# Patient Record
Sex: Female | Born: 1955
Health system: Southern US, Community
[De-identification: ages and names within clinical notes are randomized; demographics above are authoritative.]

## PROBLEM LIST (undated history)

## (undated) DIAGNOSIS — I10 Essential (primary) hypertension: Secondary | ICD-10-CM

## (undated) DIAGNOSIS — I639 Cerebral infarction, unspecified: Secondary | ICD-10-CM

## (undated) DIAGNOSIS — I5032 Chronic diastolic (congestive) heart failure: Secondary | ICD-10-CM

## (undated) DIAGNOSIS — C801 Malignant (primary) neoplasm, unspecified: Secondary | ICD-10-CM

## (undated) DIAGNOSIS — E785 Hyperlipidemia, unspecified: Secondary | ICD-10-CM

## (undated) HISTORY — PX: BREAST LUMPECTOMY: SHX2

## (undated) HISTORY — PX: TONSILLECTOMY: SUR1361

## (undated) HISTORY — DX: Chronic diastolic (congestive) heart failure: I50.32

---

## 2008-01-29 ENCOUNTER — Emergency Department (HOSPITAL_COMMUNITY): Admission: EM | Admit: 2008-01-29 | Discharge: 2008-01-29 | Payer: Self-pay | Admitting: Emergency Medicine

## 2008-11-01 ENCOUNTER — Emergency Department (HOSPITAL_BASED_OUTPATIENT_CLINIC_OR_DEPARTMENT_OTHER): Admission: EM | Admit: 2008-11-01 | Discharge: 2008-11-01 | Payer: Self-pay | Admitting: Emergency Medicine

## 2008-11-11 ENCOUNTER — Emergency Department (HOSPITAL_COMMUNITY): Admission: EM | Admit: 2008-11-11 | Discharge: 2008-11-11 | Payer: Self-pay | Admitting: Emergency Medicine

## 2011-01-11 LAB — URINALYSIS, ROUTINE W REFLEX MICROSCOPIC
Hgb urine dipstick: NEGATIVE
Ketones, ur: NEGATIVE mg/dL
Nitrite: NEGATIVE
Protein, ur: NEGATIVE mg/dL
Protein, ur: NEGATIVE mg/dL
Specific Gravity, Urine: 1.013 (ref 1.005–1.030)
Urobilinogen, UA: 0.2 mg/dL (ref 0.0–1.0)
Urobilinogen, UA: 0.2 mg/dL (ref 0.0–1.0)
pH: 5.5 (ref 5.0–8.0)

## 2011-01-11 LAB — RAPID URINE DRUG SCREEN, HOSP PERFORMED
Amphetamines: NOT DETECTED
Opiates: NOT DETECTED
Tetrahydrocannabinol: NOT DETECTED

## 2011-01-11 LAB — CBC
Hemoglobin: 12.8 g/dL (ref 12.0–15.0)
MCV: 86.3 fL (ref 78.0–100.0)
Platelets: 269 10*3/uL (ref 150–400)
RBC: 4.44 MIL/uL (ref 3.87–5.11)
RDW: 13 % (ref 11.5–15.5)
WBC: 7 10*3/uL (ref 4.0–10.5)
WBC: 7.8 10*3/uL (ref 4.0–10.5)

## 2011-01-11 LAB — BASIC METABOLIC PANEL
BUN: 10 mg/dL (ref 6–23)
Chloride: 99 mEq/L (ref 96–112)
Creatinine, Ser: 0.9 mg/dL (ref 0.4–1.2)
GFR calc non Af Amer: >60 mL/min (ref >60)

## 2011-01-11 LAB — COMPREHENSIVE METABOLIC PANEL
ALT: 18 U/L (ref 0–35)
AST: 21 U/L (ref 0–37)
Alkaline Phosphatase: 83 U/L (ref 39–117)
CO2: 32 mEq/L (ref 19–32)
GFR calc Af Amer: >60 mL/min (ref >60)
GFR calc non Af Amer: >60 mL/min (ref >60)
Glucose, Bld: 214 mg/dL — ABNORMAL HIGH (ref 70–99)
Potassium: 3.7 mEq/L (ref 3.5–5.1)
Sodium: 140 mEq/L (ref 135–145)

## 2011-01-11 LAB — LIPASE, BLOOD: Lipase: 43 U/L (ref 11–59)

## 2011-01-11 LAB — POCT TOXICOLOGY PANEL: Cocaine: POSITIVE

## 2011-01-11 LAB — DIFFERENTIAL
Basophils Absolute: 0 10*3/uL (ref 0.0–0.1)
Basophils Relative: 1 % (ref 0–1)
Eosinophils Absolute: 0.3 10*3/uL (ref 0.0–0.7)
Eosinophils Absolute: 0.5 10*3/uL (ref 0.0–0.7)
Eosinophils Relative: 6 % — ABNORMAL HIGH (ref 0–5)
Lymphs Abs: 2 10*3/uL (ref 0.7–4.0)
Monocytes Relative: 7 % (ref 3–12)
Neutrophils Relative %: 61 % (ref 43–77)
Neutrophils Relative %: 59 % (ref 43–77)

## 2011-01-11 LAB — PREGNANCY, URINE: Preg Test, Ur: NEGATIVE

## 2011-01-11 LAB — ETHANOL: Alcohol, Ethyl (B): <5 mg/dL (ref 0–10)

## 2011-07-06 ENCOUNTER — Emergency Department (HOSPITAL_COMMUNITY)
Admission: EM | Admit: 2011-07-06 | Discharge: 2011-07-06 | Disposition: A | Discharge status: Home / Self Care | Payer: Self-pay | Attending: Emergency Medicine | Admitting: Emergency Medicine

## 2011-07-06 DIAGNOSIS — R5381 Other malaise: Secondary | ICD-10-CM | POA: Insufficient documentation

## 2011-07-06 DIAGNOSIS — E7800 Pure hypercholesterolemia, unspecified: Secondary | ICD-10-CM | POA: Insufficient documentation

## 2011-07-06 DIAGNOSIS — I10 Essential (primary) hypertension: Secondary | ICD-10-CM | POA: Insufficient documentation

## 2011-07-06 DIAGNOSIS — Z794 Long term (current) use of insulin: Secondary | ICD-10-CM | POA: Insufficient documentation

## 2011-07-06 DIAGNOSIS — R5383 Other fatigue: Secondary | ICD-10-CM | POA: Insufficient documentation

## 2011-07-06 DIAGNOSIS — E119 Type 2 diabetes mellitus without complications: Secondary | ICD-10-CM | POA: Insufficient documentation

## 2011-08-21 ENCOUNTER — Encounter: Payer: Self-pay | Admitting: *Deleted

## 2011-08-21 ENCOUNTER — Emergency Department (HOSPITAL_COMMUNITY)
Admission: EM | Admit: 2011-08-21 | Discharge: 2011-08-22 | Disposition: A | Discharge status: Home / Self Care | Payer: BC Managed Care – PPO | Attending: Emergency Medicine | Admitting: Emergency Medicine

## 2011-08-21 ENCOUNTER — Other Ambulatory Visit: Payer: Self-pay

## 2011-08-21 DIAGNOSIS — E785 Hyperlipidemia, unspecified: Secondary | ICD-10-CM | POA: Insufficient documentation

## 2011-08-21 DIAGNOSIS — I10 Essential (primary) hypertension: Secondary | ICD-10-CM | POA: Insufficient documentation

## 2011-08-21 DIAGNOSIS — J45909 Unspecified asthma, uncomplicated: Secondary | ICD-10-CM | POA: Insufficient documentation

## 2011-08-21 DIAGNOSIS — E669 Obesity, unspecified: Secondary | ICD-10-CM | POA: Insufficient documentation

## 2011-08-21 DIAGNOSIS — E119 Type 2 diabetes mellitus without complications: Secondary | ICD-10-CM | POA: Insufficient documentation

## 2011-08-21 DIAGNOSIS — R079 Chest pain, unspecified: Secondary | ICD-10-CM | POA: Insufficient documentation

## 2011-08-21 HISTORY — DX: Hyperlipidemia, unspecified: E78.5

## 2011-08-21 HISTORY — DX: Essential (primary) hypertension: I10

## 2011-08-21 LAB — GLUCOSE, CAPILLARY: Glucose-Capillary: 233 mg/dL — ABNORMAL HIGH (ref 70–99)

## 2011-08-21 MED ORDER — SODIUM CHLORIDE 0.9 % IV SOLN
INTRAVENOUS | Status: DC
  Administered 2011-08-22: via INTRAVENOUS

## 2011-08-21 MED ORDER — LABETALOL HCL 5 MG/ML IV SOLN
20.0000 mg | Freq: Once | INTRAVENOUS | Status: AC
  Administered 2011-08-22: 20 mg via INTRAVENOUS
  Filled 2011-08-21: qty 4

## 2011-08-21 MED ORDER — ONDANSETRON HCL 4 MG/2ML IJ SOLN: 4.0000 mg | Freq: Once | INTRAMUSCULAR | Status: DC

## 2011-08-21 MED ORDER — MORPHINE SULFATE 4 MG/ML IJ SOLN: 4.0000 mg | Freq: Once | INTRAMUSCULAR | Status: DC

## 2011-08-21 NOTE — ED Notes (Signed)
Pt states she was at work today when she began having chest pressure and pricking pain.  Pt states the pain is over her left side.  Pt states her BP at work was 240/115.  Manual BP is 220/76.

## 2011-08-21 NOTE — ED Provider Notes (Signed)
History     CSN: [id number] Arrival date & time: 08/21/2011  9:55 PM   First MD Initiated Contact with Patient 08/21/11 2311      Chief Complaint  Patient presents with  . Chest Pain    (Consider location/radiation/quality/duration/timing/severity/associated sxs/prior treatment) Patient is a 55 y.o. female presenting with chest pain. The history is provided by the patient.  Chest Pain Primary symptoms include dizziness. Pertinent negatives for primary symptoms include no fever, no shortness of breath, no cough and no abdominal pain.  Dizziness does not occur with diaphoresis.  Pertinent negatives for associated symptoms include no diaphoresis and no numbness.    the patient is a 55 year old, female, with a history of hypertension, diabetes, and hypercholesterolemia, who presents to the emergency department stating she felt dizzy and nauseated.  She also has had back pain, which p.m. on the right side and extended bilaterally for the past several days.  She denies trauma.  She denies vomiting, fevers, chills, or urinary tract symptoms.  She has had polyuria.  She denies chest pain, shortness of breath, cough, or difficulty breathing.  She denies smoking.  She had a nurse check her blood pressure, and she noted that it was severely elevated, so she came to the emergency department for further evaluation.  Past Medical History  Diagnosis Date  . Hypertension   . Diabetes mellitus   . Hyperlipidemia   . Asthma     Past Surgical History  Procedure Date  . Tonsillectomy   . Breast lumpectomy     Family History  Problem Relation Age of Onset  . Cancer Mother   . Diabetes Mother   . Hyperlipidemia Mother   . Hypertension Mother   . Rheum arthritis Father   . Asthma Father   . Stroke Father   . Thyroid disease Father   . Thyroid disease Brother     History  Substance Use Topics  . Smoking status: Not on file  . Smokeless tobacco: Not on file  . Alcohol Use: No    OB  History    Grav Para Term Preterm Abortions TAB SAB Ect Mult Living                  Review of Systems  Constitutional: Negative for fever, chills and diaphoresis.  HENT: Negative for congestion and neck pain.   Eyes: Negative for redness and visual disturbance.  Respiratory: Negative for cough, chest tightness and shortness of breath.   Cardiovascular: Negative for chest pain.  Gastrointestinal: Negative for abdominal pain.  Genitourinary: Positive for frequency and flank pain. Negative for dysuria and decreased urine volume.  Musculoskeletal: Positive for back pain.  Skin: Negative for rash.  Neurological: Positive for dizziness. Negative for light-headedness, numbness and headaches.  Psychiatric/Behavioral: Negative for confusion.    Allergies  Review of patient's allergies indicates no known allergies.  Home Medications   Current Outpatient Rx  Name Route Sig Dispense Refill  . ATORVASTATIN CALCIUM 10 MG PO TABS Oral Take 10 mg by mouth daily.      Marland Kitchen DILTIAZEM HCL 100 MG IV SOLR Oral Take 120 mg by mouth daily.      Marland Kitchen ESCITALOPRAM OXALATE 10 MG PO TABS Oral Take 10 mg by mouth daily.      Marland Kitchen METOPROLOL TARTRATE 100 MG PO TABS Oral Take 100 mg by mouth 2 (two) times daily.        BP 215/83  Pulse 92  Temp(Src) 98 F (36.7 C) (Oral)  Resp 22  SpO2 100%  Physical Exam  Constitutional: She is oriented to person, place, and time. She appears well-developed and well-nourished.       Obese in a moderate amount of discomfort  HENT:  Head: Normocephalic and atraumatic.  Eyes: Conjunctivae are normal. Pupils are equal, round, and reactive to light.  Neck: Normal range of motion.  Cardiovascular: Normal rate, regular rhythm and normal heart sounds.   No murmur heard. Pulmonary/Chest: Effort normal and breath sounds normal. No respiratory distress. She has no wheezes. She has no rales.  Abdominal: Soft. She exhibits no distension and no mass. There is no tenderness. There is  no rebound and no guarding.       Right flank tenderness  Musculoskeletal: Normal range of motion. She exhibits no edema and no tenderness.  Neurological: She is alert and oriented to person, place, and time. No cranial nerve deficit.  Skin: Skin is warm and dry. No rash noted. No erythema.  Psychiatric: She has a normal mood and affect. Her behavior is normal.    ED Course  Procedures (including critical care time)  55 year old, female, with hypertension, diabetes, and hypercholesterolemia.  Complains of right flank pain, extending across her back for several days.  She is severely elevated blood pressure.  We will check a urinalysis, and blood chemistry, and give her IV medication to reduce her blood pressure since her systolic pressure is greater than 200.  Labs Reviewed  GLUCOSE, CAPILLARY - Abnormal; Notable for the following:    Glucose-Capillary 233 (*)    All other components within normal limits  POCT CBG MONITORING  BASIC METABOLIC PANEL  URINALYSIS, ROUTINE W REFLEX MICROSCOPIC     ED ECG REPORT   Date: 08/21/11 EKG Time: 22:19 Rate: 86  Rhythm: normal sinus rhythm,  Axis: nl  Intervals: prolonged QT  ST&T Change: none  Narrative Interpretation: nsr with prolonged qt             MDM  Hypertension        Nicholes Stairs, MD 08/22/11 0121

## 2011-08-22 LAB — URINALYSIS, ROUTINE W REFLEX MICROSCOPIC
Ketones, ur: NEGATIVE mg/dL
Nitrite: NEGATIVE
Protein, ur: NEGATIVE mg/dL
Urobilinogen, UA: 0.2 mg/dL (ref 0.0–1.0)

## 2011-08-22 LAB — BASIC METABOLIC PANEL
CO2: 31 mEq/L (ref 19–32)
Calcium: 9.9 mg/dL (ref 8.4–10.5)
Creatinine, Ser: 0.65 mg/dL (ref 0.50–1.10)
GFR calc Af Amer: >90 mL/min (ref >90)
GFR calc non Af Amer: >90 mL/min (ref >90)
Sodium: 141 mEq/L (ref 135–145)

## 2011-08-22 LAB — URINE MICROSCOPIC-ADD ON

## 2011-08-22 MED ORDER — ACETAMINOPHEN 325 MG PO TABS
650.0000 mg | ORAL_TABLET | Freq: Once | ORAL | Status: AC
  Administered 2011-08-22: 650 mg via ORAL
  Filled 2011-08-22: qty 2

## 2011-08-22 MED ORDER — LISINOPRIL 10 MG PO TABS
10.0000 mg | ORAL_TABLET | Freq: Once | ORAL | Status: AC
  Administered 2011-08-22: 10 mg via ORAL
  Filled 2011-08-22: qty 1

## 2011-08-22 MED ORDER — LISINOPRIL 20 MG PO TABS: 10.0000 mg | ORAL_TABLET | Freq: Every day | ORAL | Status: DC

## 2011-08-22 NOTE — ED Notes (Signed)
Patient denies pain and is resting comfortably.  

## 2011-08-22 NOTE — ED Notes (Signed)
-   refused MORPHINE & ZOFRAN @ present.     MD aware.

## 2011-08-22 NOTE — ED Notes (Signed)
MD at bedside. 

## 2011-08-22 NOTE — ED Notes (Signed)
Patient is resting comfortably. 

## 2012-07-29 ENCOUNTER — Emergency Department (HOSPITAL_COMMUNITY)
Admission: EM | Admit: 2012-07-29 | Discharge: 2012-07-29 | Disposition: A | Discharge status: Home / Self Care | Payer: BC Managed Care – PPO | Attending: Emergency Medicine | Admitting: Emergency Medicine

## 2012-07-29 ENCOUNTER — Encounter (HOSPITAL_COMMUNITY): Payer: Self-pay | Admitting: Emergency Medicine

## 2012-07-29 ENCOUNTER — Emergency Department (HOSPITAL_COMMUNITY): Payer: BC Managed Care – PPO

## 2012-07-29 DIAGNOSIS — S0003XA Contusion of scalp, initial encounter: Secondary | ICD-10-CM | POA: Insufficient documentation

## 2012-07-29 DIAGNOSIS — E785 Hyperlipidemia, unspecified: Secondary | ICD-10-CM | POA: Insufficient documentation

## 2012-07-29 DIAGNOSIS — Z79899 Other long term (current) drug therapy: Secondary | ICD-10-CM | POA: Insufficient documentation

## 2012-07-29 DIAGNOSIS — S0510XA Contusion of eyeball and orbital tissues, unspecified eye, initial encounter: Secondary | ICD-10-CM | POA: Insufficient documentation

## 2012-07-29 DIAGNOSIS — S0083XA Contusion of other part of head, initial encounter: Secondary | ICD-10-CM

## 2012-07-29 DIAGNOSIS — I10 Essential (primary) hypertension: Secondary | ICD-10-CM | POA: Insufficient documentation

## 2012-07-29 DIAGNOSIS — E119 Type 2 diabetes mellitus without complications: Secondary | ICD-10-CM | POA: Insufficient documentation

## 2012-07-29 DIAGNOSIS — J45909 Unspecified asthma, uncomplicated: Secondary | ICD-10-CM | POA: Insufficient documentation

## 2012-07-29 DIAGNOSIS — S0590XA Unspecified injury of unspecified eye and orbit, initial encounter: Secondary | ICD-10-CM

## 2012-07-29 MED ORDER — PROPARACAINE HCL 0.5 % OP SOLN
OPHTHALMIC | Status: AC
  Administered 2012-07-29: 12:00:00
  Filled 2012-07-29: qty 15

## 2012-07-29 MED ORDER — TETRACAINE HCL 0.5 % OP SOLN: 1.0000 [drp] | Freq: Once | OPHTHALMIC | Status: DC

## 2012-07-29 MED ORDER — HYDROCODONE-ACETAMINOPHEN 5-325 MG PO TABS: 2.0000 | ORAL_TABLET | ORAL | Status: DC | PRN

## 2012-07-29 MED ORDER — FLUORESCEIN SODIUM 1 MG OP STRP
ORAL_STRIP | OPHTHALMIC | Status: AC
  Administered 2012-07-29: 12:00:00
  Filled 2012-07-29: qty 1

## 2012-07-29 NOTE — ED Notes (Signed)
Returned from CT.
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
## 2012-07-29 NOTE — ED Provider Notes (Signed)
Medical screening examination/treatment/procedure(s) were performed by non-physician practitioner and as supervising physician I was immediately available for consultation/collaboration.   Shelda Jakes, MD 07/29/12 [phone]

## 2012-08-15 ENCOUNTER — Encounter (INDEPENDENT_AMBULATORY_CARE_PROVIDER_SITE_OTHER): Payer: BC Managed Care – PPO | Admitting: Ophthalmology

## 2012-10-08 ENCOUNTER — Emergency Department (HOSPITAL_COMMUNITY): Payer: BC Managed Care – PPO

## 2012-10-08 ENCOUNTER — Emergency Department (HOSPITAL_COMMUNITY)
Admission: EM | Admit: 2012-10-08 | Discharge: 2012-10-08 | Disposition: A | Discharge status: Home / Self Care | Payer: BC Managed Care – PPO | Attending: Emergency Medicine | Admitting: Emergency Medicine

## 2012-10-08 ENCOUNTER — Encounter (HOSPITAL_COMMUNITY): Payer: Self-pay | Admitting: Emergency Medicine

## 2012-10-08 DIAGNOSIS — R079 Chest pain, unspecified: Secondary | ICD-10-CM

## 2012-10-08 DIAGNOSIS — J45909 Unspecified asthma, uncomplicated: Secondary | ICD-10-CM | POA: Insufficient documentation

## 2012-10-08 DIAGNOSIS — I10 Essential (primary) hypertension: Secondary | ICD-10-CM | POA: Insufficient documentation

## 2012-10-08 DIAGNOSIS — R0789 Other chest pain: Secondary | ICD-10-CM | POA: Insufficient documentation

## 2012-10-08 DIAGNOSIS — E785 Hyperlipidemia, unspecified: Secondary | ICD-10-CM | POA: Insufficient documentation

## 2012-10-08 DIAGNOSIS — E119 Type 2 diabetes mellitus without complications: Secondary | ICD-10-CM | POA: Insufficient documentation

## 2012-10-08 DIAGNOSIS — Z794 Long term (current) use of insulin: Secondary | ICD-10-CM | POA: Insufficient documentation

## 2012-10-08 DIAGNOSIS — Z79899 Other long term (current) drug therapy: Secondary | ICD-10-CM | POA: Insufficient documentation

## 2012-10-08 LAB — CBC WITH DIFFERENTIAL/PLATELET
Basophils Absolute: 0.1 10*3/uL (ref 0.0–0.1)
Basophils Relative: 1 % (ref 0–1)
Eosinophils Absolute: 0.7 10*3/uL (ref 0.0–0.7)
Hemoglobin: 13.9 g/dL (ref 12.0–15.0)
MCH: 28.5 pg (ref 26.0–34.0)
MCHC: 32.7 g/dL (ref 30.0–36.0)
Neutro Abs: 6.7 10*3/uL (ref 1.7–7.7)
Neutrophils Relative %: 65 % (ref 43–77)
Platelets: 322 10*3/uL (ref 150–400)

## 2012-10-08 LAB — COMPREHENSIVE METABOLIC PANEL
ALT: 9 U/L (ref 0–35)
AST: 13 U/L (ref 0–37)
Albumin: 3.8 g/dL (ref 3.5–5.2)
Alkaline Phosphatase: 102 U/L (ref 39–117)
Chloride: 96 mEq/L (ref 96–112)
Potassium: 3.5 mEq/L (ref 3.5–5.1)
Sodium: 138 mEq/L (ref 135–145)
Total Bilirubin: 0.3 mg/dL (ref 0.3–1.2)
Total Protein: 7.8 g/dL (ref 6.0–8.3)

## 2012-10-08 LAB — RAPID URINE DRUG SCREEN, HOSP PERFORMED
Amphetamines: NOT DETECTED
Barbiturates: NOT DETECTED
Benzodiazepines: NOT DETECTED
Cocaine: NOT DETECTED
Tetrahydrocannabinol: NOT DETECTED

## 2012-10-08 LAB — TROPONIN I
Troponin I: <0.3 ng/mL (ref <0.30)
Troponin I: <0.3 ng/mL (ref <0.30)

## 2012-10-08 MED ORDER — IBUPROFEN 800 MG PO TABS: 800.0000 mg | ORAL_TABLET | Freq: Three times a day (TID) | ORAL | Status: DC

## 2012-10-08 MED ORDER — KETOROLAC TROMETHAMINE 30 MG/ML IJ SOLN
30.0000 mg | Freq: Once | INTRAMUSCULAR | Status: AC
  Administered 2012-10-08: 30 mg via INTRAVENOUS
  Filled 2012-10-08: qty 1

## 2012-10-08 NOTE — ED Notes (Signed)
Pt states started having R sided chest pain radiating to back, has worsened over the past couple days, states hurts to move or breath deep now, denies injury or muscle strain, states went to urgent care and was sent here to r/o PE. Denies shortness of breath.

## 2012-10-08 NOTE — ED Notes (Signed)
Pt reports pain on right side and mid back that is 10/10 at times. Pt reports that it hurts worse when she takes a deep breath and "when I move certain ways". Pt denies fall or recent injury.

## 2012-10-08 NOTE — ED Provider Notes (Signed)
History     CSN: [id number]  Arrival date & time 10/08/12  1408   First MD Initiated Contact with Patient 10/08/12 1614      No chief complaint on file.   (Consider location/radiation/quality/duration/timing/severity/associated sxs/prior treatment) The history is provided by the patient.  Carrina Schoenberger is a 57 y.o. female history of hypertension, diabetes here presenting with right-sided chest pain. Sharp intermittent chest pain for the last 3 days. It is worse with movement and worse when she takes a deep breath but not exertional. 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 No recent travel history of DVT and she is not on oral contraceptives. She went to urgent care was sent for rule out PE. She does use cocaine occasionally and last use was a week ago but she denies any smoking.    Past Medical History  Diagnosis Date  . Hypertension   . Diabetes mellitus   . Hyperlipidemia   . Asthma     Past Surgical History  Procedure Date  . Tonsillectomy   . Breast lumpectomy     Family History  Problem Relation Age of Onset  . Cancer Mother   . Diabetes Mother   . Hyperlipidemia Mother   . Hypertension Mother   . Rheum arthritis Father   . Asthma Father   . Stroke Father   . Thyroid disease Father   . Thyroid disease Brother     History  Substance Use Topics  . Smoking status: Not on file  . Smokeless tobacco: Not on file  . Alcohol Use: No    OB History    Grav Para Term Preterm Abortions TAB SAB Ect Mult Living                  Review of Systems  Cardiovascular: Positive for chest pain.  All other systems reviewed and are negative.    Allergies  Shrimp  Home Medications   Current Outpatient Rx  Name  Route  Sig  Dispense  Refill  . ALBUTEROL SULFATE HFA 108 (90 BASE) MCG/ACT IN AERS   Inhalation   Inhale 2 puffs into the lungs every 6 (six) hours as needed. Shortness of breath         . CITALOPRAM HYDROBROMIDE 40 MG PO TABS   Oral   Take 40 mg by mouth daily.         Marland Kitchen DILTIAZEM HCL ER 240 MG PO CP24   Oral   Take 240 mg by mouth daily.         . INSULIN ASPART 100 UNIT/ML [place] SOLN   Subcutaneous   Inject 15 Units into the skin 2 (two) times daily.         Marland Kitchen LOSARTAN POTASSIUM-HCTZ 100-25 MG PO TABS   Oral   Take 1 tablet by mouth daily.         Marland Kitchen METFORMIN HCL 500 MG PO TABS   Oral   Take 500 mg by mouth 2 (two) times daily with a meal. Patient is currently  Out of this medication            BP 169/77  Pulse 69  Temp 98.8 F (37.1 C) (Oral)  Resp 16  SpO2 98%  Physical Exam  Nursing note and vitals reviewed. Constitutional: She is oriented to person, place, and time. She appears well-developed and well-nourished.       Slightly uncomfortable   HENT:  Head: Normocephalic.  Mouth/Throat: Oropharynx is clear  and moist.  Eyes: Conjunctivae normal are normal. Pupils are equal, round, and reactive to light.  Neck: Normal range of motion. Neck supple.  Cardiovascular: Normal rate, regular rhythm and normal heart sounds.   Pulmonary/Chest: Effort normal and breath sounds normal. No respiratory distress. She has no wheezes. She has no rales.  Abdominal: Soft. Bowel sounds are normal. She exhibits no distension. There is no tenderness. There is no rebound.  Musculoskeletal: Normal range of motion. She exhibits no edema and no tenderness.  Neurological: She is alert and oriented to person, place, and time.  Skin: Skin is warm and dry.  Psychiatric: She has a normal mood and affect. Her behavior is normal. Judgment and thought content normal.    ED Course  Procedures (including critical care time)  Labs Reviewed  CBC WITH DIFFERENTIAL - Abnormal; Notable for the following:    Eosinophils Relative 7 (*)     All other components within normal limits  COMPREHENSIVE METABOLIC PANEL - Abnormal; Notable for the following:    Glucose, Bld 125 (*)     GFR calc non Af Amer 76 (*)     GFR calc  Af Amer 88 (*)     All other components within normal limits  TROPONIN I  D-DIMER, QUANTITATIVE  URINE RAPID DRUG SCREEN (HOSP PERFORMED)  TROPONIN I   Dg Chest 2 View  10/08/2012  *RADIOLOGY REPORT*  Clinical Data: Chest pain, shortness of breath  CHEST - 2 VIEW  Comparison: 01/29/2008  Findings: Apparent minimal increase in size of cardiac silhouette and mediastinal contours is favored to be secondary to slightly decreased lung volumes.  No focal parenchymal opacity.  No pleural effusion or pneumothorax.  Unchanged bones.  IMPRESSION: No acute cardiopulmonary disease.   Original Report Authenticated By: Tacey Ruiz, MD      No diagnosis found.   Date: 10/08/2012  Rate:77  Rhythm: normal sinus rhythm  QRS Axis: normal  Intervals: normal  ST/T Wave abnormalities: nonspecific ST changes  Conduction Disutrbances:none  Narrative Interpretation:   Old EKG Reviewed: unchanged    MDM  Elle Vezina is a 57 y.o. female here with R sided chest pain. Low risk for PE but it is pleuritic so will get d-dimer. Pain unlikely ACS but given hx of cocaine will get trop x 2. I think most likely her pain is MSK in origin.   8:43 PM Pain improved with toradol. D-dimer neg. Trop neg x 2. CXR unremarkable. Will d/c home on motrin 800mg  every 6 hrs prn. Will recommend outpatient f/u.         Richardean Canal, MD 10/08/12 2044

## 2012-12-14 ENCOUNTER — Emergency Department (HOSPITAL_COMMUNITY)
Admission: EM | Admit: 2012-12-14 | Discharge: 2012-12-14 | Disposition: A | Discharge status: Home / Self Care | Payer: BC Managed Care – PPO | Attending: Emergency Medicine | Admitting: Emergency Medicine

## 2012-12-14 ENCOUNTER — Emergency Department (HOSPITAL_COMMUNITY): Payer: BC Managed Care – PPO

## 2012-12-14 ENCOUNTER — Encounter (HOSPITAL_COMMUNITY): Payer: Self-pay | Admitting: *Deleted

## 2012-12-14 DIAGNOSIS — Z8639 Personal history of other endocrine, nutritional and metabolic disease: Secondary | ICD-10-CM | POA: Insufficient documentation

## 2012-12-14 DIAGNOSIS — L02619 Cutaneous abscess of unspecified foot: Secondary | ICD-10-CM | POA: Insufficient documentation

## 2012-12-14 DIAGNOSIS — J45909 Unspecified asthma, uncomplicated: Secondary | ICD-10-CM | POA: Insufficient documentation

## 2012-12-14 DIAGNOSIS — Z862 Personal history of diseases of the blood and blood-forming organs and certain disorders involving the immune mechanism: Secondary | ICD-10-CM | POA: Insufficient documentation

## 2012-12-14 DIAGNOSIS — L0390 Cellulitis, unspecified: Secondary | ICD-10-CM

## 2012-12-14 DIAGNOSIS — Z79899 Other long term (current) drug therapy: Secondary | ICD-10-CM | POA: Insufficient documentation

## 2012-12-14 DIAGNOSIS — I10 Essential (primary) hypertension: Secondary | ICD-10-CM | POA: Insufficient documentation

## 2012-12-14 DIAGNOSIS — Z794 Long term (current) use of insulin: Secondary | ICD-10-CM | POA: Insufficient documentation

## 2012-12-14 DIAGNOSIS — L299 Pruritus, unspecified: Secondary | ICD-10-CM | POA: Insufficient documentation

## 2012-12-14 DIAGNOSIS — E119 Type 2 diabetes mellitus without complications: Secondary | ICD-10-CM | POA: Insufficient documentation

## 2012-12-14 LAB — URINALYSIS, ROUTINE W REFLEX MICROSCOPIC
Ketones, ur: NEGATIVE mg/dL
Leukocytes, UA: NEGATIVE
Nitrite: NEGATIVE
Protein, ur: NEGATIVE mg/dL
Urobilinogen, UA: 0.2 mg/dL (ref 0.0–1.0)

## 2012-12-14 LAB — CBC
MCH: 28.6 pg (ref 26.0–34.0)
MCHC: 32.4 g/dL (ref 30.0–36.0)
Platelets: 279 10*3/uL (ref 150–400)
RBC: 4.51 MIL/uL (ref 3.87–5.11)
RDW: 12.9 % (ref 11.5–15.5)

## 2012-12-14 LAB — BASIC METABOLIC PANEL
CO2: 30 mEq/L (ref 19–32)
Calcium: 9.4 mg/dL (ref 8.4–10.5)
GFR calc Af Amer: >90 mL/min (ref >90)
GFR calc non Af Amer: >90 mL/min (ref >90)
Sodium: 140 mEq/L (ref 135–145)

## 2012-12-14 LAB — GLUCOSE, CAPILLARY: Glucose-Capillary: 93 mg/dL (ref 70–99)

## 2012-12-14 MED ORDER — CLINDAMYCIN HCL 150 MG PO CAPS: 150.0000 mg | ORAL_CAPSULE | Freq: Four times a day (QID) | ORAL | Status: DC

## 2012-12-14 MED ORDER — VANCOMYCIN HCL IN DEXTROSE 1-5 GM/200ML-% IV SOLN
1000.0000 mg | Freq: Once | INTRAVENOUS | Status: AC
  Administered 2012-12-14: 1000 mg via INTRAVENOUS
  Filled 2012-12-14: qty 200

## 2012-12-14 NOTE — ED Notes (Signed)
CBG 93, pt given sandwich and orange juice.

## 2012-12-14 NOTE — ED Provider Notes (Signed)
History     CSN: [id number]  Arrival date & time 12/14/12  0149   First MD Initiated Contact with Patient 12/14/12 [phone]      Chief Complaint  Patient presents with  . Numbness    (Consider location/radiation/quality/duration/timing/severity/associated sxs/prior treatment) HPI Hx per PT< at work tonight, both feet have been itchy and her R foot with itching developed swelling and redness and warmth and some numbness.  She has DM but has never been told she has neuropathy. No foot wounds or ulcers, her blood sugars typically run in the 300s, tonight in the 60-80s which is unusual for her, no change in diet, no fevers, takes BP medications as prescribed. No HA, no weakness. No h/o same. No trouble ambulating.  Past Medical History  Diagnosis Date  . Hypertension   . Diabetes mellitus   . Hyperlipidemia   . Asthma     Past Surgical History  Procedure Laterality Date  . Tonsillectomy    . Breast lumpectomy      Family History  Problem Relation Age of Onset  . Cancer Mother   . Diabetes Mother   . Hyperlipidemia Mother   . Hypertension Mother   . Rheum arthritis Father   . Asthma Father   . Stroke Father   . Thyroid disease Father   . Thyroid disease Brother     History  Substance Use Topics  . Smoking status: Not on file  . Smokeless tobacco: Not on file  . Alcohol Use: No    OB History   Grav Para Term Preterm Abortions TAB SAB Ect Mult Living                  Review of Systems  Constitutional: Negative for fever and chills.  HENT: Negative for neck pain and neck stiffness.   Eyes: Negative for photophobia, pain and visual disturbance.  Respiratory: Negative for shortness of breath.   Cardiovascular: Negative for chest pain.  Gastrointestinal: Negative for nausea, vomiting and abdominal pain.  Genitourinary: Negative for dysuria.  Musculoskeletal: Negative for back pain.  Skin: Negative for rash.  Neurological: Negative for headaches.  All other systems  reviewed and are negative.    Allergies  Shrimp  Home Medications   Current Outpatient Rx  Name  Route  Sig  Dispense  Refill  . albuterol (PROVENTIL HFA;VENTOLIN HFA) 108 (90 BASE) MCG/ACT inhaler   Inhalation   Inhale 2 puffs into the lungs every 6 (six) hours as needed. Shortness of breath         . citalopram (CELEXA) 40 MG tablet   Oral   Take 40 mg by mouth daily.         Marland Kitchen diltiazem (DILACOR XR) 240 MG 24 hr capsule   Oral   Take 240 mg by mouth daily.         Marland Kitchen ibuprofen (ADVIL,MOTRIN) 800 MG tablet   Oral   Take 1 tablet (800 mg total) by mouth 3 (three) times daily.   30 tablet   0   . insulin aspart (NOVOLOG) 100 UNIT/ML injection   Subcutaneous   Inject 15 Units into the skin 2 (two) times daily.         Marland Kitchen losartan-hydrochlorothiazide (HYZAAR) 100-25 MG per tablet   Oral   Take 1 tablet by mouth daily.         . metFORMIN (GLUCOPHAGE) 500 MG tablet   Oral   Take 500 mg by mouth 2 (two) times daily with  a meal. Patient is currently  Out of this medication            BP 166/75  Pulse 78  Temp(Src) 97.8 F (36.6 C) (Oral)  Resp 24  SpO2 95%  Physical Exam  Constitutional: She is oriented to person, place, and time. She appears well-developed and well-nourished.  HENT:  Head: Normocephalic and atraumatic.  Mouth/Throat: Oropharynx is clear and moist.  Eyes: EOM are normal. Pupils are equal, round, and reactive to light.  Neck: Neck supple.  Cardiovascular: Normal rate, regular rhythm and intact distal pulses.   Pulmonary/Chest: Effort normal and breath sounds normal. No respiratory distress.  Abdominal: Soft. Bowel sounds are normal. There is no tenderness.  Musculoskeletal: Normal range of motion.  L foot dorsal aspect with swelling, erythema and inc warmth to touch. No weakness, equal dpp, sensorium to light touch intact and equal to LEs.   Neurological: She is alert and oriented to person, place, and time. She displays normal  reflexes. No cranial nerve deficit. She exhibits normal muscle tone. Coordination normal.  Skin: Skin is warm and dry.    ED Course  Procedures (including critical care time)  Results for orders placed during the hospital encounter of 12/14/12  CBC      Result Value Range   WBC 10.0  4.0 - 10.5 K/uL   RBC 4.51  3.87 - 5.11 MIL/uL   Hemoglobin 12.9  12.0 - 15.0 g/dL   HCT 16.1  09.6 - 04.5 %   MCV 88.2  78.0 - 100.0 fL   MCH 28.6  26.0 - 34.0 pg   MCHC 32.4  30.0 - 36.0 g/dL   RDW 40.9  81.1 - 91.4 %   Platelets 279  150 - 400 K/uL  BASIC METABOLIC PANEL      Result Value Range   Sodium 140  135 - 145 mEq/L   Potassium 3.7  3.5 - 5.1 mEq/L   Chloride 101  96 - 112 mEq/L   CO2 30  19 - 32 mEq/L   Glucose, Bld 87  70 - 99 mg/dL   BUN 8  6 - 23 mg/dL   Creatinine, Ser 7.82  0.50 - 1.10 mg/dL   Calcium 9.4  8.4 - 95.6 mg/dL   GFR calc non Af Amer >90  >90 mL/min   GFR calc Af Amer >90  >90 mL/min  URINALYSIS, ROUTINE W REFLEX MICROSCOPIC      Result Value Range   Color, Urine YELLOW  YELLOW   APPearance CLEAR  CLEAR   Specific Gravity, Urine 1.003 (*) 1.005 - 1.030   pH 7.5  5.0 - 8.0   Glucose, UA NEGATIVE  NEGATIVE mg/dL   Hgb urine dipstick NEGATIVE  NEGATIVE   Bilirubin Urine NEGATIVE  NEGATIVE   Ketones, ur NEGATIVE  NEGATIVE mg/dL   Protein, ur NEGATIVE  NEGATIVE mg/dL   Urobilinogen, UA 0.2  0.0 - 1.0 mg/dL   Nitrite NEGATIVE  NEGATIVE   Leukocytes, UA NEGATIVE  NEGATIVE  GLUCOSE, CAPILLARY      Result Value Range   Glucose-Capillary 93  70 - 99 mg/dL  POCT I-STAT TROPONIN I      Result Value Range   Troponin i, poc 0.01  0.00 - 0.08 ng/mL   Comment 3            Ct Head Wo Contrast  12/14/2012  *RADIOLOGY REPORT*  Clinical Data: Left leg swelling.  Left leg feels sore and numb.  CT HEAD  WITHOUT CONTRAST  Technique:  Contiguous axial images were obtained from the base of the skull through the vertex without contrast.  Comparison: 07/29/2012  Findings: The  ventricles and sulci are symmetrical without significant effacement, displacement, or dilatation. No mass effect or midline shift. No abnormal extra-axial fluid collections. The grey-white matter junction is distinct. Basal cisterns are not effaced. No acute intracranial hemorrhage. No depressed skull fractures.  No significant change in the appearance intracranial contents since previous study.  There is increasing opacification and membrane thickening in the maxillary antra, ethmoid air cells, and sphenoid sinuses since previous study.  Mastoid air cells are not opacified.  IMPRESSION: No acute intracranial abnormalities.  Increasing inflammatory changes in the paranasal sinuses.   Original Report Authenticated By: Burman Nieves, M.D.    IV ABx  7:16 AM PT feeling well and feels comfortable for d/c home plan f/u 48 hours recheck cellulitis L foot. Normal neuro exam.   MDM  L foot redness, pain and swelling  Treated for cellulitis  - no leukocytosis or fevers  VS and nursing notes reviewed      Sunnie Nielsen, MD 12/14/12 [phone]

## 2012-12-14 NOTE — ED Notes (Signed)
Pt states was bending down at work and when she stood up felt like bp elevated; developed left leg swelling; states lower left leg feels sore and numb--"feels like leg going to sleep"; strong left pedal pulse noted; states last wk both hands numb; states hands still feel tingling;

## 2012-12-15 ENCOUNTER — Emergency Department (HOSPITAL_BASED_OUTPATIENT_CLINIC_OR_DEPARTMENT_OTHER)
Admission: EM | Admit: 2012-12-15 | Discharge: 2012-12-15 | Disposition: A | Discharge status: Other Acute Inpatient Hospital | Payer: BC Managed Care – PPO | Attending: Emergency Medicine | Admitting: Emergency Medicine

## 2012-12-15 ENCOUNTER — Encounter (HOSPITAL_BASED_OUTPATIENT_CLINIC_OR_DEPARTMENT_OTHER): Payer: Self-pay | Admitting: *Deleted

## 2012-12-15 DIAGNOSIS — L02619 Cutaneous abscess of unspecified foot: Secondary | ICD-10-CM | POA: Insufficient documentation

## 2012-12-15 DIAGNOSIS — L0390 Cellulitis, unspecified: Secondary | ICD-10-CM

## 2012-12-15 DIAGNOSIS — E119 Type 2 diabetes mellitus without complications: Secondary | ICD-10-CM | POA: Insufficient documentation

## 2012-12-15 DIAGNOSIS — Z8639 Personal history of other endocrine, nutritional and metabolic disease: Secondary | ICD-10-CM | POA: Insufficient documentation

## 2012-12-15 DIAGNOSIS — Z794 Long term (current) use of insulin: Secondary | ICD-10-CM | POA: Insufficient documentation

## 2012-12-15 DIAGNOSIS — Z862 Personal history of diseases of the blood and blood-forming organs and certain disorders involving the immune mechanism: Secondary | ICD-10-CM | POA: Insufficient documentation

## 2012-12-15 DIAGNOSIS — J45909 Unspecified asthma, uncomplicated: Secondary | ICD-10-CM | POA: Insufficient documentation

## 2012-12-15 DIAGNOSIS — Z23 Encounter for immunization: Secondary | ICD-10-CM | POA: Insufficient documentation

## 2012-12-15 DIAGNOSIS — Z79899 Other long term (current) drug therapy: Secondary | ICD-10-CM | POA: Insufficient documentation

## 2012-12-15 DIAGNOSIS — I10 Essential (primary) hypertension: Secondary | ICD-10-CM | POA: Insufficient documentation

## 2012-12-15 LAB — CBC WITH DIFFERENTIAL/PLATELET
Basophils Absolute: 0 10*3/uL (ref 0.0–0.1)
Eosinophils Absolute: 0.5 10*3/uL (ref 0.0–0.7)
Eosinophils Relative: 6 % — ABNORMAL HIGH (ref 0–5)
HCT: 40.4 % (ref 36.0–46.0)
MCH: 28.8 pg (ref 26.0–34.0)
MCV: 89.4 fL (ref 78.0–100.0)
Monocytes Absolute: 0.7 10*3/uL (ref 0.1–1.0)
Platelets: 290 10*3/uL (ref 150–400)
RDW: 13 % (ref 11.5–15.5)

## 2012-12-15 LAB — BASIC METABOLIC PANEL
CO2: 30 mEq/L (ref 19–32)
Calcium: 9.2 mg/dL (ref 8.4–10.5)
Creatinine, Ser: 0.7 mg/dL (ref 0.50–1.10)
GFR calc non Af Amer: >90 mL/min (ref >90)
Glucose, Bld: 191 mg/dL — ABNORMAL HIGH (ref 70–99)
Sodium: 141 mEq/L (ref 135–145)

## 2012-12-15 MED ORDER — VANCOMYCIN HCL IN DEXTROSE 1-5 GM/200ML-% IV SOLN
1000.0000 mg | Freq: Once | INTRAVENOUS | Status: AC
  Administered 2012-12-15: 1000 mg via INTRAVENOUS
  Filled 2012-12-15: qty 200

## 2012-12-15 MED ORDER — TETANUS-DIPHTH-ACELL PERTUSSIS 5-2.5-18.5 LF-MCG/0.5 IM SUSP
0.5000 mL | Freq: Once | INTRAMUSCULAR | Status: AC
  Administered 2012-12-15: 0.5 mL via INTRAMUSCULAR
  Filled 2012-12-15: qty 0.5

## 2012-12-15 NOTE — ED Notes (Signed)
Pt seen at [facility] and dx'd with cellulitis. Foot was marked and pt told to return to ED if redness goes above line.

## 2012-12-15 NOTE — ED Notes (Signed)
All LDA's (lines, drains, airways, and wounds) were removed from documentation for discharge purposes.  At time of discharge all LDA's remained intact as were previously documented.    

## 2012-12-15 NOTE — ED Provider Notes (Signed)
History     CSN: [id number]  Arrival date & time 12/15/12  1328   First MD Initiated Contact with Patient 12/15/12 1353      Chief Complaint  Patient presents with  . Foot Pain    (Consider location/radiation/quality/duration/timing/severity/associated sxs/prior treatment) HPI Comments: Patient presents with worsening redness of her left foot. She was seen at [facility] long ED yesterday and was diagnosed with a foot cellulitis. She states it started the day before her with some itching of her foot and then it progressively got more swollen and red. She was given a dose of IV vancomycin in the emergency department yesterday and was started on clindamycin. She states that since then the redness has spread past the demarcated lines. She denies any fevers or chills. She does have a history of diabetes. She denies any past episodes of cellulitis. She does say that the foot is still itchy but is very sensitive to the touch as well.  Patient is a 57 y.o. female presenting with lower extremity pain.  Foot Pain Pertinent negatives include no chest pain, no abdominal pain, no headaches and no shortness of breath.    Past Medical History  Diagnosis Date  . Hypertension   . Diabetes mellitus   . Hyperlipidemia   . Asthma     Past Surgical History  Procedure Laterality Date  . Tonsillectomy    . Breast lumpectomy      Family History  Problem Relation Age of Onset  . Cancer Mother   . Diabetes Mother   . Hyperlipidemia Mother   . Hypertension Mother   . Rheum arthritis Father   . Asthma Father   . Stroke Father   . Thyroid disease Father   . Thyroid disease Brother     History  Substance Use Topics  . Smoking status: Not on file  . Smokeless tobacco: Not on file  . Alcohol Use: No    OB History   Grav Para Term Preterm Abortions TAB SAB Ect Mult Living                  Review of Systems  Constitutional: Negative for fever, chills, diaphoresis and fatigue.  HENT:  Negative for congestion, rhinorrhea, sneezing and neck pain.   Eyes: Negative.   Respiratory: Negative for cough, chest tightness and shortness of breath.   Cardiovascular: Negative for chest pain and leg swelling.  Gastrointestinal: Negative for nausea, vomiting, abdominal pain, diarrhea and blood in stool.  Genitourinary: Negative for frequency, hematuria, flank pain and difficulty urinating.  Musculoskeletal: Positive for joint swelling. Negative for back pain.  Skin: Positive for rash. Negative for wound.  Neurological: Negative for dizziness, speech difficulty, weakness, numbness and headaches.    Allergies  Shrimp  Home Medications   Current Outpatient Rx  Name  Route  Sig  Dispense  Refill  . albuterol (PROVENTIL HFA;VENTOLIN HFA) 108 (90 BASE) MCG/ACT inhaler   Inhalation   Inhale 2 puffs into the lungs every 6 (six) hours as needed for wheezing or shortness of breath. Shortness of breath         . amoxicillin (AMOXIL) 500 MG capsule   Oral   Take 500 mg by mouth 2 (two) times daily.         . clindamycin (CLEOCIN) 150 MG capsule   Oral   Take 1 capsule (150 mg total) by mouth every 6 (six) hours.   28 capsule   0   . diltiazem (DILACOR XR) 240 MG  24 hr capsule   Oral   Take 240 mg by mouth every morning.          . escitalopram (LEXAPRO) 20 MG tablet   Oral   Take 20 mg by mouth every morning.         Marland Kitchen HYDROcodone-acetaminophen (NORCO) 7.5-325 MG per tablet   Oral   Take 1 tablet by mouth every 6 (six) hours as needed for pain.         Marland Kitchen insulin aspart (NOVOLOG) 100 UNIT/ML injection   Subcutaneous   Inject 15 Units into the skin 2 (two) times daily.         Marland Kitchen losartan-hydrochlorothiazide (HYZAAR) 100-25 MG per tablet   Oral   Take 1 tablet by mouth every morning.          . metFORMIN (GLUCOPHAGE) 500 MG tablet   Oral   Take 500 mg by mouth 2 (two) times daily with a meal.            BP 179/77  Pulse 88  Temp(Src) 98.1 F (36.7 C)  (Oral)  Resp 20  Ht 5\' 5"  (1.651 m)  Wt 165 lb (74.844 kg)  BMI 27.46 kg/m2  SpO2 97%  Physical Exam  Constitutional: She is oriented to person, place, and time. She appears well-developed and well-nourished.  HENT:  Head: Normocephalic and atraumatic.  Eyes: Pupils are equal, round, and reactive to light.  Neck: Normal range of motion. Neck supple.  Cardiovascular: Normal rate, regular rhythm and normal heart sounds.   Pulmonary/Chest: Effort normal and breath sounds normal. No respiratory distress. She has no wheezes. She has no rales. She exhibits no tenderness.  Abdominal: Soft. Bowel sounds are normal. There is no tenderness. There is no rebound and no guarding.  Musculoskeletal: Normal range of motion. She exhibits no edema.  Patient has swelling to the left foot over the dorsum and around the ankle. It appears to be in no tissues and not the joint. There is no induration or fluctuance. It is warm to the touch and erythematous. The erythema is spreading to the lower leg past the previous marked lines. She has normal sensation in the foot normal motor function in the foot pulses are intact.  Lymphadenopathy:    She has no cervical adenopathy.  Neurological: She is alert and oriented to person, place, and time.  Skin: Skin is warm and dry. No rash noted.  Psychiatric: She has a normal mood and affect.    ED Course  Procedures (including critical care time)     1. Cellulitis       MDM  Patient with a history of diabetes presents with worsening cellulitis of the foot even while on antibiotics. I feel this point she needs to be admitted for IV antibiotics she has chosen to [facility] and I will contact the hospitalist for transfer.        Rolan Bucco, MD 12/15/12 1446

## 2012-12-21 LAB — CULTURE, BLOOD (ROUTINE X 2): Culture: NO GROWTH

## 2013-02-09 ENCOUNTER — Emergency Department (HOSPITAL_BASED_OUTPATIENT_CLINIC_OR_DEPARTMENT_OTHER): Payer: BC Managed Care – PPO

## 2013-02-09 ENCOUNTER — Emergency Department (HOSPITAL_BASED_OUTPATIENT_CLINIC_OR_DEPARTMENT_OTHER)
Admission: EM | Admit: 2013-02-09 | Discharge: 2013-02-09 | Disposition: A | Discharge status: Home / Self Care | Payer: BC Managed Care – PPO | Attending: Emergency Medicine | Admitting: Emergency Medicine

## 2013-02-09 ENCOUNTER — Encounter (HOSPITAL_BASED_OUTPATIENT_CLINIC_OR_DEPARTMENT_OTHER): Payer: Self-pay | Admitting: *Deleted

## 2013-02-09 DIAGNOSIS — K3189 Other diseases of stomach and duodenum: Secondary | ICD-10-CM | POA: Insufficient documentation

## 2013-02-09 DIAGNOSIS — R1013 Epigastric pain: Secondary | ICD-10-CM | POA: Insufficient documentation

## 2013-02-09 DIAGNOSIS — I10 Essential (primary) hypertension: Secondary | ICD-10-CM | POA: Insufficient documentation

## 2013-02-09 DIAGNOSIS — R112 Nausea with vomiting, unspecified: Secondary | ICD-10-CM | POA: Insufficient documentation

## 2013-02-09 DIAGNOSIS — Z794 Long term (current) use of insulin: Secondary | ICD-10-CM | POA: Insufficient documentation

## 2013-02-09 DIAGNOSIS — Z862 Personal history of diseases of the blood and blood-forming organs and certain disorders involving the immune mechanism: Secondary | ICD-10-CM | POA: Insufficient documentation

## 2013-02-09 DIAGNOSIS — Z79899 Other long term (current) drug therapy: Secondary | ICD-10-CM | POA: Insufficient documentation

## 2013-02-09 DIAGNOSIS — Z8639 Personal history of other endocrine, nutritional and metabolic disease: Secondary | ICD-10-CM | POA: Insufficient documentation

## 2013-02-09 DIAGNOSIS — R109 Unspecified abdominal pain: Secondary | ICD-10-CM | POA: Insufficient documentation

## 2013-02-09 DIAGNOSIS — R062 Wheezing: Secondary | ICD-10-CM | POA: Insufficient documentation

## 2013-02-09 DIAGNOSIS — E119 Type 2 diabetes mellitus without complications: Secondary | ICD-10-CM | POA: Insufficient documentation

## 2013-02-09 DIAGNOSIS — J45909 Unspecified asthma, uncomplicated: Secondary | ICD-10-CM | POA: Insufficient documentation

## 2013-02-09 LAB — URINALYSIS, ROUTINE W REFLEX MICROSCOPIC
Bilirubin Urine: NEGATIVE
Glucose, UA: >1000 mg/dL — AB
Hgb urine dipstick: NEGATIVE
Ketones, ur: NEGATIVE mg/dL
Leukocytes, UA: NEGATIVE
Nitrite: NEGATIVE
Protein, ur: NEGATIVE mg/dL
Specific Gravity, Urine: 1.013 (ref 1.005–1.030)
Urobilinogen, UA: 0.2 mg/dL (ref 0.0–1.0)
pH: 6.5 (ref 5.0–8.0)

## 2013-02-09 LAB — URINE MICROSCOPIC-ADD ON

## 2013-02-09 MED ORDER — ALBUTEROL SULFATE (5 MG/ML) 0.5% IN NEBU
INHALATION_SOLUTION | RESPIRATORY_TRACT | Status: AC
  Administered 2013-02-09: 5 mg via RESPIRATORY_TRACT
  Filled 2013-02-09: qty 1

## 2013-02-09 MED ORDER — IPRATROPIUM BROMIDE 0.02 % IN SOLN
RESPIRATORY_TRACT | Status: AC
  Administered 2013-02-09: 0.5 mg via RESPIRATORY_TRACT
  Filled 2013-02-09: qty 2.5

## 2013-02-09 MED ORDER — IPRATROPIUM BROMIDE 0.02 % IN SOLN: 0.5000 mg | Freq: Once | RESPIRATORY_TRACT | Status: AC

## 2013-02-09 MED ORDER — SUCRALFATE 1 G PO TABS: 1.0000 g | ORAL_TABLET | Freq: Four times a day (QID) | ORAL | Status: DC

## 2013-02-09 MED ORDER — FLUTICASONE-SALMETEROL 100-50 MCG/DOSE IN AEPB: 1.0000 | INHALATION_SPRAY | Freq: Every morning | RESPIRATORY_TRACT | Status: DC

## 2013-02-09 MED ORDER — ALBUTEROL SULFATE (5 MG/ML) 0.5% IN NEBU: 5.0000 mg | INHALATION_SOLUTION | Freq: Once | RESPIRATORY_TRACT | Status: AC

## 2013-02-09 MED ORDER — LANSOPRAZOLE 30 MG PO CPDR: 30.0000 mg | DELAYED_RELEASE_CAPSULE | Freq: Every day | ORAL | Status: DC

## 2013-02-09 NOTE — ED Notes (Signed)
Pt states she has been waking up with a bitter taste in her mouth for a few weeks. +nausea. Vomits ?bile sometimes. Decreased appetite. Also states she saw her Dr. And was given a sample of Advair for wheezing. "It helped, but I ran out x 1 week." No distress. Exp wheezing noted. Annabelle Harman, RRT to triage to assess.

## 2013-02-09 NOTE — ED Provider Notes (Signed)
History     CSN: [id number]  Arrival date & time 02/09/13  1310   First MD Initiated Contact with Patient 02/09/13 1411      Chief Complaint  Patient presents with  . Abdominal Pain    (Consider location/radiation/quality/duration/timing/severity/associated sxs/prior treatment) HPI Patient presents with dyspepsia. She notes that for the past few weeks she is awakened in the middle of the foul metallic taste. There is occasionally bilious vomiting as well. There are no diurnal symptoms. There is no other chest pain, no other dyspnea. No other abdominal pain, no back pain, no fevers, no chills. She denies a history of GERD or gastritis. She has been taking intermittent TUMS, other OTC medication with no relief.  Past Medical History  Diagnosis Date  . Hypertension   . Diabetes mellitus   . Hyperlipidemia   . Asthma     Past Surgical History  Procedure Laterality Date  . Tonsillectomy    . Breast lumpectomy      Family History  Problem Relation Age of Onset  . Cancer Mother   . Diabetes Mother   . Hyperlipidemia Mother   . Hypertension Mother   . Rheum arthritis Father   . Asthma Father   . Stroke Father   . Thyroid disease Father   . Thyroid disease Brother     History  Substance Use Topics  . Smoking status: Not on file  . Smokeless tobacco: Not on file  . Alcohol Use: No    OB History   Grav Para Term Preterm Abortions TAB SAB Ect Mult Living                  Review of Systems  Constitutional:       Per HPI, otherwise negative  HENT:       Per HPI, otherwise negative  Respiratory: Positive for wheezing.   Cardiovascular:       Per HPI, otherwise negative  Gastrointestinal: Positive for nausea and vomiting.  Endocrine:       Negative aside from HPI  Genitourinary:       Neg aside from HPI   Musculoskeletal:       Per HPI, otherwise negative  Skin: Negative.   Neurological: Negative for syncope.    Allergies  Shrimp  Home  Medications   Current Outpatient Rx  Name  Route  Sig  Dispense  Refill  . albuterol (PROVENTIL HFA;VENTOLIN HFA) 108 (90 BASE) MCG/ACT inhaler   Inhalation   Inhale 2 puffs into the lungs every 6 (six) hours as needed for wheezing or shortness of breath. Shortness of breath         . clindamycin (CLEOCIN) 150 MG capsule   Oral   Take 1 capsule (150 mg total) by mouth every 6 (six) hours.   28 capsule   0   . diltiazem (DILACOR XR) 240 MG 24 hr capsule   Oral   Take 240 mg by mouth every morning.          . escitalopram (LEXAPRO) 20 MG tablet   Oral   Take 20 mg by mouth every morning.         Marland Kitchen HYDROcodone-acetaminophen (NORCO) 7.5-325 MG per tablet   Oral   Take 1 tablet by mouth every 6 (six) hours as needed for pain.         Marland Kitchen insulin aspart (NOVOLOG) 100 UNIT/ML injection   Subcutaneous   Inject 15 Units into the skin 2 (two) times daily.         Marland Kitchen  losartan-hydrochlorothiazide (HYZAAR) 100-25 MG per tablet   Oral   Take 1 tablet by mouth every morning.          . metFORMIN (GLUCOPHAGE) 500 MG tablet   Oral   Take 500 mg by mouth 2 (two) times daily with a meal.            BP 171/71  Pulse 89  Temp(Src) 98.3 F (36.8 C) (Oral)  Resp 16  Ht 5\' 5"  (1.651 m)  Wt 165 lb (74.844 kg)  BMI 27.46 kg/m2  SpO2 100%  Physical Exam  Nursing note and vitals reviewed. Constitutional: She is oriented to person, place, and time. She appears well-developed and well-nourished. No distress.  HENT:  Head: Normocephalic and atraumatic.  Eyes: Conjunctivae and EOM are normal.  Cardiovascular: Normal rate and regular rhythm.   Pulmonary/Chest: Effort normal and breath sounds normal. No stridor. No respiratory distress.  Abdominal: Soft. Bowel sounds are normal. She exhibits no distension. There is no tenderness. There is no rebound and no guarding.  Musculoskeletal: She exhibits no edema.  Neurological: She is alert and oriented to person, place, and time. No  cranial nerve deficit.  Skin: Skin is warm and dry.  Psychiatric: She has a normal mood and affect.    ED Course  Procedures (including critical care time)  Labs Reviewed  GLUCOSE, CAPILLARY - Abnormal; Notable for the following:    Glucose-Capillary 228 (*)    All other components within normal limits  URINALYSIS, ROUTINE W REFLEX MICROSCOPIC - Abnormal; Notable for the following:    Glucose, UA >1000 (*)    All other components within normal limits  URINE MICROSCOPIC-ADD ON   Dg Chest 2 View  02/09/2013   *RADIOLOGY REPORT*  Clinical Data: Asthma, nausea  CHEST - 2 VIEW  Comparison: 10/08/2012  Findings: The heart and pulmonary vascularity are within normal limits.  The lungs are clear bilaterally.  No acute bony abnormality is seen.  IMPRESSION: No acute abnormality noted.   Original Report Authenticated By: Alcide Clever, M.D.     No diagnosis found.  Prior to my exam the patient had requested a breathing treatment for wheezing.  On my exam there was no wheezing present.    MDM  Patient presents with weeks of dyspepsia, nocturnal nausea, vomiting. Symptoms are suggestive of gastroesophageal issue.  The patient is in no distress on exam, afebrile with no abdominal pain.  Given his absence of distress, her absence of other complaints, a soft abdomen, the patient was discharged with a trial of PPI therapy, provided GI followup.  The patient had an additional complaint of decades long pain in her right upper quadrant that was associated with menstrual cycles, and is now monthly.  We discussed possibilities and she will consult her PMD.       Gerhard Munch, MD 02/09/13 1445

## 2013-08-07 ENCOUNTER — Emergency Department (HOSPITAL_COMMUNITY)
Admission: EM | Admit: 2013-08-07 | Discharge: 2013-08-07 | Disposition: A | Discharge status: Other Acute Inpatient Hospital | Payer: BC Managed Care – PPO | Attending: Emergency Medicine | Admitting: Emergency Medicine

## 2013-08-07 ENCOUNTER — Encounter (HOSPITAL_COMMUNITY): Payer: Self-pay | Admitting: *Deleted

## 2013-08-07 ENCOUNTER — Inpatient Hospital Stay (HOSPITAL_COMMUNITY)
Admission: AD | Admit: 2013-08-07 | Discharge: 2013-08-10 | DRG: 897 | Disposition: A | Discharge status: Home / Self Care | Payer: BC Managed Care – PPO | Source: Intra-hospital | Attending: Psychiatry | Admitting: Psychiatry

## 2013-08-07 ENCOUNTER — Encounter (HOSPITAL_COMMUNITY): Payer: Self-pay | Admitting: Emergency Medicine

## 2013-08-07 DIAGNOSIS — R45851 Suicidal ideations: Secondary | ICD-10-CM | POA: Insufficient documentation

## 2013-08-07 DIAGNOSIS — E119 Type 2 diabetes mellitus without complications: Secondary | ICD-10-CM | POA: Insufficient documentation

## 2013-08-07 DIAGNOSIS — I10 Essential (primary) hypertension: Secondary | ICD-10-CM | POA: Insufficient documentation

## 2013-08-07 DIAGNOSIS — F411 Generalized anxiety disorder: Secondary | ICD-10-CM | POA: Diagnosis present

## 2013-08-07 DIAGNOSIS — F39 Unspecified mood [affective] disorder: Secondary | ICD-10-CM | POA: Insufficient documentation

## 2013-08-07 DIAGNOSIS — J45909 Unspecified asthma, uncomplicated: Secondary | ICD-10-CM | POA: Insufficient documentation

## 2013-08-07 DIAGNOSIS — F1410 Cocaine abuse, uncomplicated: Secondary | ICD-10-CM | POA: Insufficient documentation

## 2013-08-07 DIAGNOSIS — F329 Major depressive disorder, single episode, unspecified: Secondary | ICD-10-CM | POA: Insufficient documentation

## 2013-08-07 DIAGNOSIS — E785 Hyperlipidemia, unspecified: Secondary | ICD-10-CM | POA: Diagnosis present

## 2013-08-07 DIAGNOSIS — F332 Major depressive disorder, recurrent severe without psychotic features: Secondary | ICD-10-CM | POA: Diagnosis present

## 2013-08-07 DIAGNOSIS — F1020 Alcohol dependence, uncomplicated: Principal | ICD-10-CM | POA: Diagnosis present

## 2013-08-07 DIAGNOSIS — Z79899 Other long term (current) drug therapy: Secondary | ICD-10-CM

## 2013-08-07 DIAGNOSIS — Z794 Long term (current) use of insulin: Secondary | ICD-10-CM | POA: Insufficient documentation

## 2013-08-07 DIAGNOSIS — Z3202 Encounter for pregnancy test, result negative: Secondary | ICD-10-CM | POA: Insufficient documentation

## 2013-08-07 DIAGNOSIS — F3289 Other specified depressive episodes: Secondary | ICD-10-CM | POA: Insufficient documentation

## 2013-08-07 DIAGNOSIS — F1010 Alcohol abuse, uncomplicated: Secondary | ICD-10-CM

## 2013-08-07 LAB — COMPREHENSIVE METABOLIC PANEL
Albumin: 4.3 g/dL (ref 3.5–5.2)
Alkaline Phosphatase: 93 U/L (ref 39–117)
BUN: 5 mg/dL — ABNORMAL LOW (ref 6–23)
Creatinine, Ser: 0.65 mg/dL (ref 0.50–1.10)
GFR calc Af Amer: >90 mL/min (ref >90)
Glucose, Bld: 146 mg/dL — ABNORMAL HIGH (ref 70–99)
Total Bilirubin: 0.4 mg/dL (ref 0.3–1.2)
Total Protein: 8.1 g/dL (ref 6.0–8.3)

## 2013-08-07 LAB — SALICYLATE LEVEL: Salicylate Lvl: <2 mg/dL — ABNORMAL LOW (ref 2.8–20.0)

## 2013-08-07 LAB — RAPID URINE DRUG SCREEN, HOSP PERFORMED
Barbiturates: NOT DETECTED
Benzodiazepines: NOT DETECTED
Cocaine: POSITIVE — AB

## 2013-08-07 LAB — CBC
HCT: 40.6 % (ref 36.0–46.0)
Hemoglobin: 13.7 g/dL (ref 12.0–15.0)
MCHC: 33.7 g/dL (ref 30.0–36.0)
MCV: 84.8 fL (ref 78.0–100.0)
RDW: 13.2 % (ref 11.5–15.5)

## 2013-08-07 LAB — GLUCOSE, CAPILLARY
Glucose-Capillary: 141 mg/dL — ABNORMAL HIGH (ref 70–99)
Glucose-Capillary: 202 mg/dL — ABNORMAL HIGH (ref 70–99)

## 2013-08-07 LAB — POCT PREGNANCY, URINE: Preg Test, Ur: NEGATIVE

## 2013-08-07 LAB — ETHANOL: Alcohol, Ethyl (B): 21 mg/dL — ABNORMAL HIGH (ref 0–11)

## 2013-08-07 MED ORDER — METFORMIN HCL 500 MG PO TABS: 1000.0000 mg | ORAL_TABLET | Freq: Two times a day (BID) | ORAL | Status: DC

## 2013-08-07 MED ORDER — ALBUTEROL SULFATE HFA 108 (90 BASE) MCG/ACT IN AERS
2.0000 | INHALATION_SPRAY | Freq: Four times a day (QID) | RESPIRATORY_TRACT | Status: DC | PRN
  Administered 2013-08-10: 2 via RESPIRATORY_TRACT
  Filled 2013-08-07: qty 6.7

## 2013-08-07 MED ORDER — ZOLPIDEM TARTRATE 5 MG PO TABS: 5.0000 mg | ORAL_TABLET | Freq: Every evening | ORAL | Status: DC | PRN

## 2013-08-07 MED ORDER — VITAMIN B-1 100 MG PO TABS
100.0000 mg | ORAL_TABLET | Freq: Every day | ORAL | Status: DC
  Administered 2013-08-08 – 2013-08-10 (×3): 100 mg via ORAL
  Filled 2013-08-07 (×5): qty 1

## 2013-08-07 MED ORDER — ALUM & MAG HYDROXIDE-SIMETH 200-200-20 MG/5ML PO SUSP: 30.0000 mL | ORAL | Status: DC | PRN

## 2013-08-07 MED ORDER — MAGNESIUM HYDROXIDE 400 MG/5ML PO SUSP: 30.0000 mL | Freq: Every day | ORAL | Status: DC | PRN

## 2013-08-07 MED ORDER — MOMETASONE FURO-FORMOTEROL FUM 100-5 MCG/ACT IN AERO: 2.0000 | INHALATION_SPRAY | Freq: Two times a day (BID) | RESPIRATORY_TRACT | Status: DC

## 2013-08-07 MED ORDER — LISINOPRIL-HYDROCHLOROTHIAZIDE 20-25 MG PO TABS: 1.0000 | ORAL_TABLET | Freq: Every day | ORAL | Status: DC

## 2013-08-07 MED ORDER — LOPERAMIDE HCL 2 MG PO CAPS: 2.0000 mg | ORAL_CAPSULE | ORAL | Status: DC | PRN

## 2013-08-07 MED ORDER — ACETAMINOPHEN 325 MG PO TABS: 650.0000 mg | ORAL_TABLET | Freq: Four times a day (QID) | ORAL | Status: DC | PRN

## 2013-08-07 MED ORDER — CHLORDIAZEPOXIDE HCL 25 MG PO CAPS
25.0000 mg | ORAL_CAPSULE | Freq: Three times a day (TID) | ORAL | Status: DC
  Filled 2013-08-07: qty 1

## 2013-08-07 MED ORDER — CHLORDIAZEPOXIDE HCL 25 MG PO CAPS
25.0000 mg | ORAL_CAPSULE | Freq: Four times a day (QID) | ORAL | Status: AC
  Administered 2013-08-07 – 2013-08-09 (×6): 25 mg via ORAL
  Filled 2013-08-07 (×6): qty 1

## 2013-08-07 MED ORDER — TRAZODONE HCL 50 MG PO TABS: 50.0000 mg | ORAL_TABLET | Freq: Every evening | ORAL | Status: DC | PRN

## 2013-08-07 MED ORDER — CHLORDIAZEPOXIDE HCL 25 MG PO CAPS
25.0000 mg | ORAL_CAPSULE | Freq: Four times a day (QID) | ORAL | Status: DC | PRN
  Administered 2013-08-08: 25 mg via ORAL
  Filled 2013-08-07 (×2): qty 1

## 2013-08-07 MED ORDER — LISINOPRIL 20 MG PO TABS: 20.0000 mg | ORAL_TABLET | Freq: Every day | ORAL | Status: DC

## 2013-08-07 MED ORDER — ADULT MULTIVITAMIN W/MINERALS CH: 1.0000 | ORAL_TABLET | Freq: Every day | ORAL | Status: DC

## 2013-08-07 MED ORDER — INSULIN ASPART 100 UNIT/ML ~~LOC~~ SOLN: 4.0000 [IU] | Freq: Three times a day (TID) | SUBCUTANEOUS | Status: DC

## 2013-08-07 MED ORDER — CHLORDIAZEPOXIDE HCL 25 MG PO CAPS: 25.0000 mg | ORAL_CAPSULE | ORAL | Status: DC

## 2013-08-07 MED ORDER — HYDROCHLOROTHIAZIDE 25 MG PO TABS: 25.0000 mg | ORAL_TABLET | Freq: Every day | ORAL | Status: DC

## 2013-08-07 MED ORDER — CHLORDIAZEPOXIDE HCL 25 MG PO CAPS: 25.0000 mg | ORAL_CAPSULE | Freq: Every day | ORAL | Status: DC

## 2013-08-07 MED ORDER — DILTIAZEM HCL ER 240 MG PO CP24
240.0000 mg | ORAL_CAPSULE | Freq: Every morning | ORAL | Status: DC
  Administered 2013-08-08 – 2013-08-10 (×3): 240 mg via ORAL
  Filled 2013-08-07 (×5): qty 1

## 2013-08-07 MED ORDER — ONDANSETRON HCL 4 MG PO TABS: 4.0000 mg | ORAL_TABLET | Freq: Three times a day (TID) | ORAL | Status: DC | PRN

## 2013-08-07 MED ORDER — DILTIAZEM HCL ER 240 MG PO CP24: 240.0000 mg | ORAL_CAPSULE | Freq: Every morning | ORAL | Status: DC

## 2013-08-07 MED ORDER — ONDANSETRON 4 MG PO TBDP: 4.0000 mg | ORAL_TABLET | Freq: Four times a day (QID) | ORAL | Status: DC | PRN

## 2013-08-07 MED ORDER — CHLORDIAZEPOXIDE HCL 25 MG PO CAPS: 25.0000 mg | ORAL_CAPSULE | Freq: Four times a day (QID) | ORAL | Status: DC

## 2013-08-07 MED ORDER — THIAMINE HCL 100 MG/ML IJ SOLN: 100.0000 mg | Freq: Once | INTRAMUSCULAR | Status: DC

## 2013-08-07 MED ORDER — ESCITALOPRAM OXALATE 20 MG PO TABS
20.0000 mg | ORAL_TABLET | Freq: Every morning | ORAL | Status: DC
  Administered 2013-08-08: 20 mg via ORAL
  Filled 2013-08-07 (×2): qty 1

## 2013-08-07 MED ORDER — INSULIN ASPART 100 UNIT/ML ~~LOC~~ SOLN: 15.0000 [IU] | Freq: Two times a day (BID) | SUBCUTANEOUS | Status: DC

## 2013-08-07 MED ORDER — CHLORDIAZEPOXIDE HCL 25 MG PO CAPS: 25.0000 mg | ORAL_CAPSULE | Freq: Four times a day (QID) | ORAL | Status: DC | PRN

## 2013-08-07 MED ORDER — ACETAMINOPHEN 325 MG PO TABS
650.0000 mg | ORAL_TABLET | Freq: Four times a day (QID) | ORAL | Status: DC | PRN
  Administered 2013-08-08 – 2013-08-10 (×3): 650 mg via ORAL
  Filled 2013-08-07 (×3): qty 2

## 2013-08-07 MED ORDER — HYDROXYZINE HCL 25 MG PO TABS
25.0000 mg | ORAL_TABLET | Freq: Four times a day (QID) | ORAL | Status: DC | PRN
  Administered 2013-08-07: 25 mg via ORAL
  Filled 2013-08-07: qty 1

## 2013-08-07 MED ORDER — METFORMIN HCL 500 MG PO TABS
1000.0000 mg | ORAL_TABLET | Freq: Two times a day (BID) | ORAL | Status: DC
  Administered 2013-08-08 – 2013-08-10 (×5): 1000 mg via ORAL
  Filled 2013-08-07 (×9): qty 2

## 2013-08-07 MED ORDER — INSULIN ASPART 100 UNIT/ML ~~LOC~~ SOLN: 0.0000 [IU] | Freq: Every day | SUBCUTANEOUS | Status: DC

## 2013-08-07 MED ORDER — ESCITALOPRAM OXALATE 10 MG PO TABS: 20.0000 mg | ORAL_TABLET | Freq: Every morning | ORAL | Status: DC

## 2013-08-07 MED ORDER — HYDROXYZINE HCL 25 MG PO TABS: 25.0000 mg | ORAL_TABLET | Freq: Four times a day (QID) | ORAL | Status: DC | PRN

## 2013-08-07 MED ORDER — INSULIN ASPART 100 UNIT/ML ~~LOC~~ SOLN
4.0000 [IU] | Freq: Three times a day (TID) | SUBCUTANEOUS | Status: DC
  Administered 2013-08-08 – 2013-08-10 (×8): 4 [IU] via SUBCUTANEOUS

## 2013-08-07 MED ORDER — ADULT MULTIVITAMIN W/MINERALS CH
1.0000 | ORAL_TABLET | Freq: Every day | ORAL | Status: DC
  Administered 2013-08-08 – 2013-08-10 (×3): 1 via ORAL
  Filled 2013-08-07 (×5): qty 1

## 2013-08-07 MED ORDER — CHLORDIAZEPOXIDE HCL 25 MG PO CAPS: 25.0000 mg | ORAL_CAPSULE | Freq: Three times a day (TID) | ORAL | Status: DC

## 2013-08-07 MED ORDER — ALBUTEROL SULFATE HFA 108 (90 BASE) MCG/ACT IN AERS: 2.0000 | INHALATION_SPRAY | Freq: Four times a day (QID) | RESPIRATORY_TRACT | Status: DC | PRN

## 2013-08-07 MED ORDER — LORAZEPAM 1 MG PO TABS
1.0000 mg | ORAL_TABLET | Freq: Three times a day (TID) | ORAL | Status: DC | PRN
  Administered 2013-08-07: 1 mg via ORAL
  Filled 2013-08-07: qty 1

## 2013-08-07 MED ORDER — INSULIN ASPART 100 UNIT/ML ~~LOC~~ SOLN: 0.0000 [IU] | Freq: Three times a day (TID) | SUBCUTANEOUS | Status: DC

## 2013-08-07 MED ORDER — MOMETASONE FURO-FORMOTEROL FUM 100-5 MCG/ACT IN AERO
2.0000 | INHALATION_SPRAY | Freq: Two times a day (BID) | RESPIRATORY_TRACT | Status: DC
  Administered 2013-08-08 – 2013-08-10 (×4): 2 via RESPIRATORY_TRACT
  Filled 2013-08-07: qty 8.8

## 2013-08-07 MED ORDER — INSULIN ASPART 100 UNIT/ML ~~LOC~~ SOLN
0.0000 [IU] | Freq: Three times a day (TID) | SUBCUTANEOUS | Status: DC
  Administered 2013-08-08 (×3): 2 [IU] via SUBCUTANEOUS
  Administered 2013-08-09: 3 [IU] via SUBCUTANEOUS
  Administered 2013-08-09: 5 [IU] via SUBCUTANEOUS
  Administered 2013-08-10: 2 [IU] via SUBCUTANEOUS

## 2013-08-07 MED ORDER — HYDROCHLOROTHIAZIDE 25 MG PO TABS
25.0000 mg | ORAL_TABLET | Freq: Every day | ORAL | Status: DC
  Administered 2013-08-08 – 2013-08-10 (×3): 25 mg via ORAL
  Filled 2013-08-07 (×5): qty 1

## 2013-08-07 MED ORDER — INSULIN ASPART 100 UNIT/ML ~~LOC~~ SOLN
0.0000 [IU] | Freq: Every day | SUBCUTANEOUS | Status: DC
  Administered 2013-08-07: 2 [IU] via SUBCUTANEOUS

## 2013-08-07 MED ORDER — THIAMINE HCL 100 MG/ML IJ SOLN
100.0000 mg | Freq: Once | INTRAMUSCULAR | Status: DC
Start: 2013-08-07 — End: 2013-08-10

## 2013-08-07 MED ORDER — NICOTINE 21 MG/24HR TD PT24: 21.0000 mg | MEDICATED_PATCH | Freq: Every day | TRANSDERMAL | Status: DC

## 2013-08-07 MED ORDER — LISINOPRIL 20 MG PO TABS
20.0000 mg | ORAL_TABLET | Freq: Every day | ORAL | Status: DC
  Administered 2013-08-07 – 2013-08-10 (×4): 20 mg via ORAL
  Filled 2013-08-07 (×6): qty 1

## 2013-08-07 MED ORDER — VITAMIN B-1 100 MG PO TABS: 100.0000 mg | ORAL_TABLET | Freq: Every day | ORAL | Status: DC

## 2013-08-07 MED ORDER — IBUPROFEN 200 MG PO TABS
600.0000 mg | ORAL_TABLET | Freq: Three times a day (TID) | ORAL | Status: DC | PRN
  Administered 2013-08-07: 600 mg via ORAL
  Filled 2013-08-07: qty 3

## 2013-08-07 NOTE — ED Notes (Signed)
Bed: ZD66 Expected date:  Expected time:  Means of arrival:  Comments: EMS-cocaine/etoh

## 2013-08-07 NOTE — ED Notes (Signed)
Pt provided with sandwhich and diet drink

## 2013-08-07 NOTE — ED Provider Notes (Signed)
Medical screening examination/treatment/procedure(s) were conducted as a shared visit with non-physician practitioner(s) and myself.  I personally evaluated the patient during the encounter.  EKG Interpretation   None      Patient [facility] for [facility] to behavioral [facility] hospital.  Hurman Horn, MD 08/12/13 [phone]

## 2013-08-07 NOTE — BH Assessment (Addendum)
Assessment Note  Lisa Anderson is a 57 y.o. single black female.  She presents at [facility] by EMS, whom she called due to tachycardia.  Pt attributes this to binging on crack cocaine over the weekend.  Stressors: Pt reports a long history of holding jobs and being financially successful, interrupted by episodic binging on crack cocaine.  Her most recent relapse was about 1 month ago.  As a result she is being evicted from her apartment, and must be out by 08/19/13.  Her adult daughter has agreed to take her in, but the pt's history of addiction has alienated her all of her adult children, along with other family members.  Pt is also concerned about her employment, fearing that she will lose her job, although she is not under corrective action.  She reports that 3 of her brothers have died due to addiction problems, the most recent being 3 years ago.  Lethality: Suicidality: Pt states, "I might as well be dead," due to recurrent addiction problems.  She initially endorses only passive SI, but as the interview progresses she reports that she is not able to contract for safety at this time.  Moreover, she has a history of 2 suicide attempts by overdose, the most recent of which was 8 years ago, and a suicidal gesture in which she tried to cut her wrist 5 years ago.  These were all precipitated by similar stressors to today's.  She denies any history of self mutilation.  Pt endorses depressed mood with symptoms noted in the "risk to self" assessment below. Homicidality: Pt denies homicidal thoughts or physical aggression.  Pt denies having access to firearms.  Pt denies having any legal problems at this time.  Pt is calm and cooperative during assessment. Psychosis: Pt denies hallucinations.  Pt does not appear to be responding to internal stimuli and exhibits no delusional thought.  Pt's reality testing appears to be intact. Substance Abuse: Pt's first use of crack cocaine was at 57 y/o, and her first use of  alcohol was at 57 y/o.  Her longest period of sobriety was 3 months, occuring about 1 year ago, according to the pt.  Her most recent relapse was about 1 month ago, and since then she has been binging about every other week when she receives her paychecks.  She reports spending about $1000 on crack per binge, and she drinks beer and smokes cigarettes along with it.  Within the past 24 hours she has used 6 - 8 "eight balls" of crack, along with 72 - 112 oz of beer.  She denies using any other substances.  She does not appear to be either intoxicated or in withdrawal at this time.  Pt denies any history of legal problems related to substance abuse, but it has alienated family and friends, created severe financial problems, and jeopardized her employment.  Social Supports: Pt replies, "no one," to this questions, but acknowledges that one of her adult daughters has taken her into her home.  She cites the fact that she is expecting a grandson in the near future as one reason that she has not harmed herself at this time.  Treatment History: Pt reports a history of several admissions to [facility] for mood and substance abuse problems.  She was also in a 3 month residential rehab program at [place], at the facility on  [address].  Additionally, she has been a resident at [facility] halfway house facilities in the past.  For many years she received  outpatient treatment at the [facility] in [place], but when [facility] took control of the facility they discontinued providing services for her because she had health insurance.  Since then she has only been receiving psychotropics from her PCP.  She has a history of participation in 12-Step meetings intermittently.  She acknowledges that she becomes lax about receiving treatment when things are going well for her.  Today, however, she believes that she would be at risk to harm herself in the community, and is willing to volunteer for admission to a  psychiatric facility.   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 Hypertension   . Diabetes mellitus   . Hyperlipidemia   . Asthma    Axis IV: economic problems, housing problems, occupational problems, problems with access to health care services and problems with primary support group Axis V: GAF = 35  Past Medical History:  Past Medical History  Diagnosis Date  . Hypertension   . Diabetes mellitus   . Hyperlipidemia   . Asthma     Past Surgical History  Procedure Laterality Date  . Tonsillectomy    . Breast lumpectomy      Family History:  Family History  Problem Relation Age of Onset  . Cancer Mother   . Diabetes Mother   . Hyperlipidemia Mother   . Hypertension Mother   . Rheum arthritis Father   . Asthma Father   . Stroke Father   . Thyroid disease Father   . Thyroid disease Brother     Social History:  reports that she has been smoking Cigarettes.  She has been smoking about 0.00 packs per day. She does not have any smokeless tobacco history on file. She reports that she drinks alcohol. She reports that she uses illicit drugs ("Crack" cocaine).  Additional Social History:  Alcohol / Drug Use Pain Medications: Denies Prescriptions: Denies Over the Counter: Denies History of alcohol / drug use?:  (Pt uses cigarettes during crack cocaine binges only.) Longest period of sobriety (when/how long): 3 months, about 1 year ago Negative Consequences of Use: Work / School;Financial;Personal relationships Substance #1 Name of Substance 1: Cocaine (crack) 1 - Age of First Use: 57 y/o 1 - Amount (size/oz): $1000 1 - Frequency: Binging every other week, around pay days 1 - Duration: 1 month in current relapse 1 - Last Use / Amount: 6 - 8 "Eight balls" in past 24 hours Substance #2 Name of Substance 2: Alcohol (beer) 2 - Age of First Use: 57  y/o 2 - Amount (size/oz): Variable 2 - Frequency: Along with crack cocaine binges 2 - Duration: 1 month in current relapse 2 - Last Use / Amount: 72 - 112 oz of beer in past 24 hours  CIWA: CIWA-Ar BP: 177/76 mmHg Pulse Rate: 110 COWS:    Allergies:  Allergies  Allergen Reactions  . Shrimp [Shellfish Allergy] Hives and Swelling    Home Medications:  (Not in a hospital admission)  OB/GYN Status:  No LMP recorded. Patient is postmenopausal.  General Assessment Data Location of Assessment: [facility] Is this a Tele or Face-to-Face Assessment?: Face-to-Face Is this an Initial Assessment or a Re-assessment for this encounter?: Initial Assessment Living Arrangements: Children (Adult daughter) Can pt return to current living arrangement?: Yes Admission Status: Voluntary Is patient capable of signing voluntary admission?: Yes [facility]) Referral Source: Other ([facility])  Medical Screening Exam [facility] Walk-in ONLY) Medical Exam completed: No Reason for MSE not completed: Other: (Medically cleared at [facility])  [facility] Crisis Care Plan Living Arrangements: Children (Adult daughter) Name of Psychiatrist: None currently Name of Therapist: None currently  Education Status Is patient currently in school?: No  Risk to self Suicidal Ideation: Yes-Currently Present Suicidal Intent: Yes-Currently Present Is patient at risk for suicide?: Yes Suicidal Plan?: No Access to Means: Yes Specify Access to Suicidal Means: In the pastt pt has overdosed & has access to medications; she has tried to cut wrists and has access to sharps What has been your use of drugs/alcohol within the last 12 months?: Binging on crack cocaine, alcohol, and cigarettes Previous Attempts/Gestures: Yes How many times?: 3 (2x by OD, 1x by attempted cutting, most recent 8 yrs ago.) Other Self Harm Risks: Cannot contract for safety. Triggers for Past Attempts: Other (Comment) (Binging on crack w/ financial  consequences, like today.) Intentional Self Injurious Behavior: None Family Suicide History: No (Nephew: bipolar; others: untreated depression.) Recent stressful life event(s): Financial Problems;Other (Comment) (Eviction  on 11/24; estrangement from family; job Px.) Persecutory voices/beliefs?: No Depression: Yes Depression Symptoms: Despondent;Insomnia;Tearfulness;Isolating;Fatigue;Feeling worthless/self pity;Guilt;Loss of interest in usual pleasures;Feeling angry/irritable (Hopelessness) Substance abuse history and/or treatment for substance abuse?: Yes (Binging on crack cocaine, alcohol, and cigarettes) Suicide prevention information given to non-admitted patients: Yes  Risk to Others Homicidal Ideation: No Thoughts of Harm to Others: No Current Homicidal Intent: No Current Homicidal Plan: No Access to Homicidal Means: No Identified Victim: None History of harm to others?: No Assessment of Violence: None Noted Violent Behavior Description: Calm, cooperative Does patient have access to weapons?: No (Denies having access to guns) Criminal Charges Pending?: No Does patient have a court date: No  Psychosis Hallucinations: None noted Delusions: None noted  Mental Status Report Appear/Hygiene: Other (Comment) (Paper scrubs) Eye Contact: Poor Motor Activity: Restlessness (Repetitive foot movement) Speech: Other (Comment) (Unremarkable) Level of Consciousness: Alert Mood: Depressed;Anxious Affect: Other (Comment) (Constricted) Anxiety Level: Minimal Thought Processes: Coherent;Relevant Judgement: Impaired Orientation: Person;Place;Time;Situation (Time: except for date (08/01/2013, per pt)) Obsessive Compulsive Thoughts/Behaviors: None  Cognitive Functioning Concentration: Decreased Memory: Recent Intact;Remote Intact IQ: Average Insight: Fair Impulse Control: Fair (Good except for addiction problems) Appetite: Poor Weight Loss: 10 (...over the past 2 weeks) Weight Gain:  0 Sleep: Decreased Total Hours of Sleep:  (Reports she only doses, x 1 year; ruminates on problems) Vegetative Symptoms: Staying in bed;Not bathing;Decreased grooming  ADLScreening [facility] Assessment Services) Patient's cognitive ability adequate to safely complete daily activities?: Yes Patient able to express need for assistance with ADLs?: Yes Independently performs ADLs?: Yes (appropriate for developmental age)  Prior Inpatient Therapy Prior Inpatient Therapy: Yes Prior Therapy Dates: Several admissions in the past to [facility] Prior Therapy Facilty/Provider(s): Past: [facility] (3 month residential rehab) Reason for Treatment: Past: [facility] (halfway houses)  Prior Outpatient Therapy Prior Outpatient Therapy: Yes Prior Therapy Dates: "A long time" ending 2 yrs ago: [address], [place] (Ended when [employer] assumed control) Prior Therapy Facilty/Provider(s): Now receives psychotropics from PCP Reason for Treatment: Intermittent involvement with 12-Step  ADL Screening (condition at time of admission) Patient's cognitive ability adequate to safely complete daily activities?: Yes Is the patient deaf or have difficulty hearing?: No Does the patient have difficulty seeing, even when wearing glasses/contacts?: No Does the patient have difficulty concentrating, remembering, or making decisions?: No Patient able to express need for assistance with ADLs?: Yes Does the patient have difficulty dressing or  bathing?: No Independently performs ADLs?: Yes (appropriate for developmental age) Does the patient have difficulty walking or climbing stairs?: No Weakness of Legs: None Weakness of Arms/Hands: None  Home Assistive Devices/Equipment Home Assistive Devices/Equipment: CBG Meter;Nebulizer    Abuse/Neglect Assessment (Assessment to be complete while patient is alone) Physical Abuse: Denies Verbal Abuse: Denies Sexual Abuse: Denies Exploitation of patient/patient's  resources: Denies Self-Neglect: Denies Values / Beliefs Cultural Requests During Hospitalization: None Spiritual Requests During Hospitalization: None   Advance Directives (For Healthcare) Advance Directive: Patient does not have advance directive;Patient would not like information Pre-existing out of facility DNR order (yellow form or pink MOST form): No Nutrition Screen- [facility] Adult/[facility] Patient's home diet: Carb modified  Additional Information 1:1 In Past 12 Months?: No CIRT Risk: No Elopement Risk: No Does patient have medical clearance?: Yes  Child/Adolescent Assessment Running Away Risk: Admits  Disposition:  Disposition Initial Assessment Completed for this Encounter: Yes Disposition of Patient: Inpatient treatment program Type of inpatient treatment program: Adult After consulting with Kathryne Sharper, MD, it has been determined that pt presents a danger to herself, for which psychiatric hospitalization is indicated.  Pt accepted to [facility] to the service of Geoffery Lyons, MD, Rm 302-2.  I then spoke to EDP Dr Fonnie Jarvis, who concurs with decision.  Pt signed Voluntary Admission and Consent for Treatment.  On Site Evaluation by:   Reviewed with Physician:  Kathryne Sharper, MD @ 18:00  Doylene Canning, MA Triage Specialist Raphael Gibney 08/07/2013 6:09 PM

## 2013-08-07 NOTE — Progress Notes (Signed)
This is a 57 years old [place] American female admitted to the unit for treatment of Substance Abuse. Patient reported that she has been abusing cocaine an alcohol for the past 8 years and have been going on Cocaine and Alcohol bing from time to time. "I get sober and I will do it, If I smoke all night, I drink all night".  She reported that she smoked too much last night and ; "my heart was raising out of control and I need help". She reported that last time she smoked was 11/11 and she spent between [phone] dollars on Cocaine.She said she has been evicted from her apartment as a result of smoking Cocaine and her last day to move out is 11/24. Patient reported that she has 5 children and they are not supportive. She stated that she smokes cocaine due to loneliness. She denied SI/HI,denied hallucination but endorsed withdrawal symptoms. Although she endorsed having withdrawal symptoms. She has medical history of hypertension, diabetes, lumpectomy and asthma. Her blood pressure was elevated and CBG was 202 on admission. Skin assessment within normal limit. She appeared sad and depressed. Patient oriented to the unit, offered meal, and Q 15 minute check initiated.

## 2013-08-07 NOTE — ED Notes (Signed)
Report given to Eric, RN

## 2013-08-07 NOTE — BH Assessment (Signed)
BHH Assessment Progress Note  At 16:00 I spoke to EDP Robyn in anticipation of planned TS assessment.  Doylene Canning, MA Triage Specialist 08/07/2013 @ 16:26

## 2013-08-07 NOTE — ED Provider Notes (Addendum)
CSN: [id number]     Arrival date & time 08/07/13  1326 History   First MD Initiated Contact with Patient 08/07/13 1336     Chief Complaint  Patient presents with  . SI   . cocaine overdose    (Consider location/radiation/quality/duration/timing/severity/associated sxs/prior Treatment) HPI Comments: Patient is a 57 year old female with a past medical history of hypertension, diabetes, hyperlipidemia, asthma and depression who presents to the emergency department via EMS with suicidal ideations and cocaine abuse. Patient states yesterday and today she smoked about "8 balls" of crack cocaine and drank 2 sixpacks of beer, she was not drinking and doing drugs to kill himself, however she does feel suicidal and would like to overdose on her medication. Has a history of the same. After using cocaine she felt her heart racing, tried to take metoprolol, however took 2 of her losartan/HCTZ pills. According to EMS, patient was very anxious and diaphoretic at the scene. States she needs help to stop using drugs. She was recently evicted from her home and is now trying to stay with her daughter. She feels very depressed and anxious. No HI.   The history is provided by the patient and the EMS personnel.    Past Medical History  Diagnosis Date  . Hypertension   . Diabetes mellitus   . Hyperlipidemia   . Asthma    Past Surgical History  Procedure Laterality Date  . Tonsillectomy    . Breast lumpectomy     Family History  Problem Relation Age of Onset  . Cancer Mother   . Diabetes Mother   . Hyperlipidemia Mother   . Hypertension Mother   . Rheum arthritis Father   . Asthma Father   . Stroke Father   . Thyroid disease Father   . Thyroid disease Brother    History  Substance Use Topics  . Smoking status: Not on file  . Smokeless tobacco: Not on file  . Alcohol Use: No   OB History   Grav Para Term Preterm Abortions TAB SAB Ect Mult Living                 Review of Systems   Psychiatric/Behavioral: Positive for suicidal ideas, dysphoric mood and decreased concentration. The patient is nervous/anxious.   All other systems reviewed and are negative.    Allergies  Shrimp  Home Medications   Current Outpatient Rx  Name  Route  Sig  Dispense  Refill  . albuterol (PROVENTIL HFA;VENTOLIN HFA) 108 (90 BASE) MCG/ACT inhaler   Inhalation   Inhale 2 puffs into the lungs every 6 (six) hours as needed for wheezing or shortness of breath. Shortness of breath         . clindamycin (CLEOCIN) 150 MG capsule   Oral   Take 1 capsule (150 mg total) by mouth every 6 (six) hours.   28 capsule   0   . diltiazem (DILACOR XR) 240 MG 24 hr capsule   Oral   Take 240 mg by mouth every morning.          . escitalopram (LEXAPRO) 20 MG tablet   Oral   Take 20 mg by mouth every morning.         . Fluticasone-Salmeterol (ADVAIR DISKUS) 100-50 MCG/DOSE AEPB   Inhalation   Inhale 1 puff into the lungs every morning.   60 each   0   . HYDROcodone-acetaminophen (NORCO) 7.5-325 MG per tablet   Oral   Take 1 tablet by mouth every  6 (six) hours as needed for pain.         Marland Kitchen insulin aspart (NOVOLOG) 100 UNIT/ML injection   Subcutaneous   Inject 15 Units into the skin 2 (two) times daily.         . lansoprazole (PREVACID) 30 MG capsule   Oral   Take 1 capsule (30 mg total) by mouth daily.   21 capsule   0   . losartan-hydrochlorothiazide (HYZAAR) 100-25 MG per tablet   Oral   Take 1 tablet by mouth every morning.          . metFORMIN (GLUCOPHAGE) 500 MG tablet   Oral   Take 500 mg by mouth 2 (two) times daily with a meal.          . sucralfate (CARAFATE) 1 G tablet   Oral   Take 1 tablet (1 g total) by mouth 4 (four) times daily.   28 tablet   0    Pulse 143  Temp(Src) 98.9 F (37.2 C) (Oral)  Resp 24  SpO2 98% Physical Exam  Nursing note and vitals reviewed. Constitutional: She is oriented to person, place, and time. She appears  well-developed and well-nourished. No distress.  HENT:  Head: Normocephalic and atraumatic.  Mouth/Throat: Oropharynx is clear and moist.  Eyes: Conjunctivae are normal.  Neck: Normal range of motion. Neck supple.  Cardiovascular: Regular rhythm and normal heart sounds.  Tachycardia present.   Pulmonary/Chest: Effort normal and breath sounds normal.  Abdominal: Soft. Bowel sounds are normal. There is no tenderness.  Musculoskeletal: Normal range of motion. She exhibits no edema.  Neurological: She is alert and oriented to person, place, and time.  Skin: Skin is warm and dry. She is not diaphoretic.  Psychiatric: Her behavior is normal. Her mood appears anxious. She exhibits a depressed mood. She expresses suicidal ideation. She expresses no homicidal ideation. She expresses no suicidal plans.    ED Course  Procedures (including critical care time) Labs Review Labs Reviewed  CBC - Abnormal; Notable for the following:    WBC 16.9 (*)    All other components within normal limits  COMPREHENSIVE METABOLIC PANEL - Abnormal; Notable for the following:    Potassium 3.4 (*)    Glucose, Bld 146 (*)    BUN 5 (*)    All other components within normal limits  ETHANOL - Abnormal; Notable for the following:    Alcohol, Ethyl (B) 21 (*)    All other components within normal limits  SALICYLATE LEVEL - Abnormal; Notable for the following:    Salicylate Lvl <2.0 (*)    All other components within normal limits  URINE RAPID DRUG SCREEN (HOSP PERFORMED) - Abnormal; Notable for the following:    Cocaine POSITIVE (*)    All other components within normal limits  GLUCOSE, CAPILLARY - Abnormal; Notable for the following:    Glucose-Capillary 138 (*)    All other components within normal limits  ACETAMINOPHEN LEVEL  POCT PREGNANCY, URINE   Imaging Review No results found.  EKG Interpretation   None       MDM   1. Cocaine abuse   2. Suicidal ideation     Patient with cocaine abuse, SI.  Tearful, anxious, tachycardic. Labs pending. TTS consult. EKG.   Medically cleared for psych eval.  Trevor Mace, PA-C 08/07/13 1525  Trevor Mace, PA-C 08/15/13 [phone]

## 2013-08-07 NOTE — ED Notes (Signed)
1 pt belonging bag in locker #27  Shoes, shirt, bra, underwear and pants in bag. NO cell phone, wallet, or keys with pt.

## 2013-08-07 NOTE — Tx Team (Signed)
Initial Interdisciplinary Treatment Plan  PATIENT STRENGTHS: (choose at least two) Ability for insight Motivation for treatment/growth  PATIENT STRESSORS: Health problems Substance abuse   PROBLEM LIST: Problem List/Patient Goals Date to be addressed Date deferred Reason deferred Estimated date of resolution  Substance Abuse 08/08/13                                                      DISCHARGE CRITERIA:  Motivation to continue treatment in a less acute level of care Verbal commitment to aftercare and medication compliance Withdrawal symptoms are absent or subacute and managed without 24-hour nursing intervention  PRELIMINARY DISCHARGE PLAN: Attend aftercare/continuing care group Attend 12-step recovery group Placement in alternative living arrangements  PATIENT/FAMIILY INVOLVEMENT: This treatment plan has been presented to and reviewed with the patient, Lisa Anderson, and/or family member.  The patient and family have been given the opportunity to ask questions and make suggestions.  Lisa Anderson [facility] 08/07/2013, 10:46 PM

## 2013-08-07 NOTE — ED Notes (Signed)
Pt reports she is diabetic and has not eaten since yesterday, reports she "felt like her blood sugar was dropping".

## 2013-08-07 NOTE — ED Notes (Addendum)
Per ems pt reports she was evicted out of her house a few weeks ago. Reports she smokes cocaine/crack every 2 weeks. Today she used significantly more than usual. Reports she drinks ETOH when she smokes crack. Pt felt like her heart was racing, she thought she took metoprolol, but instead took 2 losartan/hctz pill.  Pt extremely anxious and febrile at scene. Ice applied to neck. Pt reports she feels better, hr initially 180 upon arrival 120.  Pt denies other drug use.  Pt reports she did not use drugs to kill herself, but when she uses she then feels SI, pt stated to nurse if she goes home she wants to kill herself. Pt has attempted SI 2x before by taking pills.  16 g L AC

## 2013-08-07 NOTE — ED Notes (Signed)
No acute distress noted.

## 2013-08-08 ENCOUNTER — Encounter (HOSPITAL_COMMUNITY): Payer: Self-pay | Admitting: Psychiatry

## 2013-08-08 DIAGNOSIS — F1010 Alcohol abuse, uncomplicated: Secondary | ICD-10-CM | POA: Diagnosis present

## 2013-08-08 DIAGNOSIS — F329 Major depressive disorder, single episode, unspecified: Secondary | ICD-10-CM | POA: Diagnosis present

## 2013-08-08 DIAGNOSIS — F1410 Cocaine abuse, uncomplicated: Secondary | ICD-10-CM

## 2013-08-08 DIAGNOSIS — F39 Unspecified mood [affective] disorder: Secondary | ICD-10-CM

## 2013-08-08 DIAGNOSIS — F322 Major depressive disorder, single episode, severe without psychotic features: Secondary | ICD-10-CM

## 2013-08-08 LAB — GLUCOSE, CAPILLARY
Glucose-Capillary: 146 mg/dL — ABNORMAL HIGH (ref 70–99)
Glucose-Capillary: 149 mg/dL — ABNORMAL HIGH (ref 70–99)

## 2013-08-08 LAB — TSH: TSH: 0.497 u[IU]/mL (ref 0.350–4.500)

## 2013-08-08 LAB — HEMOGLOBIN A1C
Hgb A1c MFr Bld: 8.1 % — ABNORMAL HIGH (ref <5.7)
Mean Plasma Glucose: 186 mg/dL — ABNORMAL HIGH (ref <117)

## 2013-08-08 MED ORDER — RAMELTEON 8 MG PO TABS
8.0000 mg | ORAL_TABLET | Freq: Every day | ORAL | Status: DC
  Administered 2013-08-08 – 2013-08-09 (×2): 8 mg via ORAL
  Filled 2013-08-08 (×4): qty 1

## 2013-08-08 MED ORDER — DULOXETINE HCL 30 MG PO CPEP
30.0000 mg | ORAL_CAPSULE | Freq: Every day | ORAL | Status: DC
  Administered 2013-08-08 – 2013-08-10 (×3): 30 mg via ORAL
  Filled 2013-08-08 (×6): qty 1

## 2013-08-08 NOTE — Progress Notes (Signed)
D: Patient in the hallway on approach.  Patient states she continues to have withdrawal symptoms.  Patient has stayed in bed most of the shift.  Patient is cooperative but not participating. Patient states she just wants to feel better. Patient denies SI/HI and denies AVH. A: Staff to monitor Q 15 mins for safety.  Encouragement and support offered.  Scheduled medications administered per orders. R: Patient remains safe on the unit.  Patient did not attend group tonight.  Patient only visible one time and that was because there was a fire drill so she had to come out of her room.

## 2013-08-08 NOTE — B[facility] Group Notes (Signed)
[facility] LCSW Group Therapy  08/08/2013 2:41 PM  Type of Therapy:  Group Therapy  Participation Level:  Did Not Attend  Smart, Herbert Seta 08/08/2013, 2:41 PM

## 2013-08-08 NOTE — Progress Notes (Signed)
Patient ID: Lisa Anderson, female   DOB: [birth year], 57 y.o.   MRN: [id number] Pt did not attend 11am group. She was asleep.

## 2013-08-08 NOTE — Clinical Social Work Note (Signed)
Per pt request (pt signed ROI giving consent), CSW faxed letter stating pt's hospital admission date and stating that her discharge date is currently unknown. Faxed at 11:30AM 08/08/13. Copy in EPIC. Letter and fax confirmation given to pt for her personal records.  [employer], LCSWA 08/08/2013 11:32 AM

## 2013-08-08 NOTE — Progress Notes (Signed)
Patient ID: Lisa Anderson, female   DOB: 1956-08-20, 57 y.o.   MRN: [id number] She has been up for medications but has not attended group. Has had little interaction with peers .  She has voiced having mild withdrawal symptoms but has not requested prn medications.

## 2013-08-08 NOTE — Progress Notes (Signed)
Didn't attend group 

## 2013-08-08 NOTE — Progress Notes (Signed)
Recreation Therapy Notes  Date: 11.13.2014 Time: 2:30pm Location: [address] Dayroom  Group Topic: Animal Assisted Activities (AAA)  Behavioral Response: Did not attend.   Loralye Loberg L Braven Wolk, LRT/CTRS  Matti Minney L 08/08/2013 4:42 PM 
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
## 2013-08-08 NOTE — Progress Notes (Signed)
Adult Psychoeducational Group Note  Date:  08/08/2013 Time:  9:41 AM  Group Topic/Focus:  Goals Group:   The focus of this group is to help patients establish daily goals to achieve during treatment and discuss how the patient can incorporate goal setting into their daily lives to aide in recovery. Orientation:   The focus of this group is to educate the patient on the purpose and policies of crisis stabilization and provide a format to answer questions about their admission.  The group details unit policies and expectations of patients while admitted.  Participation Level:  Did Not Attend  Additional Comments:  Pt was in bed.  Caswell Corwin 08/08/2013, 9:41 AM

## 2013-08-08 NOTE — H&P (Signed)
Psychiatric Admission Assessment Adult  Patient Identification:  Lisa Anderson Date of Evaluation:  08/08/2013 Chief Complaint:  DEPRESSIVE DISORDER NOS COCAIN DEPENDENCE ALCOHOL DEPENDENCE History of Present Illness:: 57 Y/O female who states she used too much cocaine, her  heart got racing, called the ambulance. Pulse went up to 170. Has been using on and off for 8 years twice a month binge pattern. Alcohol in the same binge pattern. States she has felt more and more depressed, overwhelmed. Due to her use she has alienated herself from hr family. She works and she is afraid she is going to lose her job. She is on Lexapro but does not seem to be working for her Elements:  Location:  inpatient. Quality:  unable to function. Severity:  severe. Timing:  every day. Duration:  on and off for 8 years. Context:  abusing cocaine, underlying mood disorder. Associated Signs/Synptoms: Depression Symptoms:  depressed mood, anhedonia, hypersomnia, fatigue, difficulty concentrating, suicidal thoughts without plan, anxiety, panic attacks, loss of energy/fatigue, disturbed sleep, weight loss, decreased appetite, (Hypo) Manic Symptoms:  Irritable Mood, Labiality of Mood, Anxiety Symptoms:  Excessive Worry, Panic Symptoms, Psychotic Symptoms:  denies PTSD Symptoms: Negative  Psychiatric Specialty Exam: Physical Exam  Review of Systems  Constitutional: Positive for malaise/fatigue.  HENT: Negative.   Eyes: Negative.   Respiratory: Negative.   Cardiovascular: Negative.   Gastrointestinal: Positive for nausea.  Genitourinary: Negative.   Musculoskeletal: Positive for myalgias.  Skin: Negative.   Neurological: Positive for weakness.  Endo/Heme/Allergies: Negative.   Psychiatric/Behavioral: Positive for depression and substance abuse. The patient is nervous/anxious.     Blood pressure 147/87, pulse 94, temperature 97.8 F (36.6 C), temperature source Oral, resp. rate 18, height 5'  7.5" (1.715 m), weight 81.194 kg (179 lb).Body mass index is 27.61 kg/(m^2).  General Appearance: Disheveled  Eye Contact::  Minimal  Speech:  Clear and Coherent, Slow and not sponteneous  Volume:  Decreased  Mood:  Anxious, Depressed and Irritable  Affect:  Restricted  Thought Process:  Coherent and Goal Directed  Orientation:  Full (Time, Place, and Person)  Thought Content:  symptoms, worries, concerns  Suicidal Thoughts:  No  Homicidal Thoughts:  No  Memory:  Immediate;   Fair Recent;   Fair Remote;   Fair  Judgement:  Fair  Insight:  Present and superficial  Psychomotor Activity:  Restlessness  Concentration:  Fair  Recall:  Fair  Akathisia:  No  Handed:    AIMS (if indicated):     Assets:  Desire for Improvement Vocational/Educational  Sleep:  Number of Hours: 6.5    Past Psychiatric History: Diagnosis: Cocaine Abuse, Major Depression, Mood Disorder NOS  Hospitalizations: [facility]  Outpatient Care: Denies used to be at [facility], PCP prescribes the Lexapro  Substance Abuse Care: [facility] 6 years ago  Self-Mutilation: Yes  Suicidal Attempts:Yes  Violent Behaviors:No   Past Medical History:   Past Medical History  Diagnosis Date  . Hypertension   . Diabetes mellitus   . Hyperlipidemia   . Asthma     Allergies:   Allergies  Allergen Reactions  . Shrimp [Shellfish Allergy] Hives and Swelling   PTA Medications: Prescriptions prior to admission  Medication Sig Dispense Refill  . albuterol (PROVENTIL HFA;VENTOLIN HFA) 108 (90 BASE) MCG/ACT inhaler Inhale 2 puffs into the lungs every 6 (six) hours as needed for wheezing or shortness of breath. Shortness of breath      . diltiazem (DILACOR XR) 240 MG 24 hr capsule Take  240 mg by mouth every morning.       . escitalopram (LEXAPRO) 20 MG tablet Take 20 mg by mouth every morning.      . Fluticasone-Salmeterol (ADVAIR DISKUS) 100-50 MCG/DOSE AEPB Inhale 1 puff into the lungs every morning.  60 each   0  . insulin aspart (NOVOLOG) 100 UNIT/ML injection Inject 15 Units into the skin 2 (two) times daily.      Marland Kitchen lisinopril-hydrochlorothiazide (PRINZIDE,ZESTORETIC) 20-25 MG per tablet Take 1 tablet by mouth daily.      . metFORMIN (GLUCOPHAGE) 500 MG tablet Take 1,000 mg by mouth 2 (two) times daily with a meal.        Previous Psychotropic Medications:  Medication/Dose  Lexapro, Prozac, Zoloft  Paxil, Effexor , Wellbutrin               Substance Abuse History in the last 12 months:  yes  Consequences of Substance Abuse: Withdrawal Symptoms:   Diaphoresis Diarrhea Headaches Nausea Tremors Vomiting aches, pains "flu like"  Social History:  reports that she has been smoking.  She does not have any smokeless tobacco history on file. She reports that she drinks about 3.6 ounces of alcohol per week. She reports that she uses illicit drugs ("Crack" cocaine). Additional Social History: History of alcohol / drug use?: Yes Withdrawal Symptoms: Sweats;Tremors 1 - Age of First Use: 8 years ago 1 - Amount (size/oz): [phone]                  Current Place of Residence:  Lives with daughter Place of Birth:   Family Members: Marital Status:  Divorced Children:  Sons: 38, 23  Daughters: 26 31 27  Relationships: Education:  10 th  Educational Problems/Performance: Religious Beliefs/Practices: Jehovah Witness History of Abuse (Emotional/Phsycial/Sexual) Denies Armed forces technical officer; Printmaker History:  None. Legal History: Denies Hobbies/Interests:  Family History:   Family History  Problem Relation Age of Onset  . Cancer Mother   . Diabetes Mother   . Hyperlipidemia Mother   . Hypertension Mother   . Rheum arthritis Father   . Asthma Father   . Stroke Father   . Thyroid disease Father   . Thyroid disease Brother     Results for orders placed during the hospital encounter of 08/07/13 (from the past 72 hour(s))  GLUCOSE, CAPILLARY     Status: Abnormal    Collection Time    08/07/13  9:40 PM      Result Value Range   Glucose-Capillary 202 (*) 70 - 99 mg/dL  GLUCOSE, CAPILLARY     Status: Abnormal   Collection Time    08/08/13  6:12 AM      Result Value Range   Glucose-Capillary 128 (*) 70 - 99 mg/dL   Psychological Evaluations:  Assessment:   DSM5:  Schizophrenia Disorders:  none Obsessive-Compulsive Disorders:  none Trauma-Stressor Disorders:  none Substance/Addictive Disorders:  Cocaine Abuse/dependence, Alcohol Abuse/dependence Depressive Disorders:  Major Depressive Disorder - Severe (296.23)  AXIS I:  Mood Disorder NOS AXIS II:  Deferred AXIS III:   Past Medical History  Diagnosis Date  . Hypertension   . Diabetes mellitus   . Hyperlipidemia   . Asthma    AXIS IV:  other psychosocial or environmental problems AXIS V:  41-50 serious symptoms  Treatment Plan/Recommendations:  Supportive approach/coping skills/relapse prevention  Detox as needed                                                                  D/C Lexapro trial with Cymbalta Treatment Plan Summary: Daily contact with patient to assess and evaluate symptoms and progress in treatment Medication management Current Medications:  Current Facility-Administered Medications  Medication Dose Route Frequency Provider Last Rate Last Dose  . acetaminophen (TYLENOL) tablet 650 mg  650 mg Oral Q6H PRN Kerry Hough, PA-C   650 mg at 08/08/13 0630  . albuterol (PROVENTIL HFA;VENTOLIN HFA) 108 (90 BASE) MCG/ACT inhaler 2 puff  2 puff Inhalation Q6H PRN Kerry Hough, PA-C      . alum & mag hydroxide-simeth (MAALOX/MYLANTA) 200-200-20 MG/5ML suspension 30 mL  30 mL Oral Q4H PRN Kerry Hough, PA-C      . chlordiazePOXIDE (LIBRIUM) capsule 25 mg  25 mg Oral Q6H PRN Kerry Hough, PA-C   25 mg at 08/08/13 0630  . chlordiazePOXIDE (LIBRIUM) capsule 25 mg  25 mg Oral QID Kerry Hough, PA-C   25 mg at  08/08/13 0827   Followed by  . [START ON 08/09/2013] chlordiazePOXIDE (LIBRIUM) capsule 25 mg  25 mg Oral TID Kerry Hough, PA-C       Followed by  . [START ON 08/10/2013] chlordiazePOXIDE (LIBRIUM) capsule 25 mg  25 mg Oral BH-qamhs Spencer E Simon, PA-C       Followed by  . [START ON 08/12/2013] chlordiazePOXIDE (LIBRIUM) capsule 25 mg  25 mg Oral Daily Kerry Hough, PA-C      . diltiazem (DILACOR XR) 24 hr capsule 240 mg  240 mg Oral q morning - 10a Kerry Hough, PA-C   240 mg at 08/08/13 4540  . escitalopram (LEXAPRO) tablet 20 mg  20 mg Oral q morning - 10a Kerry Hough, PA-C   20 mg at 08/08/13 0827  . lisinopril (PRINIVIL,ZESTRIL) tablet 20 mg  20 mg Oral Daily Rachael Fee, MD   20 mg at 08/08/13 0827   And  . hydrochlorothiazide (HYDRODIURIL) tablet 25 mg  25 mg Oral Daily Rachael Fee, MD   25 mg at 08/08/13 9811  . hydrOXYzine (ATARAX/VISTARIL) tablet 25 mg  25 mg Oral Q6H PRN Kerry Hough, PA-C   25 mg at 08/07/13 2223  . insulin aspart (novoLOG) injection 0-15 Units  0-15 Units Subcutaneous TID WC Kerry Hough, PA-C   2 Units at 08/08/13 9147  . insulin aspart (novoLOG) injection 0-5 Units  0-5 Units Subcutaneous QHS Kerry Hough, PA-C   2 Units at 08/07/13 2227  . insulin aspart (novoLOG) injection 4 Units  4 Units Subcutaneous TID WC Kerry Hough, PA-C   4 Units at 08/08/13 8295  . loperamide (IMODIUM) capsule 2-4 mg  2-4 mg Oral PRN Kerry Hough, PA-C      . metFORMIN (GLUCOPHAGE) tablet 1,000 mg  1,000 mg Oral BID WC Kerry Hough, PA-C   1,000 mg at 08/08/13 0827  . mometasone-formoterol (DULERA) 100-5 MCG/ACT inhaler 2 puff  2 puff Inhalation BID Kerry Hough, PA-C      . multivitamin with minerals tablet 1 tablet  1 tablet Oral Daily Kerry Hough, PA-C  1 tablet at 08/08/13 0826  . ondansetron (ZOFRAN-ODT) disintegrating tablet 4 mg  4 mg Oral Q6H PRN Kerry Hough, PA-C      . thiamine (B-1) injection 100 mg  100 mg Intramuscular Once  Spencer E Simon, PA-C      . thiamine (VITAMIN B-1) tablet 100 mg  100 mg Oral Daily Kerry Hough, PA-C   100 mg at 08/08/13 0827    Observation Level/Precautions:  15 minute checks  Laboratory:  As per ED  Psychotherapy:  Individual/group  Medications:  Librium Detox/Cymbalta  Consultations:    Discharge Concerns:    Estimated LOS: 3-5 days  Other:     I certify that inpatient services furnished can reasonably be expected to improve the patient's condition.   Jode Lippe A 11/13/201410:12 AM

## 2013-08-08 NOTE — Progress Notes (Signed)
Pt did not attend AA speaker meeting. Pt was with the doctor.

## 2013-08-08 NOTE — Progress Notes (Signed)
Recreation Therapy Notes  Date: 11.13.2014 Time: 3:00pm Location: [address] Dayroom  Group Topic: Leisure Education, Team Work  Goal Area(s) Addresses:  Patient will identify positive benefits of leisure.  Behavioral Response: Did not attend.   Havanah Nelms L Chanya Chrisley, LRT/CTRS  Rasaan Brotherton L 08/08/2013 4:23 PM 

## 2013-08-08 NOTE — Progress Notes (Signed)
Inpatient Diabetes Program Recommendations  AACE/ADA: New Consensus Statement on Inpatient Glycemic Control (2013)  Target Ranges:  Prepandial:   less than 140 mg/dL      Peak postprandial:   less than 180 mg/dL (1-2 hours)      Critically ill patients:  140 - 180 mg/dL     Results for Lisa Anderson, Lisa Anderson (MRN [id number]) as of 08/08/2013 11:14  Ref. Range 08/07/2013 14:04 08/07/2013 18:00 08/07/2013 21:40  Glucose-Capillary Latest Range: 70-99 mg/dL 725 (H) 366 (H) 440 (H)    Results for Lisa Anderson, Lisa Anderson (MRN [id number]) as of 08/08/2013 11:14  Ref. Range 08/08/2013 06:12  Glucose-Capillary Latest Range: 70-99 mg/dL 387 (H)    **Admitted +crack cocaine, +ETOH.  Brought in my EMS.  Patient has long-standing history of substance abuse.  Evicted from apartment and living with her daughter at present.  Per records, patient is a Lawyer at [employer].   **History of Type 2 DM.  Per records, patient takes Metformin 1000 mg bid + Novolog 15 units bid.  Unsure if patient's home med rec is correct.  Question if patient is taking Novolog 70/30 insulin 15 units bid instead of just plain Novolog at home??  **Noted patient is currently getting the following meds for DM as an inpatient: 1. Metformin 1000 mg bid 2. Novolog Moderate correction scale (SSI) tid ac + HS 3. Novolog 4 units tid with meals (meal coverage)  **Patient's AM CBG today 128 mg/dl.  Will continue to follow and assist with Inpatient DM glucose management.   Will follow. Ambrose Finland RN, MSN, CDE Diabetes Coordinator Inpatient Diabetes Program Team Pager: [phone] (8a-10p)

## 2013-08-08 NOTE — BHH Suicide Risk Assessment (Signed)
Suicide Risk Assessment  Admission Assessment     Nursing information obtained from:  Patient Demographic factors:  NA Current Mental Status:  NA Loss Factors:  Decline in physical health Historical Factors:  Prior suicide attempts Risk Reduction Factors:  Employed  CLINICAL FACTORS:   Depression:   Comorbid alcohol abuse/dependence Alcohol/Substance Abuse/Dependencies  COGNITIVE FEATURES THAT CONTRIBUTE TO RISK:  Closed-mindedness Polarized thinking Thought constriction (tunnel vision)    SUICIDE RISK:   Moderate:  Frequent suicidal ideation with limited intensity, and duration, some specificity in terms of plans, no associated intent, good self-control, limited dysphoria/symptomatology, some risk factors present, and identifiable protective factors, including available and accessible social support.  PLAN OF CARE: Supportive approach/coping skills/relapse prevention                               Reassess and address the co morbidities  I certify that inpatient services furnished can reasonably be expected to improve the patient's condition.  Philipe Laswell A 08/08/2013, 5:48 PM

## 2013-08-08 NOTE — BHH Suicide Risk Assessment (Signed)
B[facility] INPATIENT:  Family/Significant Other Suicide Prevention Education  Suicide Prevention Education:  Contact Attempts: Herbie Saxon (pt's daughter) [phone] has been identified by the patient as the family member/significant other with whom the patient will be residing, and identified as the person(s) who will aid the patient in the event of a mental health crisis.  With written consent from the patient, two attempts were made to provide suicide prevention education, prior to and/or following the patient's discharge.  We were unsuccessful in providing suicide prevention education.  A suicide education pamphlet was given to the patient to share with family/significant other. She was also encouraged to ask questions and talk about any concerns relating to SPE.  Date and time of first attempt: 08/08/13 9:45AM Date and time of second attempt: 08/08/13 12:45PM (unable to leave voicemail)  Smart, Lebron Quam  08/08/2013, 12:35 PM

## 2013-08-09 DIAGNOSIS — F1020 Alcohol dependence, uncomplicated: Principal | ICD-10-CM

## 2013-08-09 DIAGNOSIS — F321 Major depressive disorder, single episode, moderate: Secondary | ICD-10-CM

## 2013-08-09 DIAGNOSIS — F411 Generalized anxiety disorder: Secondary | ICD-10-CM

## 2013-08-09 LAB — GLUCOSE, CAPILLARY: Glucose-Capillary: 207 mg/dL — ABNORMAL HIGH (ref 70–99)

## 2013-08-09 NOTE — Tx Team (Addendum)
Interdisciplinary Treatment Plan Update (Adult)  Date: 08/09/2013   Time Reviewed: 10:35 AM  Progress in Treatment:  Attending groups: No.  Participating in groups:  No.  Taking medication as prescribed: Yes  Tolerating medication: Yes  Family/Significant othe contact made: Contact attempts made with pt's daughter. SPE completed with pt.   Patient understands diagnosis: Yes, AEB seeking treatment for crack cocaine use, passive SI, and mood stabilization.  Discussing patient identified problems/goals with staff: Yes  Medical problems stabilized or resolved: Yes  Denies suicidal/homicidal ideation: Pt reporting Passive SI.  Patient has not harmed self or Others: Yes  New problem(s) identified: Pt presents with depressed mood and tearful affect. She is not currently getting out of bed or participating in groups or interacting with others in the milieu.  Discharge Plan or Barriers: Pt has follow up scheduled with [facility] o/p for med management and therapy. She plans to return home where she lives with her daughter at d/c.  Additional comments: 57 Y/O female who states she used too much cocaine, her heart got racing, called the ambulance. Pulse went up to 170. Has been using on and off for 8 years twice a month binge pattern. Alcohol in the same binge pattern. States she has felt more and more depressed, overwhelmed. Due to her use she has alienated herself from hr family. She works and she is afraid she is going to lose her job. She is on Lexapro but does not seem to be working for her Reason for Continuation of Hospitalization: Librium taper-withdrawal Mood stabilization Passive SI Medication management  Estimated length of stay: 3-4 days  For review of initial/current patient goals, please see plan of care.  Attendees:  Patient:    Family:    Physician: Geoffery Lyons MD 08/09/2013 10:37 AM   Nursing: Mardella Layman RN 08/09/2013 10:38 AM   Clinical Social Worker Donnovan Stamour Smart, LCSWA  08/09/2013 10:38  AM   Other: Darden Dates Nurse CM 08/09/2013 10:38 AM   Other:    Other: Massie Kluver, Community Care Coordinator  08/09/2013 10:38 AM   Other:    Scribe for Treatment Team:  [employer] LCSWA  08/09/2013 10:38 AM

## 2013-08-09 NOTE — ED Provider Notes (Signed)
Medical screening examination/treatment/procedure(s) were performed by non-physician practitioner and as supervising physician I was immediately available for consultation/collaboration.  EKG Interpretation   None         Roney Marion, MD 08/09/13 0700

## 2013-08-09 NOTE — Clinical Social Work Note (Signed)
Per MD, pt stable for Saturday discharge, as she is planning to return to work immediately. Pt follow-up in place, discharge note completed, releases (not needed being that she is following up at [facility] o/p). Pt was asleep when CSW attempted to meet with her today. New client packet placed on her bed for her to complete prior to her first appt. CSW included in her AVS that pt must complete this packet and bring with her to first appt along with her insurance card.   [employer], LCSWA  08/09/2013 3:03 PM

## 2013-08-09 NOTE — BHH Group Notes (Signed)
[facility] LCSW Group Therapy  08/09/2013 2:34 PM  Type of Therapy:  Group Therapy  Participation Level:  Did Not Attend   Smart, Herbert Seta 08/09/2013, 2:34 PM

## 2013-08-09 NOTE — Progress Notes (Signed)
Patient ID: Lisa Anderson, female   DOB: 07-May-1956, 57 y.o.   MRN: [id number] Pt did not attend group. She stayed in bed.

## 2013-08-09 NOTE — Progress Notes (Signed)
D. Pt in bed on approach, appears very depressed.  Did not attend evening group and requested night time medication as soon as possible to be given.  Denies SI/HI/hallucinations at this time.  Minimal interaction.  A.  Support and encouragement offered, medication given as ordered  R.  Pt remains safe on unit, will continue to monitor.

## 2013-08-09 NOTE — Progress Notes (Signed)
[facility] MD Progress Note  08/09/2013 3:14 PM Lisa Anderson  MRN:  [id number] Subjective:  Lisa Anderson has continued to work on her issues. Her mood is still depressed. She states that being paid every 2 weeks, (money) is the trigger. She does OK when she knows she is not being paid that week, but she starts anticipating next week with increased restlessness, cravings.She says she struggles with it has a battle with his brain but she ends up not wining it and gives in to the craving. She binges on cocaine and alcohol. When trough with the binge she remains depressed for couple of days, then her mood is more stable until the cycle repeats itself. States she is tired of it and this last episode with the tachycardia was very scary Diagnosis:   DSM5: Schizophrenia Disorders:  denies Obsessive-Compulsive Disorders:  denies Trauma-Stressor Disorders:  denies Substance/Addictive Disorders:  Alcohol Related Disorder - Moderate (303.90), Cocaine Related Disorders Depressive Disorders:  Major Depressive Disorder - Moderate (296.22)  Axis I: Anxiety Disorder NOS  ADL's:  Intact  Sleep: Fair  Appetite:  Fair  Suicidal Ideation:  Plan:  denies Intent:  denies Means:  denies Homicidal Ideation:  Plan:  denies Intent:  denies Means:  denies AEB (as evidenced by):  Psychiatric Specialty Exam: Review of Systems  Constitutional: Positive for malaise/fatigue.  HENT: Negative.   Eyes: Negative.   Respiratory: Negative.   Cardiovascular: Negative.   Gastrointestinal: Negative.   Genitourinary: Negative.   Musculoskeletal: Negative.   Skin: Negative.   Neurological: Positive for weakness.  Endo/Heme/Allergies: Negative.   Psychiatric/Behavioral: Positive for substance abuse. The patient is nervous/anxious.     Blood pressure 117/73, pulse 84, temperature 97.4 F (36.3 C), temperature source Oral, resp. rate 16, height 5' 7.5" (1.715 m), weight 81.194 kg (179 lb).Body mass index is 27.61 kg/(m^2).   General Appearance: Disheveled  Eye Solicitor::  Fair  Speech:  Clear and Coherent, Slow and not spontaneous  Volume:  Decreased  Mood:  Depressed  Affect:  Restricted  Thought Process:  Coherent and Goal Directed  Orientation:  Full (Time, Place, and Person)  Thought Content:  worries, concerns  Suicidal Thoughts:  No  Homicidal Thoughts:  No  Memory:  Immediate;   Fair Recent;   Fair Remote;   Fair  Judgement:  Fair  Insight:  Shallow  Psychomotor Activity:  Restlessness  Concentration:  Fair  Recall:  Fair  Akathisia:  No  Handed:    AIMS (if indicated):     Assets:  Desire for Improvement  Sleep:  Number of Hours: 6.25   Current Medications: Current Facility-Administered Medications  Medication Dose Route Frequency Provider Last Rate Last Dose  . acetaminophen (TYLENOL) tablet 650 mg  650 mg Oral Q6H PRN Kerry Hough, PA-C   650 mg at 08/08/13 0630  . albuterol (PROVENTIL HFA;VENTOLIN HFA) 108 (90 BASE) MCG/ACT inhaler 2 puff  2 puff Inhalation Q6H PRN Kerry Hough, PA-C      . alum & mag hydroxide-simeth (MAALOX/MYLANTA) 200-200-20 MG/5ML suspension 30 mL  30 mL Oral Q4H PRN Kerry Hough, PA-C      . chlordiazePOXIDE (LIBRIUM) capsule 25 mg  25 mg Oral Q6H PRN Rachael Fee, MD   25 mg at 08/08/13 0630  . diltiazem (DILACOR XR) 24 hr capsule 240 mg  240 mg Oral q morning - 10a Kerry Hough, PA-C   240 mg at 08/09/13 7829  . DULoxetine (CYMBALTA) DR capsule 30 mg  30 mg  Oral Daily Rachael Fee, MD   30 mg at 08/09/13 [phone]  . lisinopril (PRINIVIL,ZESTRIL) tablet 20 mg  20 mg Oral Daily Rachael Fee, MD   20 mg at 08/09/13 9604   And  . hydrochlorothiazide (HYDRODIURIL) tablet 25 mg  25 mg Oral Daily Rachael Fee, MD   25 mg at 08/09/13 0851  . hydrOXYzine (ATARAX/VISTARIL) tablet 25 mg  25 mg Oral Q6H PRN Kerry Hough, PA-C   25 mg at 08/07/13 2223  . insulin aspart (novoLOG) injection 0-15 Units  0-15 Units Subcutaneous TID WC Kerry Hough, PA-C   3  Units at 08/09/13 1215  . insulin aspart (novoLOG) injection 0-5 Units  0-5 Units Subcutaneous QHS Kerry Hough, PA-C   2 Units at 08/07/13 2227  . insulin aspart (novoLOG) injection 4 Units  4 Units Subcutaneous TID WC Kerry Hough, PA-C   4 Units at 08/09/13 1207  . loperamide (IMODIUM) capsule 2-4 mg  2-4 mg Oral PRN Kerry Hough, PA-C      . metFORMIN (GLUCOPHAGE) tablet 1,000 mg  1,000 mg Oral BID WC Kerry Hough, PA-C   1,000 mg at 08/09/13 0851  . mometasone-formoterol (DULERA) 100-5 MCG/ACT inhaler 2 puff  2 puff Inhalation BID Kerry Hough, PA-C   2 puff at 08/09/13 [phone]  . multivitamin with minerals tablet 1 tablet  1 tablet Oral Daily Kerry Hough, PA-C   1 tablet at 08/09/13 8119  . ondansetron (ZOFRAN-ODT) disintegrating tablet 4 mg  4 mg Oral Q6H PRN Kerry Hough, PA-C      . ramelteon (ROZEREM) tablet 8 mg  8 mg Oral QHS Rachael Fee, MD   8 mg at 08/08/13 2135  . thiamine (B-1) injection 100 mg  100 mg Intramuscular Once Spencer E Simon, PA-C      . thiamine (VITAMIN B-1) tablet 100 mg  100 mg Oral Daily Kerry Hough, PA-C   100 mg at 08/09/13 1478    Lab Results:  Results for orders placed during the hospital encounter of 08/07/13 (from the past 48 hour(s))  GLUCOSE, CAPILLARY     Status: Abnormal   Collection Time    08/07/13  9:40 PM      Result Value Range   Glucose-Capillary 202 (*) 70 - 99 mg/dL  GLUCOSE, CAPILLARY     Status: Abnormal   Collection Time    08/08/13  6:12 AM      Result Value Range   Glucose-Capillary 128 (*) 70 - 99 mg/dL  HEMOGLOBIN G9F     Status: Abnormal   Collection Time    08/08/13  6:20 AM      Result Value Range   Hemoglobin A1C 8.1 (*) <5.7 %   Comment: (NOTE)                                                                               According to the ADA Clinical Practice Recommendations for 2011, when     HbA1c is used as a screening test:      >=6.5%   Diagnostic of Diabetes Mellitus               (  if  abnormal result is confirmed)     5.7-6.4%   Increased risk of developing Diabetes Mellitus     References:Diagnosis and Classification of Diabetes Mellitus,Diabetes     Care,2011,34(Suppl 1):S62-S69 and Standards of Medical Care in             Diabetes - 2011,Diabetes Care,2011,34 (Suppl 1):S11-S61.   Mean Plasma Glucose 186 (*) <117 mg/dL   Comment: Performed at [facility]  TSH     Status: None   Collection Time    08/08/13  6:20 AM      Result Value Range   TSH 0.497  0.350 - 4.500 uIU/mL   Comment: Performed at [facility]  GLUCOSE, CAPILLARY     Status: Abnormal   Collection Time    08/08/13 12:03 PM      Result Value Range   Glucose-Capillary 146 (*) 70 - 99 mg/dL  GLUCOSE, CAPILLARY     Status: Abnormal   Collection Time    08/08/13  4:48 PM      Result Value Range   Glucose-Capillary 149 (*) 70 - 99 mg/dL  GLUCOSE, CAPILLARY     Status: Abnormal   Collection Time    08/08/13  9:21 PM      Result Value Range   Glucose-Capillary 180 (*) 70 - 99 mg/dL  GLUCOSE, CAPILLARY     Status: Abnormal   Collection Time    08/09/13  6:09 AM      Result Value Range   Glucose-Capillary 207 (*) 70 - 99 mg/dL    Physical Findings: AIMS: Facial and Oral Movements Muscles of Facial Expression: None, normal Lips and Perioral Area: None, normal Jaw: None, normal Tongue: None, normal,Extremity Movements Upper (arms, wrists, hands, fingers): None, normal Lower (legs, knees, ankles, toes): None, normal, Trunk Movements Neck, shoulders, hips: None, normal, Overall Severity Severity of abnormal movements (highest score from questions above): None, normal Incapacitation due to abnormal movements: None, normal Patient's awareness of abnormal movements (rate only patient's report): No Awareness, Dental Status Current problems with teeth and/or dentures?: Yes Does patient usually wear dentures?: Yes  CIWA:  CIWA-Ar Total: 0 COWS:  COWS Total Score: 4  Treatment Plan  Summary: Daily contact with patient to assess and evaluate symptoms and progress in treatment Medication management  Plan: Supportive approach/coping skills/relapse prevention           CBT;mindfulness           D/C Librium : too sedating and given the drinking pattern, probably does not need it.            Note: wants to be D/C in AM in order to be ready to go back to work that evening.  Medical Decision Making Problem Points:  Review of psycho-social stressors (1) Data Points:  Review of medication regiment & side effects (2)  I certify that inpatient services furnished can reasonably be expected to improve the patient's condition.   Lashaundra Lehrmann A 08/09/2013, 3:14 PM

## 2013-08-09 NOTE — Progress Notes (Signed)
Patient ID: Lisa Anderson, female   DOB: 10/14/1955, 57 y.o.   MRN: [id number] D. Patient presents with depressed mood, affect flat. Patient continues to be isolative to room throughout most of shift thus far, no interaction with peers and patient is not forthcoming with Clinical research associate. In am patient states ''I'm weak and tired, I don't feel like talking. I just feel depressed. '' Patient declined to complete self inventory today. Patient continues to be very withdrawn, quiet and despondent throughout shift. A. Support and encouragement provided. Medications given as ordered. Discussed above information with MD and treatment team. R. Patient remains cooperative at this time. In no acute distress. Pt remains safe, will continue to monitor q 15 minutes for safety.

## 2013-08-09 NOTE — BHH Group Notes (Signed)
[facility] LCSW Aftercare Discharge Planning Group Note   08/09/2013 9:42 AM  Participation Quality:  DID NOT ATTEND   Smart, Lisa Anderson

## 2013-08-09 NOTE — Progress Notes (Addendum)
[facility] Adult Case Management Discharge Plan :  Will you be returning to the same living situation after discharge: Yes,  home with daughter At discharge, do you have transportation home?:Yes,  family/friend Do you have the ability to pay for your medications:Yes,  [employer] private insurance  Consents not needed, as pt will be following up with [facility]. **  Patient to Follow up at: Follow-up Information   Follow up with [facility] On 09/05/2013. (Appt. with Dr. Lolly Mustache at 8:30AM for medication management/hospital follow-up. Make sure to bring insurance card and completed packet. )    Contact information:   [address] [place], [place] [address] Phone: [phone] Fax: [phone]      Follow up with [facility] On 09/09/2013. (Appt. with Boneta Lucks at 9:00AM for therapy. )    Contact information:   [address] [place], [place] [address] Phone: [phone] Fax: [phone]      Patient denies SI/HI:   Yes,  self report.    Safety Planning and Suicide Prevention discussed:  Yes,  Contact attempts made with pt's daughter (voicemail left). SPE completed with pt and she was encouraged to ask questions, talk about any concerns, and share information with her support network. SPI pamphlet provided to pt.   Smart, Cedrick Partain LCSWA 08/09/2013, 3:00 PM

## 2013-08-09 NOTE — Progress Notes (Signed)
Adult Psychoeducational Group Note  Date:  08/09/2013 Time:  10:44 AM  Group Topic/Focus:  Therapeutic Activity  Participation Level:  Did Not Attend  Lisa Anderson 08/09/2013, 10:44 AM

## 2013-08-10 DIAGNOSIS — F1020 Alcohol dependence, uncomplicated: Secondary | ICD-10-CM | POA: Diagnosis present

## 2013-08-10 DIAGNOSIS — F1994 Other psychoactive substance use, unspecified with psychoactive substance-induced mood disorder: Secondary | ICD-10-CM

## 2013-08-10 DIAGNOSIS — F1910 Other psychoactive substance abuse, uncomplicated: Secondary | ICD-10-CM

## 2013-08-10 DIAGNOSIS — F332 Major depressive disorder, recurrent severe without psychotic features: Secondary | ICD-10-CM

## 2013-08-10 LAB — GLUCOSE, CAPILLARY
Glucose-Capillary: 102 mg/dL — ABNORMAL HIGH (ref 70–99)
Glucose-Capillary: 124 mg/dL — ABNORMAL HIGH (ref 70–99)

## 2013-08-10 MED ORDER — RAMELTEON 8 MG PO TABS: 8.0000 mg | ORAL_TABLET | Freq: Every day | ORAL | Status: DC

## 2013-08-10 MED ORDER — DILTIAZEM HCL ER 240 MG PO CP24: 240.0000 mg | ORAL_CAPSULE | Freq: Every morning | ORAL | Status: DC

## 2013-08-10 MED ORDER — METFORMIN HCL 500 MG PO TABS: 1000.0000 mg | ORAL_TABLET | Freq: Two times a day (BID) | ORAL | Status: DC

## 2013-08-10 MED ORDER — HYDROXYZINE HCL 25 MG PO TABS: 25.0000 mg | ORAL_TABLET | Freq: Four times a day (QID) | ORAL | Status: DC | PRN

## 2013-08-10 MED ORDER — LISINOPRIL-HYDROCHLOROTHIAZIDE 20-25 MG PO TABS: 1.0000 | ORAL_TABLET | Freq: Every day | ORAL | Status: DC

## 2013-08-10 MED ORDER — DULOXETINE HCL 30 MG PO CPEP: 30.0000 mg | ORAL_CAPSULE | Freq: Every day | ORAL | Status: DC

## 2013-08-10 NOTE — Discharge Summary (Signed)
Physician Discharge Summary Note  Patient:  Lisa Anderson is an 57 y.o., female MRN:  [id number] DOB:  September 20, 1956 Patient phone:  [phone] (home)  Patient address:   [address] [place] [address],   Date of Admission:  08/07/2013 Date of Discharge: 08/10/2014  Reason for Admission:  Alcohol dependency/detox, depression  Discharge Diagnoses: Principal Problem:   Alcohol dependency Active Problems:   Cocaine abuse   Alcohol abuse   MDD (major depressive disorder)  Review of Systems  Constitutional: Negative.   HENT: Negative.   Eyes: Negative.   Respiratory: Negative.   Cardiovascular: Negative.   Gastrointestinal: Negative.   Genitourinary: Negative.   Musculoskeletal: Negative.   Skin: Negative.   Neurological: Negative.   Endo/Heme/Allergies: Negative.   Psychiatric/Behavioral: Positive for substance abuse. The patient is nervous/anxious.     DSM5:  Substance/Addictive Disorders:  Alcohol Related Disorder - Severe (303.90) and Alcohol Intoxication with Use Disorder - Severe (F10.229) Depressive Disorders:  Major Depressive Disorder - Severe (296.23)  Axis Diagnosis:   AXIS I:  Alcohol Abuse, Anxiety Disorder NOS, Major Depression, Recurrent severe, Substance Abuse and Substance Induced Mood Disorder AXIS II:  Deferred AXIS III:   Past Medical History  Diagnosis Date  . Hypertension   . Diabetes mellitus   . Hyperlipidemia   . Asthma    AXIS IV:  other psychosocial or environmental problems, problems related to social environment and problems with primary support group AXIS V:  61-70 mild symptoms  Level of Care:  OP  Hospital Course:  On admission:  57 Y/O female who states she used too much cocaine, her heart got racing, called the ambulance. Pulse went up to 170. Has been using on and off for 8 years twice a month binge pattern. Alcohol in the same binge pattern. States she has felt more and more depressed, overwhelmed. Due to her use she  has alienated herself from hr family. She works and she is afraid she is going to lose her job. She is on Lexapro but does not seem to be working for her  During hospitalization:  Medications managed--[facility] alcohol detox protocol implemented successfully.  Her Lexapro 20 mg daily for depression not continued, Cymbalta 30 mg for depression started along with Vistaril 25 mg every six hours PRN anxiety and Rozerem 8 mg at bedtime for sleep.  Her home medications for diabetes, asthma, and blood pressure continued.  Lisa Anderson attended and participated in therapy.  Her coping mechanisms to avoid substance abuse are to go to NA, obtain a sponsor, stay away from negative influences, and go back to work.  She denied suicidal/homicidal ideations and auditory/visual hallucinations, follow-up appointments encouraged to attend, outside support groups encouraged and information given, Rx given.  Lisa Anderson is mentally and physically stable for discharge.  Consults:  None  Significant Diagnostic Studies:  labs: Competed, reviewed, stable  Discharge Vitals:   Blood pressure 126/79, pulse 82, temperature 97.9 F (36.6 C), temperature source Oral, resp. rate 16, height 5' 7.5" (1.715 m), weight 81.194 kg (179 lb). Body mass index is 27.61 kg/(m^2). Lab Results:   Results for orders placed during the hospital encounter of 08/07/13 (from the past 72 hour(s))  GLUCOSE, CAPILLARY     Status: Abnormal   Collection Time    08/07/13  9:40 PM      Result Value Range   Glucose-Capillary 202 (*) 70 - 99 mg/dL  GLUCOSE, CAPILLARY     Status: Abnormal   Collection Time    08/08/13  6:12 AM      Result Value Range   Glucose-Capillary 128 (*) 70 - 99 mg/dL  HEMOGLOBIN Z6X     Status: Abnormal   Collection Time    08/08/13  6:20 AM      Result Value Range   Hemoglobin A1C 8.1 (*) <5.7 %   Comment: (NOTE)                                                                               According to the ADA Clinical Practice  Recommendations for 2011, when     HbA1c is used as a screening test:      >=6.5%   Diagnostic of Diabetes Mellitus               (if abnormal result is confirmed)     5.7-6.4%   Increased risk of developing Diabetes Mellitus     References:Diagnosis and Classification of Diabetes Mellitus,Diabetes     Care,2011,34(Suppl 1):S62-S69 and Standards of Medical Care in             Diabetes - 2011,Diabetes Care,2011,34 (Suppl 1):S11-S61.   Mean Plasma Glucose 186 (*) <117 mg/dL   Comment: Performed at [facility]  TSH     Status: None   Collection Time    08/08/13  6:20 AM      Result Value Range   TSH 0.497  0.350 - 4.500 uIU/mL   Comment: Performed at [facility]  GLUCOSE, CAPILLARY     Status: Abnormal   Collection Time    08/08/13 12:03 PM      Result Value Range   Glucose-Capillary 146 (*) 70 - 99 mg/dL  GLUCOSE, CAPILLARY     Status: Abnormal   Collection Time    08/08/13  4:48 PM      Result Value Range   Glucose-Capillary 149 (*) 70 - 99 mg/dL  GLUCOSE, CAPILLARY     Status: Abnormal   Collection Time    08/08/13  9:21 PM      Result Value Range   Glucose-Capillary 180 (*) 70 - 99 mg/dL  GLUCOSE, CAPILLARY     Status: Abnormal   Collection Time    08/09/13  6:09 AM      Result Value Range   Glucose-Capillary 207 (*) 70 - 99 mg/dL  GLUCOSE, CAPILLARY     Status: Abnormal   Collection Time    08/09/13  9:37 PM      Result Value Range   Glucose-Capillary 120 (*) 70 - 99 mg/dL  GLUCOSE, CAPILLARY     Status: Abnormal   Collection Time    08/10/13  6:07 AM      Result Value Range   Glucose-Capillary 102 (*) 70 - 99 mg/dL    Physical Findings: AIMS: Facial and Oral Movements Muscles of Facial Expression: None, normal Lips and Perioral Area: None, normal Jaw: None, normal Tongue: None, normal,Extremity Movements Upper (arms, wrists, hands, fingers): None, normal Lower (legs, knees, ankles, toes): None, normal, Trunk Movements Neck, shoulders, hips:  None, normal, Overall Severity Severity of abnormal movements (highest score from questions above): None, normal Incapacitation due to abnormal movements: None, normal Patient's awareness of abnormal  movements (rate only patient's report): No Awareness, Dental Status Current problems with teeth and/or dentures?: Yes Does patient usually wear dentures?: Yes  CIWA:  CIWA-Ar Total: 1 COWS:  COWS Total Score: 4  Psychiatric Specialty Exam: See Psychiatric Specialty Exam and Suicide Risk Assessment completed by Attending Physician prior to discharge.  Discharge destination:  Home  Is patient on multiple antipsychotic therapies at discharge:  No   Has Patient had three or more failed trials of antipsychotic monotherapy by history:  No  Recommended Plan for Multiple Antipsychotic Therapies: NA  Discharge Orders   Future Orders Complete By Expires   Activity as tolerated - No restrictions  As directed    Diet - low sodium heart healthy  As directed        Medication List    STOP taking these medications       escitalopram 20 MG tablet  Commonly known as:  LEXAPRO      TAKE these medications     Indication   ADVAIR DISKUS 100-50 MCG/DOSE Aepb  Generic drug:  Fluticasone-Salmeterol  Inhale 1 puff into the lungs every morning.   Indication:  Asthma     albuterol 108 (90 BASE) MCG/ACT inhaler  Commonly known as:  PROVENTIL HFA;VENTOLIN HFA  Inhale 2 puffs into the lungs every 6 (six) hours as needed for wheezing or shortness of breath. Shortness of breath      diltiazem 240 MG 24 hr capsule  Commonly known as:  DILACOR XR  Take 1 capsule (240 mg total) by mouth every morning.   Indication:  High Blood Pressure     DULoxetine 30 MG capsule  Commonly known as:  CYMBALTA  Take 1 capsule (30 mg total) by mouth daily.   Indication:  Major Depressive Disorder     hydrOXYzine 25 MG tablet  Commonly known as:  ATARAX/VISTARIL  Take 1 tablet (25 mg total) by mouth every 6 (six)  hours as needed for anxiety (or CIWA score </= 10).      insulin aspart 100 UNIT/ML injection  Commonly known as:  novoLOG  Inject 15 Units into the skin 2 (two) times daily.   Indication:  Type 2 Diabetes     insulin aspart 100 UNIT/ML injection  Commonly known as:  novoLOG  Inject 4 Units into the skin 3 (three) times daily with meals.   Indication:  Type 2 Diabetes     lisinopril-hydrochlorothiazide 20-25 MG per tablet  Commonly known as:  PRINZIDE,ZESTORETIC  Take 1 tablet by mouth daily.   Indication:  High Blood Pressure     metFORMIN 500 MG tablet  Commonly known as:  GLUCOPHAGE  Take 2 tablets (1,000 mg total) by mouth 2 (two) times daily with a meal.   Indication:  Type 2 Diabetes     ramelteon 8 MG tablet  Commonly known as:  ROZEREM  Take 1 tablet (8 mg total) by mouth at bedtime.   Indication:  Trouble Sleeping           Follow-up Information   Follow up with [facility] On 09/05/2013. (Appt. with Dr. Lolly Mustache at 8:30AM for medication management/hospital follow-up. Make sure to bring insurance card and completed packet. )    Contact information:   [address] [place], [place] [address] Phone: [phone] Fax: [phone]      Follow up with [facility] On 09/09/2013. (Appt. with Boneta Lucks at 9:00AM for therapy. )    Contact information:   [address] [place],  [place] [address] Phone: [phone] Fax: [phone]      Follow-up recommendations:  Activity:  as tolerated Diet:  low-sodium heart healthy diet  Comments:  Patient will continue her care at [facility] outpatient.  Total Discharge Time:  Greater than 30 minutes.  SignedNanine Means, PMH-NP 08/10/2013, 9:29 AM

## 2013-08-10 NOTE — [facility] Group Notes (Signed)
[facility] Group Notes: (Clinical Social Work)   08/10/2013      Type of Therapy:  Group Therapy   Participation Level:  Did Not Attend    Ambrose Mantle, LCSW 08/10/2013, 12:10 PM

## 2013-08-10 NOTE — [facility] Suicide Risk Assessment (Signed)
Suicide Risk Assessment  Discharge Assessment     Demographic Factors:  Adolescent or young adult, Caucasian and Low socioeconomic status  Mental Status Per Nursing Assessment::   On Admission:  NA  Current Mental Status by Physician: Patient is calm, quiet and cooperative. Patient has a decreased psychomotor activity. Patient has fine motor rate there are constricted affect. Patient has no suicidal or homicidal ideations, intentions or plans. Patient has no evidence of psychotic symptoms. Patient has no withdrawal symptoms.  Loss Factors: Financial problems/change in socioeconomic status  Historical Factors: Prior suicide attempts, Family history of mental illness or substance abuse and Impulsivity  Risk Reduction Factors:   Sense of responsibility to family, Religious beliefs about death, Living with another person, especially a relative, Positive social support, Positive therapeutic relationship and Positive coping skills or problem solving skills  Continued Clinical Symptoms:  Depression:   Impulsivity Recent sense of peace/wellbeing Alcohol/Substance Abuse/Dependencies Previous Psychiatric Diagnoses and Treatments Medical Diagnoses and Treatments/Surgeries  Cognitive Features That Contribute To Risk:  Polarized thinking    Suicide Risk:  Minimal: No identifiable suicidal ideation.  Patients presenting with no risk factors but with morbid ruminations; may be classified as minimal risk based on the severity of the depressive symptoms  Discharge Diagnoses:   AXIS I:  Major Depression, Recurrent severe, Substance Induced Mood Disorder and Alcohol abuse and cocaine abuse AXIS II:  Deferred AXIS III:   Past Medical History  Diagnosis Date  . Hypertension   . Diabetes mellitus   . Hyperlipidemia   . Asthma    AXIS IV:  occupational problems, other psychosocial or environmental problems, problems related to social environment and problems with primary support group AXIS  V:  61-70 mild symptoms  Plan Of Care/Follow-up recommendations:  Activity:  As tolerated Diet:  Regular  Is patient on multiple antipsychotic therapies at discharge:  No   Has Patient had three or more failed trials of antipsychotic monotherapy by history:  No  Recommended Plan for Multiple Antipsychotic Therapies: NA  Nehemiah Settle., M.D. 08/10/2013, 12:21 PM

## 2013-08-12 LAB — GLUCOSE, CAPILLARY: Glucose-Capillary: 114 mg/dL — ABNORMAL HIGH (ref 70–99)

## 2013-08-15 NOTE — Progress Notes (Signed)
Patient Discharge Instructions:  Next Level Care Provider Has Access to the EMR, 08/15/13 Records provided to [facility] via [facility]/Epic access.   Jerelene Redden, 08/15/2013, 1:55 PM

## 2013-08-21 NOTE — ED Provider Notes (Signed)
I personally performed the services described in this documentation, which was scribed in my presence. The recorded information has been reviewed and is accurate.   Roney Marion, MD 08/21/13 [phone]

## 2013-09-05 ENCOUNTER — Ambulatory Visit (HOSPITAL_COMMUNITY): Payer: Self-pay | Admitting: Psychiatry

## 2013-09-09 ENCOUNTER — Ambulatory Visit (HOSPITAL_COMMUNITY): Payer: Self-pay | Admitting: Psychiatry

## 2014-01-03 ENCOUNTER — Ambulatory Visit (HOSPITAL_COMMUNITY): Payer: Self-pay | Admitting: Psychiatry

## 2015-08-05 ENCOUNTER — Inpatient Hospital Stay (HOSPITAL_COMMUNITY): Payer: PRIVATE HEALTH INSURANCE

## 2015-08-05 ENCOUNTER — Encounter (HOSPITAL_COMMUNITY): Payer: Self-pay | Admitting: Emergency Medicine

## 2015-08-05 ENCOUNTER — Emergency Department (HOSPITAL_COMMUNITY): Payer: PRIVATE HEALTH INSURANCE

## 2015-08-05 ENCOUNTER — Inpatient Hospital Stay (HOSPITAL_COMMUNITY)
Admission: EM | Admit: 2015-08-05 | Discharge: 2015-08-07 | DRG: 042 | Disposition: A | Discharge status: Home / Self Care | Payer: PRIVATE HEALTH INSURANCE | Attending: Internal Medicine | Admitting: Internal Medicine

## 2015-08-05 DIAGNOSIS — I63411 Cerebral infarction due to embolism of right middle cerebral artery: Secondary | ICD-10-CM | POA: Diagnosis not present

## 2015-08-05 DIAGNOSIS — Z8249 Family history of ischemic heart disease and other diseases of the circulatory system: Secondary | ICD-10-CM

## 2015-08-05 DIAGNOSIS — H538 Other visual disturbances: Secondary | ICD-10-CM | POA: Diagnosis present

## 2015-08-05 DIAGNOSIS — Z9114 Patient's other noncompliance with medication regimen: Secondary | ICD-10-CM

## 2015-08-05 DIAGNOSIS — J45909 Unspecified asthma, uncomplicated: Secondary | ICD-10-CM | POA: Diagnosis present

## 2015-08-05 DIAGNOSIS — E785 Hyperlipidemia, unspecified: Secondary | ICD-10-CM | POA: Insufficient documentation

## 2015-08-05 DIAGNOSIS — F329 Major depressive disorder, single episode, unspecified: Secondary | ICD-10-CM | POA: Diagnosis present

## 2015-08-05 DIAGNOSIS — Z823 Family history of stroke: Secondary | ICD-10-CM

## 2015-08-05 DIAGNOSIS — E1159 Type 2 diabetes mellitus with other circulatory complications: Secondary | ICD-10-CM | POA: Diagnosis not present

## 2015-08-05 DIAGNOSIS — E1310 Other specified diabetes mellitus with ketoacidosis without coma: Secondary | ICD-10-CM | POA: Diagnosis not present

## 2015-08-05 DIAGNOSIS — Z8673 Personal history of transient ischemic attack (TIA), and cerebral infarction without residual deficits: Secondary | ICD-10-CM | POA: Diagnosis present

## 2015-08-05 DIAGNOSIS — Z833 Family history of diabetes mellitus: Secondary | ICD-10-CM | POA: Diagnosis not present

## 2015-08-05 DIAGNOSIS — F1010 Alcohol abuse, uncomplicated: Secondary | ICD-10-CM | POA: Diagnosis not present

## 2015-08-05 DIAGNOSIS — E119 Type 2 diabetes mellitus without complications: Secondary | ICD-10-CM | POA: Diagnosis present

## 2015-08-05 DIAGNOSIS — F17200 Nicotine dependence, unspecified, uncomplicated: Secondary | ICD-10-CM | POA: Diagnosis present

## 2015-08-05 DIAGNOSIS — I639 Cerebral infarction, unspecified: Secondary | ICD-10-CM | POA: Diagnosis present

## 2015-08-05 DIAGNOSIS — Z7984 Long term (current) use of oral hypoglycemic drugs: Secondary | ICD-10-CM | POA: Diagnosis not present

## 2015-08-05 DIAGNOSIS — Z888 Allergy status to other drugs, medicaments and biological substances status: Secondary | ICD-10-CM

## 2015-08-05 DIAGNOSIS — Z79899 Other long term (current) drug therapy: Secondary | ICD-10-CM

## 2015-08-05 DIAGNOSIS — I63311 Cerebral infarction due to thrombosis of right middle cerebral artery: Secondary | ICD-10-CM

## 2015-08-05 DIAGNOSIS — Z794 Long term (current) use of insulin: Secondary | ICD-10-CM | POA: Diagnosis not present

## 2015-08-05 DIAGNOSIS — I10 Essential (primary) hypertension: Secondary | ICD-10-CM | POA: Diagnosis present

## 2015-08-05 DIAGNOSIS — I63019 Cerebral infarction due to thrombosis of unspecified vertebral artery: Secondary | ICD-10-CM | POA: Diagnosis not present

## 2015-08-05 DIAGNOSIS — F1410 Cocaine abuse, uncomplicated: Secondary | ICD-10-CM | POA: Diagnosis present

## 2015-08-05 DIAGNOSIS — R002 Palpitations: Secondary | ICD-10-CM | POA: Insufficient documentation

## 2015-08-05 DIAGNOSIS — I63431 Cerebral infarction due to embolism of right posterior cerebral artery: Secondary | ICD-10-CM | POA: Diagnosis present

## 2015-08-05 DIAGNOSIS — F1020 Alcohol dependence, uncomplicated: Secondary | ICD-10-CM | POA: Diagnosis present

## 2015-08-05 DIAGNOSIS — I6789 Other cerebrovascular disease: Secondary | ICD-10-CM | POA: Diagnosis not present

## 2015-08-05 DIAGNOSIS — Z91013 Allergy to seafood: Secondary | ICD-10-CM

## 2015-08-05 HISTORY — DX: Malignant (primary) neoplasm, unspecified: C80.1

## 2015-08-05 LAB — CBC
HCT: 42.7 % (ref 36.0–46.0)
HEMATOCRIT: 39.2 % (ref 36.0–46.0)
HEMOGLOBIN: 14.1 g/dL (ref 12.0–15.0)
HEMOGLOBIN: 12.7 g/dL (ref 12.0–15.0)
MCH: 28.1 pg (ref 26.0–34.0)
MCH: 27.6 pg (ref 26.0–34.0)
MCHC: 33 g/dL (ref 30.0–36.0)
MCHC: 32.4 g/dL (ref 30.0–36.0)
MCV: 85.2 fL (ref 78.0–100.0)
MCV: 85.2 fL (ref 78.0–100.0)
PLATELETS: 295 10*3/uL (ref 150–400)
Platelets: 284 10*3/uL (ref 150–400)
RBC: 5.01 MIL/uL (ref 3.87–5.11)
RBC: 4.6 MIL/uL (ref 3.87–5.11)
RDW: 12.5 % (ref 11.5–15.5)
RDW: 12.5 % (ref 11.5–15.5)
WBC: 9.1 10*3/uL (ref 4.0–10.5)
WBC: 8.4 10*3/uL (ref 4.0–10.5)

## 2015-08-05 LAB — BASIC METABOLIC PANEL
Anion gap: 7 (ref 5–15)
BUN: 12 mg/dL (ref 6–20)
CALCIUM: 9.1 mg/dL (ref 8.9–10.3)
CHLORIDE: 100 mmol/L — AB (ref 101–111)
CO2: 31 mmol/L (ref 22–32)
CREATININE: 0.9 mg/dL (ref 0.44–1.00)
GFR calc non Af Amer: >60 mL/min (ref >60)
GLUCOSE: 166 mg/dL — AB (ref 65–99)
Potassium: 3.4 mmol/L — ABNORMAL LOW (ref 3.5–5.1)
Sodium: 138 mmol/L (ref 135–145)

## 2015-08-05 LAB — DIFFERENTIAL
BASOS ABS: 0.1 10*3/uL (ref 0.0–0.1)
Basophils Relative: 1 %
EOS PCT: 6 %
Eosinophils Absolute: 0.5 10*3/uL (ref 0.0–0.7)
LYMPHS ABS: 3.2 10*3/uL (ref 0.7–4.0)
LYMPHS PCT: 35 %
Monocytes Absolute: 0.7 10*3/uL (ref 0.1–1.0)
Monocytes Relative: 7 %
NEUTROS ABS: 4.7 10*3/uL (ref 1.7–7.7)
NEUTROS PCT: 51 %

## 2015-08-05 LAB — URINALYSIS, ROUTINE W REFLEX MICROSCOPIC
Bilirubin Urine: NEGATIVE
GLUCOSE, UA: NEGATIVE mg/dL
HGB URINE DIPSTICK: NEGATIVE
Ketones, ur: NEGATIVE mg/dL
LEUKOCYTES UA: NEGATIVE
Nitrite: NEGATIVE
PH: 7.5 (ref 5.0–8.0)
Protein, ur: NEGATIVE mg/dL
Specific Gravity, Urine: 1.002 — ABNORMAL LOW (ref 1.005–1.030)
Urobilinogen, UA: 0.2 mg/dL (ref 0.0–1.0)

## 2015-08-05 LAB — RAPID URINE DRUG SCREEN, HOSP PERFORMED
AMPHETAMINES: NOT DETECTED
BENZODIAZEPINES: NOT DETECTED
Barbiturates: NOT DETECTED
COCAINE: NOT DETECTED
OPIATES: NOT DETECTED
TETRAHYDROCANNABINOL: NOT DETECTED

## 2015-08-05 LAB — PROTIME-INR
INR: 1 (ref 0.00–1.49)
Prothrombin Time: 13.4 seconds (ref 11.6–15.2)

## 2015-08-05 LAB — GLUCOSE, CAPILLARY: Glucose-Capillary: 156 mg/dL — ABNORMAL HIGH (ref 65–99)

## 2015-08-05 LAB — HEPATIC FUNCTION PANEL
ALK PHOS: 113 U/L (ref 38–126)
ALT: 15 U/L (ref 14–54)
AST: 16 U/L (ref 15–41)
Albumin: 4.3 g/dL (ref 3.5–5.0)
Total Bilirubin: 0.7 mg/dL (ref 0.3–1.2)
Total Protein: 8.2 g/dL — ABNORMAL HIGH (ref 6.5–8.1)

## 2015-08-05 LAB — I-STAT TROPONIN, ED: TROPONIN I, POC: 0 ng/mL (ref 0.00–0.08)

## 2015-08-05 LAB — I-STAT CHEM 8, ED
BUN: 12 mg/dL (ref 6–20)
CALCIUM ION: 1.12 mmol/L (ref 1.12–1.23)
CHLORIDE: 98 mmol/L — AB (ref 101–111)
CREATININE: 0.9 mg/dL (ref 0.44–1.00)
GLUCOSE: 165 mg/dL — AB (ref 65–99)
HCT: 44 % (ref 36.0–46.0)
Hemoglobin: 15 g/dL (ref 12.0–15.0)
POTASSIUM: 3.5 mmol/L (ref 3.5–5.1)
Sodium: 141 mmol/L (ref 135–145)
TCO2: 28 mmol/L (ref 0–100)

## 2015-08-05 LAB — APTT: APTT: 27 s (ref 24–37)

## 2015-08-05 LAB — CREATININE, SERUM
Creatinine, Ser: 0.81 mg/dL (ref 0.44–1.00)
GFR calc Af Amer: >60 mL/min (ref >60)

## 2015-08-05 LAB — ETHANOL: Alcohol, Ethyl (B): <5 mg/dL (ref <5)

## 2015-08-05 LAB — CBG MONITORING, ED: Glucose-Capillary: 149 mg/dL — ABNORMAL HIGH (ref 65–99)

## 2015-08-05 MED ORDER — ASPIRIN 300 MG RE SUPP: 300.0000 mg | Freq: Every day | RECTAL | Status: DC

## 2015-08-05 MED ORDER — DILTIAZEM HCL ER 120 MG PO CP24
120.0000 mg | ORAL_CAPSULE | Freq: Every morning | ORAL | Status: DC
  Filled 2015-08-05: qty 1

## 2015-08-05 MED ORDER — ALBUTEROL SULFATE (2.5 MG/3ML) 0.083% IN NEBU: 3.0000 mL | INHALATION_SOLUTION | Freq: Four times a day (QID) | RESPIRATORY_TRACT | Status: DC | PRN

## 2015-08-05 MED ORDER — METFORMIN HCL 500 MG PO TABS
1000.0000 mg | ORAL_TABLET | Freq: Two times a day (BID) | ORAL | Status: DC
  Administered 2015-08-06: 1000 mg via ORAL
  Filled 2015-08-05: qty 2

## 2015-08-05 MED ORDER — ROSUVASTATIN CALCIUM 20 MG PO TABS
20.0000 mg | ORAL_TABLET | Freq: Every day | ORAL | Status: DC
  Administered 2015-08-05 – 2015-08-06 (×2): 20 mg via ORAL
  Filled 2015-08-05 (×3): qty 1

## 2015-08-05 MED ORDER — FLUOXETINE HCL 20 MG PO TABS
20.0000 mg | ORAL_TABLET | Freq: Every day | ORAL | Status: DC
  Administered 2015-08-06 – 2015-08-07 (×2): 20 mg via ORAL
  Filled 2015-08-05 (×4): qty 1

## 2015-08-05 MED ORDER — HYDROXYZINE HCL 25 MG PO TABS: 25.0000 mg | ORAL_TABLET | Freq: Four times a day (QID) | ORAL | Status: DC | PRN

## 2015-08-05 MED ORDER — DIPHENHYDRAMINE HCL 50 MG/ML IJ SOLN
25.0000 mg | Freq: Once | INTRAMUSCULAR | Status: AC
  Administered 2015-08-05: 25 mg via INTRAVENOUS
  Filled 2015-08-05: qty 1

## 2015-08-05 MED ORDER — INSULIN ASPART 100 UNIT/ML ~~LOC~~ SOLN: 5.0000 [IU] | Freq: Three times a day (TID) | SUBCUTANEOUS | Status: DC

## 2015-08-05 MED ORDER — SENNOSIDES-DOCUSATE SODIUM 8.6-50 MG PO TABS: 1.0000 | ORAL_TABLET | Freq: Every evening | ORAL | Status: DC | PRN

## 2015-08-05 MED ORDER — DULOXETINE HCL 30 MG PO CPEP
30.0000 mg | ORAL_CAPSULE | Freq: Every day | ORAL | Status: DC
  Administered 2015-08-06 – 2015-08-07 (×2): 30 mg via ORAL
  Filled 2015-08-05 (×2): qty 1

## 2015-08-05 MED ORDER — STROKE: EARLY STAGES OF RECOVERY BOOK: Freq: Once | Status: DC

## 2015-08-05 MED ORDER — ENOXAPARIN SODIUM 40 MG/0.4ML ~~LOC~~ SOLN
40.0000 mg | SUBCUTANEOUS | Status: DC
  Administered 2015-08-05 – 2015-08-06 (×2): 40 mg via SUBCUTANEOUS
  Filled 2015-08-05 (×2): qty 0.4

## 2015-08-05 MED ORDER — RAMELTEON 8 MG PO TABS
8.0000 mg | ORAL_TABLET | Freq: Every day | ORAL | Status: DC
  Administered 2015-08-05 – 2015-08-06 (×2): 8 mg via ORAL
  Filled 2015-08-05 (×3): qty 1

## 2015-08-05 MED ORDER — LABETALOL HCL 5 MG/ML IV SOLN
5.0000 mg | Freq: Once | INTRAVENOUS | Status: AC
  Administered 2015-08-05: 5 mg via INTRAVENOUS
  Filled 2015-08-05: qty 4

## 2015-08-05 MED ORDER — PROCHLORPERAZINE EDISYLATE 5 MG/ML IJ SOLN
10.0000 mg | Freq: Once | INTRAMUSCULAR | Status: AC
  Administered 2015-08-05: 10 mg via INTRAVENOUS
  Filled 2015-08-05: qty 2

## 2015-08-05 MED ORDER — LORAZEPAM 2 MG/ML IJ SOLN
1.0000 mg | Freq: Once | INTRAMUSCULAR | Status: AC
  Administered 2015-08-05: 1 mg via INTRAVENOUS
  Filled 2015-08-05: qty 1

## 2015-08-05 MED ORDER — SODIUM CHLORIDE 0.9 % IV BOLUS (SEPSIS)
1000.0000 mL | Freq: Once | INTRAVENOUS | Status: AC
  Administered 2015-08-05: 1000 mL via INTRAVENOUS

## 2015-08-05 MED ORDER — ASPIRIN 325 MG PO TABS
325.0000 mg | ORAL_TABLET | Freq: Every day | ORAL | Status: DC
  Administered 2015-08-05 – 2015-08-07 (×3): 325 mg via ORAL
  Filled 2015-08-05 (×3): qty 1

## 2015-08-05 NOTE — ED Notes (Signed)
MRI notified, will get patient in 20 minutes

## 2015-08-05 NOTE — Progress Notes (Signed)
Pt came down to mri. Pt was in the scanner for about 10 mins when pt wanted out of scanner due to claustrophobia. Pt was offered to be given meds for claustropobia. Pt stated "she did not want to go back in to scanner". Pt refused meds. Ty Hilts RT-R-MR.

## 2015-08-05 NOTE — ED Notes (Signed)
Off floor for testing 

## 2015-08-05 NOTE — ED Notes (Addendum)
Patient states her eye hand coordination has been off-went to opthalmologist and eye exam was normal-MD suggested her symptoms were neurological and to come to ED-patient states she has run out of BP, asthma meds-has been taking a co-workers Press photographer states her eyes "ache"

## 2015-08-05 NOTE — Consult Note (Addendum)
Referring Physician: Verlon Au    Chief Complaint: Change in vision  HPI: Lisa Anderson is an 59 y.o. female who reports that she has a history of HTN but has not been taking her medications recently and her BP has been high.  Reports that some time over the weekend while working began to experience a headache on the right side of her head.  She then began to have changes in her vision.  Noted that when she reached for things they were not there but were somewhat to the side of where she thought it was.  Felt her vision was blurry as well.  Saw an ophthalmologist on yesterday who recommended that she come to the ED.  She was able to find a ride today and presented.    Date last known well: Unable to determine Time last known well: Unable to determine tPA Given: No: Outside time window  MRankin: 0  Past Medical History  Diagnosis Date  . Hypertension   . Diabetes mellitus   . Hyperlipidemia   . Asthma     Past Surgical History  Procedure Laterality Date  . Tonsillectomy    . Breast lumpectomy      Family History  Problem Relation Age of Onset  . Cancer Mother   . Diabetes Mother   . Hyperlipidemia Mother   . Hypertension Mother   . Rheum arthritis Father   . Asthma Father   . Stroke Father   . Thyroid disease Father   . Thyroid disease Brother    Social History:  reports that she has been smoking.  She does not have any smokeless tobacco history on file. She reports that she drinks about 3.6 oz of alcohol per week. She reports that she uses illicit drugs ("Crack" cocaine).  Allergies:  Allergies  Allergen Reactions  . Prednisone Hives and Other (See Comments)    Made her "crazy", suicidal  . Shrimp [Shellfish Allergy] Hives and Swelling    Medications: I have reviewed the patient's current medications. Prior to Admission:  Prior to Admission medications   Medication Sig Start Date End Date Taking? Authorizing Provider  albuterol (PROVENTIL HFA;VENTOLIN HFA) 108 (90  BASE) MCG/ACT inhaler Inhale 2 puffs into the lungs every 6 (six) hours as needed for wheezing or shortness of breath. Shortness of breath   Yes Historical Provider, MD  diltiazem (DILACOR XR) 240 MG 24 hr capsule Take 1 capsule (240 mg total) by mouth every morning. 08/10/13  Yes Patrecia Pour, NP  FLUoxetine (PROZAC) 20 MG tablet Take 20 mg by mouth daily.   Yes Historical Provider, MD  hydrOXYzine (ATARAX/VISTARIL) 25 MG tablet Take 1 tablet (25 mg total) by mouth every 6 (six) hours as needed for anxiety (or CIWA score </= 10). 08/10/13  Yes Patrecia Pour, NP  insulin aspart (NOVOLOG) 100 UNIT/ML injection Inject 5-10 Units into the skin 3 (three) times daily.  08/10/13  Yes Patrecia Pour, NP  metFORMIN (GLUCOPHAGE) 500 MG tablet Take 2 tablets (1,000 mg total) by mouth 2 (two) times daily with a meal. 08/10/13  Yes Patrecia Pour, NP  rosuvastatin (CRESTOR) 20 MG tablet Take 20 mg by mouth daily.   Yes Historical Provider, MD  DULoxetine (CYMBALTA) 30 MG capsule Take 1 capsule (30 mg total) by mouth daily. 08/10/13   Patrecia Pour, NP  lisinopril-hydrochlorothiazide (PRINZIDE,ZESTORETIC) 20-25 MG per tablet Take 1 tablet by mouth daily. 08/10/13   Patrecia Pour, NP  ramelteon (ROZEREM) 8 MG tablet  Take 1 tablet (8 mg total) by mouth at bedtime. 08/10/13   Patrecia Pour, NP    ROS: History obtained from the patient  General ROS: negative for - chills, fatigue, fever, night sweats, weight gain or weight loss Psychological ROS: negative for - behavioral disorder, hallucinations, memory difficulties, mood swings or suicidal ideation Ophthalmic ROS: negative for - blurry vision, double vision, eye pain or loss of vision ENT ROS: negative for - epistaxis, nasal discharge, oral lesions, sore throat, tinnitus or vertigo Allergy and Immunology ROS: negative for - hives or itchy/watery eyes Hematological and Lymphatic ROS: negative for - bleeding problems, bruising or swollen lymph  nodes Endocrine ROS: negative for - galactorrhea, hair pattern changes, polydipsia/polyuria or temperature intolerance Respiratory ROS: negative for - cough, hemoptysis, shortness of breath or wheezing Cardiovascular ROS: palpitations Gastrointestinal ROS: negative for - abdominal pain, diarrhea, hematemesis, nausea/vomiting or stool incontinence Genito-Urinary ROS: negative for - dysuria, hematuria, incontinence or urinary frequency/urgency Musculoskeletal ROS: negative for - joint swelling or muscular weakness Neurological ROS: as noted in HPI Dermatological ROS: negative for rash and skin lesion changes  Physical Examination: Blood pressure 222/93, pulse 79, temperature 99.1 F (37.3 C), temperature source Oral, SpO2 98 %.  HEENT-  Normocephalic, no lesions, without obvious abnormality.  Normal external eye and conjunctiva.  Normal TM's bilaterally.  Normal auditory canals and external ears. Normal external nose, mucus membranes and septum.  Normal pharynx. Cardiovascular- S1, S2 normal, pulses palpable throughout   Lungs- chest clear, no wheezing, rales, normal symmetric air entry Abdomen- soft, non-tender; bowel sounds normal; no masses,  no organomegaly Extremities- no edema Lymph-no adenopathy palpable Musculoskeletal-no joint tenderness, deformity or swelling Skin-warm and dry, no hyperpigmentation, vitiligo, or suspicious lesions  Neurological Examination Mental Status: Alert, oriented, thought content appropriate.  Speech fluent without evidence of aphasia.  Able to follow 3 step commands without difficulty. Cranial Nerves: II: Discs flat bilaterally; Visual fields grossly normal, pupils equal, round, reactive to light and accommodation III,IV, VI: ptosis not present, extra-ocular motions intact bilaterally V,VII: smile symmetric, facial light touch sensation normal bilaterally VIII: hearing normal bilaterally IX,X: gag reflex present XI: bilateral shoulder shrug XII:  midline tongue extension Motor: Right : Upper extremity   5/5    Left:     Upper extremity   5/5  Lower extremity   5/5     Lower extremity   5/5 Tone and bulk:normal tone throughout; no atrophy noted Sensory: Pinprick and light touch intact throughout, bilaterally Deep Tendon Reflexes: 2+ and symmetric throughout Plantars: Right: mute   Left: mute Cerebellar: normal finger-to-nose and normal heel-to-shin testing bilaterally Gait: not tested due to safety concerns      Laboratory Studies:  Basic Metabolic Panel:  Recent Labs Lab 08/05/15 1105 08/05/15 1114  NA 138 141  K 3.4* 3.5  CL 100* 98*  CO2 31  --   GLUCOSE 166* 165*  BUN 12 12  CREATININE 0.90 0.90  CALCIUM 9.1  --     Liver Function Tests:  Recent Labs Lab 08/05/15 1105  AST 16  ALT 15  ALKPHOS 113  BILITOT 0.7  PROT 8.2*  ALBUMIN 4.3   No results for input(s): LIPASE, AMYLASE in the last 168 hours. No results for input(s): AMMONIA in the last 168 hours.  CBC:  Recent Labs Lab 08/05/15 1105 08/05/15 1114  WBC 9.1  --   NEUTROABS 4.7  --   HGB 14.1 15.0  HCT 42.7 44.0  MCV 85.2  --  PLT 295  --     Cardiac Enzymes: No results for input(s): CKTOTAL, CKMB, CKMBINDEX, TROPONINI in the last 168 hours.  BNP: Invalid input(s): POCBNP  CBG:  Recent Labs Lab 08/05/15 1059  GLUCAP 149*    Microbiology: Results for orders placed or performed during the hospital encounter of 12/15/12  Culture, blood (routine x 2)     Status: None   Collection Time: 12/15/12  2:31 PM  Result Value Ref Range Status   Specimen Description BLOOD RIGHT ARM  Final   Special Requests BOTTLES DRAWN AEROBIC AND ANAEROBIC 10CC EACH  Final   Culture  Setup Time 12/15/2012 17:41  Final   Culture NO GROWTH 5 DAYS  Final   Report Status 12/21/2012 FINAL  Final  Culture, blood (routine x 2)     Status: None   Collection Time: 12/15/12  2:39 PM  Result Value Ref Range Status   Specimen Description BLOOD LEFT ARM   Final   Special Requests BOTTLES DRAWN AEROBIC AND ANAEROBIC 10CC EACH  Final   Culture  Setup Time 12/15/2012 17:41  Final   Culture NO GROWTH 5 DAYS  Final   Report Status 12/21/2012 FINAL  Final    Coagulation Studies:  Recent Labs  08/05/15 1105  LABPROT 13.4  INR 1.00    Urinalysis:  Recent Labs Lab 08/05/15 1304  COLORURINE YELLOW  LABSPEC 1.002*  PHURINE 7.5  GLUCOSEU NEGATIVE  HGBUR NEGATIVE  BILIRUBINUR NEGATIVE  KETONESUR NEGATIVE  PROTEINUR NEGATIVE  UROBILINOGEN 0.2  NITRITE NEGATIVE  LEUKOCYTESUR NEGATIVE    Lipid Panel: No results found for: CHOL, TRIG, HDL, CHOLHDL, VLDL, LDLCALC  HgbA1C:  Lab Results  Component Value Date   HGBA1C 8.1* 08/08/2013    Urine Drug Screen:     Component Value Date/Time   LABOPIA NONE DETECTED 08/05/2015 1303   COCAINSCRNUR NONE DETECTED 08/05/2015 1303   LABBENZ NONE DETECTED 08/05/2015 1303   AMPHETMU NONE DETECTED 08/05/2015 1303   THCU NONE DETECTED 08/05/2015 1303   LABBARB NONE DETECTED 08/05/2015 1303    Alcohol Level:  Recent Labs Lab 08/05/15 1105  ETH <5    Other results: EKG: accelerated junctional rhythm at 78 bpm.  Imaging: Ct Head Wo Contrast  08/05/2015  CLINICAL DATA:  59 year old female with 5 day history of visual and proprioceptive problems. Weakness and headache. Predominantly right-sided headache with shooting pains to the left side. EXAM: CT HEAD WITHOUT CONTRAST TECHNIQUE: Contiguous axial images were obtained from the base of the skull through the vertex without intravenous contrast. COMPARISON:  Head CT 12/14/2012. FINDINGS: In the right parietal region there is low attenuation involving the cortex, best demonstrated on images 16-18 of series 2, suspicious for evolving ischemia (likely subacute), new compared to prior study 12/14/2012. Patchy areas of mild decreased attenuation are noted throughout the deep and periventricular white matter of the cerebral hemispheres bilaterally,  compatible with mild chronic microvascular ischemic disease. No other acute intracranial abnormalities. Specifically, no evidence of acute intracranial hemorrhage, no definite mass, mass effect, hydrocephalus or abnormal intra or extra-axial fluid collections. Multifocal mucosal thickening throughout the ethmoid and maxillary sinuses bilaterally. Small air-fluid level in the right maxillary sinus. Mucous retention cyst or polyp in the anterior aspect of the left maxillary sinus measuring 1.4 cm in diameter incidentally noted. Mastoids are well pneumatized. No acute displaced skull fractures are identified. IMPRESSION: 1. Area of cortical low-attenuation the right parietal region, suspicious for evolving cortically based infarct. This could be confirmed with MRI of the  brain with and without IV gadolinium. 2. Very mild chronic microvascular ischemic changes in the cerebral white matter, as above. 3. Paranasal sinus disease, as above, including a small air-fluid level in the right maxillary sinus, which could indicate acute sinusitis. Critical Value/emergent results were called by telephone at the time of interpretation on 08/05/2015 at 12:02 pm to Dr. Deno Etienne , who verbally acknowledged these results. Electronically Signed   By: Vinnie Langton M.D.   On: 08/05/2015 12:04   Mr Brain Wo Contrast  08/05/2015  CLINICAL DATA:  Stroke-like symptoms. Decreased cord nasion. History of hypertension, diabetes, and hyperlipidemia. EXAM: MRI HEAD WITHOUT CONTRAST TECHNIQUE: Multiplanar, multiecho pulse sequences of the brain and surrounding structures were obtained without intravenous contrast. COMPARISON:  Head CT 08/05/2015 FINDINGS: The examination had to be discontinued prior to completion due to patient claustrophobia. Axial and coronal diffusion and sagittal T1 weighted images were obtained. There is a small region of acute cortical and subcortical infarction in the posterior right MCA territory involving the right  parietal and occipital lobes corresponding to the low-density on CT earlier today. There is no evidence of gross hemorrhage, mass, or extra-axial fluid collection on this incomplete study. Ventricles and sulci are within normal limits in size for age. There is no midline shift. IMPRESSION: Incomplete examination. Small, acute right parieto-occipital infarct. Electronically Signed   By: Logan Bores M.D.   On: 08/05/2015 12:44    Assessment: 59 y.o. female presenting with changes in vision.  Head CT reviewed and shows cortical low attenuation in the right parietal region.  MRI of th brain personally reviewed and shows a right parieto-occipital infarct.  Patient with multiple vascular risk factors.  On no antiplatelet therapy at home.  Not compliant with antihypertensive medications.  Further work up recommended.    Stroke Risk Factors - diabetes mellitus, hyperlipidemia, hypertension and smoking  Plan: 1. HgbA1c, fasting lipid panel, ESR 2. Smoking cessation counseling 3. PT consult, OT consult, Speech consult 4. Echocardiogram 5. Carotid dopplers 6. Prophylactic therapy-Antiplatelet med: Aspirin - dose 319m daily 7. NPO until RN stroke swallow screen 8. Telemetry monitoring 9. Frequent neuro checks 10. BP control   LAlexis Goodell MD [facility] [phone]/05/2015, 2:12 PM

## 2015-08-05 NOTE — ED Notes (Signed)
Off floor for MRI.

## 2015-08-05 NOTE — ED Notes (Signed)
Patient back from MRI.

## 2015-08-05 NOTE — H&P (Signed)
[facility] History and Physical  Ronda Rajkumar QJJ:[id number] DOB: Oct 26, 1955 DOA: 08/05/2015  Referring physician: ed PCP: No primary care provider on file.  Specialists: neuro  Chief Complaint: Blurred vision  HPI: Lisa Anderson is a 59 y.o. female came to [facility] ed 08/05/2015 with  5 day history of diplopia. She states this was associated with a moderate to severe right-sided headache as well. She noticed also some alteration of her gait and this was completely new. She went to see her vision doctor yesterday and her vision was apparently found to be normal. She's never had chronic headaches like this did not feel weak on any one side and did not have any speech difficulties She also denied any rash any fever any cold any nausea and vomiting any diarrhea Because of her persisting deficits it was felt by the ophthalmologist so sore that this may be intracranial and she was sent to the ED and came on the morning of 08/05/15 for a workup. CT scan was suggestive of stroke and MRI brain was performed which showed a small right acute parieto-occipital stroke.  Thousand that she's been diabetic and had blood pressure for the past 15 years.-Has not taken her blood pressure medications in months because she ran out of them and never got refills  Also carries a history of depression, hyperlipidemia She has remote history of use of cocaine and heavier drinking about 6-8 months ago Smokes only 6-7 cigarettes a day now and started about 1 year ago Works as a Quarry manager at [employer]  Mother and father both deceased and both had multiple medical problems inclusive of hypertension, diabetes, renal failure and strokes  Patient is allergic to shrimp and crab    Past Medical History  Diagnosis Date  . Hypertension   . Diabetes mellitus   . Hyperlipidemia   . Asthma    Past Surgical History  Procedure Laterality Date  . Tonsillectomy    . Breast lumpectomy     Social History:  Social History    Social History Narrative    Allergies  Allergen Reactions  . Prednisone Hives and Other (See Comments)    Made her "crazy", suicidal  . Shrimp [Shellfish Allergy] Hives and Swelling    Family History  Problem Relation Age of Onset  . Cancer Mother   . Diabetes Mother   . Hyperlipidemia Mother   . Hypertension Mother   . Rheum arthritis Father   . Asthma Father   . Stroke Father   . Thyroid disease Father   . Thyroid disease Brother     (be sure to complete)  Prior to Admission medications   Medication Sig Start Date End Date Taking? Authorizing Provider  albuterol (PROVENTIL HFA;VENTOLIN HFA) 108 (90 BASE) MCG/ACT inhaler Inhale 2 puffs into the lungs every 6 (six) hours as needed for wheezing or shortness of breath. Shortness of breath   Yes Historical Provider, MD  diltiazem (DILACOR XR) 240 MG 24 hr capsule Take 1 capsule (240 mg total) by mouth every morning. 08/10/13  Yes Patrecia Pour, NP  FLUoxetine (PROZAC) 20 MG tablet Take 20 mg by mouth daily.   Yes Historical Provider, MD  hydrOXYzine (ATARAX/VISTARIL) 25 MG tablet Take 1 tablet (25 mg total) by mouth every 6 (six) hours as needed for anxiety (or CIWA score </= 10). 08/10/13  Yes Patrecia Pour, NP  insulin aspart (NOVOLOG) 100 UNIT/ML injection Inject 5-10 Units into the skin 3 (three) times daily.  08/10/13  Yes Asa Saunas  Lord, NP  metFORMIN (GLUCOPHAGE) 500 MG tablet Take 2 tablets (1,000 mg total) by mouth 2 (two) times daily with a meal. 08/10/13  Yes Patrecia Pour, NP  rosuvastatin (CRESTOR) 20 MG tablet Take 20 mg by mouth daily.   Yes Historical Provider, MD  DULoxetine (CYMBALTA) 30 MG capsule Take 1 capsule (30 mg total) by mouth daily. 08/10/13   Patrecia Pour, NP  lisinopril-hydrochlorothiazide (PRINZIDE,ZESTORETIC) 20-25 MG per tablet Take 1 tablet by mouth daily. 08/10/13   Patrecia Pour, NP  ramelteon (ROZEREM) 8 MG tablet Take 1 tablet (8 mg total) by mouth at bedtime. 08/10/13   Patrecia Pour,  NP   Physical Exam: Filed Vitals:   08/05/15 1046 08/05/15 1245  BP: 222/93   Pulse: 79   Temp: 99.1 F (37.3 C) 99.1 F (37.3 C)  TempSrc: Oral   SpO2: 98%     EOMI NCAT pupils move equally  No icterus no pallor Smile symmetric she has a scar over the left lower general  Neck is thick however no thyromegaly noted S1-S2 no murmur rub or gallop Chest clinically clear no added sound  Abdomen is soft nontender  Power is 5/5 bilaterally reflexes 2/3 , Past-pointing is present on the left side more so than the right side . Sensory is intact Reflexes are 2/3 as above   Labs on Admission:  Basic Metabolic Panel:  Recent Labs Lab 08/05/15 1105 08/05/15 1114  NA 138 141  K 3.4* 3.5  CL 100* 98*  CO2 31  --   GLUCOSE 166* 165*  BUN 12 12  CREATININE 0.90 0.90  CALCIUM 9.1  --    Liver Function Tests:  Recent Labs Lab 08/05/15 1105  AST 16  ALT 15  ALKPHOS 113  BILITOT 0.7  PROT 8.2*  ALBUMIN 4.3   No results for input(s): LIPASE, AMYLASE in the last 168 hours. No results for input(s): AMMONIA in the last 168 hours. CBC:  Recent Labs Lab 08/05/15 1105 08/05/15 1114  WBC 9.1  --   NEUTROABS 4.7  --   HGB 14.1 15.0  HCT 42.7 44.0  MCV 85.2  --   PLT 295  --    Cardiac Enzymes: No results for input(s): CKTOTAL, CKMB, CKMBINDEX, TROPONINI in the last 168 hours.  BNP (last 3 results) No results for input(s): BNP in the last 8760 hours.  ProBNP (last 3 results) No results for input(s): PROBNP in the last 8760 hours.  CBG:  Recent Labs Lab 08/05/15 1059  GLUCAP 149*    Radiological Exams on Admission: Ct Head Wo Contrast  08/05/2015  CLINICAL DATA:  59 year old female with 5 day history of visual and proprioceptive problems. Weakness and headache. Predominantly right-sided headache with shooting pains to the left side. EXAM: CT HEAD WITHOUT CONTRAST TECHNIQUE: Contiguous axial images were obtained from the base of the skull through the vertex  without intravenous contrast. COMPARISON:  Head CT 12/14/2012. FINDINGS: In the right parietal region there is low attenuation involving the cortex, best demonstrated on images 16-18 of series 2, suspicious for evolving ischemia (likely subacute), new compared to prior study 12/14/2012. Patchy areas of mild decreased attenuation are noted throughout the deep and periventricular white matter of the cerebral hemispheres bilaterally, compatible with mild chronic microvascular ischemic disease. No other acute intracranial abnormalities. Specifically, no evidence of acute intracranial hemorrhage, no definite mass, mass effect, hydrocephalus or abnormal intra or extra-axial fluid collections. Multifocal mucosal thickening throughout the ethmoid and maxillary sinuses bilaterally.  Small air-fluid level in the right maxillary sinus. Mucous retention cyst or polyp in the anterior aspect of the left maxillary sinus measuring 1.4 cm in diameter incidentally noted. Mastoids are well pneumatized. No acute displaced skull fractures are identified. IMPRESSION: 1. Area of cortical low-attenuation the right parietal region, suspicious for evolving cortically based infarct. This could be confirmed with MRI of the brain with and without IV gadolinium. 2. Very mild chronic microvascular ischemic changes in the cerebral white matter, as above. 3. Paranasal sinus disease, as above, including a small air-fluid level in the right maxillary sinus, which could indicate acute sinusitis. Critical Value/emergent results were called by telephone at the time of interpretation on 08/05/2015 at 12:02 pm to Dr. Deno Etienne , who verbally acknowledged these results. Electronically Signed   By: Vinnie Langton M.D.   On: 08/05/2015 12:04   Mr Brain Wo Contrast  08/05/2015  CLINICAL DATA:  Stroke-like symptoms. Decreased cord nasion. History of hypertension, diabetes, and hyperlipidemia. EXAM: MRI HEAD WITHOUT CONTRAST TECHNIQUE: Multiplanar, multiecho  pulse sequences of the brain and surrounding structures were obtained without intravenous contrast. COMPARISON:  Head CT 08/05/2015 FINDINGS: The examination had to be discontinued prior to completion due to patient claustrophobia. Axial and coronal diffusion and sagittal T1 weighted images were obtained. There is a small region of acute cortical and subcortical infarction in the posterior right MCA territory involving the right parietal and occipital lobes corresponding to the low-density on CT earlier today. There is no evidence of gross hemorrhage, mass, or extra-axial fluid collection on this incomplete study. Ventricles and sulci are within normal limits in size for age. There is no midline shift. IMPRESSION: Incomplete examination. Small, acute right parieto-occipital infarct. Electronically Signed   By: Logan Bores M.D.   On: 08/05/2015 12:44    EKG: Independently reviewed. None performed   Assessment/Plan Principal Problem:   Stroke [facility]) Work-up for CVA Get Echo/Carotids A1c Lipid panel Would Rpt MRI brain with sedation and then transfer to [facility]   Active Problems:   Cocaine abuse-queiscent at present   Alcohol dependency (HCC)-Not drinking   Hypertension-Would start lower dose Diltiazem 120 mgXL. As is outside of the window, would lower BP gradually. Re-start Prinzide20-25in am    Diabetes mellitus-getA1c Start Novolog 5u tid. Continue Metforkin 100 bid asl ow risk hypoglycemia HLd Get Lipid panel-Crestor dosing likely will need to be increased to 40 mg Smoker-Cessation counseling needed prior tot d/c Depression-ResumeRozerem 8mg , Cymbalta30daily, hydroxyzine 25 q 6 prn anxiety if prn  Txto MCHonceMRI repeated Full code Tele  Time spent: [place], [facility] Pager [phone]  If 7PM-7AM, please contact night-coverage [email or web address] Password [email or web address] 08/05/2015, 12:58 PM

## 2015-08-05 NOTE — ED Provider Notes (Signed)
CSN: [id number]     Arrival date & time 08/05/15  1021 History   First MD Initiated Contact with Patient 08/05/15 1051     Chief Complaint  Patient presents with  . Stroke Symptoms  . Eye Problem  . Headache     (Consider location/radiation/quality/duration/timing/severity/associated sxs/prior Treatment) Patient is a 59 y.o. female presenting with eye problem and headaches. The history is provided by the patient.  Eye Problem Location:  L eye Quality:  Aching and sharp Severity:  Moderate Onset quality:  Gradual Duration:  5 days Timing:  Constant Progression:  Unchanged Chronicity:  New Relieved by:  Nothing Worsened by:  Nothing tried Ineffective treatments:  None tried Associated symptoms: blurred vision, double vision and headaches   Associated symptoms: no redness   Headache Associated symptoms: blurred vision and eye pain     59 yo F with a chief complaint of difficulty with her vision. This been going on for about 5 days. Patient saw the ophthalmologist today who is concerned for a left-sided hemianopsia as well as some afferent pupillary defect. Patient was then sent here for further eval. Patient having a left sided headache as well.   Past Medical History  Diagnosis Date  . Hypertension   . Diabetes mellitus   . Hyperlipidemia   . Asthma    Past Surgical History  Procedure Laterality Date  . Tonsillectomy    . Breast lumpectomy     Family History  Problem Relation Age of Onset  . Cancer Mother   . Diabetes Mother   . Hyperlipidemia Mother   . Hypertension Mother   . Rheum arthritis Father   . Asthma Father   . Stroke Father   . Thyroid disease Father   . Thyroid disease Brother    Social History  Substance Use Topics  . Smoking status: Current Some Day Smoker  . Smokeless tobacco: None  . Alcohol Use: 3.6 oz/week    6 Cans of beer per week   OB History    No data available     Review of Systems  Constitutional: Negative for chills.   HENT: Negative for rhinorrhea.   Eyes: Positive for blurred vision, double vision, pain and visual disturbance. Negative for redness.  Respiratory: Negative for shortness of breath and wheezing.   Cardiovascular: Negative for chest pain and palpitations.  Genitourinary: Negative for dysuria and urgency.  Musculoskeletal: Negative for arthralgias.  Skin: Negative for pallor and wound.  Neurological: Positive for headaches.      Allergies  Prednisone and Shrimp  Home Medications   Prior to Admission medications   Medication Sig Start Date End Date Taking? Authorizing Provider  albuterol (PROVENTIL HFA;VENTOLIN HFA) 108 (90 BASE) MCG/ACT inhaler Inhale 2 puffs into the lungs every 6 (six) hours as needed for wheezing or shortness of breath. Shortness of breath   Yes Historical Provider, MD  diltiazem (DILACOR XR) 240 MG 24 hr capsule Take 1 capsule (240 mg total) by mouth every morning. 08/10/13  Yes Patrecia Pour, NP  FLUoxetine (PROZAC) 20 MG tablet Take 20 mg by mouth daily.   Yes Historical Provider, MD  hydrOXYzine (ATARAX/VISTARIL) 25 MG tablet Take 1 tablet (25 mg total) by mouth every 6 (six) hours as needed for anxiety (or CIWA score </= 10). 08/10/13  Yes Patrecia Pour, NP  insulin aspart (NOVOLOG) 100 UNIT/ML injection Inject 5-10 Units into the skin 3 (three) times daily.  08/10/13  Yes Patrecia Pour, NP  metFORMIN (GLUCOPHAGE) 500  MG tablet Take 2 tablets (1,000 mg total) by mouth 2 (two) times daily with a meal. 08/10/13  Yes Patrecia Pour, NP  rosuvastatin (CRESTOR) 20 MG tablet Take 20 mg by mouth daily.   Yes Historical Provider, MD  DULoxetine (CYMBALTA) 30 MG capsule Take 1 capsule (30 mg total) by mouth daily. 08/10/13   Patrecia Pour, NP  lisinopril-hydrochlorothiazide (PRINZIDE,ZESTORETIC) 20-25 MG per tablet Take 1 tablet by mouth daily. 08/10/13   Patrecia Pour, NP  ramelteon (ROZEREM) 8 MG tablet Take 1 tablet (8 mg total) by mouth at bedtime. 08/10/13    Patrecia Pour, NP   BP 143/81 mmHg  Pulse 76  Temp(Src) 99.1 F (37.3 C) (Oral)  Resp 16  SpO2 96% Physical Exam  Constitutional: She is oriented to person, place, and time. She appears well-developed and well-nourished. No distress.  HENT:  Head: Normocephalic and atraumatic.  Eyes: EOM are normal. Pupils are equal, round, and reactive to light. Right eye exhibits no chemosis and no discharge. Left eye exhibits no chemosis and no discharge. Right conjunctiva is not injected. Left conjunctiva is not injected.  Left hemianopsia   Neck: Normal range of motion. Neck supple.  Cardiovascular: Normal rate and regular rhythm.  Exam reveals no gallop and no friction rub.   No murmur heard. Pulmonary/Chest: Effort normal. She has no wheezes. She has no rales.  Abdominal: Soft. She exhibits no distension. There is no tenderness.  Musculoskeletal: She exhibits no edema or tenderness.  Neurological: She is alert and oriented to person, place, and time. A cranial nerve deficit is present. No sensory deficit. Coordination and gait normal. GCS eye subscore is 4. GCS verbal subscore is 5. GCS motor subscore is 6. She displays no Babinski's sign on the right side. She displays no Babinski's sign on the left side.  Reflex Scores:      Tricep reflexes are 2+ on the right side and 2+ on the left side.      Bicep reflexes are 2+ on the right side and 2+ on the left side.      Brachioradialis reflexes are 2+ on the right side and 2+ on the left side.      Patellar reflexes are 2+ on the right side and 2+ on the left side.      Achilles reflexes are 2+ on the right side and 2+ on the left side. Left visual field deficit.  Difficult to have her touch things with outstretched hand worst to the left side of her body.  No noted difficulty with coordination  Skin: Skin is warm and dry. She is not diaphoretic.  Psychiatric: She has a normal mood and affect. Her behavior is normal.  Nursing note and vitals  reviewed.   ED Course  Procedures (including critical care time) Labs Review Labs Reviewed  BASIC METABOLIC PANEL - Abnormal; Notable for the following:    Potassium 3.4 (*)    Chloride 100 (*)    Glucose, Bld 166 (*)    All other components within normal limits  URINALYSIS, ROUTINE W REFLEX MICROSCOPIC (NOT AT [facility]) - Abnormal; Notable for the following:    Specific Gravity, Urine 1.002 (*)    All other components within normal limits  HEPATIC FUNCTION PANEL - Abnormal; Notable for the following:    Total Protein 8.2 (*)    Bilirubin, Direct <0.1 (*)    All other components within normal limits  CBG MONITORING, ED - Abnormal; Notable for the following:  Glucose-Capillary 149 (*)    All other components within normal limits  I-STAT CHEM 8, ED - Abnormal; Notable for the following:    Chloride 98 (*)    Glucose, Bld 165 (*)    All other components within normal limits  CBC  ETHANOL  PROTIME-INR  APTT  DIFFERENTIAL  URINE RAPID DRUG SCREEN, HOSP PERFORMED  I-STAT TROPOININ, ED    Imaging Review Ct Head Wo Contrast  08/05/2015  CLINICAL DATA:  59 year old female with 5 day history of visual and proprioceptive problems. Weakness and headache. Predominantly right-sided headache with shooting pains to the left side. EXAM: CT HEAD WITHOUT CONTRAST TECHNIQUE: Contiguous axial images were obtained from the base of the skull through the vertex without intravenous contrast. COMPARISON:  Head CT 12/14/2012. FINDINGS: In the right parietal region there is low attenuation involving the cortex, best demonstrated on images 16-18 of series 2, suspicious for evolving ischemia (likely subacute), new compared to prior study 12/14/2012. Patchy areas of mild decreased attenuation are noted throughout the deep and periventricular white matter of the cerebral hemispheres bilaterally, compatible with mild chronic microvascular ischemic disease. No other acute intracranial abnormalities. Specifically,  no evidence of acute intracranial hemorrhage, no definite mass, mass effect, hydrocephalus or abnormal intra or extra-axial fluid collections. Multifocal mucosal thickening throughout the ethmoid and maxillary sinuses bilaterally. Small air-fluid level in the right maxillary sinus. Mucous retention cyst or polyp in the anterior aspect of the left maxillary sinus measuring 1.4 cm in diameter incidentally noted. Mastoids are well pneumatized. No acute displaced skull fractures are identified. IMPRESSION: 1. Area of cortical low-attenuation the right parietal region, suspicious for evolving cortically based infarct. This could be confirmed with MRI of the brain with and without IV gadolinium. 2. Very mild chronic microvascular ischemic changes in the cerebral white matter, as above. 3. Paranasal sinus disease, as above, including a small air-fluid level in the right maxillary sinus, which could indicate acute sinusitis. Critical Value/emergent results were called by telephone at the time of interpretation on 08/05/2015 at 12:02 pm to Dr. Deno Etienne , who verbally acknowledged these results. Electronically Signed   By: Vinnie Langton M.D.   On: 08/05/2015 12:04   Mr [name] Head Wo Contrast  08/05/2015  CLINICAL DATA:  Acute right parieto-occipital infarct on MRI earlier today. Visual field changes. EXAM: MRA HEAD WITHOUT CONTRAST TECHNIQUE: Angiographic images of the Circle of Willis were obtained using MRA technique without intravenous contrast. COMPARISON:  None. FINDINGS: Study is mildly motion degraded. The visualized distal vertebral arteries are patent with the left being mildly dominant. There is moderate distal right vertebral artery stenosis at the vertebrobasilar junction. Right PICA origin, bilateral AICA origins, and bilateral SCA origins are patent. Basilar artery is patent without stenosis. The there is a small left posterior communicating artery. PCAs are patent with mild branch vessel irregularity but no  significant proximal stenosis. Internal carotid arteries are patent from skullbase to carotid termini. There is mild proximal left ICA cavernous segment stenosis. 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 The right A1 segment is dominant. No significant ACA stenosis is seen. There is mild narrowing of the proximal right M1 segment. The right MCA bifurcation is patent, however there is segmental occlusion or near complete occlusion over approximately a 5 mm length of the proximal right M2 inferior division trunk with flow identified distal to this. Left MCA is patent with mild branch vessel irregularity but no significant proximal stenosis. IMPRESSION: 1. Segmental  occlusion or near complete occlusion of the proximal right M2 inferior division. 2. Mild right M1 stenosis. 3. Moderate distal right vertebral artery stenosis. 4. 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 Electronically Signed   By: Logan Bores M.D.   On: 08/05/2015 15:31   Mr Brain Wo Contrast  08/05/2015  CLINICAL DATA:  Stroke-like symptoms. Decreased cord nasion. History of hypertension, diabetes, and hyperlipidemia. EXAM: MRI HEAD WITHOUT CONTRAST TECHNIQUE: Multiplanar, multiecho pulse sequences of the brain and surrounding structures were obtained without intravenous contrast. COMPARISON:  Head CT 08/05/2015 FINDINGS: The examination had to be discontinued prior to completion due to patient claustrophobia. Axial and coronal diffusion and sagittal T1 weighted images were obtained. There is a small region of acute cortical and subcortical infarction in the posterior right MCA territory involving the right parietal and occipital lobes corresponding to the low-density on CT earlier today. There is no evidence of gross hemorrhage, mass, or extra-axial fluid collection on this incomplete study. Ventricles and sulci are within normal limits in size for age. There is no midline shift. IMPRESSION:  Incomplete examination. Small, acute right parieto-occipital infarct. Electronically Signed   By: Logan Bores M.D.   On: 08/05/2015 12:44   I have personally reviewed and evaluated these images and lab results as part of my medical decision-making.   EKG Interpretation None      MDM   Final diagnoses:  Stroke (cerebrum) ([place])    59 yo F with a chief complaint of strokelike symptoms. On exam patient with left afferent pupillary defect as well as left-sided hemianopsia. CT scan concerning for acute cortical infarct. Discussed the case with Dr. Doy Mince, neurology recommended transfer to [facility] for evaluation by vascular/ir for possible intervention.  The patients results and plan were reviewed and discussed.   Any x-rays performed were independently reviewed by myself.   CRITICAL CARE Performed by: Cecilio Asper   Total critical care time: 30 minutes  Critical care time was exclusive of separately billable procedures and treating other patients.  Critical care was necessary to treat or prevent imminent or life-threatening deterioration.  Critical care was time spent personally by me on the following activities: development of treatment plan with patient and/or surrogate as well as nursing, discussions with consultants, evaluation of patient's response to treatment, examination of patient, obtaining history from patient or surrogate, ordering and performing treatments and interventions, ordering and review of laboratory studies, ordering and review of radiographic studies, pulse oximetry and re-evaluation of patient's condition.   Differential diagnosis were considered with the presenting HPI.  Medications  prochlorperazine (COMPAZINE) injection 10 mg (10 mg Intravenous Given 08/05/15 1147)  diphenhydrAMINE (BENADRYL) injection 25 mg (25 mg Intravenous Given 08/05/15 1147)  sodium chloride 0.9 % bolus 1,000 mL (0 mLs Intravenous Stopped 08/05/15 1301)  LORazepam (ATIVAN)  injection 1 mg (1 mg Intravenous Given 08/05/15 1400)  labetalol (NORMODYNE,TRANDATE) injection 5 mg (5 mg Intravenous Given 08/05/15 1452)    Filed Vitals:   08/05/15 1330 08/05/15 1400 08/05/15 1450 08/05/15 1500  BP: 250/107 221/95 170/97 143/81  Pulse: 74 73 76 76  Temp:      TempSrc:      Resp: 16 17 16 16   SpO2: 99% 99% 96% 96%    Final diagnoses:  Stroke (cerebrum) ([place])    Admission/ observation were discussed with the admitting physician, patient and/or family and they are comfortable with the plan.      Deno Etienne, DO 08/05/15 1534

## 2015-08-05 NOTE — ED Notes (Signed)
5 days ago began having visual proprioception problems, weakness, and headache. Went to see eye doctor who sent her with with a note that says "Pt has left hemifield defect along with symptoms of visual disturbance. Blood pressure is very elevated as well." Complaining of right sided headache shooting to left side.

## 2015-08-05 NOTE — ED Notes (Signed)
Report called to Remo Lipps, RN-[facility] to transport

## 2015-08-06 ENCOUNTER — Encounter (HOSPITAL_COMMUNITY): Payer: Self-pay | Admitting: Radiology

## 2015-08-06 ENCOUNTER — Inpatient Hospital Stay (HOSPITAL_COMMUNITY): Payer: PRIVATE HEALTH INSURANCE

## 2015-08-06 DIAGNOSIS — E1159 Type 2 diabetes mellitus with other circulatory complications: Secondary | ICD-10-CM

## 2015-08-06 DIAGNOSIS — E785 Hyperlipidemia, unspecified: Secondary | ICD-10-CM

## 2015-08-06 DIAGNOSIS — I10 Essential (primary) hypertension: Secondary | ICD-10-CM

## 2015-08-06 DIAGNOSIS — F1010 Alcohol abuse, uncomplicated: Secondary | ICD-10-CM

## 2015-08-06 DIAGNOSIS — R002 Palpitations: Secondary | ICD-10-CM

## 2015-08-06 DIAGNOSIS — E1310 Other specified diabetes mellitus with ketoacidosis without coma: Secondary | ICD-10-CM

## 2015-08-06 DIAGNOSIS — I6789 Other cerebrovascular disease: Secondary | ICD-10-CM

## 2015-08-06 DIAGNOSIS — I63411 Cerebral infarction due to embolism of right middle cerebral artery: Secondary | ICD-10-CM

## 2015-08-06 LAB — LIPID PANEL
Cholesterol: 190 mg/dL (ref 0–200)
HDL: 34 mg/dL — AB (ref >40)
LDL CALC: 138 mg/dL — AB (ref 0–99)
TRIGLYCERIDES: 91 mg/dL (ref <150)
Total CHOL/HDL Ratio: 5.6 RATIO
VLDL: 18 mg/dL (ref 0–40)

## 2015-08-06 LAB — GLUCOSE, CAPILLARY
GLUCOSE-CAPILLARY: 199 mg/dL — AB (ref 65–99)
GLUCOSE-CAPILLARY: 325 mg/dL — AB (ref 65–99)
GLUCOSE-CAPILLARY: 158 mg/dL — AB (ref 65–99)
GLUCOSE-CAPILLARY: 204 mg/dL — AB (ref 65–99)

## 2015-08-06 MED ORDER — INSULIN ASPART 100 UNIT/ML ~~LOC~~ SOLN
0.0000 [IU] | Freq: Every day | SUBCUTANEOUS | Status: DC
  Administered 2015-08-06: 2 [IU] via SUBCUTANEOUS

## 2015-08-06 MED ORDER — IOHEXOL 350 MG/ML SOLN
80.0000 mL | Freq: Once | INTRAVENOUS | Status: AC | PRN
  Administered 2015-08-06: 100 mL via INTRAVENOUS

## 2015-08-06 MED ORDER — INSULIN ASPART 100 UNIT/ML ~~LOC~~ SOLN
0.0000 [IU] | Freq: Three times a day (TID) | SUBCUTANEOUS | Status: DC
  Administered 2015-08-06: 7 [IU] via SUBCUTANEOUS
  Administered 2015-08-06 (×2): 2 [IU] via SUBCUTANEOUS

## 2015-08-06 MED ORDER — DILTIAZEM HCL ER 240 MG PO CP24
240.0000 mg | ORAL_CAPSULE | Freq: Every morning | ORAL | Status: DC
  Administered 2015-08-06 – 2015-08-07 (×2): 240 mg via ORAL
  Filled 2015-08-06 (×4): qty 1

## 2015-08-06 MED ORDER — METOPROLOL TARTRATE 25 MG PO TABS: 25.0000 mg | ORAL_TABLET | Freq: Two times a day (BID) | ORAL | Status: DC

## 2015-08-06 NOTE — Progress Notes (Signed)
[facility]                                                                              Patient Demographics  Lisa Anderson, is a 59 y.o. female, DOB - March 26, 1956, [id number]  Admit date - 08/05/2015   Admitting Physician Nita Sells, MD  Outpatient Primary MD for the patient is No primary care provider on file.  LOS - 1   Chief Complaint  Patient presents with  . Stroke Symptoms  . Eye Problem  . Headache       Brief HPI   Patient is a 59 year old female with hypertension, hyperlipidemia, asthma, diabetes mellitus, noncompliant presented initially to [facility] ED on 11/19 with 5 day history of diplopia, moderate to severe right-sided headache and gait instability. Patient went to ophthalmologist and her vision was apparently found to be normal. However because of the persistent visual changes, patient was admitted by the ophthalmologist to come to ED for stroke workup. CT scan was suggestive of stroke and MRI also confirmed a small right acute parieto-occipital CVA. Patient had run out of her BP medications in months, BP was 250/107 at the time of admission. Patient was transferred [facility] for further stroke workup.   Assessment & Plan    Principal Problem:  Acute CVA Stroke [facility]): With underlying history of hypertension, hyperlipidemia, diabetes, severe noncompliance - MRI of the brain confirmed small right acute parieto-occipital CVA - MRA showed segmental occlusion or near complete occlusion of the proximal right M2 inferior division. 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  - Patient placed on aspirin daily, neurology consulted - Follow HbA1c , LDL of 138, continue Crestor - Per neurology, CT neck showed no sig carotid or vertebral artery stenosis in the neck - TEE on 11/11 per neuro - PT is recommending no PT follow-up   Active Problems:   Cocaine abuse: Prior history of cocaine abuse UDS negative for cocaine this  admission    Alcohol abuse - Alcohol level less than 5, patient reports not drinking    Hypertension - Patient hasn't been taking her antihypertensives for months, has been taking some medications from her friend    Diabetes mellitus - Follow hemoglobin A1c, for now continue sliding scale insulin  Depression Continue Cymbalta  Code Status: Full CODE STATUS   Family Communication: Discussed in detail with the patient, all imaging results, lab results explained to the patient    Disposition Plan:  Possibly tomorrow if workup is complete  Time Spent in minutes 25 minutes  Procedures  MRI, MRA brain, CT angiogram of neck  Consults   neuro  DVT Prophylaxis Lovenox   Medications  Scheduled Meds: .  stroke: mapping our early stages of recovery book   Does not apply Once  . aspirin  300 mg Rectal Daily   Or  . aspirin  325 mg Oral Daily  . diltiazem  240 mg Oral q morning - 10a  . DULoxetine  30 mg Oral Daily  . enoxaparin (LOVENOX) injection  40 mg Subcutaneous Q24H  . FLUoxetine  20 mg Oral Daily  . insulin aspart  0-5 Units  Subcutaneous QHS  . insulin aspart  0-9 Units Subcutaneous TID WC  . ramelteon  8 mg Oral QHS  . rosuvastatin  20 mg Oral q1800   Continuous Infusions:  PRN Meds:.albuterol, hydrOXYzine, senna-docusate   Antibiotics   Anti-infectives    None        Subjective:   Lisa Anderson was seen and examined today.  Feeling a lot better today, vision improving. Patient denies dizziness, chest pain, shortness of breath, abdominal pain, N/V/D/C, new weakness, numbess, tingling. No acute events overnight.    Objective:   Blood pressure 167/94, pulse 84, temperature 98.3 F (36.8 C), temperature source Oral, resp. rate 18, SpO2 100 %.  Wt Readings from Last 3 Encounters:  08/07/13 81.194 kg (179 lb)  02/09/13 74.844 kg (165 lb)  12/15/12 74.844 kg (165 lb)    No intake or output data in the 24 hours ending 08/06/15 1305  Exam  General:  Alert and oriented x 3, NAD  HEENT:  PERRLA, EOMI, Anicteric Sclera, mucous membranes moist.   Neck: Supple, no JVD, no masses  CVS: S1 S2 auscultated, no rubs, murmurs or gallops. Regular rate and rhythm.  Respiratory: Clear to auscultation bilaterally, no wheezing, rales or rhonchi  Abdomen: Soft, nontender, nondistended, + bowel sounds  Ext: no cyanosis clubbing or edema  Neuro: AAOx3, Cr N's II- XII. Strength 5/5 upper and lower extremities bilaterally  Skin: No rashes  Psych: Normal affect and demeanor, alert and oriented x3    Data Review   Micro Results No results found for this or any previous visit (from the past 240 hour(s)).  Radiology Reports Ct Head Wo Contrast  08/05/2015  CLINICAL DATA:  59 year old female with 5 day history of visual and proprioceptive problems. Weakness and headache. Predominantly right-sided headache with shooting pains to the left side. EXAM: CT HEAD WITHOUT CONTRAST TECHNIQUE: Contiguous axial images were obtained from the base of the skull through the vertex without intravenous contrast. COMPARISON:  Head CT 12/14/2012. FINDINGS: In the right parietal region there is low attenuation involving the cortex, best demonstrated on images 16-18 of series 2, suspicious for evolving ischemia (likely subacute), new compared to prior study 12/14/2012. Patchy areas of mild decreased attenuation are noted throughout the deep and periventricular white matter of the cerebral hemispheres bilaterally, compatible with mild chronic microvascular ischemic disease. No other acute intracranial abnormalities. Specifically, no evidence of acute intracranial hemorrhage, no definite mass, mass effect, hydrocephalus or abnormal intra or extra-axial fluid collections. Multifocal mucosal thickening throughout the ethmoid and maxillary sinuses bilaterally. Small air-fluid level in the right maxillary sinus. Mucous retention cyst or polyp in the anterior aspect of the left  maxillary sinus measuring 1.4 cm in diameter incidentally noted. Mastoids are well pneumatized. No acute displaced skull fractures are identified. IMPRESSION: 1. Area of cortical low-attenuation the right parietal region, suspicious for evolving cortically based infarct. This could be confirmed with MRI of the brain with and without IV gadolinium. 2. Very mild chronic microvascular ischemic changes in the cerebral white matter, as above. 3. Paranasal sinus disease, as above, including a small air-fluid level in the right maxillary sinus, which could indicate acute sinusitis. Critical Value/emergent results were called by telephone at the time of interpretation on 08/05/2015 at 12:02 pm to Dr. Deno Etienne , who verbally acknowledged these results. Electronically Signed   By: Vinnie Langton M.D.   On: 08/05/2015 12:04   Ct Angio Neck W/cm &/or Wo/cm  08/06/2015  CLINICAL DATA:  Right parietal stroke.  EXAM: CT ANGIOGRAPHY NECK TECHNIQUE: Multidetector CT imaging of the neck was performed using the standard protocol during bolus administration of intravenous contrast. Multiplanar CT image reconstructions and MIPs were obtained to evaluate the vascular anatomy. Carotid stenosis measurements (when applicable) are obtained utilizing NASCET criteria, using the distal internal carotid diameter as the denominator. CONTRAST:  160mL OMNIPAQUE IOHEXOL 350 MG/ML SOLN COMPARISON:  MRI and MRA head 08/05/2015 FINDINGS: Aortic arch: Normal aortic arch without stenosis or dissection. Proximal great vessels widely patent. Lung apices clear. Right carotid system: Right common carotid artery is normal. Very mild atherosclerotic plaque in the carotid bulb without significant stenosis. No dissection Left carotid system: Left common carotid artery widely patent. Minimal atherosclerotic plaque in the carotid bulb without stenosis or dissection Vertebral arteries:Left vertebral artery dominant. Both vertebral arteries patent to the  basilar without stenosis or dissection. Skeleton: Negative Other neck: Negative for mass or adenopathy in the neck. IMPRESSION: No significant carotid or vertebral artery stenosis in the neck. Electronically Signed   By: Franchot Gallo M.D.   On: 08/06/2015 11:42   Mr [name] Head Wo Contrast  08/05/2015  CLINICAL DATA:  Acute right parieto-occipital infarct on MRI earlier today. Visual field changes. EXAM: MRA HEAD WITHOUT CONTRAST TECHNIQUE: Angiographic images of the Circle of Willis were obtained using MRA technique without intravenous contrast. COMPARISON:  None. FINDINGS: Study is mildly motion degraded. The visualized distal vertebral arteries are patent with the left being mildly dominant. There is moderate distal right vertebral artery stenosis at the vertebrobasilar junction. Right PICA origin, bilateral AICA origins, and bilateral SCA origins are patent. Basilar artery is patent without stenosis. The there is a small left posterior communicating artery. PCAs are patent with mild branch vessel irregularity but no significant proximal stenosis. Internal carotid arteries are patent from skullbase to carotid termini. There is mild proximal left ICA cavernous segment stenosis. 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 The right A1 segment is dominant. No significant ACA stenosis is seen. There is mild narrowing of the proximal right M1 segment. The right MCA bifurcation is patent, however there is segmental occlusion or near complete occlusion over approximately a 5 mm length of the proximal right M2 inferior division trunk with flow identified distal to this. Left MCA is patent with mild branch vessel irregularity but no significant proximal stenosis. IMPRESSION: 1. Segmental occlusion or near complete occlusion of the proximal right M2 inferior division. 2. Mild right M1 stenosis. 3. Moderate distal right vertebral artery stenosis. 4. 2 mm right posterior communicating artery  region aneurysm. Electronically Signed   By: Logan Bores M.D.   On: 08/05/2015 15:31   Mr Brain Wo Contrast  08/05/2015  ADDENDUM REPORT: 08/05/2015 15:35 ADDENDUM: The second sentence of the clinical data section should read "Decreased coordination." The patient returned later in the day to complete the remaining noncontrast brain MRI sequences. Additional findings as follows: There is mild right parieto-occipital cortical cytotoxic edema related to the acute infarct. No intracranial hemorrhage, mass, midline shift, or extra-axial fluid collection is seen. Small foci of T2 hyperintensity in the subcortical and deep cerebral white matter bilaterally and in the pons are nonspecific but compatible with mild chronic small vessel ischemic disease. Orbits are unremarkable. There is mild right maxillary sinus mucosal thickening with small volume fluid/secretions. Polypoid mucosal thickening is noted in the left sphenoid and left maxillary sinuses, and there is mild bilateral ethmoid air cell mucosal thickening. Trace bilateral mastoid effusions are noted. Major  intracranial vascular flow voids are preserved. Electronically Signed   By: Logan Bores M.D.   On: 08/05/2015 15:35  08/05/2015  CLINICAL DATA:  Stroke-like symptoms. Decreased cord nasion. History of hypertension, diabetes, and hyperlipidemia. EXAM: MRI HEAD WITHOUT CONTRAST TECHNIQUE: Multiplanar, multiecho pulse sequences of the brain and surrounding structures were obtained without intravenous contrast. COMPARISON:  Head CT 08/05/2015 FINDINGS: The examination had to be discontinued prior to completion due to patient claustrophobia. Axial and coronal diffusion and sagittal T1 weighted images were obtained. There is a small region of acute cortical and subcortical infarction in the posterior right MCA territory involving the right parietal and occipital lobes corresponding to the low-density on CT earlier today. There is no evidence of gross hemorrhage,  mass, or extra-axial fluid collection on this incomplete study. Ventricles and sulci are within normal limits in size for age. There is no midline shift. IMPRESSION: Incomplete examination. Small, acute right parieto-occipital infarct. Electronically Signed: By: Logan Bores M.D. On: 08/05/2015 12:44    CBC  Recent Labs Lab 08/05/15 1105 08/05/15 1114 08/05/15 1957  WBC 9.1  --  8.4  HGB 14.1 15.0 12.7  HCT 42.7 44.0 39.2  PLT 295  --  284  MCV 85.2  --  85.2  MCH 28.1  --  27.6  MCHC 33.0  --  32.4  RDW 12.5  --  12.5  LYMPHSABS 3.2  --   --   MONOABS 0.7  --   --   EOSABS 0.5  --   --   BASOSABS 0.1  --   --     Chemistries   Recent Labs Lab 08/05/15 1105 08/05/15 1114 08/05/15 1957  NA 138 141  --   K 3.4* 3.5  --   CL 100* 98*  --   CO2 31  --   --   GLUCOSE 166* 165*  --   BUN 12 12  --   CREATININE 0.90 0.90 0.81  CALCIUM 9.1  --   --   AST 16  --   --   ALT 15  --   --   ALKPHOS 113  --   --   BILITOT 0.7  --   --    ------------------------------------------------------------------------------------------------------------------ CrCl cannot be calculated (Unknown ideal weight.). ------------------------------------------------------------------------------------------------------------------ No results for input(s): HGBA1C in the last 72 hours. ------------------------------------------------------------------------------------------------------------------  Recent Labs  08/06/15 0607  CHOL 190  HDL 34*  LDLCALC 138*  TRIG 91  CHOLHDL 5.6   ------------------------------------------------------------------------------------------------------------------ No results for input(s): TSH, T4TOTAL, T3FREE, THYROIDAB in the last 72 hours.  Invalid input(s): FREET3 ------------------------------------------------------------------------------------------------------------------ No results for input(s): VITAMINB12, FOLATE, FERRITIN, TIBC, IRON, RETICCTPCT  in the last 72 hours.  Coagulation profile  Recent Labs Lab 08/05/15 1105  INR 1.00    No results for input(s): DDIMER in the last 72 hours.  Cardiac Enzymes No results for input(s): CKMB, TROPONINI, MYOGLOBIN in the last 168 hours.  Invalid input(s): CK ------------------------------------------------------------------------------------------------------------------ Invalid input(s): [place]  08/05/15 1059 08/05/15 2214 08/06/15 0638 08/06/15 1139  GLUCAP 149* 156* 199* 325*     Trevionne Advani M.D. [facility] 08/06/2015, 1:05 PM  Pager: [phone] Between 7am to 7pm - call Pager - [phone]  After 7pm go to [email or web address] - password [email or web address]  Call night coverage person covering after 7pm

## 2015-08-06 NOTE — Evaluation (Signed)
Occupational Therapy Evaluation and Discharge Summary Patient Details Name: Lisa Anderson MRN: [id number] DOB: [birth year] Today's Date: 08/06/2015    History of Present Illness Patient is a 59 y/o female who presents with visual changes, gait disturbance and HA. Head CT- shows cortical low attenuation in the right parietal region. MRI-small right acute parieto-occipital stroke. PMH of HTN (pt has not been taking meds since she ran out), HLD, DM.   Clinical Impression   Pt admitted with the above diagnosis and overall is doing well with basic adls.  Pt has some visual "changes" that seem to come and go.  Pt with normal vision at start of eval and at end stated she was getting floaters in her vision.  At this time pt began undershooting when reaching for objects with the L hand and did not see as clearly to the Left.  This lasted appx 3 minutes and resolved.  Nursing aware.  Feel if symptoms persist she may benefit from and OPOT low vision evaluation and continued driving will need to be discussed with MD.  Pt did retain vision in both visual fields even when she was experiencing the visual changes. No further acute OT needs.    Follow Up Recommendations  Outpatient OT;Other (comment) (if visual changes persist)    Equipment Recommendations  None recommended by OT    Recommendations for Other Services       Precautions / Restrictions Precautions Precautions: None Restrictions Weight Bearing Restrictions: No      Mobility Bed Mobility Overal bed mobility: Modified Independent                Transfers Overall transfer level: Modified independent Equipment used: None Transfers: Sit to/from Stand Sit to Stand: Modified independent (Device/Increase time)         General transfer comment: Slow to stand but steady.    Balance Overall balance assessment: No apparent balance deficits (not formally assessed) Sitting-balance support: Feet supported;No upper extremity  supported Sitting balance-Leahy Scale: Normal     Standing balance support: During functional activity Standing balance-Leahy Scale: Good               High level balance activites: Direction changes;Turns;Sudden stops;Head turns High Level Balance Comments: Pt picked objects off floor with no assist. Standardized Balance Assessment Standardized Balance Assessment : Dynamic Gait Index   Dynamic Gait Index Level Surface: Normal Change in Gait Speed: Normal Gait with Horizontal Head Turns: Mild Impairment Gait with Vertical Head Turns: Normal Gait and Pivot Turn: Normal Step Over Obstacle: Normal Step Around Obstacles: Normal Steps: Mild Impairment Total Score: 22      ADL Overall ADL's : At baseline                                       General ADL Comments: Pt is able to complete adls w/o assist in acute care setting.  Pts visual changes come and go.  Have concerns with pt returning to driving if this continues.     Vision Vision Assessment?: Yes Eye Alignment: Within Functional Limits Ocular Range of Motion: Within Functional Limits Alignment/Gaze Preference: Within Defined Limits Tracking/Visual Pursuits: Able to track stimulus in all quads without difficulty Saccades: Within functional limits Convergence: Impaired - to be further tested in functional context Visual Fields: No apparent deficits Depth Perception: Undershoots Additional Comments: At one point in eval none of these symptoms presented.  15 min  later, they presented themselves.   Perception Perception Perception Tested?: Yes Spatial deficits: Pt more with depth perception issues reaching for door knobs and for glasses and undershooting.   Praxis      Pertinent Vitals/Pain Pain Assessment: No/denies pain     Hand Dominance Right   Extremity/Trunk Assessment Upper Extremity Assessment Upper Extremity Assessment: Overall WFL for tasks assessed   Lower Extremity  Assessment Lower Extremity Assessment: Defer to PT evaluation   Cervical / Trunk Assessment Cervical / Trunk Assessment: Normal   Communication Communication Communication: No difficulties   Cognition Arousal/Alertness: Awake/alert Behavior During Therapy: WFL for tasks assessed/performed Overall Cognitive Status: Impaired/Different from baseline Area of Impairment: Memory     Memory: Decreased short-term memory         General Comments: Pt has mild STM deficits.  Pt recalled 2/3 items after 30- second delay. Pt reports having memory deficits recently while at work.  Cant recall time of day etc.   General Comments       Exercises       Shoulder Instructions      Home Living Family/patient expects to be discharged to:: Private residence Living Arrangements: Children;Other relatives Available Help at Discharge: Family;Available PRN/intermittently Type of Home: House Home Access: Level entry     Home Layout: Two level;Bed/bath upstairs Alternate Level Stairs-Number of Steps: 1 flight Alternate Level Stairs-Rails: Right Bathroom Shower/Tub: Occupational psychologist: Standard     Home Equipment: None          Prior Functioning/Environment Level of Independence: Independent        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    OT Diagnosis: Disturbance of vision   OT Problem List: Impaired vision/perception   OT Treatment/Interventions:      OT Goals(Current goals can be found in the care plan section) Acute Rehab OT Goals Patient Stated Goal: to get back to my patients and work OT Goal Formulation: All assessment and education complete, DC therapy  OT Frequency:     Barriers to D/C:            Co-evaluation              End of Session Nurse Communication: Mobility status  Activity Tolerance: Patient tolerated treatment well Patient left: in bed;with call bell/phone within reach   Time: 1245-1320 OT Time Calculation (min): 35  min Charges:  OT General Charges $OT Visit: 1 Procedure OT Evaluation $Initial OT Evaluation Tier I: 1 Procedure OT Treatments $Self Care/Home Management : 8-22 mins G-Codes:    Glenford Peers August 09, 2015, 1:32 PM  [phone]

## 2015-08-06 NOTE — Progress Notes (Signed)
STROKE TEAM PROGRESS NOTE   HISTORY Lisa Anderson is an 59 y.o. female who reports that she has a history of HTN but has not been taking her medications recently and her BP has been high. Reports that some time over the weekend while working began to experience a headache on the right side of her head. She then began to have changes in her vision. Noted that when she reached for things they were not there but were somewhat to the side of where she thought it was. Felt her vision was blurry as well. Saw an ophthalmologist on yesterday who recommended that she come to the ED. She was able to find a ride today and presented. Unable to determine her last known well. Patient was not administered TPA secondary to delay in arrival. She was admitted for further evaluation and treatment.   SUBJECTIVE (INTERVAL HISTORY) No family are is at the bedside.  Overall she feels her condition is gradually improving. She stated that she still has intermittent wavy lines, flashes in the eye fields binocular, lasting 5 min or so. It may happen every 2 hours. Denies any seizure like activity. Complains of right sided headache but denies migraine history. Admitted that she has palpitations for a while, sudden onset and sudden stop, especially at night and irregular heart beat. Has seen physician for this, was given metoprolol and cardizem. It happens every day.    OBJECTIVE Temp:  [98.1 F (36.7 C)-99.1 F (37.3 C)] 98.3 F (36.8 C) (11/10 1000) Pulse Rate:  [73-87] 84 (11/10 1000) Cardiac Rhythm:  [-] Normal sinus rhythm (11/10 0759) Resp:  [15-31] 18 (11/10 1000) BP: (141-250)/(66-107) 167/94 mmHg (11/10 1000) SpO2:  [95 %-100 %] 100 % (11/10 1000)  CBC:  Recent Labs Lab 08/05/15 1105 08/05/15 1114 08/05/15 1957  WBC 9.1  --  8.4  NEUTROABS 4.7  --   --   HGB 14.1 15.0 12.7  HCT 42.7 44.0 39.2  MCV 85.2  --  85.2  PLT 295  --  [id number]    Basic Metabolic Panel:  Recent Labs Lab 08/05/15 1105  08/05/15 1114 08/05/15 1957  NA 138 141  --   K 3.4* 3.5  --   CL 100* 98*  --   CO2 31  --   --   GLUCOSE 166* 165*  --   BUN 12 12  --   CREATININE 0.90 0.90 0.81  CALCIUM 9.1  --   --     Lipid Panel:    Component Value Date/Time   CHOL 190 08/06/2015 0607   TRIG 91 08/06/2015 0607   HDL 34* 08/06/2015 0607   CHOLHDL 5.6 08/06/2015 0607   VLDL 18 08/06/2015 0607   LDLCALC 138* 08/06/2015 0607   HgbA1c:  Lab Results  Component Value Date   HGBA1C 8.1* 08/08/2013   Urine Drug Screen:    Component Value Date/Time   LABOPIA NONE DETECTED 08/05/2015 1303   COCAINSCRNUR NONE DETECTED 08/05/2015 1303   LABBENZ NONE DETECTED 08/05/2015 1303   AMPHETMU NONE DETECTED 08/05/2015 1303   THCU NONE DETECTED 08/05/2015 1303   LABBARB NONE DETECTED 08/05/2015 1303      IMAGING I have personally reviewed the radiological images below and agree with the radiology interpretations.  Ct Head Wo Contrast 08/05/2015  1. Area of cortical low-attenuation the right parietal region, suspicious for evolving cortically based infarct. This could be confirmed with MRI of the brain with and without IV gadolinium. 2. Very mild chronic microvascular ischemic  changes in the cerebral white matter, as above. 3. Paranasal sinus disease, as above, including a small air-fluid level in the right maxillary sinus, which could indicate acute sinusitis.   Ct Angio Neck W/cm &/or Wo/cm 08/06/2015   No significant carotid or vertebral artery stenosis in the neck.   Mr Lisa Anderson Head Wo Contrast 08/05/2015  1. Segmental occlusion or near complete occlusion of the proximal right M2 inferior division. 2. Mild right M1 stenosis. 3. Moderate distal right vertebral artery stenosis. 4. 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   Mr Brain Wo Contrast 08/05/2015  There is mild right parieto-occipital cortical cytotoxic edema related to the acute infarct. No intracranial hemorrhage, mass, midline shift, or  extra-axial fluid collection is seen. Small foci of T2 hyperintensity in the subcortical and deep cerebral white matter bilaterally and in the pons are nonspecific but compatible with mild chronic small vessel ischemic disease. Orbits are unremarkable. There is mild right maxillary sinus mucosal thickening with small volume fluid/secretions. Polypoid mucosal thickening is noted in the left sphenoid and left maxillary sinuses, and there is mild bilateral ethmoid air cell mucosal thickening. Trace bilateral mastoid effusions are noted. Major intracranial vascular flow voids are preserved.   2D Echocardiogram  - Left ventricle: The cavity size was normal. Wall thickness wasnormal. Systolic function was normal. The estimated ejectionfraction was in the range of 55% to 60%. Wall motion was normal;there were no regional wall motion abnormalities. Dopplerparameters are consistent with abnormal left ventricularrelaxation (grade 1 diastolic dysfunction). - Left atrium: The atrium was mildly dilated. - Atrial septum: No defect or patent foramen ovale was identified.  EEG - pending   PHYSICAL EXAM Physical exam  Temp:  [98 F (36.7 C)-98.7 F (37.1 C)] 98.3 F (36.8 C) (11/10 2152) Pulse Rate:  [71-85] 71 (11/10 2152) Resp:  [16-18] 18 (11/10 2152) BP: (142-167)/(62-94) 163/82 mmHg (11/10 2152) SpO2:  [97 %-100 %] 99 % (11/10 2152)  General - Well nourished, well developed, in no apparent distress.  Ophthalmologic - Sharp disc margins OU.  Cardiovascular - Regular rate and rhythm with no murmur.  Mental Status -  Level of arousal and orientation to time, place, and person were intact. Language including expression, naming, repetition, comprehension was assessed and found intact. Fund of Knowledge was assessed and was intact.  Cranial Nerves II - XII - II - Visual field intact OU. III, IV, VI - Extraocular movements intact. V - Facial sensation intact bilaterally. VII - Facial movement  intact bilaterally. VIII - Hearing & vestibular intact bilaterally. X - Palate elevates symmetrically. XI - Chin turning & shoulder shrug intact bilaterally. XII - Tongue protrusion intact.  Motor Strength - The patient's strength was normal in all extremities and pronator drift was absent.  Bulk was normal and fasciculations were absent.   Motor Tone - Muscle tone was assessed at the neck and appendages and was normal.  Reflexes - The patient's reflexes were 1+ in all extremities and she had no pathological reflexes.  Sensory - Light touch, temperature/pinprick, vibration and proprioception, and Romberg testing were assessed and were symmetrical.    Coordination - The patient had normal movements in the hands and feet with no ataxia or dysmetria.  Tremor was absent.  Gait and Station - deferred due to safety concerns.   ASSESSMENT/PLAN [name] is a 59 y.o. female with history of hypertension, hyperlipidemia, asthma, diabetes mellitus, presented initially to [facility] ED with 5 day history of diplopia, moderate to severe  right-sided headache and gait instability.  She did not receive IV t-PA due to delay in arrival.   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   2D Echo  unremarkable  CT angio neck unremarkable    EEG pending  TEE to look for embolic source. Arranged with [facility] for tomorrow.  If positive for PFO (patent foramen ovale), check bilateral lower extremity venous dopplers to rule out DVT as possible source of stroke. (I have made patient NPO after midnight tonight).   If TEE negative, a [facility] electrophysiologist will consult and consider placement of an implantable loop recorder to evaluate for atrial fibrillation as  etiology of stroke. This has been explained to patient/family by Dr. Erlinda Hong and they are agreeable. (Of note, pt is a NA and thinks she may have Afib, she reports episodes of heart racing without significant activity, recurrent for some time - states she has had episodes since of admission but no tele evidence seen in EPIC -  She was placed on lopressor by [facility] - defer plan to cardiology opinion)  LDL 138   HgbA1c pending  Lovenox 40 mg sq daily for VTE prophylaxis  Diet Carb Modified Fluid consistency:: Thin; Room service appropriate?: Yes  No antithrombotic prior to admission, now on aspirin 325 mg daily.  Patient counseled to be compliant with her antithrombotic medications  Ongoing aggressive stroke risk factor management  Therapy recommendations:  OP OT  Disposition:  pending   Palpitation  Concerning for afib  Need cardiac monitoring   Potential loop placement if TEE negative  Hypertensive Emergency  Elevated 250/107 on arrival  Improved today to 152/76-167/94  Permissive hypertension (OK if < 220/120) but gradually normalize in 5-7 days  Hyperlipidemia  Home meds:  crestor 20, resumed in hospital  LDL 138, goal < 70  Continue statin at discharge  Diabetes  HgbA1c pending, goal < 7.0  Uncontrolled  SSI  Tobacco abuse  Current smoker  Smoking cessation counseling provided  Pt is willing to quit  Other Stroke Risk Factors  ETOH use (not drinking currently)  Hx crack cocaine use, UDS neg this admission  Family hx stroke (father)  Other Active Problems  depressioin  Hospital day # 1  Rosalin Hawking, MD PhD Stroke Neurology 08/07/2015 1:17 AM    To contact Stroke Continuity provider, please refer to [email or web address] After hours, contact General Neurology

## 2015-08-06 NOTE — Progress Notes (Signed)
  Echocardiogram 2D Echocardiogram has been performed.  Lisa Anderson 08/06/2015, 8:39 AM

## 2015-08-06 NOTE — Care Management Note (Signed)
Case Management Note  Patient Details  Name: Lisa Anderson MRN: [id number] Date of Birth: 05-03-1956  Subjective/Objective:                    Action/Plan: Patient admitted with CVA. Patient is from home and has been noncompliant with meds recently d/t lack of insurance but currently has [employer]. Patient also has no PCP listed. Awaiting PT/OT recommendations for discharge disposition. CM will continue to follow for discharge needs.   Expected Discharge Date:                  Expected Discharge Plan:  Home/Self Care  In-House Referral:     Discharge planning Services     Post Acute Care Choice:    Choice offered to:     DME Arranged:    DME Agency:     HH Arranged:    HH Agency:     Status of Service:  In process, will continue to follow  Medicare Important Message Given:    Date Medicare IM Given:    Medicare IM give by:    Date Additional Medicare IM Given:    Additional Medicare Important Message give by:     If discussed at [place] of Stay Meetings, dates discussed:    Additional Comments:  Pollie Friar, RN 08/06/2015, 11:37 AM

## 2015-08-06 NOTE — Evaluation (Signed)
Physical Therapy Evaluation Patient Details Name: Lisa Anderson MRN: [id number] DOB: 1956-05-24 Today's Date: 08/06/2015   History of Present Illness  Patient is a 59 y/o female who presents with visual changes, gait disturbance and HA. Head CT- shows cortical low attenuation in the right parietal region. MRI-small right acute parieto-occipital stroke. PMH of HTN (pt has not been taking meds since she ran out), HLD, DM.  Clinical Impression  Patient presents close to functional baseline with decreased gait speed per pt report. Reports no symptoms during session despite higher level balance challenges. Only minor deviations in gait noted initially during horizontal head turns but this improved with increased distance. Tolerated gait training and stair training without overt LOB or difficulty. Education re: signs/symptoms of CVA. Pt does not require further skilled therapy services. Encouraged daily ambulation with RN. Discharge from therapy.     Follow Up Recommendations No PT follow up;Supervision - Intermittent    Equipment Recommendations  None recommended by PT    Recommendations for Other Services       Precautions / Restrictions Precautions Precautions: None Restrictions Weight Bearing Restrictions: No      Mobility  Bed Mobility Overal bed mobility: Modified Independent                Transfers Overall transfer level: Needs assistance Equipment used: None Transfers: Sit to/from Stand Sit to Stand: Modified independent (Device/Increase time)         General transfer comment: Slow to stand but steady.  Ambulation/Gait Ambulation/Gait assistance: Supervision Ambulation Distance (Feet): 300 Feet Assistive device: None Gait Pattern/deviations: Step-through pattern;Decreased stride length Gait velocity: reduced Gait velocity interpretation: <1.8 ft/sec, indicative of risk for recurrent falls General Gait Details: Slow, steady gait. Only minor gait deviations  noted with head movements. No dizziness or symptoms reported during challenges.   Stairs Stairs: Yes Stairs assistance: Modified independent (Device/Increase time) Stair Management: Alternating pattern;One rail Right Number of Stairs: 13 General stair comments: No dizziness/symptoms reported.   Wheelchair Mobility    Modified Rankin (Stroke Patients Only) Modified Rankin (Stroke Patients Only) Pre-Morbid Rankin Score: No symptoms Modified Rankin: No significant disability     Balance Overall balance assessment: Needs assistance Sitting-balance support: Feet supported;No upper extremity supported Sitting balance-Leahy Scale: Normal     Standing balance support: During functional activity Standing balance-Leahy Scale: Good               High level balance activites: Direction changes;Turns;Sudden stops;Head turns High Level Balance Comments: Tolerated above challenges wtih only minor deviations in gait during horizontal head turns. Able to perform changes in gait speed, turns and picking up object from floor without difficulty or LOB. Standardized Balance Assessment Standardized Balance Assessment : Dynamic Gait Index   Dynamic Gait Index Level Surface: Normal Change in Gait Speed: Normal Gait with Horizontal Head Turns: Mild Impairment Gait with Vertical Head Turns: Normal Gait and Pivot Turn: Normal Step Over Obstacle: Normal Step Around Obstacles: Normal Steps: Mild Impairment Total Score: 22       Pertinent Vitals/Pain Pain Assessment: No/denies pain    Home Living Family/patient expects to be discharged to:: Private residence Living Arrangements: Children;Other relatives (lives with daughter and 2 grandchildren) Available Help at Discharge: Family;Available PRN/intermittently Type of Home: House Home Access: Level entry     Home Layout: Two level;Bed/bath upstairs Home Equipment: None      Prior Function Level of Independence: Independent          Comments: Works as Quarry manager at [employer].     Hand Dominance   Dominant Hand: Right    Extremity/Trunk Assessment   Upper Extremity Assessment: Defer to OT evaluation (Left grip seems slightly decreased compared to right.)           Lower Extremity Assessment: Overall WFL for tasks assessed         Communication   Communication: No difficulties  Cognition Arousal/Alertness: Awake/alert Behavior During Therapy: WFL for tasks assessed/performed Overall Cognitive Status: Within Functional Limits for tasks assessed                      General Comments General comments (skin integrity, edema, etc.): Able to track in all visual fields without difficulty. No saccades noted. No blurriness.     Exercises        Assessment/Plan    PT Assessment Patent does not need any further PT services  PT Diagnosis Difficulty walking   PT Problem List    PT Treatment Interventions     PT Goals (Current goals can be found in the Care Plan section) Acute Rehab PT Goals Patient Stated Goal: to get back to my patients and work PT Goal Formulation: All assessment and education complete, DC therapy Time For Goal Achievement: 08/20/15 Potential to Achieve Goals: Good    Frequency     Barriers to discharge        Co-evaluation               End of Session Equipment Utilized During Treatment: Gait belt Activity Tolerance: Patient tolerated treatment well Patient left: in chair;with call bell/phone within reach Nurse Communication: Mobility status         Time: 0912-0935 PT Time Calculation (min) (ACUTE ONLY): 23 min   Charges:   PT Evaluation $Initial PT Evaluation Tier I: 1 Procedure PT Treatments $Gait Training: 8-22 mins   PT G Codes:        Etienne Millward A Kayly Kriegel 08/06/2015, 9:50 AM Wray Kearns, [place], DPT [phone]

## 2015-08-06 NOTE — Evaluation (Signed)
Speech Language Pathology Evaluation Patient Details Name: Lisa Anderson MRN: [id number] DOB: 1956/01/12 Today's Date: 08/06/2015 Time: [id number] SLP Time Calculation (min) (ACUTE ONLY): 34 min  Problem List:  Patient Active Problem List   Diagnosis Date Noted  . Stroke ([place]) 08/05/2015  . Hypertension 08/05/2015  . 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 Diabetes mellitus 08/05/2015  . Alcohol dependency ([place]) 08/10/2013  . Cocaine abuse 08/08/2013  . Alcohol abuse 08/08/2013  . MDD (major depressive disorder) ([place]) 08/08/2013   Past Medical History:  Past Medical History  Diagnosis Date  . Hypertension   . Diabetes mellitus   . Hyperlipidemia   . Asthma   . Cancer [facility])    Past Surgical History:  Past Surgical History  Procedure Laterality Date  . Tonsillectomy    . Breast lumpectomy     HPI:  pt is a 59 yo female adm to [facility] with vision difficulties - found to have a right parietal occipital cva.  PMH + for smoking, DM, HTN.  Speech evaluation ordered.   Assessment / Plan / Recommendation Clinical Impression  Pt presents with strengths in areas of orientation, awareness, speech and language.  She did have memory recall deficits on MOCA requiring cues to recall 5/5 words - including category and multiple choice cue. Some functional memory difficulties present PTA as pt reports losing her twice within a short time frame and subsequently being late to work. ? Impact of ETOH on pt's functional status.     Mathematical subtraction was difficult for pt despite use of paper and pencil as pt stated "I can't think".  Advised pt to use calculator for her addition requirements at work if math continues to be problematic (pt is a CNA and adds intake/voiding amounts).    SLP provided pt with written compenstion strategies for memory deficits and had pt read them to assure understanding.  No further SLP indicated as all education completed.      SLP Assessment  Patient does not need any further Speech Lanaguage Pathology Services    Follow Up Recommendations  None    Frequency and Duration   n/a        SLP Evaluation Prior Functioning  Cognitive/Linguistic Baseline: Within functional limits Type of Home: House Available Help at Discharge: Family;Available PRN/intermittently Education: 10th grade   Cognition  Overall Cognitive Status: Impaired/Different from baseline Arousal/Alertness: Awake/alert Orientation Level: Oriented X4 Attention: Sustained;Selective Sustained Attention: Appears intact Selective Attention: Appears intact Memory: Impaired Memory Impairment: Retrieval deficit (pt recalled 5/5 words with cues - 2 with category, 3 with choice of 3) Awareness: Appears intact Problem Solving: Appears intact Safety/Judgment: Appears intact    Comprehension  Auditory Comprehension Overall Auditory Comprehension: Appears within functional limits for tasks assessed Yes/No Questions: Not tested Commands: Within Functional Limits Conversation: Complex Visual Recognition/Discrimination Discrimination: Within Function Limits Reading Comprehension Reading Status: Within funtional limits    Expression Expression Primary Mode of Expression: Verbal Verbal Expression Initiation: No impairment Level of Generative/Spontaneous Verbalization: Conversation Repetition: Impaired Level of Impairment: Sentence level (for specific articles, etc) Naming: No impairment Pragmatics: No impairment Written Expression Dominant Hand: Right Written Expression: Within Functional Limits   Oral / Motor Oral Motor/Sensory Function Overall Oral Motor/Sensory Function: Within functional limits Motor Speech Overall Motor Speech: Appears within functional limits for tasks assessed Respiration: Within functional limits Resonance: Within functional limits Articulation: Within functional limitis Intelligibility: Intelligible Motor  Planning: Witnin functional limits    Luanna Salk, MS [facility]  SLP [phone]

## 2015-08-06 NOTE — Progress Notes (Signed)
    [facility] has been requested to perform a transesophageal echocardiogram on Lisa Anderson for stroke.  After careful review of history and examination, the risks and benefits of transesophageal echocardiogram have been explained including risks of esophageal damage, perforation (1:10,000 risk), bleeding, pharyngeal hematoma as well as other potential complications associated with conscious sedation including aspiration, arrhythmia, respiratory failure and death. Alternatives to treatment were discussed, questions were answered. Patient is willing to proceed.   Erlene Quan, PA 08/06/2015 2:14 PM

## 2015-08-07 ENCOUNTER — Inpatient Hospital Stay (HOSPITAL_COMMUNITY)
Admit: 2015-08-07 | Discharge: 2015-08-07 | Disposition: A | Discharge status: Home / Self Care | Payer: PRIVATE HEALTH INSURANCE | Attending: Neurology | Admitting: Neurology

## 2015-08-07 ENCOUNTER — Encounter (HOSPITAL_COMMUNITY)
Admission: EM | Disposition: A | Discharge status: Home / Self Care | Payer: Self-pay | Source: Home / Self Care | Attending: Internal Medicine

## 2015-08-07 ENCOUNTER — Ambulatory Visit (HOSPITAL_COMMUNITY): Payer: PRIVATE HEALTH INSURANCE

## 2015-08-07 ENCOUNTER — Encounter (HOSPITAL_COMMUNITY): Payer: Self-pay

## 2015-08-07 DIAGNOSIS — R002 Palpitations: Secondary | ICD-10-CM | POA: Insufficient documentation

## 2015-08-07 DIAGNOSIS — I639 Cerebral infarction, unspecified: Secondary | ICD-10-CM | POA: Diagnosis not present

## 2015-08-07 DIAGNOSIS — E785 Hyperlipidemia, unspecified: Secondary | ICD-10-CM | POA: Insufficient documentation

## 2015-08-07 DIAGNOSIS — I63431 Cerebral infarction due to embolism of right posterior cerebral artery: Principal | ICD-10-CM

## 2015-08-07 DIAGNOSIS — I63019 Cerebral infarction due to thrombosis of unspecified vertebral artery: Secondary | ICD-10-CM

## 2015-08-07 DIAGNOSIS — E1159 Type 2 diabetes mellitus with other circulatory complications: Secondary | ICD-10-CM | POA: Insufficient documentation

## 2015-08-07 HISTORY — PX: EP IMPLANTABLE DEVICE: SHX172B

## 2015-08-07 HISTORY — PX: TEE WITHOUT CARDIOVERSION: SHX5443

## 2015-08-07 LAB — HEMOGLOBIN A1C
Hgb A1c MFr Bld: 9.9 % — ABNORMAL HIGH (ref 4.8–5.6)
MEAN PLASMA GLUCOSE: 237 mg/dL

## 2015-08-07 LAB — CBC
HEMATOCRIT: 42.7 % (ref 36.0–46.0)
Hemoglobin: 13.7 g/dL (ref 12.0–15.0)
MCH: 27.3 pg (ref 26.0–34.0)
MCHC: 32.1 g/dL (ref 30.0–36.0)
MCV: 85.2 fL (ref 78.0–100.0)
Platelets: 319 10*3/uL (ref 150–400)
RBC: 5.01 MIL/uL (ref 3.87–5.11)
RDW: 12.5 % (ref 11.5–15.5)
WBC: 8.9 10*3/uL (ref 4.0–10.5)

## 2015-08-07 LAB — BASIC METABOLIC PANEL
ANION GAP: 11 (ref 5–15)
BUN: 8 mg/dL (ref 6–20)
CO2: 27 mmol/L (ref 22–32)
Calcium: 9.2 mg/dL (ref 8.9–10.3)
Chloride: 102 mmol/L (ref 101–111)
Creatinine, Ser: 0.81 mg/dL (ref 0.44–1.00)
GFR calc Af Amer: >60 mL/min (ref >60)
GLUCOSE: 137 mg/dL — AB (ref 65–99)
POTASSIUM: 3.3 mmol/L — AB (ref 3.5–5.1)
Sodium: 140 mmol/L (ref 135–145)

## 2015-08-07 LAB — GLUCOSE, CAPILLARY
GLUCOSE-CAPILLARY: 115 mg/dL — AB (ref 65–99)
Glucose-Capillary: 154 mg/dL — ABNORMAL HIGH (ref 65–99)

## 2015-08-07 SURGERY — LOOP RECORDER INSERTION
Anesthesia: LOCAL

## 2015-08-07 SURGERY — ECHOCARDIOGRAM, TRANSESOPHAGEAL
Anesthesia: Moderate Sedation

## 2015-08-07 MED ORDER — HYDROXYZINE HCL 25 MG PO TABS: 25.0000 mg | ORAL_TABLET | Freq: Two times a day (BID) | ORAL | Status: DC | PRN

## 2015-08-07 MED ORDER — ASPIRIN 325 MG PO TABS: 325.0000 mg | ORAL_TABLET | Freq: Every day | ORAL | Status: DC

## 2015-08-07 MED ORDER — MIDAZOLAM HCL 5 MG/ML IJ SOLN
INTRAMUSCULAR | Status: AC
  Filled 2015-08-07: qty 2

## 2015-08-07 MED ORDER — LIDOCAINE-EPINEPHRINE 1 %-1:100000 IJ SOLN
INTRAMUSCULAR | Status: AC
Start: 2015-08-07 — End: 2015-08-07
  Filled 2015-08-07: qty 1

## 2015-08-07 MED ORDER — HYDRALAZINE HCL 50 MG PO TABS: 50.0000 mg | ORAL_TABLET | Freq: Three times a day (TID) | ORAL | Status: DC

## 2015-08-07 MED ORDER — MIDAZOLAM HCL 10 MG/2ML IJ SOLN
INTRAMUSCULAR | Status: DC | PRN
Start: 2015-08-07 — End: 2015-08-07
  Administered 2015-08-07 (×3): 2 mg via INTRAVENOUS

## 2015-08-07 MED ORDER — DULOXETINE HCL 30 MG PO CPEP: 30.0000 mg | ORAL_CAPSULE | Freq: Every day | ORAL | Status: DC

## 2015-08-07 MED ORDER — INSULIN ASPART 100 UNIT/ML ~~LOC~~ SOLN: SUBCUTANEOUS | Status: DC

## 2015-08-07 MED ORDER — FENTANYL CITRATE (PF) 100 MCG/2ML IJ SOLN
INTRAMUSCULAR | Status: AC
  Filled 2015-08-07: qty 2

## 2015-08-07 MED ORDER — LIDOCAINE VISCOUS 2 % MT SOLN
OROMUCOSAL | Status: DC | PRN
  Administered 2015-08-07: 20 mL via OROMUCOSAL

## 2015-08-07 MED ORDER — RAMELTEON 8 MG PO TABS
8.0000 mg | ORAL_TABLET | Freq: Every day | ORAL | Status: DC
Start: 2015-08-07 — End: 2015-09-02

## 2015-08-07 MED ORDER — FLUOXETINE HCL 20 MG PO TABS
20.0000 mg | ORAL_TABLET | Freq: Every day | ORAL | Status: DC
Start: 2015-08-07 — End: 2015-09-02

## 2015-08-07 MED ORDER — BUTAMBEN-TETRACAINE-BENZOCAINE 2-2-14 % EX AERO
INHALATION_SPRAY | CUTANEOUS | Status: DC | PRN
  Administered 2015-08-07: 1 via TOPICAL

## 2015-08-07 MED ORDER — LIDOCAINE-EPINEPHRINE 1 %-1:100000 IJ SOLN
INTRAMUSCULAR | Status: DC | PRN
  Administered 2015-08-07: 10 mL

## 2015-08-07 MED ORDER — GLIPIZIDE 5 MG PO TABS: 5.0000 mg | ORAL_TABLET | Freq: Two times a day (BID) | ORAL | Status: DC

## 2015-08-07 MED ORDER — TRIAMTERENE-HCTZ 37.5-25 MG PO TABS
1.0000 | ORAL_TABLET | Freq: Every day | ORAL | Status: DC
Start: 2015-08-07 — End: 2015-08-07
  Administered 2015-08-07: 1 via ORAL
  Filled 2015-08-07: qty 1

## 2015-08-07 MED ORDER — METFORMIN HCL 1000 MG PO TABS
1000.0000 mg | ORAL_TABLET | Freq: Two times a day (BID) | ORAL | Status: DC
Start: 2015-08-07 — End: 2017-01-02

## 2015-08-07 MED ORDER — DIPHENHYDRAMINE HCL 50 MG/ML IJ SOLN
INTRAMUSCULAR | Status: AC
  Filled 2015-08-07: qty 1

## 2015-08-07 MED ORDER — LIDOCAINE VISCOUS 2 % MT SOLN
OROMUCOSAL | Status: AC
Start: 2015-08-07 — End: 2015-08-07
  Filled 2015-08-07: qty 15

## 2015-08-07 MED ORDER — AMLODIPINE BESYLATE 5 MG PO TABS: 5.0000 mg | ORAL_TABLET | Freq: Every day | ORAL | Status: DC

## 2015-08-07 MED ORDER — FENTANYL CITRATE (PF) 100 MCG/2ML IJ SOLN
INTRAMUSCULAR | Status: DC | PRN
  Administered 2015-08-07: 12.5 ug via INTRAVENOUS
  Administered 2015-08-07: 25 ug via INTRAVENOUS
  Administered 2015-08-07 (×2): 12.5 ug via INTRAVENOUS

## 2015-08-07 MED ORDER — ROSUVASTATIN CALCIUM 20 MG PO TABS
20.0000 mg | ORAL_TABLET | Freq: Every evening | ORAL | Status: DC
Start: 2015-08-07 — End: 2015-11-02

## 2015-08-07 MED ORDER — DILTIAZEM HCL ER 240 MG PO CP24
240.0000 mg | ORAL_CAPSULE | Freq: Every morning | ORAL | Status: DC
Start: 2015-08-07 — End: 2015-11-16

## 2015-08-07 MED ORDER — TRIAMTERENE-HCTZ 37.5-25 MG PO TABS: 1.0000 | ORAL_TABLET | Freq: Every day | ORAL | Status: DC

## 2015-08-07 SURGICAL SUPPLY — 2 items
LOOP REVEAL LINQSYS (Prosthesis & Implant Heart) ×2 IMPLANT
PACK LOOP INSERTION (CUSTOM PROCEDURE TRAY) ×2 IMPLANT

## 2015-08-07 NOTE — Progress Notes (Signed)
STROKE TEAM PROGRESS NOTE   SUBJECTIVE (INTERVAL HISTORY) Patient just back from TEE. Awake but still lethargic from sedatives.   OBJECTIVE Temp:  [98 F (36.7 C)-98.7 F (37.1 C)] 98.3 F (36.8 C) (11/11 1117) Pulse Rate:  [67-107] 74 (11/11 1130) Cardiac Rhythm:  [-] Normal sinus rhythm (11/11 0847) Resp:  [11-20] 18 (11/11 1130) BP: (148-239)/(62-97) 184/90 mmHg (11/11 1130) SpO2:  [92 %-100 %] 92 % (11/11 1130)  CBC:  Recent Labs Lab 08/05/15 1105  08/05/15 1957 08/07/15 0606  WBC 9.1  --  8.4 8.9  NEUTROABS 4.7  --   --   --   HGB 14.1  < > 12.7 13.7  HCT 42.7  < > 39.2 42.7  MCV 85.2  --  85.2 85.2  PLT 295  --  284 319  < > = values in this interval not displayed.  Basic Metabolic Panel:   Recent Labs Lab 08/05/15 1105 08/05/15 1114 08/05/15 1957 08/07/15 0606  NA 138 141  --  140  K 3.4* 3.5  --  3.3*  CL 100* 98*  --  102  CO2 31  --   --  27  GLUCOSE 166* 165*  --  137*  BUN 12 12  --  8  CREATININE 0.90 0.90 0.81 0.81  CALCIUM 9.1  --   --  9.2    Lipid Panel:     Component Value Date/Time   CHOL 190 08/06/2015 0607   TRIG 91 08/06/2015 0607   HDL 34* 08/06/2015 0607   CHOLHDL 5.6 08/06/2015 0607   VLDL 18 08/06/2015 0607   LDLCALC 138* 08/06/2015 0607   HgbA1c:  Lab Results  Component Value Date   HGBA1C 9.9* 08/06/2015   Urine Drug Screen:     Component Value Date/Time   LABOPIA NONE DETECTED 08/05/2015 1303   COCAINSCRNUR NONE DETECTED 08/05/2015 1303   LABBENZ NONE DETECTED 08/05/2015 1303   AMPHETMU NONE DETECTED 08/05/2015 1303   THCU NONE DETECTED 08/05/2015 1303   LABBARB NONE DETECTED 08/05/2015 1303      IMAGING  Ct Head Wo Contrast 08/05/2015  1. Area of cortical low-attenuation the right parietal region, suspicious for evolving cortically based infarct. This could be confirmed with MRI of the brain with and without IV gadolinium. 2. Very mild chronic microvascular ischemic changes in the cerebral white matter, as  above. 3. Paranasal sinus disease, as above, including a small air-fluid level in the right maxillary sinus, which could indicate acute sinusitis.   Ct Angio Neck W/cm &/or Wo/cm 08/06/2015   No significant carotid or vertebral artery stenosis in the neck.   Mr [name] Head Wo Contrast 08/05/2015  1. Segmental occlusion or near complete occlusion of the proximal right M2 inferior division. 2. Mild right M1 stenosis. 3. Moderate distal right vertebral artery stenosis. 4. 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   Mr Brain Wo Contrast 08/05/2015  There is mild right parieto-occipital cortical cytotoxic edema related to the acute infarct. No intracranial hemorrhage, mass, midline shift, or extra-axial fluid collection is seen. Small foci of T2 hyperintensity in the subcortical and deep cerebral white matter bilaterally and in the pons are nonspecific but compatible with mild chronic small vessel ischemic disease. Orbits are unremarkable. There is mild right maxillary sinus mucosal thickening with small volume fluid/secretions. Polypoid mucosal thickening is noted in the left sphenoid and left maxillary sinuses, and there is mild bilateral ethmoid air cell mucosal thickening. Trace bilateral mastoid effusions are noted. Major  intracranial vascular flow voids are preserved.   2D Echocardiogram  - Left ventricle: The cavity size was normal. Wall thickness wasnormal. Systolic function was normal. The estimated ejectionfraction was in the range of 55% to 60%. Wall motion was normal;there were no regional wall motion abnormalities. Dopplerparameters are consistent with abnormal left ventricularrelaxation (grade 1 diastolic dysfunction). - Left atrium: The atrium was mildly dilated. - Atrial septum: No defect or patent foramen ovale was identified.  EEG - This is an abnormal electroencephalogram due to sharp activity noted in the left temporal region. This is suggestive of a focal disturbance  with epileptogenic potential.  TEE  No obvious source of embolism. Normal LV size and function Normal RV size and function Normal RA Normal LA and LA appendage Normal TV with trivial TR Normal PV Normal MV with trivial MR Normal trileaflet AV Normal interatrial septum with no evidence of shunt by colorflow dopper or agitated saline contrast Normal thoracic and ascending aorta.   PHYSICAL EXAM General - Well nourished, well developed, in no apparent distress.  Ophthalmologic - Sharp disc margins OU.  Cardiovascular - Regular rate and rhythm with no murmur.  Mental Status -  Level of arousal and orientation to time, place, and person were intact. Language including expression, naming, repetition, comprehension was assessed and found intact. Fund of Knowledge was assessed and was intact.  Cranial Nerves II - XII - II - Visual field intact OU. III, IV, VI - Extraocular movements intact. V - Facial sensation intact bilaterally. VII - Facial movement intact bilaterally. VIII - Hearing & vestibular intact bilaterally. X - Palate elevates symmetrically. XI - Chin turning & shoulder shrug intact bilaterally. XII - Tongue protrusion intact.  Motor Strength - The patient's strength was normal in all extremities and pronator drift was absent.  Bulk was normal and fasciculations were absent.   Motor Tone - Muscle tone was assessed at the neck and appendages and was normal.  Reflexes - The patient's reflexes were 1+ in all extremities and she had no pathological reflexes.  Sensory - Light touch, temperature/pinprick, vibration and proprioception, and Romberg testing were assessed and were symmetrical.    Coordination - The patient had normal movements in the hands and feet with no ataxia or dysmetria.  Tremor was absent.  Gait and Station - deferred due to safety concerns.   ASSESSMENT/PLAN [name] is a 59 y.o. female with history of hypertension, hyperlipidemia,  asthma, diabetes mellitus, presented initially to [facility] ED with 5 day history of diplopia, moderate to severe right-sided headache and gait instability.  She did not receive IV t-PA due to delay in arrival.   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   2D Echo  unremarkable  CT angio neck unremarkable    EEG sharp activity noted in the left temporal region. a focal disturbance with epileptogenic potential.- will follow up as an OP. No antiepileptics indicated at this time. Dr. Jaynee Eagles discussed with her in detail.  TEE unremarkable  [place] electrophysiologist to determine need for placement of an implantable loop recorder to evaluate for atrial fibrillation as etiology of stroke.   LDL 138   HgbA1c 9.9  Lovenox 40 mg sq daily for VTE prophylaxis Diet heart healthy/carb modified Room service appropriate?: Yes; Fluid consistency:: Thin  No antithrombotic  prior to admission, now on aspirin 325 mg daily.  Patient counseled to be compliant with her antithrombotic medications  Ongoing aggressive stroke risk factor management  Therapy recommendations:  OP OT, no PT needs  Disposition:  Home with OP OT  NOTHING FURTHER TO ADD FROM THE STROKE STANDPOINT once loop recorder placed. Ok for discharge from stroke standpoint  Patient has a 10-15% risk of having another stroke over the next year, the highest risk is within 2 weeks of the most recent stroke/TIA (risk of having a stroke following a stroke or TIA is the same).  Ongoing risk factor control by Primary Care Physician  Follow-up Stroke Clinic at [facility] with Dr. Rosalin Hawking or Dr. Jaynee Eagles in 2 months, order placed.   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 EP consulting  Hypertensive Emergency  Elevated 250/107 on arrival  Remains elevated today  Permissive hypertension (OK if < 220/120) but gradually normalize in 5-7 days  Hyperlipidemia  Home meds:  crestor 20, resumed in hospital  LDL 138, goal < 70  Continue statin at discharge  Diabetes  HgbA1c 9.9, goal < 7.0  Uncontrolled  SSI  Tobacco abuse  Current smoker  Smoking cessation counseling provided  Pt is willing to quit  Other Stroke Risk Factors  ETOH use (not drinking currently)  Hx crack cocaine use, UDS neg this admission  Family hx stroke (father)  Other Active Problems  depressioin  Discussed with Dr. Tana Coast   Personally examined patient and images, and have participated in and made any corrections needed to history, physical, neuro exam,assessment and plan as stated above.  I have personally obtained the history, evaluated lab date, reviewed imaging studies and agree with radiology interpretations.   Sarina Ill, MD Stroke Neurology [phone] [facility]      To contact Stroke Continuity provider, please refer to [email or web address] After hours, contact General Neurology

## 2015-08-07 NOTE — Progress Notes (Signed)
Echocardiogram Echocardiogram Transesophageal has been performed.  Joelene Millin 08/07/2015, 11:22 AM

## 2015-08-07 NOTE — Progress Notes (Signed)
[facility]                                                                              Patient Demographics  Lisa Anderson, is a 59 y.o. female, DOB - 1956/01/11, [id number]  Admit date - 08/05/2015   Admitting Physician Nita Sells, MD  Outpatient Primary MD for the patient is No primary care provider on file.  LOS - 2   Chief Complaint  Patient presents with  . Stroke Symptoms  . Eye Problem  . Headache       Brief HPI   Patient is a 59 year old female with hypertension, hyperlipidemia, asthma, diabetes mellitus, noncompliant presented initially to [facility] ED on 11/19 with 5 day history of diplopia, moderate to severe right-sided headache and gait instability. Patient went to ophthalmologist and her vision was apparently found to be normal. However because of the persistent visual changes, patient was admitted by the ophthalmologist to come to ED for stroke workup. CT scan was suggestive of stroke and MRI also confirmed a small right acute parieto-occipital CVA. Patient had run out of her BP medications in months, BP was 250/107 at the time of admission. Patient was transferred [facility] for further stroke workup.   Assessment & Plan    Principal Problem:  Acute CVA Stroke [facility]): With underlying history of hypertension, hyperlipidemia, diabetes, severe noncompliance - MRI of the brain confirmed small right acute parieto-occipital CVA - MRA showed segmental occlusion or near complete occlusion of the proximal right M2 inferior division. 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  - Patient placed on aspirin daily, neurology consulted - Follow HbA1c , LDL of 138, continue Crestor - Per neurology, CT neck showed no sig carotid or vertebral artery stenosis in the neck - TEE normal, no PFO, cardiology/EP consulted for loop recorder - PT is recommending no PT follow-up - EEG abnormal due to sharp activity in the left temporal  region suggestive of focal disturbance with epileptic potential, awaiting neurology recommendations regarding any seizure medications   Active Problems:   Cocaine abuse: Prior history of cocaine abuse UDS negative for cocaine this admission    Alcohol abuse - Alcohol level less than 5, patient reports not drinking  Uncontrolled hypertension/accelerated hypertension - Continue Cardizem, restarted hydralazine, triamterene HCTZ today - Allergic to benicar, due to history of cocaine, will not start beta blocker     Diabetes mellitus - Hemoglobin A1c 9.9, currently continue sliding scale insulin - On metformin and glipizide outpatient  Depression Continue Cymbalta  Code Status: Full CODE STATUS   Family Communication: Discussed in detail with the patient, all imaging results, lab results explained to the patient    Disposition Plan:  Possibly tomorrow if workup is complete  Time Spent in minutes 25 minutes  Procedures  MRI, MRA brain, CT angiogram of neck TEE  Consults   Neuro Cardiology  DVT Prophylaxis Lovenox   Medications  Scheduled Meds: .  stroke: mapping our early stages of recovery book   Does not apply Once  . aspirin  300 mg Rectal Daily   Or  . aspirin  325 mg Oral Daily  .  diltiazem  240 mg Oral q morning - 10a  . DULoxetine  30 mg Oral Daily  . enoxaparin (LOVENOX) injection  40 mg Subcutaneous Q24H  . FLUoxetine  20 mg Oral Daily  . insulin aspart  0-5 Units Subcutaneous QHS  . insulin aspart  0-9 Units Subcutaneous TID WC  . ramelteon  8 mg Oral QHS  . rosuvastatin  20 mg Oral q1800  . triamterene-hydrochlorothiazide  1 tablet Oral Daily   Continuous Infusions:  PRN Meds:.albuterol, hydrOXYzine, senna-docusate   Antibiotics   Anti-infectives    None        Subjective:   Lisa Anderson was seen and examined today. Patient denies dizziness, chest pain, shortness of breath, abdominal pain, N/V/D/C, new weakness, numbess, tingling. No  acute events overnight.    Objective:   Blood pressure 184/90, pulse 74, temperature 98.3 F (36.8 C), temperature source Oral, resp. rate 18, SpO2 92 %.  Wt Readings from Last 3 Encounters:  08/07/13 81.194 kg (179 lb)  02/09/13 74.844 kg (165 lb)  12/15/12 74.844 kg (165 lb)    No intake or output data in the 24 hours ending 08/07/15 1132  Exam  General: Alert and oriented x 3, NAD  HEENT:  PERRLA, EOMI, Anicteric Sclera, mucous membranes moist.   Neck: Supple, no JVD, no masses  CVS: S1 S2 clear, RRR  Respiratory: CTAB  Abdomen: Soft, nontender, nondistended, + bowel sounds  Ext: no cyanosis clubbing or edema  Neuro: AAOx3, Cr N's II- XII. Strength 5/5 upper and lower extremities bilaterally  Skin: No rashes  Psych: Normal affect and demeanor, alert and oriented x3    Data Review   Micro Results No results found for this or any previous visit (from the past 240 hour(s)).  Radiology Reports Ct Head Wo Contrast  08/05/2015  CLINICAL DATA:  59 year old female with 5 day history of visual and proprioceptive problems. Weakness and headache. Predominantly right-sided headache with shooting pains to the left side. EXAM: CT HEAD WITHOUT CONTRAST TECHNIQUE: Contiguous axial images were obtained from the base of the skull through the vertex without intravenous contrast. COMPARISON:  Head CT 12/14/2012. FINDINGS: In the right parietal region there is low attenuation involving the cortex, best demonstrated on images 16-18 of series 2, suspicious for evolving ischemia (likely subacute), new compared to prior study 12/14/2012. Patchy areas of mild decreased attenuation are noted throughout the deep and periventricular white matter of the cerebral hemispheres bilaterally, compatible with mild chronic microvascular ischemic disease. No other acute intracranial abnormalities. Specifically, no evidence of acute intracranial hemorrhage, no definite mass, mass effect, hydrocephalus or  abnormal intra or extra-axial fluid collections. Multifocal mucosal thickening throughout the ethmoid and maxillary sinuses bilaterally. Small air-fluid level in the right maxillary sinus. Mucous retention cyst or polyp in the anterior aspect of the left maxillary sinus measuring 1.4 cm in diameter incidentally noted. Mastoids are well pneumatized. No acute displaced skull fractures are identified. IMPRESSION: 1. Area of cortical low-attenuation the right parietal region, suspicious for evolving cortically based infarct. This could be confirmed with MRI of the brain with and without IV gadolinium. 2. Very mild chronic microvascular ischemic changes in the cerebral white matter, as above. 3. Paranasal sinus disease, as above, including a small air-fluid level in the right maxillary sinus, which could indicate acute sinusitis. Critical Value/emergent results were called by telephone at the time of interpretation on 08/05/2015 at 12:02 pm to Dr. Deno Etienne , who verbally acknowledged these results. Electronically Signed   By:  Vinnie Langton M.D.   On: 08/05/2015 12:04   Ct Angio Neck W/cm &/or Wo/cm  08/06/2015  CLINICAL DATA:  Right parietal stroke. EXAM: CT ANGIOGRAPHY NECK TECHNIQUE: Multidetector CT imaging of the neck was performed using the standard protocol during bolus administration of intravenous contrast. Multiplanar CT image reconstructions and MIPs were obtained to evaluate the vascular anatomy. Carotid stenosis measurements (when applicable) are obtained utilizing NASCET criteria, using the distal internal carotid diameter as the denominator. CONTRAST:  150mL OMNIPAQUE IOHEXOL 350 MG/ML SOLN COMPARISON:  MRI and MRA head 08/05/2015 FINDINGS: Aortic arch: Normal aortic arch without stenosis or dissection. Proximal great vessels widely patent. Lung apices clear. Right carotid system: Right common carotid artery is normal. Very mild atherosclerotic plaque in the carotid bulb without significant stenosis.  No dissection Left carotid system: Left common carotid artery widely patent. Minimal atherosclerotic plaque in the carotid bulb without stenosis or dissection Vertebral arteries:Left vertebral artery dominant. Both vertebral arteries patent to the basilar without stenosis or dissection. Skeleton: Negative Other neck: Negative for mass or adenopathy in the neck. IMPRESSION: No significant carotid or vertebral artery stenosis in the neck. Electronically Signed   By: Franchot Gallo M.D.   On: 08/06/2015 11:42   Mr [name] Head Wo Contrast  08/05/2015  CLINICAL DATA:  Acute right parieto-occipital infarct on MRI earlier today. Visual field changes. EXAM: MRA HEAD WITHOUT CONTRAST TECHNIQUE: Angiographic images of the Circle of Willis were obtained using MRA technique without intravenous contrast. COMPARISON:  None. FINDINGS: Study is mildly motion degraded. The visualized distal vertebral arteries are patent with the left being mildly dominant. There is moderate distal right vertebral artery stenosis at the vertebrobasilar junction. Right PICA origin, bilateral AICA origins, and bilateral SCA origins are patent. Basilar artery is patent without stenosis. The there is a small left posterior communicating artery. PCAs are patent with mild branch vessel irregularity but no significant proximal stenosis. Internal carotid arteries are patent from skullbase to carotid termini. There is mild proximal left ICA cavernous segment stenosis. 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 The right A1 segment is dominant. No significant ACA stenosis is seen. There is mild narrowing of the proximal right M1 segment. The right MCA bifurcation is patent, however there is segmental occlusion or near complete occlusion over approximately a 5 mm length of the proximal right M2 inferior division trunk with flow identified distal to this. Left MCA is patent with mild branch vessel irregularity but no significant  proximal stenosis. IMPRESSION: 1. Segmental occlusion or near complete occlusion of the proximal right M2 inferior division. 2. Mild right M1 stenosis. 3. Moderate distal right vertebral artery stenosis. 4. 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 Electronically Signed   By: Logan Bores M.D.   On: 08/05/2015 15:31   Mr Brain Wo Contrast  08/05/2015  ADDENDUM REPORT: 08/05/2015 15:35 ADDENDUM: The second sentence of the clinical data section should read "Decreased coordination." The patient returned later in the day to complete the remaining noncontrast brain MRI sequences. Additional findings as follows: There is mild right parieto-occipital cortical cytotoxic edema related to the acute infarct. No intracranial hemorrhage, mass, midline shift, or extra-axial fluid collection is seen. Small foci of T2 hyperintensity in the subcortical and deep cerebral white matter bilaterally and in the pons are nonspecific but compatible with mild chronic small vessel ischemic disease. Orbits are unremarkable. There is mild right maxillary sinus mucosal thickening with small volume fluid/secretions. Polypoid mucosal thickening is  noted in the left sphenoid and left maxillary sinuses, and there is mild bilateral ethmoid air cell mucosal thickening. Trace bilateral mastoid effusions are noted. Major intracranial vascular flow voids are preserved. Electronically Signed   By: Logan Bores M.D.   On: 08/05/2015 15:35  08/05/2015  CLINICAL DATA:  Stroke-like symptoms. Decreased cord nasion. History of hypertension, diabetes, and hyperlipidemia. EXAM: MRI HEAD WITHOUT CONTRAST TECHNIQUE: Multiplanar, multiecho pulse sequences of the brain and surrounding structures were obtained without intravenous contrast. COMPARISON:  Head CT 08/05/2015 FINDINGS: The examination had to be discontinued prior to completion due to patient claustrophobia. Axial and coronal diffusion and sagittal T1 weighted images were obtained.  There is a small region of acute cortical and subcortical infarction in the posterior right MCA territory involving the right parietal and occipital lobes corresponding to the low-density on CT earlier today. There is no evidence of gross hemorrhage, mass, or extra-axial fluid collection on this incomplete study. Ventricles and sulci are within normal limits in size for age. There is no midline shift. IMPRESSION: Incomplete examination. Small, acute right parieto-occipital infarct. Electronically Signed: By: Logan Bores M.D. On: 08/05/2015 12:44    CBC  Recent Labs Lab 08/05/15 1105 08/05/15 1114 08/05/15 1957 08/07/15 0606  WBC 9.1  --  8.4 8.9  HGB 14.1 15.0 12.7 13.7  HCT 42.7 44.0 39.2 42.7  PLT 295  --  284 319  MCV 85.2  --  85.2 85.2  MCH 28.1  --  27.6 27.3  MCHC 33.0  --  32.4 32.1  RDW 12.5  --  12.5 12.5  LYMPHSABS 3.2  --   --   --   MONOABS 0.7  --   --   --   EOSABS 0.5  --   --   --   BASOSABS 0.1  --   --   --     Chemistries   Recent Labs Lab 08/05/15 1105 08/05/15 1114 08/05/15 1957 08/07/15 0606  NA 138 141  --  140  K 3.4* 3.5  --  3.3*  CL 100* 98*  --  102  CO2 31  --   --  27  GLUCOSE 166* 165*  --  137*  BUN 12 12  --  8  CREATININE 0.90 0.90 0.81 0.81  CALCIUM 9.1  --   --  9.2  AST 16  --   --   --   ALT 15  --   --   --   ALKPHOS 113  --   --   --   BILITOT 0.7  --   --   --    ------------------------------------------------------------------------------------------------------------------ CrCl cannot be calculated (Unknown ideal weight.). ------------------------------------------------------------------------------------------------------------------  Recent Labs  08/06/15 0607  HGBA1C 9.9*   ------------------------------------------------------------------------------------------------------------------  Recent Labs  08/06/15 0607  CHOL 190  HDL 34*  LDLCALC 138*  TRIG 91  CHOLHDL 5.6    ------------------------------------------------------------------------------------------------------------------ No results for input(s): TSH, T4TOTAL, T3FREE, THYROIDAB in the last 72 hours.  Invalid input(s): FREET3 ------------------------------------------------------------------------------------------------------------------ No results for input(s): VITAMINB12, FOLATE, FERRITIN, TIBC, IRON, RETICCTPCT in the last 72 hours.  Coagulation profile  Recent Labs Lab 08/05/15 1105  INR 1.00    No results for input(s): DDIMER in the last 72 hours.  Cardiac Enzymes No results for input(s): CKMB, TROPONINI, MYOGLOBIN in the last 168 hours.  Invalid input(s): CK ------------------------------------------------------------------------------------------------------------------ Invalid input(s): [place]  08/05/15 2214 08/06/15 0638 08/06/15 1139 08/06/15 1703 08/06/15 2151 08/07/15 [place]  [place] M.D. [facility] 08/07/2015, 11:32 AM  Pager: [phone] Between 7am to 7pm - call Pager - [phone]  After 7pm go to [email or web address] - password [email or web address]  Call night coverage person covering after 7pm

## 2015-08-07 NOTE — Progress Notes (Signed)
EEG completed; results pending.    

## 2015-08-07 NOTE — Discharge Summary (Signed)
Physician Discharge Summary   Patient ID: Lisa Anderson MRN: [id number] DOB/AGE: [birth year] 57 y.o.  Admit date: 08/05/2015 Discharge date: 08/07/2015  Primary Care Physician:  No primary care provider on file.  Discharge Diagnoses:    . Stroke due to embolism of right posterior cerebral artery ([place]) . Accelerated HTN . Alcohol abuse . Medical Non compliance . Diabetes Mellitus  . Depression  Consults:   Neurology, Dr Erlinda Hong Cardiology, Dr Radford Pax and Dr Thompson Grayer   Recommendations for Outpatient Follow-up:  Patient was strongly recommended medical compliance, had run out of all her antihypertensives   Patient was recommended to follow-up with PCP with her insurance network.   TESTS THAT NEED FOLLOW-UP CBC, BMET   DIET: carb modified     Allergies:   Allergies  Allergen Reactions  . Prednisone Hives and Other (See Comments)    Made her "crazy", suicidal  . Benicar [Olmesartan] Other (See Comments)    Did not work at all for patient  . Celexa [Citalopram] Other (See Comments)    Made patient feel crazy, fluoxetine is fine  . Effexor [Venlafaxine] Other (See Comments)    Made patient feel crazy  . Shrimp [Shellfish Allergy] Hives and Swelling     Discharge Medications:   Medication List    STOP taking these medications        metoprolol tartrate 25 MG tablet  Commonly known as:  LOPRESSOR      TAKE these medications        albuterol 108 (90 BASE) MCG/ACT inhaler  Commonly known as:  PROVENTIL HFA;VENTOLIN HFA  Inhale 2 puffs into the lungs every 6 (six) hours as needed for wheezing or shortness of breath. Shortness of breath     aspirin 325 MG tablet  Take 1 tablet (325 mg total) by mouth daily.     diltiazem 240 MG 24 hr capsule  Commonly known as:  DILACOR XR  Take 1 capsule (240 mg total) by mouth every morning.     DULoxetine 30 MG capsule  Commonly known as:  CYMBALTA  Take 1 capsule (30 mg total) by mouth daily.     FLUoxetine 20 MG  tablet  Commonly known as:  PROZAC  Take 1 tablet (20 mg total) by mouth daily.     glipiZIDE 5 MG tablet  Commonly known as:  GLUCOTROL  Take 1 tablet (5 mg total) by mouth 2 (two) times daily before a meal.     hydrALAZINE 50 MG tablet  Commonly known as:  APRESOLINE  Take 1 tablet (50 mg total) by mouth 3 (three) times daily.     hydrOXYzine 25 MG tablet  Commonly known as:  ATARAX/VISTARIL  Take 1 tablet (25 mg total) by mouth every 12 (twelve) hours as needed for anxiety.     ibuprofen 200 MG tablet  Commonly known as:  ADVIL,MOTRIN  Take 200 mg by mouth every 6 (six) hours as needed for headache.     insulin aspart 100 UNIT/ML injection  Commonly known as:  novoLOG  Sliding scale CBG 70 - 120: 0 units CBG 121 - 150: 1 unit,  CBG 151 - 200: 2 units,  CBG 201 - 250: 3 units,  CBG 251 - 300: 5 units,  CBG 301 - 350: 7 units,  CBG 351 - 400: 9 units   CBG > 400: 9 units and notify your MD     metFORMIN 1000 MG tablet  Commonly known as:  GLUCOPHAGE  Take 1 tablet (1,000  mg total) by mouth 2 (two) times daily with a meal.     ramelteon 8 MG tablet  Commonly known as:  ROZEREM  Take 1 tablet (8 mg total) by mouth at bedtime.     rosuvastatin 20 MG tablet  Commonly known as:  CRESTOR  Take 1 tablet (20 mg total) by mouth every evening.     triamterene-hydrochlorothiazide 37.5-25 MG tablet  Commonly known as:  MAXZIDE-25  Take 1 tablet by mouth daily.         Brief H and P: For complete details please refer to admission H and P, but in brief Patient is a 59 year old female with hypertension, hyperlipidemia, asthma, diabetes mellitus, noncompliant presented initially to [facility] ED on 11/19 with 5 day history of diplopia, moderate to severe right-sided headache and gait instability. Patient went to ophthalmologist and her vision was apparently found to be normal. However because of the persistent visual changes, patient was admitted by the ophthalmologist to come to ED  for stroke workup. CT scan was suggestive of stroke and MRI also confirmed a small right acute parieto-occipital CVA. Patient had run out of her BP medications in months, BP was 250/107 at the time of admission. Patient was transferred [facility] for further stroke workup.  Hospital Course:  Acute CVA Stroke [facility]): With underlying history of hypertension, hyperlipidemia, diabetes, severe noncompliance - MRI of the brain confirmed small right acute parieto-occipital CVA - MRA showed segmental occlusion or near complete occlusion of the proximal right M2 inferior division. 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 Patient was seen by Dr Rayann Heman and loop recorder was placed.  - PT is recommending no PT follow-up - EEG was abnormal due to sharp activity in the left temporal region suggestive of focal disturbance with epileptic potential. Neurology will follow up outpatient and per Dr Jaynee Eagles, no antiepileptics are indicated at this time.    Cocaine abuse: Prior history of cocaine abuse but UDS negative for cocaine this admission   Alcohol abuse - Alcohol level less than 5, patient reports not drinking  Uncontrolled hypertension/accelerated hypertension - Continue Cardizem, hydralazine, triamterene HCTZ today - Allergic to benicar, due to history of cocaine, will not start beta blocker    Diabetes mellitus - Hemoglobin A1c 9.9, continue sliding scale insulin, metformin and glipizide outpatient  Depression Continue Cymbalta  Day of Discharge BP 137/65 mmHg  Pulse 74  Temp(Src) 98.3 F (36.8 C) (Oral)  Resp 20  SpO2 97%  Physical Exam: General: Alert and awake oriented x3 not in any acute distress. HEENT: anicteric sclera, pupils  reactive to light and accommodation CVS: S1-S2 clear no murmur rubs or gallops Chest: clear to auscultation bilaterally, no wheezing rales or rhonchi Abdomen: soft nontender, nondistended, normal bowel sounds Extremities: no cyanosis, clubbing or edema noted bilaterally Neuro: Cranial nerves II-XII intact, no focal neurological deficits   The results of significant diagnostics from this hospitalization (including imaging, microbiology, ancillary and laboratory) are listed below for reference.    LAB RESULTS: Basic Metabolic Panel:  Recent Labs Lab 08/05/15 1105 08/05/15 1114 08/05/15 1957 08/07/15 0606  NA 138 141  --  140  K 3.4* 3.5  --  3.3*  CL 100* 98*  --  102  CO2 31  --   --  27  GLUCOSE 166* 165*  --  137*  BUN 12 12  --  8  CREATININE 0.90 0.90 0.81 0.81  CALCIUM 9.1  --   --  9.2   Liver Function Tests:  Recent Labs Lab 08/05/15 1105  AST 16  ALT 15  ALKPHOS 113  BILITOT 0.7  PROT 8.2*  ALBUMIN 4.3   No results for input(s): LIPASE, AMYLASE in the last 168 hours. No results for input(s): AMMONIA in the last 168 hours. CBC:  Recent Labs Lab 08/05/15 1105  08/05/15 1957 08/07/15 0606  WBC 9.1  --  8.4 8.9  NEUTROABS 4.7  --   --   --   HGB 14.1  < > 12.7 13.7  HCT 42.7  < > 39.2 42.7  MCV 85.2  --  85.2 85.2  PLT 295  --  284 319  < > = values in this interval not displayed. Cardiac Enzymes: No results for input(s): CKTOTAL, CKMB, CKMBINDEX, TROPONINI in the last 168 hours. BNP: Invalid input(s): POCBNP CBG:  Recent Labs Lab 08/07/15 0642 08/07/15 1145  GLUCAP 154* 115*    Significant Diagnostic Studies:  No results found.  2D ECHO: Study Conclusions  - Left ventricle: The cavity size was normal. Wall thickness was normal. Systolic function was normal. The estimated ejection fraction was in the range of 55% to 60%. Wall motion was normal; there were no regional wall motion abnormalities. Doppler parameters are consistent  with abnormal left ventricular relaxation (grade 1 diastolic dysfunction). - Left atrium: The atrium was mildly dilated. - Atrial septum: No defect or patent foramen ovale was identified.  Disposition and Follow-up:     Discharge Instructions    Diet Carb Modified    Complete by:  As directed      Increase activity slowly    Complete by:  As directed             DISPOSITION: home    DISCHARGE FOLLOW-UP Follow-up Information    Follow up with Melvenia Beam, MD. Schedule an appointment as soon as possible for a visit in 4 weeks.   Specialty:  Neurology   Why:  for hospital follow-up   Contact information:   [address] [place] [address] [phone]       Follow up with [facility]. Schedule an appointment as soon as possible for a visit in 10 days.   Why:  for hospital follow-up. Please call on Monday for hospital follow-up.    Contact information:   [address] [place] [phone]      Follow up with Thompson Grayer, MD. Schedule an appointment as soon as possible for a visit in 1 week.   Specialty:  Cardiology   Why:  for hospital follow-up. Please call on Monday for appt for loop recorder check in 1 week.    Contact information:   [place] [address] [phone]        Time spent on Discharge: 35 mins  Signed:   Julionna Marczak M.D. [facility] 08/07/2015, 3:57 PM Pager: [phone]

## 2015-08-07 NOTE — CV Procedure (Signed)
    PROCEDURE NOTE:  Procedure:  Transesophageal echocardiogram Operator:  Fransico Him, MD Indications:  CVA Complications: None IV Meds:Versed 6mg , Fentanyl 62.5mg  IV  Results: Normal LV size and function Normal RV size and function Normal RA Normal LA and LA appendage Normal TV with trivial TR Normal PV Normal MV with trivial MR Normal trileaflet AV Normal interatrial septum with no evidence of shunt by colorflow dopper or agitated saline contrast Normal thoracic and ascending aorta.  No obvious source of embolism.  The patient tolerated the procedure well and was transferred back to their room in stable condition.  Signed: Fransico Him, MD [facility]

## 2015-08-07 NOTE — Consult Note (Addendum)
ELECTROPHYSIOLOGY CONSULT NOTE  Patient ID: Lisa Anderson MRN: [id number], DOB/AGE: [birth year]   Admit date: 08/05/2015 Date of Consult: 08/07/2015  Primary Physician: No primary care provider on file. Primary Cardiologist: None Reason for Consultation: Cryptogenic stroke -  recommendations regarding Implantable Loop Recorder  History of Present Illness Lisa Anderson was admitted on 08/05/2015 with at the recommendation of her opthomologist with visual c/o both double vision and blurred vision, she did not percieve one or the other but felt like both eyes were affected, she was also having headaches.  They first developed symptoms while on/off for a few days prior to being admitted.  Imaging demonstrated R parieto-occipital cortical infarct embolic secondary to unknown source  .  she has undergone workup for stroke including echocardiogram and extensive imaging.  The patient has been monitored on telemetry which has demonstrated sinus rhythm with no arrhythmias.  Inpatient stroke work-up is to be completed with a TEE this morning. She feels well, no active c/o, no ongoing visual disturbances, a slight headache but much improved.  Echocardiogram this admission demonstrated Study Conclusions - Left ventricle: The cavity size was normal. Wall thickness was normal. Systolic function was normal. The estimated ejection fraction was in the range of 55% to 60%. Wall motion was normal; there were no regional wall motion abnormalities. Doppler parameters are consistent with abnormal left ventricular relaxation (grade 1 diastolic dysfunction). - Left atrium: The atrium was mildly dilated. - Atrial septum: No defect or patent foramen ovale was identified.  Ct Angio Neck W/cm &/or Wo/cm 08/06/2015 No significant carotid or vertebral artery stenosis in the neck.    Lab work is reviewed.  Prior to admission, the patient denies chest pain, shortness of breath, dizziness, or syncope.   They are recovering from their stroke with plans to home at discharge.  She reports longstanding history of palpitations, she has been seen about 10 years ago by a cardiologist, unknown name and worn holter without an abnormal findings that she was told, was treated with lopressor back then without improvement in the palpitations, and then cardizem with less but not resolved.    She is stated to be non-compliant with her medicines, she states this is secondary to financial constraints, but working now and states she will be able to manage her medicines.  She smokes and is counseled on this, seem motivated to quit, she reports she has not used Crack cocaine or any drugs in 8 months and denies ETOH use.   She denies any bleeding history, has never been on blood thinners, and denies any hematological history  EP has been asked to evaluate for placement of an implantable loop recorder to monitor for atrial fibrillation.     Past Medical History  Diagnosis Date  . Hypertension   . Diabetes mellitus   . Hyperlipidemia   . Asthma   . Cancer [facility])      Surgical History:  Past Surgical History  Procedure Laterality Date  . Tonsillectomy    . Breast lumpectomy       Prescriptions prior to admission  Medication Sig Dispense Refill Last Dose  . albuterol (PROVENTIL HFA;VENTOLIN HFA) 108 (90 BASE) MCG/ACT inhaler Inhale 2 puffs into the lungs every 6 (six) hours as needed for wheezing or shortness of breath. Shortness of breath   Past Week at Unknown time  . FLUoxetine (PROZAC) 20 MG tablet Take 20 mg by mouth daily.   08/05/2015 at Unknown time  . hydrOXYzine (ATARAX/VISTARIL) 25 MG tablet Take  1 tablet (25 mg total) by mouth every 6 (six) hours as needed for anxiety (or CIWA score </= 10). (Patient taking differently: Take 50 mg by mouth at bedtime. ) 30 tablet 0 Past Week at Unknown time  . ibuprofen (ADVIL,MOTRIN) 200 MG tablet Take 200 mg by mouth every 6 (six) hours as needed for headache.    Past Week at Unknown time  . insulin aspart (NOVOLOG) 100 UNIT/ML injection Inject 5-10 Units into the skin 3 (three) times daily. Sliding scale 1 vial 12 08/05/2015 at Unknown time  . metFORMIN (GLUCOPHAGE) 500 MG tablet Take 2 tablets (1,000 mg total) by mouth 2 (two) times daily with a meal. (Patient taking differently: Take 500 mg by mouth 2 (two) times daily with a meal. ) 120 tablet 0 08/05/2015 at Unknown time  . metFORMIN (GLUCOPHAGE) 500 MG tablet Take 500 mg by mouth 2 (two) times daily with a meal.   Past Week at Unknown time  . rosuvastatin (CRESTOR) 20 MG tablet Take 20 mg by mouth every evening.    08/04/2015 at Unknown time  . glipiZIDE (GLUCOTROL) 5 MG tablet Take 5 mg by mouth 2 (two) times daily before a meal.   Not Taking at Unknown time  . hydrALAZINE (APRESOLINE) 50 MG tablet Take 50 mg by mouth 3 (three) times daily.   Not Taking at Unknown time  . metoprolol tartrate (LOPRESSOR) 25 MG tablet Take 25 mg by mouth 2 (two) times daily.   Not Taking at Unknown time  . triamterene-hydrochlorothiazide (MAXZIDE-25) 37.5-25 MG tablet Take 1 tablet by mouth daily.   Not Taking at Unknown time    Inpatient Medications:  .  stroke: mapping our early stages of recovery book   Does not apply Once  . aspirin  300 mg Rectal Daily   Or  . aspirin  325 mg Oral Daily  . diltiazem  240 mg Oral q morning - 10a  . DULoxetine  30 mg Oral Daily  . enoxaparin (LOVENOX) injection  40 mg Subcutaneous Q24H  . FLUoxetine  20 mg Oral Daily  . insulin aspart  0-5 Units Subcutaneous QHS  . insulin aspart  0-9 Units Subcutaneous TID WC  . ramelteon  8 mg Oral QHS  . rosuvastatin  20 mg Oral q1800  . triamterene-hydrochlorothiazide  1 tablet Oral Daily    Allergies:  Allergies  Allergen Reactions  . Prednisone Hives and Other (See Comments)    Made her "crazy", suicidal  . Benicar [Olmesartan] Other (See Comments)    Did not work at all for patient  . Celexa [Citalopram] Other (See Comments)     Made patient feel crazy, fluoxetine is fine  . Effexor [Venlafaxine] Other (See Comments)    Made patient feel crazy  . Shrimp [Shellfish Allergy] Hives and Swelling    Social History   Social History  . Marital Status: Single    Spouse Name: N/A  . Number of Children: N/A  . Years of Education: N/A   Occupational History  . Not on file.   Social History Main Topics  . Smoking status: Current Some Day Smoker  . Smokeless tobacco: Not on file  . Alcohol Use: 3.6 oz/week    6 Cans of beer per week  . Drug Use: Yes    Special: "Crack" cocaine  . Sexual Activity: No     Comment: recovering cocaine addict x6 months   Other Topics Concern  . Not on file   Social History Narrative  Family History  Problem Relation Age of Onset  . Cancer Mother   . Diabetes Mother   . Hyperlipidemia Mother   . Hypertension Mother   . Rheum arthritis Father   . Asthma Father   . Stroke Father   . Thyroid disease Father   . Thyroid disease Brother       Review of Systems: All other systems reviewed and are otherwise negative except as noted above.  Physical Exam: Filed Vitals:   08/06/15 1840 08/06/15 2152 08/07/15 0206 08/07/15 0638  BP: 148/62 163/82 156/85 148/89  Pulse: 76 71 72 74  Temp: 98.7 F (37.1 C) 98.3 F (36.8 C) 98.5 F (36.9 C) 98.2 F (36.8 C)  TempSrc: Oral Oral Oral Oral  Resp: 18 18 20 20   SpO2: 100% 99% 97% 99%    GEN- The patient is well appearing, alert and oriented x 3 today.   Head- normocephalic, atraumatic Eyes-  Sclera clear, conjunctiva pink Ears- hearing intact Oropharynx- clear Neck- supple Lungs- Clear to ausculation bilaterally, normal work of breathing Heart- Regular rate and rhythm, no murmurs, rubs or gallops  GI- soft, NT, ND, + BS Extremities- no clubbing, cyanosis, or edema MS- no significant deformity or atrophy Skin- no rash or lesion Psych- euthymic mood, full affect   Labs:   Lab Results  Component Value Date   WBC  8.9 08/07/2015   HGB 13.7 08/07/2015   HCT 42.7 08/07/2015   MCV 85.2 08/07/2015   PLT 319 08/07/2015    Recent Labs Lab 08/05/15 1105  08/07/15 0606  NA 138  < > 140  K 3.4*  < > 3.3*  CL 100*  < > 102  CO2 31  --  27  BUN 12  < > 8  CREATININE 0.90  < > 0.81  CALCIUM 9.1  --  9.2  PROT 8.2*  --   --   BILITOT 0.7  --   --   ALKPHOS 113  --   --   ALT 15  --   --   AST 16  --   --   GLUCOSE 166*  < > 137*  < > = values in this interval not displayed. Lab Results  Component Value Date   TROPONINI <0.30 10/08/2012   Lab Results  Component Value Date   CHOL 190 08/06/2015   Lab Results  Component Value Date   HDL 34* 08/06/2015   Lab Results  Component Value Date   LDLCALC 138* 08/06/2015   Lab Results  Component Value Date   TRIG 91 08/06/2015   Lab Results  Component Value Date   CHOLHDL 5.6 08/06/2015   No results found for: LDLDIRECT  Lab Results  Component Value Date   DDIMER 0.38 10/08/2012     Radiology/Studies:  Ct Head Wo Contrast 08/05/2015  CLINICAL DATA:  59 year old female with 5 day history of visual and proprioceptive problems. Weakness and headache. Predominantly right-sided headache with shooting pains to the left side. EXAM: CT HEAD WITHOUT CONTRAST TECHNIQUE: Contiguous axial images were obtained from the base of the skull through the vertex without intravenous contrast. COMPARISON:  Head CT 12/14/2012. FINDINGS: In the right parietal region there is low attenuation involving the cortex, best demonstrated on images 16-18 of series 2, suspicious for evolving ischemia (likely subacute), new compared to prior study 12/14/2012. Patchy areas of mild decreased attenuation are noted throughout the deep and periventricular white matter of the cerebral hemispheres bilaterally, compatible with mild chronic microvascular ischemic  disease. No other acute intracranial abnormalities. Specifically, no evidence of acute intracranial hemorrhage, no definite  mass, mass effect, hydrocephalus or abnormal intra or extra-axial fluid collections. Multifocal mucosal thickening throughout the ethmoid and maxillary sinuses bilaterally. Small air-fluid level in the right maxillary sinus. Mucous retention cyst or polyp in the anterior aspect of the left maxillary sinus measuring 1.4 cm in diameter incidentally noted. Mastoids are well pneumatized. No acute displaced skull fractures are identified. IMPRESSION: 1. Area of cortical low-attenuation the right parietal region, suspicious for evolving cortically based infarct. This could be confirmed with MRI of the brain with and without IV gadolinium. 2. Very mild chronic microvascular ischemic changes in the cerebral white matter, as above. 3. Paranasal sinus disease, as above, including a small air-fluid level in the right maxillary sinus, which could indicate acute sinusitis. Critical Value/emergent results were called by telephone at the time of interpretation on 08/05/2015 at 12:02 pm to Dr. Deno Etienne , who verbally acknowledged these results. Electronically Signed   By: Vinnie Langton M.D.   On: 08/05/2015 12:04   Ct Angio Neck W/cm &/or Wo/cm 08/06/2015  CLINICAL DATA:  Right parietal stroke. EXAM: CT ANGIOGRAPHY NECK TECHNIQUE: Multidetector CT imaging of the neck was performed using the standard protocol during bolus administration of intravenous contrast. Multiplanar CT image reconstructions and MIPs were obtained to evaluate the vascular anatomy. Carotid stenosis measurements (when applicable) are obtained utilizing NASCET criteria, using the distal internal carotid diameter as the denominator. CONTRAST:  164mL OMNIPAQUE IOHEXOL 350 MG/ML SOLN COMPARISON:  MRI and MRA head 08/05/2015 FINDINGS: Aortic arch: Normal aortic arch without stenosis or dissection. Proximal great vessels widely patent. Lung apices clear. Right carotid system: Right common carotid artery is normal. Very mild atherosclerotic plaque in the carotid  bulb without significant stenosis. No dissection Left carotid system: Left common carotid artery widely patent. Minimal atherosclerotic plaque in the carotid bulb without stenosis or dissection Vertebral arteries:Left vertebral artery dominant. Both vertebral arteries patent to the basilar without stenosis or dissection. Skeleton: Negative Other neck: Negative for mass or adenopathy in the neck. IMPRESSION: No significant carotid or vertebral artery stenosis in the neck. Electronically Signed   By: Franchot Gallo M.D.   On: 08/06/2015 11:42   Mr [name] Head Wo Contrast 08/05/2015  CLINICAL DATA:  Acute right parieto-occipital infarct on MRI earlier today. Visual field changes. EXAM: MRA HEAD WITHOUT CONTRAST TECHNIQUE: Angiographic images of the Circle of Willis were obtained using MRA technique without intravenous contrast. COMPARISON:  None. FINDINGS: Study is mildly motion degraded. The visualized distal vertebral arteries are patent with the left being mildly dominant. There is moderate distal right vertebral artery stenosis at the vertebrobasilar junction. Right PICA origin, bilateral AICA origins, and bilateral SCA origins are patent. Basilar artery is patent without stenosis. The there is a small left posterior communicating artery. PCAs are patent with mild branch vessel irregularity but no significant proximal stenosis. Internal carotid arteries are patent from skullbase to carotid termini. There is mild proximal left ICA cavernous segment stenosis. 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 The right A1 segment is dominant. No significant ACA stenosis is seen. There is mild narrowing of the proximal right M1 segment. The right MCA bifurcation is patent, however there is segmental occlusion or near complete occlusion over approximately a 5 mm length of the proximal right M2 inferior division trunk with flow identified distal to this. Left MCA is patent with mild branch vessel  irregularity but  no significant proximal stenosis. IMPRESSION: 1. Segmental occlusion or near complete occlusion of the proximal right M2 inferior division. 2. Mild right M1 stenosis. 3. Moderate distal right vertebral artery stenosis. 4. 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 Electronically Signed   By: Logan Bores M.D.   On: 08/05/2015 15:31   Mr Brain Wo Contrast 08/05/2015  ADDENDUM REPORT: 08/05/2015 15:35 ADDENDUM: The second sentence of the clinical data section should read "Decreased coordination." The patient returned later in the day to complete the remaining noncontrast brain MRI sequences. Additional findings as follows: There is mild right parieto-occipital cortical cytotoxic edema related to the acute infarct. No intracranial hemorrhage, mass, midline shift, or extra-axial fluid collection is seen. Small foci of T2 hyperintensity in the subcortical and deep cerebral white matter bilaterally and in the pons are nonspecific but compatible with mild chronic small vessel ischemic disease. Orbits are unremarkable. There is mild right maxillary sinus mucosal thickening with small volume fluid/secretions. Polypoid mucosal thickening is noted in the left sphenoid and left maxillary sinuses, and there is mild bilateral ethmoid air cell mucosal thickening. Trace bilateral mastoid effusions are noted. Major intracranial vascular flow voids are preserved. Electronically Signed   By: Logan Bores M.D.   On: 08/05/2015 15:35  08/05/2015  CLINICAL DATA:  Stroke-like symptoms. Decreased cord nasion. History of hypertension, diabetes, and hyperlipidemia. EXAM: MRI HEAD WITHOUT CONTRAST TECHNIQUE: Multiplanar, multiecho pulse sequences of the brain and surrounding structures were obtained without intravenous contrast. COMPARISON:  Head CT 08/05/2015 FINDINGS: The examination had to be discontinued prior to completion due to patient claustrophobia. Axial and coronal diffusion and sagittal T1  weighted images were obtained. There is a small region of acute cortical and subcortical infarction in the posterior right MCA territory involving the right parietal and occipital lobes corresponding to the low-density on CT earlier today. There is no evidence of gross hemorrhage, mass, or extra-axial fluid collection on this incomplete study. Ventricles and sulci are within normal limits in size for age. There is no midline shift. IMPRESSION: Incomplete examination. Small, acute right parieto-occipital infarct. Electronically Signed: By: Logan Bores M.D. On: 08/05/2015 12:44    12-lead ECG is SR All prior EKG's in EPIC reviewed with no documented atrial fibrillation  Telemetry SR only  Assessment and Plan:  1. Cryptogenic stroke The patient presents with cryptogenic stroke.  The patient has a TEE planned for this AM.  I spoke at length with the patient about monitoring for afib with either a 30 day event monitor or an implantable loop recorder.  Risks, benefits, and alteratives to implantable loop recorder were discussed with the patient today.   At this time, the patient is very clear in their decision to proceed with implantable loop recorder.   Further pending TEE results    Please call with questions.   Renee Dyane Dustman, PA-C 08/07/2015    I have seen, examined the patient, and reviewed the above assessment and plan.  On exam, RRR.  Changes to above are made where necessary.  Pt with longstanding palpitations of unclear cause despite prior ambulatory monitoring.  Now with cryptogenic stroke.  No documented afib.  BP is quite high.  Primary team will need to manage this aggressively.  Pt with aneurysm on MRI.  Neurology to address with patient.  Importance of cocaine avoidance discussed at length with patient.  Will proceed with ILR as TEE has been unrevealing.  Co Sign: Thompson Grayer, MD 08/07/2015 1:52 PM

## 2015-08-07 NOTE — Interval H&P Note (Signed)
History and Physical Interval Note:  08/07/2015 10:40 AM  Lisa Anderson  has presented today for surgery, with the diagnosis of stroke  The various methods of treatment have been discussed with the patient and family. After consideration of risks, benefits and other options for treatment, the patient has consented to  Procedure(s): TRANSESOPHAGEAL ECHOCARDIOGRAM (TEE) (N/A) as a surgical intervention .  The patient's history has been reviewed, patient examined, no change in status, stable for surgery.  I have reviewed the patient's chart and labs.  Questions were answered to the patient's satisfaction.     Therin Vetsch R

## 2015-08-07 NOTE — Interval H&P Note (Signed)
History and Physical Interval Note:  08/07/2015 8:12 AM  Lisa Anderson  has presented today for surgery, with the diagnosis of stroke  The various methods of treatment have been discussed with the patient and family. After consideration of risks, benefits and other options for treatment, the patient has consented to  Procedure(s): TRANSESOPHAGEAL ECHOCARDIOGRAM (TEE) (N/A) as a surgical intervention .  The patient's history has been reviewed, patient examined, no change in status, stable for surgery.  I have reviewed the patient's chart and labs.  Questions were answered to the patient's satisfaction.     TURNER,TRACI R

## 2015-08-07 NOTE — Progress Notes (Signed)
Discharge orders received, Pt for discharge home. IV d/c'd. D/c instructions and RX given with verbalized understanding. Family at bedside to assist patient with discharge. Staff bought pt downstairs via wheelchair. 08/07/15 1628

## 2015-08-07 NOTE — Procedures (Signed)
ELECTROENCEPHALOGRAM REPORT   Patient: Lisa Anderson       Room #: [id number] EEG No. ID: [id number] Age: 59 y.o.        Sex: female Referring Physician: Erlinda Hong Report Date:  08/07/2015        Interpreting Physician: Alexis Goodell  History: [name] is an 60 y.o. female presenting with vision changes and acute right parietal infarct on MRI  Medications:  Scheduled: .  stroke: mapping our early stages of recovery book   Does not apply Once  . aspirin  300 mg Rectal Daily   Or  . aspirin  325 mg Oral Daily  . diltiazem  240 mg Oral q morning - 10a  . DULoxetine  30 mg Oral Daily  . enoxaparin (LOVENOX) injection  40 mg Subcutaneous Q24H  . FLUoxetine  20 mg Oral Daily  . insulin aspart  0-5 Units Subcutaneous QHS  . insulin aspart  0-9 Units Subcutaneous TID WC  . ramelteon  8 mg Oral QHS  . rosuvastatin  20 mg Oral q1800  . triamterene-hydrochlorothiazide  1 tablet Oral Daily    Conditions of Recording:  This is a 16 channel EEG carried out with the patient in the awake and drowsy states.  Description:  The waking background activity consists of a low voltage, symmetrical, fairly well organized, 10 Hz alpha activity, seen from the parieto-occipital and posterior temporal regions.  Low voltage fast activity, poorly organized, is seen anteriorly and is at times superimposed on more posterior regions.  A mixture of theta and alpha rhythms are seen from the central and temporal regions.  This activity as of increased amplitude over the left temporal region.  Also noted in the left temporal region were frequent sharp transients.   The patient drowses with slowing to irregular, low voltage theta and beta activity with continued increased amplitude in the left temporal region and continued left temporal sharp transients.   Stage II sleep is not obtained. Hyperventilation and intermittent photic stimulation were not performed.   IMPRESSION: This is an abnormal electroencephalogram due to  sharp activity noted in the left temporal region.  This is suggestive of a focal disturbance with epileptogenic potential.     Alexis Goodell, MD [facility] [phone] 08/07/2015, 10:42 AM

## 2015-08-10 ENCOUNTER — Encounter (HOSPITAL_COMMUNITY): Payer: Self-pay | Admitting: Internal Medicine

## 2015-08-19 ENCOUNTER — Ambulatory Visit: Payer: PRIVATE HEALTH INSURANCE

## 2015-08-26 ENCOUNTER — Encounter: Payer: Self-pay | Admitting: Internal Medicine

## 2015-08-28 ENCOUNTER — Ambulatory Visit: Payer: Self-pay | Admitting: Neurology

## 2015-08-31 ENCOUNTER — Encounter: Payer: Self-pay | Admitting: Neurology

## 2015-09-02 ENCOUNTER — Encounter: Payer: Self-pay | Admitting: Family Medicine

## 2015-09-02 ENCOUNTER — Ambulatory Visit: Payer: PRIVATE HEALTH INSURANCE | Attending: Family Medicine | Admitting: Family Medicine

## 2015-09-02 VITALS — BP 146/68 | HR 101 | Temp 99.1°F | Resp 18 | Ht 65.0 in | Wt 160.0 lb

## 2015-09-02 DIAGNOSIS — J208 Acute bronchitis due to other specified organisms: Secondary | ICD-10-CM | POA: Diagnosis not present

## 2015-09-02 DIAGNOSIS — F331 Major depressive disorder, recurrent, moderate: Secondary | ICD-10-CM | POA: Insufficient documentation

## 2015-09-02 DIAGNOSIS — H532 Diplopia: Secondary | ICD-10-CM | POA: Diagnosis not present

## 2015-09-02 DIAGNOSIS — I63011 Cerebral infarction due to thrombosis of right vertebral artery: Secondary | ICD-10-CM | POA: Diagnosis not present

## 2015-09-02 DIAGNOSIS — R531 Weakness: Secondary | ICD-10-CM | POA: Insufficient documentation

## 2015-09-02 DIAGNOSIS — F329 Major depressive disorder, single episode, unspecified: Secondary | ICD-10-CM | POA: Insufficient documentation

## 2015-09-02 DIAGNOSIS — I10 Essential (primary) hypertension: Secondary | ICD-10-CM | POA: Diagnosis not present

## 2015-09-02 DIAGNOSIS — E785 Hyperlipidemia, unspecified: Secondary | ICD-10-CM | POA: Insufficient documentation

## 2015-09-02 DIAGNOSIS — Z79899 Other long term (current) drug therapy: Secondary | ICD-10-CM | POA: Insufficient documentation

## 2015-09-02 DIAGNOSIS — E118 Type 2 diabetes mellitus with unspecified complications: Secondary | ICD-10-CM | POA: Diagnosis not present

## 2015-09-02 DIAGNOSIS — Z9114 Patient's other noncompliance with medication regimen: Secondary | ICD-10-CM | POA: Diagnosis not present

## 2015-09-02 DIAGNOSIS — Z794 Long term (current) use of insulin: Secondary | ICD-10-CM | POA: Diagnosis not present

## 2015-09-02 DIAGNOSIS — Z7984 Long term (current) use of oral hypoglycemic drugs: Secondary | ICD-10-CM | POA: Insufficient documentation

## 2015-09-02 DIAGNOSIS — Z7982 Long term (current) use of aspirin: Secondary | ICD-10-CM | POA: Diagnosis not present

## 2015-09-02 LAB — POCT GLYCOSYLATED HEMOGLOBIN (HGB A1C): HEMOGLOBIN A1C: 10.9

## 2015-09-02 LAB — GLUCOSE, POCT (MANUAL RESULT ENTRY): POC Glucose: 302 mg/dl — AB (ref 70–99)

## 2015-09-02 MED ORDER — DULOXETINE HCL 30 MG PO CPEP: 30.0000 mg | ORAL_CAPSULE | Freq: Every day | ORAL | Status: DC

## 2015-09-02 MED ORDER — ALBUTEROL SULFATE HFA 108 (90 BASE) MCG/ACT IN AERS: 2.0000 | INHALATION_SPRAY | Freq: Four times a day (QID) | RESPIRATORY_TRACT | Status: DC | PRN

## 2015-09-02 MED ORDER — AZITHROMYCIN 250 MG PO TABS: ORAL_TABLET | ORAL | Status: DC

## 2015-09-02 NOTE — Patient Instructions (Signed)
Diabetes Mellitus and Food It is important for you to manage your blood sugar (glucose) level. Your blood glucose level can be greatly affected by what you eat. Eating healthier foods in the appropriate amounts throughout the day at about the same time each day will help you control your blood glucose level. It can also help slow or prevent worsening of your diabetes mellitus. Healthy eating may even help you improve the level of your blood pressure and reach or maintain a healthy weight.  General recommendations for healthful eating and cooking habits include:  Eating meals and snacks regularly. Avoid going long periods of time without eating to lose weight.  Eating a diet that consists mainly of plant-based foods, such as fruits, vegetables, nuts, legumes, and whole grains.  Using low-heat cooking methods, such as baking, instead of high-heat cooking methods, such as deep frying. Work with your dietitian to make sure you understand how to use the Nutrition Facts information on food labels. HOW CAN FOOD AFFECT ME? Carbohydrates Carbohydrates affect your blood glucose level more than any other type of food. Your dietitian will help you determine how many carbohydrates to eat at each meal and teach you how to count carbohydrates. Counting carbohydrates is important to keep your blood glucose at a healthy level, especially if you are using insulin or taking certain medicines for diabetes mellitus. Alcohol Alcohol can cause sudden decreases in blood glucose (hypoglycemia), especially if you use insulin or take certain medicines for diabetes mellitus. Hypoglycemia can be a life-threatening condition. Symptoms of hypoglycemia (sleepiness, dizziness, and disorientation) are similar to symptoms of having too much alcohol.  If your health care provider has given you approval to drink alcohol, do so in moderation and use the following guidelines:  Women should not have more than one drink per day, and men  should not have more than two drinks per day. One drink is equal to:  12 oz of beer.  5 oz of wine.  1 oz of hard liquor.  Do not drink on an empty stomach.  Keep yourself hydrated. Have water, diet soda, or unsweetened iced tea.  Regular soda, juice, and other mixers might contain a lot of carbohydrates and should be counted. WHAT FOODS ARE NOT RECOMMENDED? As you make food choices, it is important to remember that all foods are not the same. Some foods have fewer nutrients per serving than other foods, even though they might have the same number of calories or carbohydrates. It is difficult to get your body what it needs when you eat foods with fewer nutrients. Examples of foods that you should avoid that are high in calories and carbohydrates but low in nutrients include:  Trans fats (most processed foods list trans fats on the Nutrition Facts label).  Regular soda.  Juice.  Candy.  Sweets, such as cake, pie, doughnuts, and cookies.  Fried foods. WHAT FOODS CAN I EAT? Eat nutrient-rich foods, which will nourish your body and keep you healthy. The food you should eat also will depend on several factors, including:  The calories you need.  The medicines you take.  Your weight.  Your blood glucose level.  Your blood pressure level.  Your cholesterol level. You should eat a variety of foods, including:  Protein.  Lean cuts of meat.  Proteins low in saturated fats, such as fish, egg whites, and beans. Avoid processed meats.  Fruits and vegetables.  Fruits and vegetables that may help control blood glucose levels, such as apples, mangoes, and   yams.  Dairy products.  Choose fat-free or low-fat dairy products, such as milk, yogurt, and cheese.  Grains, bread, pasta, and rice.  Choose whole grain products, such as multigrain bread, whole oats, and brown rice. These foods may help control blood pressure.  Fats.  Foods containing healthful fats, such as nuts,  avocado, olive oil, canola oil, and fish. DOES EVERYONE WITH DIABETES MELLITUS HAVE THE SAME MEAL PLAN? Because every person with diabetes mellitus is different, there is not one meal plan that works for everyone. It is very important that you meet with a dietitian who will help you create a meal plan that is just right for you.   This information is not intended to replace advice given to you by your health care provider. Make sure you discuss any questions you have with your health care provider.   Document Released: 06/09/2005 Document Revised: 10/03/2014 Document Reviewed: 08/09/2013 Elsevier Interactive Patient Education 2016 [employer]  

## 2015-09-02 NOTE — Progress Notes (Signed)
Subjective:  Patient ID: Lisa Anderson, female    DOB: 1956-06-08  Age: 59 y.o. MRN: [id number]  CC: Hospitalization Follow-up   HPI Lisa Anderson is 59 year old female with a history of type 2 diabetes mellitus, hypertension, depression who was recently hospitalized at [facility] for a stroke from 08/05/15 through 08/07/15.  She was referred to the [facility] ED for stroke workup due to persisting diplopia;CT scan was suggestive of stroke and MRI also confirmed a small right acute parieto-occipital CVA. Patient had run out of her BP medications in months, BP was 250/107 at the time of admission and she had admitted to noncompliance with medications. Patient was transferred [facility] for further stroke workup. She was commenced on aspirin 325 mg daily. MRI of the brain confirmed small right acute parieto-occipital CVA;  MRA showed segmental occlusion or near complete occlusion of the proximal right M2 inferior division. 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 TEE was normal; she was seen by cardiology and a loop recorder was placed.  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 Neurology outpatient follow-up was recommended; no initiation of antiepileptics indicated as an inpatient.   Interval history: She reports missing her neurology and cardiology appointment due to transportation issues and states she will be calling those offices to reschedule. Today she complains of cough which is productive of yellowish sputum, rhinorrhea, myalgias for the last 2 weeks. She has also noticed some weight loss despite having a good appetite.  She denies chest pains or shortness of breath Outpatient Prescriptions Prior to Visit  Medication Sig Dispense Refill  . aspirin 325 MG tablet Take 1 tablet (325 mg total) by mouth daily. 30 tablet 3  . diltiazem  (DILACOR XR) 240 MG 24 hr capsule Take 1 capsule (240 mg total) by mouth every morning. 30 capsule 3  . glipiZIDE (GLUCOTROL) 5 MG tablet Take 1 tablet (5 mg total) by mouth 2 (two) times daily before a meal. 60 tablet 3  . hydrALAZINE (APRESOLINE) 50 MG tablet Take 1 tablet (50 mg total) by mouth 3 (three) times daily. 90 tablet 3  . hydrOXYzine (ATARAX/VISTARIL) 25 MG tablet Take 1 tablet (25 mg total) by mouth every 12 (twelve) hours as needed for anxiety. 30 tablet 0  . ibuprofen (ADVIL,MOTRIN) 200 MG tablet Take 200 mg by mouth every 6 (six) hours as needed for headache.    . insulin aspart (NOVOLOG) 100 UNIT/ML injection Sliding scale CBG 70 - 120: 0 units CBG 121 - 150: 1 unit,  CBG 151 - 200: 2 units,  CBG 201 - 250: 3 units,  CBG 251 - 300: 5 units,  CBG 301 - 350: 7 units,  CBG 351 - 400: 9 units   CBG > 400: 9 units and notify your MD 1 vial 12  . metFORMIN (GLUCOPHAGE) 1000 MG tablet Take 1 tablet (1,000 mg total) by mouth 2 (two) times daily with a meal. 60 tablet 3  . rosuvastatin (CRESTOR) 20 MG tablet Take 1 tablet (20 mg total) by mouth every evening. 30 tablet 3  . triamterene-hydrochlorothiazide (MAXZIDE-25) 37.5-25 MG tablet Take 1 tablet by mouth daily. 30 tablet 3  . albuterol (PROVENTIL HFA;VENTOLIN HFA) 108 (90 BASE) MCG/ACT inhaler Inhale 2 puffs into the lungs every 6 (six) hours as needed for wheezing or shortness of breath. Shortness of breath    .  DULoxetine (CYMBALTA) 30 MG capsule Take 1 capsule (30 mg total) by mouth daily. 30 capsule 0  . ramelteon (ROZEREM) 8 MG tablet Take 1 tablet (8 mg total) by mouth at bedtime. 30 tablet 0  . FLUoxetine (PROZAC) 20 MG tablet Take 1 tablet (20 mg total) by mouth daily. (Patient not taking: Reported on 09/02/2015) 30 tablet 0   No facility-administered medications prior to visit.    ROS Review of Systems  Constitutional: Positive for unexpected weight change. Negative for activity change, appetite change and fatigue.  HENT:  Positive for postnasal drip and rhinorrhea. Negative for congestion, sinus pressure and sore throat. Ear discharge: weight loss.   Eyes: Negative for visual disturbance.  Respiratory: Positive for cough. Negative for chest tightness, shortness of breath and wheezing.   Cardiovascular: Negative for chest pain and palpitations.  Gastrointestinal: Negative for abdominal pain, constipation and abdominal distention.  Endocrine: Negative for polydipsia.  Genitourinary: Negative for dysuria and frequency.  Musculoskeletal: Negative for back pain and arthralgias.  Skin: Negative for rash.  Neurological: Negative for tremors, light-headedness and numbness.  Hematological: Does not bruise/bleed easily.  Psychiatric/Behavioral: Negative for behavioral problems and agitation.    Objective:  BP 146/68 mmHg  Pulse 101  Temp(Src) 99.1 F (37.3 C) (Oral)  Resp 18  Ht 5\' 5"  (1.651 m)  Wt 160 lb (72.576 kg)  BMI 26.63 kg/m2  SpO2 94%  BP/Weight 09/02/2015 08/07/2015 [id number]  Systolic BP 123456 [id number]  Diastolic BP 68 65 79  Wt. (Lbs) 160 - -  BMI 26.63 - -  Some encounter information is confidential and restricted. Go to Review Flowsheets activity to see all data.      Physical Exam  Constitutional: She is oriented to person, place, and time. She appears well-developed and well-nourished. No distress.  HENT:  Head: Normocephalic.  Right Ear: External ear normal.  Left Ear: External ear normal.  Nose: Nose normal.  Mouth/Throat: Oropharynx is clear and moist.  Eyes: Conjunctivae and EOM are normal. Pupils are equal, round, and reactive to light.  Neck: Normal range of motion. No JVD present.  Cardiovascular: Regular rhythm, normal heart sounds and intact distal pulses.  Tachycardia present.  Exam reveals no gallop.   No murmur heard. Pulmonary/Chest: Effort normal. No respiratory distress. She has wheezes. She has no rales. She exhibits no tenderness.  Abdominal: Soft. Bowel sounds are  normal. She exhibits no distension and no mass. There is no tenderness.  Musculoskeletal: Normal range of motion. She exhibits no edema or tenderness.  Neurological: She is alert and oriented to person, place, and time. She has normal reflexes.  Skin: Skin is warm and dry. She is not diaphoretic.  Psychiatric: She has a normal mood and affect.   CLINICAL DATA: Acute right parieto-occipital infarct on MRI earlier today. Visual field changes.  EXAM: MRA HEAD WITHOUT CONTRAST  TECHNIQUE: Angiographic images of the Circle of Willis were obtained using MRA technique without intravenous contrast.  COMPARISON: None.  FINDINGS: Study is mildly motion degraded.  The visualized distal vertebral arteries are patent with the left being mildly dominant. There is moderate distal right vertebral artery stenosis at the vertebrobasilar junction. Right PICA origin, bilateral AICA origins, and bilateral SCA origins are patent. Basilar artery is patent without stenosis. The there is a small left posterior communicating artery. PCAs are patent with mild branch vessel irregularity but no significant proximal stenosis.  Internal carotid arteries are patent from skullbase to carotid termini. There is mild proximal left ICA  cavernous segment stenosis. 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 The right A1 segment is dominant. No significant ACA stenosis is seen. There is mild narrowing of the proximal right M1 segment. The right MCA bifurcation is patent, however there is segmental occlusion or near complete occlusion over approximately a 5 mm length of the proximal right M2 inferior division trunk with flow identified distal to this. Left MCA is patent with mild branch vessel irregularity but no significant proximal stenosis.  IMPRESSION: 1. Segmental occlusion or near complete occlusion of the proximal right M2 inferior division. 2. Mild right M1  stenosis. 3. Moderate distal right vertebral artery stenosis. 4. 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   Electronically Signed  By: Logan Bores M.D.  On: 08/05/2015 15:31   Assessment & Plan:   1. Type 2 diabetes mellitus with complication, with long-term current use of insulin (HCC) Uncontrolled with A1c of 10.9. She admits to noncompliance with medications which could be largely contributory compliance has been emphasized. - Glucose (CBG) - POCT A1C - Microalbumin/Creatinine Ratio, Urine  2. Moderate episode of recurrent major depressive disorder (HCC) Controlled on Cymbalta  3. Essential hypertension Mild diastolic elevation. No regimen changes at this time. Continue low-sodium, DASH diet  4. 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 Continue aggressive risk factor modification  5. HLD (hyperlipidemia) Remains on statin  6. Acute bronchitis due to other specified organisms Placed on Z-Pak   Meds ordered this encounter  Medications  . DULoxetine (CYMBALTA) 30 MG capsule    Sig: Take 1 capsule (30 mg total) by mouth daily.    Dispense:  30 capsule    Refill:  0  . albuterol (PROVENTIL HFA;VENTOLIN HFA) 108 (90 BASE) MCG/ACT inhaler    Sig: Inhale 2 puffs into the lungs every 6 (six) hours as needed for wheezing or shortness of breath. Shortness of breath    Dispense:  1 Inhaler    Refill:  2  . azithromycin (ZITHROMAX) 250 MG tablet    Sig: Take 2 tablets (500 mg) on day 1 then 1 tablet (250 mg) on days 2-5    Dispense:  6 tablet    Refill:  0    Follow-up: Return in about 2 weeks (around 09/16/2015) for follow up of diabetes mellitus.   Arnoldo Morale MD

## 2015-09-02 NOTE — Progress Notes (Signed)
Pt's here for stroke and multiple issues. Patients states she feeling tired x2 days with loss of weigh.  Patient requesting medication refills  Patient reports taking meds this morning.

## 2015-09-03 LAB — MICROALBUMIN / CREATININE URINE RATIO
CREATININE, URINE: 132 mg/dL (ref 20–320)
MICROALB UR: 1.1 mg/dL
MICROALB/CREAT RATIO: 8 ug/mg{creat} (ref <30)

## 2015-09-07 ENCOUNTER — Ambulatory Visit (INDEPENDENT_AMBULATORY_CARE_PROVIDER_SITE_OTHER): Payer: PRIVATE HEALTH INSURANCE | Admitting: *Deleted

## 2015-09-07 DIAGNOSIS — I63011 Cerebral infarction due to thrombosis of right vertebral artery: Secondary | ICD-10-CM

## 2015-09-09 NOTE — Progress Notes (Signed)
[facility] Summary Report / Loop Recorder 

## 2015-09-10 ENCOUNTER — Ambulatory Visit: Payer: PRIVATE HEALTH INSURANCE

## 2015-09-16 ENCOUNTER — Ambulatory Visit: Payer: Self-pay | Admitting: Family Medicine

## 2015-09-16 ENCOUNTER — Encounter: Payer: Self-pay | Admitting: Neurology

## 2015-09-16 ENCOUNTER — Ambulatory Visit (INDEPENDENT_AMBULATORY_CARE_PROVIDER_SITE_OTHER): Payer: PRIVATE HEALTH INSURANCE | Admitting: Neurology

## 2015-09-16 VITALS — BP 150/79 | HR 103 | Ht 65.0 in | Wt 163.6 lb

## 2015-09-16 DIAGNOSIS — I63511 Cerebral infarction due to unspecified occlusion or stenosis of right middle cerebral artery: Secondary | ICD-10-CM | POA: Diagnosis not present

## 2015-09-16 DIAGNOSIS — I63419 Cerebral infarction due to embolism of unspecified middle cerebral artery: Secondary | ICD-10-CM | POA: Diagnosis not present

## 2015-09-16 NOTE — Progress Notes (Signed)
[facility]    Provider:  Dr Jaynee Eagles Referring Provider: No ref. provider found Primary Care Physician:  No primary care provider on file.  CC:  stroke  HPI:  Lisa Anderson is a 59 y.o. 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 Lisa Anderson is a 59 y.o. female with history of hypertension, hyperlipidemia, asthma, diabetes mellitus, presented initially to [facility] ED with 5 day history of diplopia, moderate to severe right-sided headache and gait instability. She did not receive IV t-PA due to delay in arrival.  Interval History 09/16/2015: She is getting better. Symptoms are improving. Headache is mostly gone but she still is having some pressure maybe due to sinus pain. Double vision resolved, walking is better, no weakness. No more wavy lines. She has cut back on smoking and is trying to quit. No altered mentation or seizure activity. She goes to the cardiologist tomorrow to see about the loop implantor.She is on crestor. Taking ASA 325 every day. Blood pressure is a little high today, she endorses compliance with her medications. I offered to increase her meds, she says that she is seeing Dr. Joylene Grapes tomorrow, she is going to speak with him about that as he manages her blood pressure.   Reviewed notes, labs and imaging from outside physicians, which showed:  IMAGING  Ct Head Wo Contrast 08/05/2015 1. Area of cortical low-attenuation the right parietal region, suspicious for evolving cortically based infarct. This could be confirmed with MRI of the brain with and without IV gadolinium. 2. Very mild chronic microvascular ischemic changes in the cerebral white matter, as above. 3. Paranasal sinus disease, as above, including a small air-fluid level in the right maxillary sinus, which could indicate acute sinusitis.   Ct Angio Neck W/cm &/or Wo/cm 08/06/2015 No significant carotid or vertebral artery  stenosis in the neck.   Mr [name] Head Wo Contrast 08/05/2015 1. Segmental occlusion or near complete occlusion of the proximal right M2 inferior division. 2. Mild right M1 stenosis. 3. Moderate distal right vertebral artery stenosis. 4. 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   Mr Brain Wo Contrast 08/05/2015 There is mild right parieto-occipital cortical cytotoxic edema related to the acute infarct. No intracranial hemorrhage, mass, midline shift, or extra-axial fluid collection is seen. Small foci of T2 hyperintensity in the subcortical and deep cerebral white matter bilaterally and in the pons are nonspecific but compatible with mild chronic small vessel ischemic disease. Orbits are unremarkable. There is mild right maxillary sinus mucosal thickening with small volume fluid/secretions. Polypoid mucosal thickening is noted in the left sphenoid and left maxillary sinuses, and there is mild bilateral ethmoid air cell mucosal thickening. Trace bilateral mastoid effusions are noted. Major intracranial vascular flow voids are preserved.   2D Echocardiogram  - Left ventricle: The cavity size was normal. Wall thickness wasnormal. Systolic function was normal. The estimated ejectionfraction was in the range of 55% to 60%. Wall motion was normal;there were no regional wall motion abnormalities. Dopplerparameters are consistent with abnormal left ventricularrelaxation (grade 1 diastolic dysfunction). - Left atrium: The atrium was mildly dilated. - Atrial septum: No defect or patent foramen ovale was identified.  EEG - This is an abnormal electroencephalogram due to sharp activity noted in the left temporal region. This is suggestive of a focal disturbance with epileptogenic potential.  TEE No obvious source of embolism. Normal LV size and function Normal RV size and function Normal RA Normal LA  and LA appendage Normal TV with trivial TR Normal PV Normal MV with trivial  MR Normal trileaflet AV Normal interatrial septum with no evidence of shunt by colorflow dopper or agitated saline contrast Normal thoracic and ascending aorta.  Review of Systems: Patient complains of symptoms per HPI as well as the following symptoms: no CP, no SOB. Pertinent negatives per HPI. All others negative.   Social History   Social History  . Marital Status: Single    Spouse Name: N/A  . Number of Children: 5  . Years of Education: 12   Occupational History  . CNA- [employer]    Social History Main Topics  . Smoking status: Current Some Day Smoker  . Smokeless tobacco: Not on file  . Alcohol Use: 3.6 oz/week    6 Cans of beer per week  . Drug Use: Yes    Special: "Crack" cocaine  . Sexual Activity: No     Comment: recovering cocaine addict x6 months   Other Topics Concern  . Not on file   Social History Narrative   Lives with daughter   Caffeine use: Drinks soda (2- 20 oz soda per day)    Family History  Problem Relation Age of Onset  . Cancer Mother   . Diabetes Mother   . Hyperlipidemia Mother   . Hypertension Mother   . Rheum arthritis Father   . Asthma Father   . Stroke Father   . Thyroid disease Father   . Thyroid disease Brother     Past Medical History  Diagnosis Date  . Hypertension   . Diabetes mellitus   . Hyperlipidemia   . Asthma   . Cancer [facility])     Past Surgical History  Procedure Laterality Date  . Tonsillectomy    . Breast lumpectomy    . Ep implantable device N/A 08/07/2015    Procedure: Loop Recorder Insertion;  Surgeon: Thompson Grayer, MD;  Location: [place] [facility];  Service: Cardiovascular;  Laterality: N/A;  . Tee without cardioversion N/A 08/07/2015    Procedure: TRANSESOPHAGEAL ECHOCARDIOGRAM (TEE);  Surgeon: Sueanne Margarita, MD;  Location: [facility] ENDOSCOPY;  Service: Cardiovascular;  Laterality: N/A;    Current Outpatient Prescriptions  Medication Sig Dispense Refill  . albuterol (PROVENTIL  HFA;VENTOLIN HFA) 108 (90 BASE) MCG/ACT inhaler Inhale 2 puffs into the lungs every 6 (six) hours as needed for wheezing or shortness of breath. Shortness of breath 1 Inhaler 2  . aspirin 325 MG tablet Take 1 tablet (325 mg total) by mouth daily. 30 tablet 3  . diltiazem (DILACOR XR) 240 MG 24 hr capsule Take 1 capsule (240 mg total) by mouth every morning. 30 capsule 3  . DULoxetine (CYMBALTA) 30 MG capsule Take 1 capsule (30 mg total) by mouth daily. 30 capsule 0  . glipiZIDE (GLUCOTROL) 5 MG tablet Take 1 tablet (5 mg total) by mouth 2 (two) times daily before a meal. 60 tablet 3  . hydrALAZINE (APRESOLINE) 50 MG tablet Take 1 tablet (50 mg total) by mouth 3 (three) times daily. 90 tablet 3  . ibuprofen (ADVIL,MOTRIN) 200 MG tablet Take 200 mg by mouth every 6 (six) hours as needed for headache.    . insulin aspart (NOVOLOG) 100 UNIT/ML injection Sliding scale CBG 70 - 120: 0 units CBG 121 - 150: 1 unit,  CBG 151 - 200: 2 units,  CBG 201 - 250: 3 units,  CBG 251 - 300: 5 units,  CBG 301 - 350: 7 units,  CBG 351 - 400: 9 units   CBG > 400: 9 units and notify your MD 1 vial 12  . metFORMIN (GLUCOPHAGE) 1000 MG tablet Take 1 tablet (1,000 mg total) by mouth 2 (two) times daily with a meal. 60 tablet 3  . rosuvastatin (CRESTOR) 20 MG tablet Take 1 tablet (20 mg total) by mouth every evening. 30 tablet 3  . triamterene-hydrochlorothiazide (MAXZIDE-25) 37.5-25 MG tablet Take 1 tablet by mouth daily. 30 tablet 3   No current facility-administered medications for this visit.    Allergies as of 09/16/2015 - Review Complete 09/16/2015  Allergen Reaction Noted  . Prednisone Hives and Other (See Comments) 08/05/2015  . Benicar [olmesartan] Other (See Comments) 08/06/2015  . Celexa [citalopram] Other (See Comments) 08/06/2015  . Effexor [venlafaxine] Other (See Comments) 08/06/2015  . Shrimp [shellfish allergy] Hives and Swelling 10/08/2012    Vitals: BP 150/79 mmHg  Pulse 103  Ht 5\' 5"  (1.651 m)   Wt 163 lb 9.6 oz (74.208 kg)  BMI 27.22 kg/m2 Last Weight:  Wt Readings from Last 1 Encounters:  09/16/15 163 lb 9.6 oz (74.208 kg)   Last Height:   Ht Readings from Last 1 Encounters:  09/16/15 5\' 5"  (1.651 m)    ASSESSMENT/PLAN Ms. Cleatus Rozeboom is a 59 y.o. female with history of hypertension, hyperlipidemia, asthma, diabetes mellitus, presented initially to [facility] ED with 5 day history of diplopia, moderate to severe right-sided headache and gait instability. She did not receive IV t-PA due to delay in arrival.   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   2D Echo unremarkable  CT angio neck unremarkable   EEG sharp activity noted in the left temporal region. a focal disturbance with epileptogenic potential. She denies any symptoms. Discussed with patient, she is to call if she has any seizure-like activity. Monitor clinically at this time.  TEE unremarkable  [facility]: Following up tomorrow for loop recorder, had it implanted  LDL 138 continue Crestor  HgbA1c 9.9 - needs close follow up with primary care  No antithrombotic prior to admission, now on aspirin 325 mg daily.  Patient counseled to be compliant with her antithrombotic medications  Ongoing aggressive stroke risk factor management\  Patient has a 10-15% risk of having another stroke over the next year, the highest risk is within 2 weeks of the most recent stroke/TIA (risk of having a stroke following a stroke or TIA is the same).  Ongoing risk factor control by Primary Care Physician  R PCom Aneurysm  Incidental finding  No tx indicated  Will repeat imaging in 6 months, CTA  Hypertension  Elevated today in the office, she endorses compliance with her medications. I offered to increase her  meds, she says that she is seeing Dr. Joylene Grapes tomorrow, she is going to speak with him about that as he manages her blood pressure. Recommend increasing anti-hypertensive, goal normo-tensive  Hyperlipidemia  Home meds: crestor 20, resumed in hospital  LDL 138, goal < 70  Continue statin   Diabetes  HgbA1c 9.9, goal < 7.0  Uncontrolled  Follow with pcp  Tobacco abuse  Current smoker  Smoking cessation counseling provided  Pt is trying to quit  Other Stroke Risk Factors  ETOH use (not drinking currently)  Hx crack cocaine use, UDS neg this admission  Family hx stroke (father)  Other Active Problems  depressioin   I had a long d/w patient about her recent stroke, risk for recurrent stroke/TIAs, personally independently reviewed imaging studies and stroke evaluation results and answered questions.Continue ASA for secondary stroke prevention and maintain strict control of hypertension with blood pressure goal below 130/90, diabetes with hemoglobin A1c goal below 6.5% and lipids with LDL cholesterol goal below 70 mg/dL. I also advised the patient to eat a healthy diet with plenty of whole grains, cereals, fruits and vegetables, exercise regularly and maintain ideal body weight .Followup in the future with me in 6 months or call earlier if necessary.  Sarina Ill, MD  [facility] [address] [place], [place] [address]  Phone [phone] Fax [phone]  A total of 30 minutes was spent face-to-face with this patient. Over half this time was spent on counseling patient on the stroke diagnosis and different diagnostic and therapeutic options available.

## 2015-09-16 NOTE — Patient Instructions (Signed)
Remember to drink plenty of fluid, eat healthy meals and do not skip any meals. Try to eat protein with a every meal and eat a healthy snack such as fruit or nuts in between meals. Try to keep a regular sleep-wake schedule and try to exercise daily, particularly in the form of walking, 20-30 minutes a day, if you can.   As far as your medications are concerned, I would like to suggest; Continue current medications  As far as diagnostic testing: Will repeat a CTA in 6 months  I would like to see you back in 6 months, sooner if we need to. Please call us with any interim questions, concerns, problems, updates or refill requests.   Our phone number is [phone]. We also have an after hours call service for urgent matters and there is a physician on-call for urgent questions. For any emergencies you know to call 911 or go to the nearest emergency room

## 2015-09-17 ENCOUNTER — Ambulatory Visit (INDEPENDENT_AMBULATORY_CARE_PROVIDER_SITE_OTHER): Payer: PRIVATE HEALTH INSURANCE | Admitting: *Deleted

## 2015-09-17 ENCOUNTER — Encounter: Payer: Self-pay | Admitting: Internal Medicine

## 2015-09-17 DIAGNOSIS — I63011 Cerebral infarction due to thrombosis of right vertebral artery: Secondary | ICD-10-CM

## 2015-09-17 LAB — CUP PACEART REMOTE DEVICE CHECK: MDC IDC SESS DTM: 20161222170720

## 2015-09-17 NOTE — Progress Notes (Signed)
Wound check in clinic s/p ILR implant. Wound well healed without redness or edema.  Pt with 0 tachy episodes; 0 brady episodes; 0 asystole; 0 symptom episodes; 0 AF episodes. Plan to follow up via [facility] and f/u w/ JA prn.

## 2015-10-06 ENCOUNTER — Ambulatory Visit (INDEPENDENT_AMBULATORY_CARE_PROVIDER_SITE_OTHER): Payer: PRIVATE HEALTH INSURANCE | Admitting: *Deleted

## 2015-10-06 DIAGNOSIS — I63011 Cerebral infarction due to thrombosis of right vertebral artery: Secondary | ICD-10-CM | POA: Diagnosis not present

## 2015-10-07 NOTE — Progress Notes (Signed)
[facility] Summary Report / Loop Recorder 

## 2015-10-24 LAB — CUP PACEART REMOTE DEVICE CHECK: Date Time Interrogation Session: 20161211193527

## 2015-11-01 ENCOUNTER — Emergency Department (HOSPITAL_COMMUNITY): Payer: PRIVATE HEALTH INSURANCE

## 2015-11-01 ENCOUNTER — Encounter (HOSPITAL_COMMUNITY): Payer: Self-pay

## 2015-11-01 ENCOUNTER — Observation Stay (HOSPITAL_COMMUNITY)
Admission: EM | Admit: 2015-11-01 | Discharge: 2015-11-02 | Disposition: A | Discharge status: Home / Self Care | Payer: PRIVATE HEALTH INSURANCE | Attending: Internal Medicine | Admitting: Internal Medicine

## 2015-11-01 DIAGNOSIS — R51 Headache: Secondary | ICD-10-CM | POA: Diagnosis not present

## 2015-11-01 DIAGNOSIS — R4781 Slurred speech: Secondary | ICD-10-CM | POA: Insufficient documentation

## 2015-11-01 DIAGNOSIS — F1020 Alcohol dependence, uncomplicated: Secondary | ICD-10-CM | POA: Insufficient documentation

## 2015-11-01 DIAGNOSIS — J45909 Unspecified asthma, uncomplicated: Secondary | ICD-10-CM | POA: Diagnosis not present

## 2015-11-01 DIAGNOSIS — Z794 Long term (current) use of insulin: Secondary | ICD-10-CM | POA: Insufficient documentation

## 2015-11-01 DIAGNOSIS — F17210 Nicotine dependence, cigarettes, uncomplicated: Secondary | ICD-10-CM | POA: Insufficient documentation

## 2015-11-01 DIAGNOSIS — G459 Transient cerebral ischemic attack, unspecified: Secondary | ICD-10-CM | POA: Diagnosis not present

## 2015-11-01 DIAGNOSIS — E1159 Type 2 diabetes mellitus with other circulatory complications: Secondary | ICD-10-CM | POA: Diagnosis not present

## 2015-11-01 DIAGNOSIS — F339 Major depressive disorder, recurrent, unspecified: Secondary | ICD-10-CM | POA: Diagnosis not present

## 2015-11-01 DIAGNOSIS — I110 Hypertensive heart disease with heart failure: Secondary | ICD-10-CM | POA: Diagnosis not present

## 2015-11-01 DIAGNOSIS — F329 Major depressive disorder, single episode, unspecified: Secondary | ICD-10-CM | POA: Diagnosis not present

## 2015-11-01 DIAGNOSIS — Z7982 Long term (current) use of aspirin: Secondary | ICD-10-CM | POA: Diagnosis not present

## 2015-11-01 DIAGNOSIS — R202 Paresthesia of skin: Secondary | ICD-10-CM | POA: Diagnosis present

## 2015-11-01 DIAGNOSIS — E785 Hyperlipidemia, unspecified: Secondary | ICD-10-CM | POA: Diagnosis not present

## 2015-11-01 DIAGNOSIS — Z79899 Other long term (current) drug therapy: Secondary | ICD-10-CM | POA: Insufficient documentation

## 2015-11-01 DIAGNOSIS — R29818 Other symptoms and signs involving the nervous system: Secondary | ICD-10-CM | POA: Diagnosis not present

## 2015-11-01 DIAGNOSIS — I5032 Chronic diastolic (congestive) heart failure: Secondary | ICD-10-CM | POA: Diagnosis not present

## 2015-11-01 DIAGNOSIS — C801 Malignant (primary) neoplasm, unspecified: Secondary | ICD-10-CM | POA: Diagnosis not present

## 2015-11-01 DIAGNOSIS — Z8673 Personal history of transient ischemic attack (TIA), and cerebral infarction without residual deficits: Secondary | ICD-10-CM | POA: Diagnosis not present

## 2015-11-01 DIAGNOSIS — I10 Essential (primary) hypertension: Secondary | ICD-10-CM | POA: Diagnosis present

## 2015-11-01 LAB — CBC
HCT: 36.2 % (ref 36.0–46.0)
Hemoglobin: 11.8 g/dL — ABNORMAL LOW (ref 12.0–15.0)
MCH: 28.2 pg (ref 26.0–34.0)
MCHC: 32.6 g/dL (ref 30.0–36.0)
MCV: 86.6 fL (ref 78.0–100.0)
PLATELETS: 295 10*3/uL (ref 150–400)
RBC: 4.18 MIL/uL (ref 3.87–5.11)
RDW: 13.1 % (ref 11.5–15.5)
WBC: 10.5 10*3/uL (ref 4.0–10.5)

## 2015-11-01 LAB — DIFFERENTIAL
Basophils Absolute: 0 10*3/uL (ref 0.0–0.1)
Basophils Relative: 0 %
EOS ABS: 0.9 10*3/uL — AB (ref 0.0–0.7)
EOS PCT: 9 %
LYMPHS PCT: 31 %
Lymphs Abs: 3.2 10*3/uL (ref 0.7–4.0)
MONO ABS: 0.5 10*3/uL (ref 0.1–1.0)
Monocytes Relative: 5 %
Neutro Abs: 5.8 10*3/uL (ref 1.7–7.7)
Neutrophils Relative %: 55 %

## 2015-11-01 LAB — I-STAT CHEM 8, ED
BUN: 17 mg/dL (ref 6–20)
CALCIUM ION: 1.13 mmol/L (ref 1.12–1.23)
Chloride: 100 mmol/L — ABNORMAL LOW (ref 101–111)
Creatinine, Ser: 1 mg/dL (ref 0.44–1.00)
Glucose, Bld: 128 mg/dL — ABNORMAL HIGH (ref 65–99)
HEMATOCRIT: 38 % (ref 36.0–46.0)
HEMOGLOBIN: 12.9 g/dL (ref 12.0–15.0)
Potassium: 3.6 mmol/L (ref 3.5–5.1)
SODIUM: 140 mmol/L (ref 135–145)
TCO2: 24 mmol/L (ref 0–100)

## 2015-11-01 LAB — RAPID URINE DRUG SCREEN, HOSP PERFORMED
Amphetamines: NOT DETECTED
Barbiturates: NOT DETECTED
Benzodiazepines: NOT DETECTED
Cocaine: NOT DETECTED
OPIATES: NOT DETECTED
Tetrahydrocannabinol: NOT DETECTED

## 2015-11-01 LAB — URINALYSIS, ROUTINE W REFLEX MICROSCOPIC
Bilirubin Urine: NEGATIVE
Glucose, UA: NEGATIVE mg/dL
Hgb urine dipstick: NEGATIVE
Ketones, ur: NEGATIVE mg/dL
LEUKOCYTES UA: NEGATIVE
NITRITE: NEGATIVE
PH: 7.5 (ref 5.0–8.0)
Protein, ur: NEGATIVE mg/dL
SPECIFIC GRAVITY, URINE: 1.012 (ref 1.005–1.030)

## 2015-11-01 LAB — COMPREHENSIVE METABOLIC PANEL
ALK PHOS: 76 U/L (ref 38–126)
ALT: 13 U/L — ABNORMAL LOW (ref 14–54)
ANION GAP: 15 (ref 5–15)
AST: 18 U/L (ref 15–41)
Albumin: 3.5 g/dL (ref 3.5–5.0)
BILIRUBIN TOTAL: 0.4 mg/dL (ref 0.3–1.2)
BUN: 16 mg/dL (ref 6–20)
CALCIUM: 9.3 mg/dL (ref 8.9–10.3)
CO2: 24 mmol/L (ref 22–32)
Chloride: 98 mmol/L — ABNORMAL LOW (ref 101–111)
Creatinine, Ser: 1.04 mg/dL — ABNORMAL HIGH (ref 0.44–1.00)
GFR calc non Af Amer: 58 mL/min — ABNORMAL LOW (ref >60)
Glucose, Bld: 137 mg/dL — ABNORMAL HIGH (ref 65–99)
Potassium: 3.6 mmol/L (ref 3.5–5.1)
SODIUM: 137 mmol/L (ref 135–145)
TOTAL PROTEIN: 6.4 g/dL — AB (ref 6.5–8.1)

## 2015-11-01 LAB — GLUCOSE, CAPILLARY: GLUCOSE-CAPILLARY: 118 mg/dL — AB (ref 65–99)

## 2015-11-01 LAB — I-STAT TROPONIN, ED: Troponin i, poc: 0 ng/mL (ref 0.00–0.08)

## 2015-11-01 LAB — PROTIME-INR
INR: 0.99 (ref 0.00–1.49)
PROTHROMBIN TIME: 13.3 s (ref 11.6–15.2)

## 2015-11-01 LAB — ETHANOL

## 2015-11-01 LAB — APTT: aPTT: 28 seconds (ref 24–37)

## 2015-11-01 MED ORDER — ACETAMINOPHEN 325 MG PO TABS
650.0000 mg | ORAL_TABLET | ORAL | Status: DC | PRN
  Administered 2015-11-02 (×2): 650 mg via ORAL
  Filled 2015-11-01 (×2): qty 2

## 2015-11-01 MED ORDER — HYDRALAZINE HCL 50 MG PO TABS
50.0000 mg | ORAL_TABLET | Freq: Three times a day (TID) | ORAL | Status: DC
  Administered 2015-11-01 – 2015-11-02 (×3): 50 mg via ORAL
  Filled 2015-11-01 (×3): qty 1

## 2015-11-01 MED ORDER — ENOXAPARIN SODIUM 40 MG/0.4ML ~~LOC~~ SOLN
40.0000 mg | SUBCUTANEOUS | Status: DC
  Administered 2015-11-01: 40 mg via SUBCUTANEOUS
  Filled 2015-11-01: qty 0.4

## 2015-11-01 MED ORDER — ALBUTEROL SULFATE HFA 108 (90 BASE) MCG/ACT IN AERS: 2.0000 | INHALATION_SPRAY | Freq: Four times a day (QID) | RESPIRATORY_TRACT | Status: DC | PRN

## 2015-11-01 MED ORDER — FOLIC ACID 1 MG PO TABS
1.0000 mg | ORAL_TABLET | Freq: Every day | ORAL | Status: DC
  Administered 2015-11-01 – 2015-11-02 (×2): 1 mg via ORAL
  Filled 2015-11-01 (×2): qty 1

## 2015-11-01 MED ORDER — DULOXETINE HCL 30 MG PO CPEP
30.0000 mg | ORAL_CAPSULE | Freq: Every day | ORAL | Status: DC
  Administered 2015-11-02: 30 mg via ORAL
  Filled 2015-11-01: qty 1

## 2015-11-01 MED ORDER — LORAZEPAM 1 MG PO TABS
1.0000 mg | ORAL_TABLET | Freq: Four times a day (QID) | ORAL | Status: DC | PRN
  Administered 2015-11-02: 1 mg via ORAL
  Filled 2015-11-01: qty 1

## 2015-11-01 MED ORDER — ADULT MULTIVITAMIN W/MINERALS CH
1.0000 | ORAL_TABLET | Freq: Every day | ORAL | Status: DC
  Administered 2015-11-01 – 2015-11-02 (×2): 1 via ORAL
  Filled 2015-11-01 (×2): qty 1

## 2015-11-01 MED ORDER — ONDANSETRON HCL 4 MG/2ML IJ SOLN: 4.0000 mg | Freq: Once | INTRAMUSCULAR | Status: DC

## 2015-11-01 MED ORDER — INSULIN ASPART 100 UNIT/ML ~~LOC~~ SOLN
0.0000 [IU] | Freq: Three times a day (TID) | SUBCUTANEOUS | Status: DC
  Administered 2015-11-02: 8 [IU] via SUBCUTANEOUS
  Administered 2015-11-02: 3 [IU] via SUBCUTANEOUS
  Administered 2015-11-02: 5 [IU] via SUBCUTANEOUS

## 2015-11-01 MED ORDER — TRIAMTERENE-HCTZ 37.5-25 MG PO TABS
1.0000 | ORAL_TABLET | Freq: Every day | ORAL | Status: DC
  Administered 2015-11-02: 1 via ORAL
  Filled 2015-11-01: qty 1

## 2015-11-01 MED ORDER — VITAMIN B-1 100 MG PO TABS
100.0000 mg | ORAL_TABLET | Freq: Every day | ORAL | Status: DC
  Administered 2015-11-01: 100 mg via ORAL
  Filled 2015-11-01 (×2): qty 1

## 2015-11-01 MED ORDER — ACETAMINOPHEN 650 MG RE SUPP: 650.0000 mg | RECTAL | Status: DC | PRN

## 2015-11-01 MED ORDER — ALBUTEROL SULFATE (2.5 MG/3ML) 0.083% IN NEBU: 3.0000 mL | INHALATION_SOLUTION | Freq: Four times a day (QID) | RESPIRATORY_TRACT | Status: DC | PRN

## 2015-11-01 MED ORDER — LORAZEPAM 2 MG/ML IJ SOLN
1.0000 mg | Freq: Four times a day (QID) | INTRAMUSCULAR | Status: DC | PRN
  Administered 2015-11-02 (×2): 1 mg via INTRAVENOUS
  Filled 2015-11-01 (×2): qty 1

## 2015-11-01 MED ORDER — SENNOSIDES-DOCUSATE SODIUM 8.6-50 MG PO TABS: 1.0000 | ORAL_TABLET | Freq: Every evening | ORAL | Status: DC | PRN

## 2015-11-01 MED ORDER — DILTIAZEM HCL ER COATED BEADS 240 MG PO CP24
240.0000 mg | ORAL_CAPSULE | Freq: Every morning | ORAL | Status: DC
  Administered 2015-11-02: 240 mg via ORAL
  Filled 2015-11-01 (×2): qty 1

## 2015-11-01 MED ORDER — STROKE: EARLY STAGES OF RECOVERY BOOK
Freq: Once | Status: AC
  Administered 2015-11-01: 22:00:00
  Filled 2015-11-01: qty 1

## 2015-11-01 MED ORDER — THIAMINE HCL 100 MG/ML IJ SOLN
100.0000 mg | Freq: Every day | INTRAMUSCULAR | Status: DC
Start: 2015-11-01 — End: 2015-11-03
  Administered 2015-11-02: 100 mg via INTRAVENOUS
  Filled 2015-11-01: qty 2

## 2015-11-01 MED ORDER — ASPIRIN 325 MG PO TABS
325.0000 mg | ORAL_TABLET | Freq: Every day | ORAL | Status: DC
  Administered 2015-11-02: 325 mg via ORAL
  Filled 2015-11-01: qty 1

## 2015-11-01 MED ORDER — ASPIRIN 325 MG PO TABS: 325.0000 mg | ORAL_TABLET | Freq: Every day | ORAL | Status: DC

## 2015-11-01 MED ORDER — ROSUVASTATIN CALCIUM 20 MG PO TABS
20.0000 mg | ORAL_TABLET | Freq: Every evening | ORAL | Status: DC
  Administered 2015-11-01: 20 mg via ORAL
  Filled 2015-11-01 (×2): qty 1

## 2015-11-01 MED ORDER — FLUOXETINE HCL 20 MG PO CAPS
20.0000 mg | ORAL_CAPSULE | Freq: Every day | ORAL | Status: DC
  Administered 2015-11-02: 20 mg via ORAL
  Filled 2015-11-01: qty 1

## 2015-11-01 NOTE — Consult Note (Signed)
Admission H&P    Chief Complaint: Transient left facial numbness and slurred speech.  HPI: Lisa Anderson is an 60 y.o. female history of hypertension, hyperlipidemia, diabetes mellitus and cerebral infarction in November 2016, presenting with transient numbness involving left side of her face as well as slurred speech. Symptoms onset was at 1:00 PM today, and symptoms lasted 20 minutes then resolved. She had a feeling of tightness or pressure in her head as well at the same time. She had similar symptoms with the onset of her stroke in November 2016. She's been taking aspirin daily. CT scan of her head showed no acute intracranial abnormality. Chronic right parietal cortical infarction was noted. NIH stroke score was 0 at the time of this evaluation.  LSN: 1:00 PM on 11/01/2015 tPA Given: No: Deficits resolved mRankin:  Past Medical History  Diagnosis Date  . Hypertension   . Diabetes mellitus   . Hyperlipidemia   . Asthma   . Cancer [facility])     Past Surgical History  Procedure Laterality Date  . Tonsillectomy    . Breast lumpectomy    . Ep implantable device N/A 08/07/2015    Procedure: Loop Recorder Insertion;  Surgeon: Thompson Grayer, MD;  Location: [place] [facility];  Service: Cardiovascular;  Laterality: N/A;  . Tee without cardioversion N/A 08/07/2015    Procedure: TRANSESOPHAGEAL ECHOCARDIOGRAM (TEE);  Surgeon: Sueanne Margarita, MD;  Location: [facility] ENDOSCOPY;  Service: Cardiovascular;  Laterality: N/A;    Family History  Problem Relation Age of Onset  . Cancer Mother   . Diabetes Mother   . Hyperlipidemia Mother   . Hypertension Mother   . Rheum arthritis Father   . Asthma Father   . Stroke Father   . Thyroid disease Father   . Thyroid disease Brother    Social History:  reports that she has been smoking.  She does not have any smokeless tobacco history on file. She reports that she drinks about 3.6 oz of alcohol per week. She reports that she uses illicit drugs ("Crack"  cocaine).  Allergies:  Allergies  Allergen Reactions  . Prednisone Hives and Other (See Comments)    Made her "crazy", suicidal  . Benicar [Olmesartan] Other (See Comments)    Did not work at all for patient  . Celexa [Citalopram] Other (See Comments)    Made patient feel crazy, fluoxetine is fine  . Effexor [Venlafaxine] Other (See Comments)    Made patient feel crazy  . Shrimp [Shellfish Allergy] Hives and Swelling    Medications : Patient's preadmission medications were reviewed by me.  ROS: History obtained from the patient  General ROS: negative for - chills, fatigue, fever, night sweats, weight gain or weight loss Psychological ROS: negative for - behavioral disorder, hallucinations, memory difficulties, mood swings or suicidal ideation Ophthalmic ROS: negative for - blurry vision, double vision, eye pain or loss of vision ENT ROS: negative for - epistaxis, nasal discharge, oral lesions, sore throat, tinnitus or vertigo Allergy and Immunology ROS: negative for - hives or itchy/watery eyes Hematological and Lymphatic ROS: negative for - bleeding problems, bruising or swollen lymph nodes Endocrine ROS: negative for - galactorrhea, hair pattern changes, polydipsia/polyuria or temperature intolerance Respiratory ROS: negative for - cough, hemoptysis, shortness of breath or wheezing Cardiovascular ROS: negative for - chest pain, dyspnea on exertion, edema or irregular heartbeat Gastrointestinal ROS: negative for - abdominal pain, diarrhea, hematemesis, nausea/vomiting or stool incontinence Genito-Urinary ROS: negative for - dysuria, hematuria, incontinence or urinary frequency/urgency Musculoskeletal  ROS: negative for - joint swelling or muscular weakness Neurological ROS: as noted in HPI Dermatological ROS: negative for rash and skin lesion changes  Physical Examination: Blood pressure 149/70, pulse 69, temperature 98.1 F (36.7 C), temperature source Oral, resp. rate 20,  height 5' 5" (1.651 m), weight 73.936 kg (163 lb), SpO2 99 %.  HEENT-  Normocephalic, no lesions, without obvious abnormality.  Normal external eye and conjunctiva.  Normal TM's bilaterally.  Normal auditory canals and external ears. Normal external nose, mucus membranes and septum.  Normal pharynx. Neck supple with no masses, nodes, nodules or enlargement. Cardiovascular - regular rate and rhythm and systolic murmur: early systolic 2/6, crescendo at 2nd right intercostal space Lungs - chest clear, no wheezing, rales, normal symmetric air entry Abdomen - soft, non-tender; bowel sounds normal; no masses,  no organomegaly Extremities - no joint deformities, effusion, or inflammation and no edema  Neurologic Examination: Mental Status: Alert, oriented, thought content appropriate.  Speech fluent without evidence of aphasia. Able to follow commands without difficulty. Cranial Nerves: II-Visual fields were normal. III/IV/VI-Pupils were equal and reacted normally to light. Extraocular movements were full and conjugate.    V/VII-no facial numbness and no facial weakness. VIII-normal. X-normal speech and symmetrical palatal movement. XI: trapezius strength/neck flexion strength normal bilaterally XII-midline tongue extension with normal strength. Motor: 5/5 bilaterally with normal tone and bulk Sensory: Normal throughout. Deep Tendon Reflexes: 1+ and symmetric. Plantars: Flexor bilaterally Cerebellar: Normal finger-to-nose testing. Carotid auscultation: Normal  Results for orders placed or performed during the hospital encounter of 11/01/15 (from the past 48 hour(s))  Protime-INR     Status: None   Collection Time: 11/01/15  3:32 PM  Result Value Ref Range   Prothrombin Time 13.3 11.6 - 15.2 seconds   INR 0.99 0.00 - 1.49  APTT     Status: None   Collection Time: 11/01/15  3:32 PM  Result Value Ref Range   aPTT 28 24 - 37 seconds  CBC     Status: Abnormal   Collection Time: 11/01/15   3:32 PM  Result Value Ref Range   WBC 10.5 4.0 - 10.5 K/uL   RBC 4.18 3.87 - 5.11 MIL/uL   Hemoglobin 11.8 (L) 12.0 - 15.0 g/dL   HCT 36.2 36.0 - 46.0 %   MCV 86.6 78.0 - 100.0 fL   MCH 28.2 26.0 - 34.0 pg   MCHC 32.6 30.0 - 36.0 g/dL   RDW 13.1 11.5 - 15.5 %   Platelets 295 150 - 400 K/uL  Differential     Status: Abnormal   Collection Time: 11/01/15  3:32 PM  Result Value Ref Range   Neutrophils Relative % 55 %   Neutro Abs 5.8 1.7 - 7.7 K/uL   Lymphocytes Relative 31 %   Lymphs Abs 3.2 0.7 - 4.0 K/uL   Monocytes Relative 5 %   Monocytes Absolute 0.5 0.1 - 1.0 K/uL   Eosinophils Relative 9 %   Eosinophils Absolute 0.9 (H) 0.0 - 0.7 K/uL   Basophils Relative 0 %   Basophils Absolute 0.0 0.0 - 0.1 K/uL  Comprehensive metabolic panel     Status: Abnormal   Collection Time: 11/01/15  3:32 PM  Result Value Ref Range   Sodium 137 135 - 145 mmol/L   Potassium 3.6 3.5 - 5.1 mmol/L   Chloride 98 (L) 101 - 111 mmol/L   CO2 24 22 - 32 mmol/L   Glucose, Bld 137 (H) 65 - 99 mg/dL   BUN  16 6 - 20 mg/dL   Creatinine, Ser 1.04 (H) 0.44 - 1.00 mg/dL   Calcium 9.3 8.9 - 10.3 mg/dL   Total Protein 6.4 (L) 6.5 - 8.1 g/dL   Albumin 3.5 3.5 - 5.0 g/dL   AST 18 15 - 41 U/L   ALT 13 (L) 14 - 54 U/L   Alkaline Phosphatase 76 38 - 126 U/L   Total Bilirubin 0.4 0.3 - 1.2 mg/dL   GFR calc non Af Amer 58 (L) >60 mL/min   GFR calc Af Amer >60 >60 mL/min    Comment: (NOTE) The eGFR has been calculated using the CKD EPI equation. This calculation has not been validated in all clinical situations. eGFR's persistently <60 mL/min signify possible Chronic Kidney Disease.    Anion gap 15 5 - 15  Urine rapid drug screen (hosp performed)not at [facility]     Status: None   Collection Time: 11/01/15  3:36 PM  Result Value Ref Range   Opiates NONE DETECTED NONE DETECTED   Cocaine NONE DETECTED NONE DETECTED   Benzodiazepines NONE DETECTED NONE DETECTED   Amphetamines NONE DETECTED NONE DETECTED    Tetrahydrocannabinol NONE DETECTED NONE DETECTED   Barbiturates NONE DETECTED NONE DETECTED    Comment:        DRUG SCREEN FOR MEDICAL PURPOSES ONLY.  IF CONFIRMATION IS NEEDED FOR ANY PURPOSE, NOTIFY LAB WITHIN 5 DAYS.        LOWEST DETECTABLE LIMITS FOR URINE DRUG SCREEN Drug Class       Cutoff (ng/mL) Amphetamine      1000 Barbiturate      200 Benzodiazepine   785 Tricyclics       885 Opiates          300 Cocaine          300 THC              50   Urinalysis, Routine w reflex microscopic (not at [facility])     Status: None   Collection Time: 11/01/15  3:36 PM  Result Value Ref Range   Color, Urine YELLOW YELLOW   APPearance CLEAR CLEAR   Specific Gravity, Urine 1.012 1.005 - 1.030   pH 7.5 5.0 - 8.0   Glucose, UA NEGATIVE NEGATIVE mg/dL   Hgb urine dipstick NEGATIVE NEGATIVE   Bilirubin Urine NEGATIVE NEGATIVE   Ketones, ur NEGATIVE NEGATIVE mg/dL   Protein, ur NEGATIVE NEGATIVE mg/dL   Nitrite NEGATIVE NEGATIVE   Leukocytes, UA NEGATIVE NEGATIVE    Comment: MICROSCOPIC NOT DONE ON URINES WITH NEGATIVE PROTEIN, BLOOD, LEUKOCYTES, NITRITE, OR GLUCOSE <1000 mg/dL.  I-stat troponin, ED (not at [facility], [facility])     Status: None   Collection Time: 11/01/15  3:42 PM  Result Value Ref Range   Troponin i, poc 0.00 0.00 - 0.08 ng/mL   Comment 3            Comment: Due to the release kinetics of cTnI, a negative result within the first hours of the onset of symptoms does not rule out myocardial infarction with certainty. If myocardial infarction is still suspected, repeat the test at appropriate intervals.   I-Stat Chem 8, ED  (not at [facility], [facility])     Status: Abnormal   Collection Time: 11/01/15  3:44 PM  Result Value Ref Range   Sodium 140 135 - 145 mmol/L   Potassium 3.6 3.5 - 5.1 mmol/L   Chloride 100 (L) 101 - 111 mmol/L   BUN 17 6 -  20 mg/dL   Creatinine, Ser 1.00 0.44 - 1.00 mg/dL   Glucose, Bld 128 (H) 65 - 99 mg/dL   Calcium, Ion 1.13 1.12 - 1.23 mmol/L   TCO2 24 0 - 100  mmol/L   Hemoglobin 12.9 12.0 - 15.0 g/dL   HCT 38.0 36.0 - 46.0 %   Ct Head Wo Contrast  11/01/2015  CLINICAL DATA:  TIA.  Slurred speech EXAM: CT HEAD WITHOUT CONTRAST TECHNIQUE: Contiguous axial images were obtained from the base of the skull through the vertex without intravenous contrast. COMPARISON:  CT and MRI 08/05/2015 FINDINGS: Chronic infarct right parietal lobe. No other areas of chronic ischemia Negative for acute infarct. Negative for acute hemorrhage. No mass or edema. Ventricle size normal.  No shift of the midline structures. Normal calvarium.  Mild mucosal edema in the paranasal sinuses. IMPRESSION: Chronic infarct right parietal cortex.  No acute abnormality. Electronically Signed   By: Franchot Gallo M.D.   On: 11/01/2015 16:49    Assessment: 60 y.o. female with multiple risk factors for stroke as well as history of previous stroke, presenting with probable transischemic attack. However, recurrent small right ischemic infarction cannot be ruled out.  Stroke Risk Factors - diabetes mellitus, family history, hyperlipidemia and hypertension  Plan: 1. HgbA1c, fasting lipid panel 2. MRI, MRA  of the brain without contrast 3. PT consult, OT consult, Speech consult 4. Carotid dopplers 5. Prophylactic therapy-Antiplatelet med: Aspirin  6. Risk factor modification 7. Telemetry monitoring  C.R. Nicole Kindred, MD [facility] [phone]  11/01/2015, 7:37 PM

## 2015-11-01 NOTE — ED Provider Notes (Signed)
CSN: [id number]     Arrival date & time 11/01/15  1421 History   First MD Initiated Contact with Patient 11/01/15 1459     Chief Complaint  Patient presents with  . Tingling    right arm     (Consider location/radiation/quality/duration/timing/severity/associated sxs/prior Treatment) HPI Comments: Patient with past medical history of hypertension, diabetes, hyperlipidemia, asthma, reported prior stroke presents to the emergency department with chief complaint of stroke-like symptoms.  She states that this afternoon around 1:00 she had some slurred speech and what felt like a tingling/numbness of her face. She denies any of these symptoms now. She states that they're resolved spontaneously. She denies any numbness, weakness, or tingling of her extremities. She reports a mild headache and feeling nauseated. She denies any chest pain or shortness of breath. She takes an aspirin daily.  The history is provided by the patient. No language interpreter was used.    Past Medical History  Diagnosis Date  . Hypertension   . Diabetes mellitus   . Hyperlipidemia   . Asthma   . Cancer [facility])    Past Surgical History  Procedure Laterality Date  . Tonsillectomy    . Breast lumpectomy    . Ep implantable device N/A 08/07/2015    Procedure: Loop Recorder Insertion;  Surgeon: Thompson Grayer, MD;  Location: [place] [facility];  Service: Cardiovascular;  Laterality: N/A;  . Tee without cardioversion N/A 08/07/2015    Procedure: TRANSESOPHAGEAL ECHOCARDIOGRAM (TEE);  Surgeon: Sueanne Margarita, MD;  Location: [facility] ENDOSCOPY;  Service: Cardiovascular;  Laterality: N/A;   Family History  Problem Relation Age of Onset  . Cancer Mother   . Diabetes Mother   . Hyperlipidemia Mother   . Hypertension Mother   . Rheum arthritis Father   . Asthma Father   . Stroke Father   . Thyroid disease Father   . Thyroid disease Brother    Social History  Substance Use Topics  . Smoking status: Current Some Day Smoker   . Smokeless tobacco: None  . Alcohol Use: 3.6 oz/week    6 Cans of beer per week   OB History    No data available     Review of Systems  Constitutional: Negative for fever and chills.  Respiratory: Negative for shortness of breath.   Cardiovascular: Negative for chest pain.  Gastrointestinal: Negative for nausea, vomiting, diarrhea and constipation.  Genitourinary: Negative for dysuria.  Neurological: Positive for speech difficulty, numbness and headaches.  All other systems reviewed and are negative.     Allergies  Prednisone; Benicar; Celexa; Effexor; and Shrimp  Home Medications   Prior to Admission medications   Medication Sig Start Date End Date Taking? Authorizing Provider  albuterol (PROVENTIL HFA;VENTOLIN HFA) 108 (90 BASE) MCG/ACT inhaler Inhale 2 puffs into the lungs every 6 (six) hours as needed for wheezing or shortness of breath. Shortness of breath 09/02/15  Yes Arnoldo Morale, MD  aspirin 325 MG tablet Take 1 tablet (325 mg total) by mouth daily. 08/07/15  Yes Ripudeep Krystal Eaton, MD  diltiazem (DILACOR XR) 240 MG 24 hr capsule Take 1 capsule (240 mg total) by mouth every morning. 08/07/15  Yes Ripudeep Krystal Eaton, MD  DULoxetine (CYMBALTA) 30 MG capsule Take 1 capsule (30 mg total) by mouth daily. 09/02/15  Yes Arnoldo Morale, MD  FLUoxetine (PROZAC) 20 MG capsule Take 20 mg by mouth daily.   Yes Historical Provider, MD  glipiZIDE (GLUCOTROL) 5 MG tablet Take 1 tablet (5 mg total) by  mouth 2 (two) times daily before a meal. 08/07/15  Yes Ripudeep K Rai, MD  hydrALAZINE (APRESOLINE) 50 MG tablet Take 1 tablet (50 mg total) by mouth 3 (three) times daily. 08/07/15  Yes Ripudeep Krystal Eaton, MD  ibuprofen (ADVIL,MOTRIN) 200 MG tablet Take 200 mg by mouth every 6 (six) hours as needed for headache.   Yes Historical Provider, MD  insulin aspart (NOVOLOG) 100 UNIT/ML injection Sliding scale CBG 70 - 120: 0 units CBG 121 - 150: 1 unit,  CBG 151 - 200: 2 units,  CBG 201 - 250: 3 units,  CBG  251 - 300: 5 units,  CBG 301 - 350: 7 units,  CBG 351 - 400: 9 units   CBG > 400: 9 units and notify your MD 08/07/15  Yes Ripudeep Krystal Eaton, MD  metFORMIN (GLUCOPHAGE) 1000 MG tablet Take 1 tablet (1,000 mg total) by mouth 2 (two) times daily with a meal. 08/07/15  Yes Ripudeep K Rai, MD  rosuvastatin (CRESTOR) 20 MG tablet Take 1 tablet (20 mg total) by mouth every evening. 08/07/15  Yes Ripudeep Krystal Eaton, MD  triamterene-hydrochlorothiazide (MAXZIDE-25) 37.5-25 MG tablet Take 1 tablet by mouth daily. 08/07/15  Yes Ripudeep K Rai, MD   BP 165/66 mmHg  Pulse 76  Temp(Src) 98.1 F (36.7 C) (Oral)  Resp 13  Ht 5\' 5"  (1.651 m)  Wt 73.936 kg  BMI 27.12 kg/m2  SpO2 99% Physical Exam  Constitutional: She is oriented to person, place, and time. She appears well-developed and well-nourished.  HENT:  Head: Normocephalic and atraumatic.  Right Ear: External ear normal.  Left Ear: External ear normal.  Eyes: Conjunctivae and EOM are normal. Pupils are equal, round, and reactive to light.  Neck: Normal range of motion. Neck supple.  No pain with neck flexion, no meningismus  Cardiovascular: Normal rate, regular rhythm and normal heart sounds.  Exam reveals no gallop and no friction rub.   No murmur heard. Pulmonary/Chest: Effort normal and breath sounds normal. No respiratory distress. She has no wheezes. She has no rales. She exhibits no tenderness.  Abdominal: Soft. Bowel sounds are normal. She exhibits no distension and no mass. There is no tenderness. There is no rebound and no guarding.  Musculoskeletal: Normal range of motion. She exhibits no edema or tenderness.  Normal gait.  Neurological: She is alert and oriented to person, place, and time.  CN 3-12 intact, normal finger to nose, no pronator drift, sensation and strength intact bilaterally, normal heel-to-shin, normal visual fields  Skin: Skin is warm and dry.  Psychiatric: She has a normal mood and affect. Her behavior is normal. Judgment  and thought content normal.  Nursing note and vitals reviewed.   ED Course  Procedures (including critical care time) Results for orders placed or performed during the hospital encounter of 11/01/15  Protime-INR  Result Value Ref Range   Prothrombin Time 13.3 11.6 - 15.2 seconds   INR 0.99 0.00 - 1.49  APTT  Result Value Ref Range   aPTT 28 24 - 37 seconds  CBC  Result Value Ref Range   WBC 10.5 4.0 - 10.5 K/uL   RBC 4.18 3.87 - 5.11 MIL/uL   Hemoglobin 11.8 (L) 12.0 - 15.0 g/dL   HCT 36.2 36.0 - 46.0 %   MCV 86.6 78.0 - 100.0 fL   MCH 28.2 26.0 - 34.0 pg   MCHC 32.6 30.0 - 36.0 g/dL   RDW 13.1 11.5 - 15.5 %   Platelets 295 150 -  400 K/uL  Differential  Result Value Ref Range   Neutrophils Relative % 55 %   Neutro Abs 5.8 1.7 - 7.7 K/uL   Lymphocytes Relative 31 %   Lymphs Abs 3.2 0.7 - 4.0 K/uL   Monocytes Relative 5 %   Monocytes Absolute 0.5 0.1 - 1.0 K/uL   Eosinophils Relative 9 %   Eosinophils Absolute 0.9 (H) 0.0 - 0.7 K/uL   Basophils Relative 0 %   Basophils Absolute 0.0 0.0 - 0.1 K/uL  Comprehensive metabolic panel  Result Value Ref Range   Sodium 137 135 - 145 mmol/L   Potassium 3.6 3.5 - 5.1 mmol/L   Chloride 98 (L) 101 - 111 mmol/L   CO2 24 22 - 32 mmol/L   Glucose, Bld 137 (H) 65 - 99 mg/dL   BUN 16 6 - 20 mg/dL   Creatinine, Ser 1.04 (H) 0.44 - 1.00 mg/dL   Calcium 9.3 8.9 - 10.3 mg/dL   Total Protein 6.4 (L) 6.5 - 8.1 g/dL   Albumin 3.5 3.5 - 5.0 g/dL   AST 18 15 - 41 U/L   ALT 13 (L) 14 - 54 U/L   Alkaline Phosphatase 76 38 - 126 U/L   Total Bilirubin 0.4 0.3 - 1.2 mg/dL   GFR calc non Af Amer 58 (L) >60 mL/min   GFR calc Af Amer >60 >60 mL/min   Anion gap 15 5 - 15  Urine rapid drug screen (hosp performed)not at [facility]  Result Value Ref Range   Opiates NONE DETECTED NONE DETECTED   Cocaine NONE DETECTED NONE DETECTED   Benzodiazepines NONE DETECTED NONE DETECTED   Amphetamines NONE DETECTED NONE DETECTED   Tetrahydrocannabinol NONE  DETECTED NONE DETECTED   Barbiturates NONE DETECTED NONE DETECTED  Urinalysis, Routine w reflex microscopic (not at [facility])  Result Value Ref Range   Color, Urine YELLOW YELLOW   APPearance CLEAR CLEAR   Specific Gravity, Urine 1.012 1.005 - 1.030   pH 7.5 5.0 - 8.0   Glucose, UA NEGATIVE NEGATIVE mg/dL   Hgb urine dipstick NEGATIVE NEGATIVE   Bilirubin Urine NEGATIVE NEGATIVE   Ketones, ur NEGATIVE NEGATIVE mg/dL   Protein, ur NEGATIVE NEGATIVE mg/dL   Nitrite NEGATIVE NEGATIVE   Leukocytes, UA NEGATIVE NEGATIVE  I-Stat Chem 8, ED  (not at [facility], [facility])  Result Value Ref Range   Sodium 140 135 - 145 mmol/L   Potassium 3.6 3.5 - 5.1 mmol/L   Chloride 100 (L) 101 - 111 mmol/L   BUN 17 6 - 20 mg/dL   Creatinine, Ser 1.00 0.44 - 1.00 mg/dL   Glucose, Bld 128 (H) 65 - 99 mg/dL   Calcium, Ion 1.13 1.12 - 1.23 mmol/L   TCO2 24 0 - 100 mmol/L   Hemoglobin 12.9 12.0 - 15.0 g/dL   HCT 38.0 36.0 - 46.0 %  I-stat troponin, ED (not at [facility], [facility])  Result Value Ref Range   Troponin i, poc 0.00 0.00 - 0.08 ng/mL   Comment 3           Ct Head Wo Contrast  11/01/2015  CLINICAL DATA:  TIA.  Slurred speech EXAM: CT HEAD WITHOUT CONTRAST TECHNIQUE: Contiguous axial images were obtained from the base of the skull through the vertex without intravenous contrast. COMPARISON:  CT and MRI 08/05/2015 FINDINGS: Chronic infarct right parietal lobe. No other areas of chronic ischemia Negative for acute infarct. Negative for acute hemorrhage. No mass or edema. Ventricle size normal.  No shift of the  midline structures. Normal calvarium.  Mild mucosal edema in the paranasal sinuses. IMPRESSION: Chronic infarct right parietal cortex.  No acute abnormality. Electronically Signed   By: Franchot Gallo M.D.   On: 11/01/2015 16:49    I have personally reviewed and evaluated these images and lab results as part of my medical decision-making.   EKG Interpretation None      MDM   Final diagnoses:  Transient cerebral  ischemia, unspecified transient cerebral ischemia type    Patient with slurred speech and facial numbness. Symptoms started at around 1:00. They have since completely resolved. She denies any further numbness, weakness, or tingling. Denies any difficulty speaking now. Code stroke not activated because of resolution of symptoms. TPA not given because of resolution of symptoms. Patient will require stroke workup. Additionally, she reports that she has had a stroke in the past. She denies any residual deficits from this.  Patient discussed with Dr. Johnney Killian, who agrees with plan for neurology consult.  Patient discussed with Dr. Leonel Ramsay. Appreciate consultation. Dr. Leonel Ramsay recommends TIA workup and admission. He will consult on the patient.  Appreciate Dr. Myna Hidalgo for admitting the patient.   Montine Circle, PA-C 11/01/15 1926  Charlesetta Shanks, MD 11/01/15 [phone]

## 2015-11-01 NOTE — H&P (Signed)
[facility] History and Physical  Oshun Perrilloux [id number] DOB: 08-May-1956 DOA: 11/01/2015  Referring physician: ED physician PCP: [facility]  Specialists: Dr. Jaynee Eagles, neurology  Chief Complaint:  Headache, left-sided paraesthesia, speech change   HPI: Lisa Anderson is a 60 y.o. female with PMH of insulin-dependent diabetes mellitus, hypertension, cocaine abuse in remission, and recent embolic stroke who presents to the ED with headache, change in speech, and left-sided paresthesias, onset 1 PM. Patient was admitted to this institution in November 2016 with acute strokelike symptoms and found to have right parietal embolic CVA, source of embolism undetermined after extensive workup including TEE and event monitoring. Patient reports that there are no residual deficits from that infarct and she has been in her usual state of health. She denies any recent fever, chills, nausea, vomiting, diarrhea, cough, or dysuria. She denies any illicit drug use. She was at rest this afternoon at approximately 1 PM when she developed acute onset headache that she describes as moderate in severity, "tightness" in character, and in a bandlike distribution around her head. She is unable to identify any alleviating or exacerbating factors but states that the intensity has lessened since time of onset. She describes an associated change in her speech but she is unable to further characterize. She also notes tingling throughout the left side of her body and face. She denies confusion, loss of coordination, or focal weakness.  In ED, patient was found to be afebrile, saturating well on room air, and with vital signs stable. EKG demonstrated sinus rhythm and noncontrast head CT was negative for any acute intracranial abnormality. Initial blood work including chem panel and CBC is largely unremarkable. Urine drug screen is negative and urinalysis is not concerning for infection. Patient reports that  paresthesias and speech difficulty resolved spontaneously after about 20 minutes, but reports persistence of her headache. Dr. Nicole Kindred of neurology was consulted from the ED and kindly came down to evaluate the patient. Per his recommendation, she will be admitted to the telemetry unit for ongoing evaluation and management of potential TIA/CVA.  Where does patient live?   At home     Can patient participate in ADLs?  Yes       Review of Systems:   General: no fevers, chills, sweats, weight change, poor appetite, or fatigue HEENT: no blurry vision, hearing changes or sore throat Pulm: no dyspnea, cough, or wheeze CV: no chest pain or palpitations Abd: no nausea, vomiting, abdominal pain, diarrhea, or constipation GU: no dysuria, hematuria, increased urinary frequency, or urgency  Ext: no leg edema Neuro: no focal weakness, no vision change or hearing loss. Left-sided paraesthesias, HA in band-like distribution, speech change Skin: no rash, no wounds MSK: No muscle spasm, no deformity, no red, hot, or swollen joint Heme: No easy bruising or bleeding Travel history: No recent long distant travel    Allergy:  Allergies  Allergen Reactions  . Prednisone Hives and Other (See Comments)    Made her "crazy", suicidal  . Benicar [Olmesartan] Other (See Comments)    Did not work at all for patient  . Celexa [Citalopram] Other (See Comments)    Made patient feel crazy, fluoxetine is fine  . Effexor [Venlafaxine] Other (See Comments)    Made patient feel crazy  . Shrimp [Shellfish Allergy] Hives and Swelling    Past Medical History  Diagnosis Date  . Hypertension   . Diabetes mellitus   . Hyperlipidemia   . Asthma   . Cancer ([place])  Past Surgical History  Procedure Laterality Date  . Tonsillectomy    . Breast lumpectomy    . Ep implantable device N/A 08/07/2015    Procedure: Loop Recorder Insertion;  Surgeon: Thompson Grayer, MD;  Location: [place] [facility];  Service:  Cardiovascular;  Laterality: N/A;  . Tee without cardioversion N/A 08/07/2015    Procedure: TRANSESOPHAGEAL ECHOCARDIOGRAM (TEE);  Surgeon: Sueanne Margarita, MD;  Location: [facility] ENDOSCOPY;  Service: Cardiovascular;  Laterality: N/A;    Social History:  reports that she has been smoking.  She does not have any smokeless tobacco history on file. She reports that she drinks about 3.6 oz of alcohol per week. She reports that she uses illicit drugs ("Crack" cocaine).  Family History:  Family History  Problem Relation Age of Onset  . Cancer Mother   . Diabetes Mother   . Hyperlipidemia Mother   . Hypertension Mother   . Rheum arthritis Father   . Asthma Father   . Stroke Father   . Thyroid disease Father   . Thyroid disease Brother      Prior to Admission medications   Medication Sig Start Date End Date Taking? Authorizing Provider  albuterol (PROVENTIL HFA;VENTOLIN HFA) 108 (90 BASE) MCG/ACT inhaler Inhale 2 puffs into the lungs every 6 (six) hours as needed for wheezing or shortness of breath. Shortness of breath 09/02/15  Yes Arnoldo Morale, MD  aspirin 325 MG tablet Take 1 tablet (325 mg total) by mouth daily. 08/07/15  Yes Ripudeep Krystal Eaton, MD  diltiazem (DILACOR XR) 240 MG 24 hr capsule Take 1 capsule (240 mg total) by mouth every morning. 08/07/15  Yes Ripudeep Krystal Eaton, MD  DULoxetine (CYMBALTA) 30 MG capsule Take 1 capsule (30 mg total) by mouth daily. 09/02/15  Yes Arnoldo Morale, MD  FLUoxetine (PROZAC) 20 MG capsule Take 20 mg by mouth daily.   Yes Historical Provider, MD  glipiZIDE (GLUCOTROL) 5 MG tablet Take 1 tablet (5 mg total) by mouth 2 (two) times daily before a meal. 08/07/15  Yes Ripudeep K Rai, MD  hydrALAZINE (APRESOLINE) 50 MG tablet Take 1 tablet (50 mg total) by mouth 3 (three) times daily. 08/07/15  Yes Ripudeep Krystal Eaton, MD  ibuprofen (ADVIL,MOTRIN) 200 MG tablet Take 200 mg by mouth every 6 (six) hours as needed for headache.   Yes Historical Provider, MD  insulin aspart  (NOVOLOG) 100 UNIT/ML injection Sliding scale CBG 70 - 120: 0 units CBG 121 - 150: 1 unit,  CBG 151 - 200: 2 units,  CBG 201 - 250: 3 units,  CBG 251 - 300: 5 units,  CBG 301 - 350: 7 units,  CBG 351 - 400: 9 units   CBG > 400: 9 units and notify your MD 08/07/15  Yes Ripudeep Krystal Eaton, MD  metFORMIN (GLUCOPHAGE) 1000 MG tablet Take 1 tablet (1,000 mg total) by mouth 2 (two) times daily with a meal. 08/07/15  Yes Ripudeep K Rai, MD  rosuvastatin (CRESTOR) 20 MG tablet Take 1 tablet (20 mg total) by mouth every evening. 08/07/15  Yes Ripudeep Krystal Eaton, MD  triamterene-hydrochlorothiazide (MAXZIDE-25) 37.5-25 MG tablet Take 1 tablet by mouth daily. 08/07/15  Yes Ripudeep Krystal Eaton, MD    Physical Exam: Filed Vitals:   11/01/15 1930 11/01/15 1936 11/01/15 1945 11/01/15 2000  BP: 149/70 149/70 161/75 157/71  Pulse: 68 69 70 66  Temp:      TempSrc:      Resp: 19 20 16 14   Height:  Weight:      SpO2: 99% 99% 98% 96%   General: Not in acute distress HEENT:       Eyes: PERRL, EOMI, no scleral icterus or conjunctival pallor.       ENT: No discharge from the ears or nose, no pharyngeal ulcers, petechiae or exudate, no tonsillar enlargement.        Neck: No JVD, no bruit, no appreciable mass. No neck stiffness or pain with ROM  Heme: No cervical adenopathy, no pallor Cardiac: S1/S2, RRR, No murmurs, No gallops or rubs. Pulm: Good air movement bilaterally. No rales, wheezing, rhonchi or rubs. Abd: Soft, nondistended, nontender, no rebound pain or gaurding, no mass or organomegaly, BS present. Ext: Trace b/l LE edema. 2+DP/PT pulse bilaterally. Musculoskeletal: No gross deformity, no red, hot, swollen joints, no limitation in ROM  Skin: No rashes or wounds on exposed surfaces  Neuro: Alert, oriented X3, cranial nerves II-XII grossly intact, muscle strength 5/5 in all extremities, sensation to light touch intact. Brachial reflex 2+ bilaterally. Patellar reflex 2+ bilaterally. Negative Babinski's sign.  Normal finger to nose test. No focal findings Psych: Patient is not overtly psychotic, appropriate mood and affect.  Labs on Admission:  Basic Metabolic Panel:  Recent Labs Lab 11/01/15 1532 11/01/15 1544  NA 137 140  K 3.6 3.6  CL 98* 100*  CO2 24  --   GLUCOSE 137* 128*  BUN 16 17  CREATININE 1.04* 1.00  CALCIUM 9.3  --    Liver Function Tests:  Recent Labs Lab 11/01/15 1532  AST 18  ALT 13*  ALKPHOS 76  BILITOT 0.4  PROT 6.4*  ALBUMIN 3.5   No results for input(s): LIPASE, AMYLASE in the last 168 hours. No results for input(s): AMMONIA in the last 168 hours. CBC:  Recent Labs Lab 11/01/15 1532 11/01/15 1544  WBC 10.5  --   NEUTROABS 5.8  --   HGB 11.8* 12.9  HCT 36.2 38.0  MCV 86.6  --   PLT 295  --    Cardiac Enzymes: No results for input(s): CKTOTAL, CKMB, CKMBINDEX, TROPONINI in the last 168 hours.  BNP (last 3 results) No results for input(s): BNP in the last 8760 hours.  ProBNP (last 3 results) No results for input(s): PROBNP in the last 8760 hours.  CBG: No results for input(s): GLUCAP in the last 168 hours.  Radiological Exams on Admission: Ct Head Wo Contrast  11/01/2015  CLINICAL DATA:  TIA.  Slurred speech EXAM: CT HEAD WITHOUT CONTRAST TECHNIQUE: Contiguous axial images were obtained from the base of the skull through the vertex without intravenous contrast. COMPARISON:  CT and MRI 08/05/2015 FINDINGS: Chronic infarct right parietal lobe. No other areas of chronic ischemia Negative for acute infarct. Negative for acute hemorrhage. No mass or edema. Ventricle size normal.  No shift of the midline structures. Normal calvarium.  Mild mucosal edema in the paranasal sinuses. IMPRESSION: Chronic infarct right parietal cortex.  No acute abnormality. Electronically Signed   By: Franchot Gallo M.D.   On: 11/01/2015 16:49    EKG: Independently reviewed.  Abnormal findings:  Sinus rhythm, low-voltage QRS, incomplete LBBB   Assessment/Plan  1. Acute  left-sided paraesthesias, HA, and change in speech  - HA persists, other sxs resolved spontaneously over about 20 minutes  - Pt high-risk with HTN, DM2, HLD, recent embolic CVA  - Unable to exclude CVA/TIA at this time  - Appreciate neuro consultant input  - Monitor on telemetry  - MRI/MRA brain  -  Carotid dopplers  - Fasting lipids and A1c  - Continue ASA for secondary ppx  - PT/OT/SLP evals  2. Insulin-dependent DM  - A1c 10.9% on 09/02/15, reflecting poor control  - Taking glipizide 5 mg BID, metformin 1000 mg BID, and Novalog sliding scale at home  - Will hold home medications while inpt, check CBG with meals and qHS, implement moderate-intensity sliding scale correctional  - No basal coverage for now, will follow CBGs and adjust regimen prn  - Carb-modified diet when appropriate   3. Hypertension  - At goal on her home regimen, will continue here - Hydralazine, Maxzide, diltiazem  - Monitor, adjust prn   4. Major depressive disorder  - Stable, no SI/HI/hallucinations - Continue home-dose Prozac and Cymbalta with monitoring    5. HLD  - Continue Crestor 20 mg qHS  - Fasting lipid panel ordered    6. Alcohol dependency  - Denies Hx of withdrawal sxs or szr  - Will monitor on CIWA with PO Ativan prn only     DVT ppx:  SQ Lovenox     Code Status: Full code Family Communication: None at bed side.     Disposition Plan: Admit to inpatient   Date of Service 11/01/2015    Vianne Bulls, MD [facility] Pager [phone]  If 7PM-7AM, please contact night-coverage [email or web address] Password [email or web address] 11/01/2015, 8:21 PM

## 2015-11-01 NOTE — ED Notes (Signed)
PT made aware of bed assingment

## 2015-11-01 NOTE — ED Notes (Signed)
Pt. Coming from home via [facility] c/o stroke-like symptoms today around 1300. Pt. Had sudden  Onset of headache, change of speech, and left-sided tingling. Pt. Symptoms resolved on their own around 1320. Pt. Then called EMS. Pt. Hx of stroke in November and reports having no lasting deficits from that stroke. Pt. Describes pain as a tight rubber band around her head. Pt. Now experiencing bilateral leg weakness and some nausea. Pt. Aox4. Speech normal at this time.

## 2015-11-02 ENCOUNTER — Inpatient Hospital Stay (HOSPITAL_COMMUNITY): Payer: PRIVATE HEALTH INSURANCE

## 2015-11-02 ENCOUNTER — Inpatient Hospital Stay (HOSPITAL_BASED_OUTPATIENT_CLINIC_OR_DEPARTMENT_OTHER): Payer: PRIVATE HEALTH INSURANCE

## 2015-11-02 DIAGNOSIS — Z794 Long term (current) use of insulin: Secondary | ICD-10-CM

## 2015-11-02 DIAGNOSIS — F1020 Alcohol dependence, uncomplicated: Secondary | ICD-10-CM

## 2015-11-02 DIAGNOSIS — G459 Transient cerebral ischemic attack, unspecified: Secondary | ICD-10-CM

## 2015-11-02 DIAGNOSIS — F339 Major depressive disorder, recurrent, unspecified: Secondary | ICD-10-CM

## 2015-11-02 DIAGNOSIS — E1159 Type 2 diabetes mellitus with other circulatory complications: Secondary | ICD-10-CM

## 2015-11-02 DIAGNOSIS — E785 Hyperlipidemia, unspecified: Secondary | ICD-10-CM

## 2015-11-02 DIAGNOSIS — I10 Essential (primary) hypertension: Secondary | ICD-10-CM

## 2015-11-02 DIAGNOSIS — R29818 Other symptoms and signs involving the nervous system: Secondary | ICD-10-CM | POA: Diagnosis not present

## 2015-11-02 LAB — LIPID PANEL
CHOL/HDL RATIO: 5.6 ratio
Cholesterol: 200 mg/dL (ref 0–200)
HDL: 36 mg/dL — ABNORMAL LOW (ref >40)
LDL Cholesterol: 127 mg/dL — ABNORMAL HIGH (ref 0–99)
Triglycerides: 183 mg/dL — ABNORMAL HIGH (ref <150)
VLDL: 37 mg/dL (ref 0–40)

## 2015-11-02 LAB — GLUCOSE, CAPILLARY
GLUCOSE-CAPILLARY: 155 mg/dL — AB (ref 65–99)
GLUCOSE-CAPILLARY: 244 mg/dL — AB (ref 65–99)
Glucose-Capillary: 277 mg/dL — ABNORMAL HIGH (ref 65–99)

## 2015-11-02 MED ORDER — ROSUVASTATIN CALCIUM 40 MG PO TABS
40.0000 mg | ORAL_TABLET | Freq: Every evening | ORAL | Status: DC
  Administered 2015-11-02: 40 mg via ORAL
  Filled 2015-11-02: qty 1

## 2015-11-02 MED ORDER — ROSUVASTATIN CALCIUM 40 MG PO TABS: 40.0000 mg | ORAL_TABLET | Freq: Every evening | ORAL | Status: DC

## 2015-11-02 MED ORDER — THIAMINE HCL 100 MG PO TABS: 100.0000 mg | ORAL_TABLET | Freq: Every day | ORAL | Status: DC

## 2015-11-02 MED ORDER — FOLIC ACID 1 MG PO TABS
1.0000 mg | ORAL_TABLET | Freq: Every day | ORAL | Status: DC
Start: 2015-11-02 — End: 2017-05-22

## 2015-11-02 MED ORDER — CLOPIDOGREL BISULFATE 75 MG PO TABS: 75.0000 mg | ORAL_TABLET | Freq: Every day | ORAL | Status: DC

## 2015-11-02 MED ORDER — CLOPIDOGREL BISULFATE 75 MG PO TABS
75.0000 mg | ORAL_TABLET | Freq: Every day | ORAL | Status: DC
  Administered 2015-11-02: 75 mg via ORAL
  Filled 2015-11-02: qty 1

## 2015-11-02 MED ORDER — ADULT MULTIVITAMIN W/MINERALS CH: 1.0000 | ORAL_TABLET | Freq: Every day | ORAL | Status: DC

## 2015-11-02 NOTE — Evaluation (Signed)
Physical Therapy Evaluation Patient Details Name: Nakesia Mcnees MRN: [id number] DOB: November 15, 1955 Today's Date: 11/02/2015   History of Present Illness  Patient is a 60 y/o female with hx of HLD, DM, CVA presents with headache, speech changes and left-sided paraesthesias. Head CT and MRI-unremarkable. Workup pending. Concern for TIA.  Clinical Impression  Patient presents with mild balance deficits putting pt at risk for falls especially during higher level balance activities. Pt reports all other symptoms have resolved. Tolerated ambulation with Min guard assist for safety due to 2 episodes of scissoring-type gait. Tolerated stair training today. Discussed relaxation/stress relieving activities/techniques to help at home. Will follow acutely to maximize independence and mobility prior to return home.    Follow Up Recommendations No PT follow up;Supervision - Intermittent    Equipment Recommendations  None recommended by PT    Recommendations for Other Services       Precautions / Restrictions Precautions Precautions: Fall Restrictions Weight Bearing Restrictions: No      Mobility  Bed Mobility Overal bed mobility: Modified Independent                Transfers Overall transfer level: Needs assistance Equipment used: None Transfers: Sit to/from Stand Sit to Stand: Supervision         General transfer comment: Supervision for safety. Pt sleepy due to being woke up.  Ambulation/Gait Ambulation/Gait assistance: Min guard;Supervision Ambulation Distance (Feet): 200 Feet Assistive device: None Gait Pattern/deviations: Step-through pattern;Decreased stride length;Scissoring   Gait velocity interpretation: Below normal speed for age/gender General Gait Details: Slow, steady gait initially but towards end of ambulation pt with 2 episodes of scrissoring gait (which pt reports happens sometimes). No dizziness.   Stairs Stairs: Yes Stairs assistance: Min guard Stair  Management: One rail Right;Alternating pattern Number of Stairs: 13 General stair comments: Cues for safety/technique.  Wheelchair Mobility    Modified Rankin (Stroke Patients Only) Modified Rankin (Stroke Patients Only) Pre-Morbid Rankin Score: No significant disability Modified Rankin: Moderately severe disability     Balance Overall balance assessment: Needs assistance Sitting-balance support: Feet supported;No upper extremity supported Sitting balance-Leahy Scale: Good     Standing balance support: During functional activity Standing balance-Leahy Scale: Fair                               Pertinent Vitals/Pain Pain Assessment: No/denies pain    Home Living Family/patient expects to be discharged to:: Private residence Living Arrangements: Children (2 y/o, 15 y/o) Available Help at Discharge: Family;Available PRN/intermittently Type of Home: House Home Access: Level entry     Home Layout: Two level;Bed/bath upstairs Home Equipment: None      Prior Function Level of Independence: Independent         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     Hand Dominance   Dominant Hand: Right    Extremity/Trunk Assessment   Upper Extremity Assessment: Defer to OT evaluation           Lower Extremity Assessment: Overall WFL for tasks assessed      Cervical / Trunk Assessment: Normal  Communication   Communication: No difficulties  Cognition Arousal/Alertness: Awake/alert Behavior During Therapy: WFL for tasks assessed/performed Overall Cognitive Status: Within Functional Limits for tasks assessed                      General Comments General comments (skin integrity, edema, etc.): Lengthy discussion with pt about stress  relieving techniques and relaxation techniques as pt reports high stress levels at home with screaming kids. Relates these episodes of stroke like symptoms every time she is over stressed and yelling at kids.     Exercises        Assessment/Plan    PT Assessment Patient needs continued PT services  PT Diagnosis Difficulty walking   PT Problem List Decreased balance;Decreased mobility;Decreased safety awareness;Decreased activity tolerance  PT Treatment Interventions Balance training;Gait training;Stair training;Therapeutic activities;Therapeutic exercise;Functional mobility training;Patient/family education;Neuromuscular re-education   PT Goals (Current goals can be found in the Care Plan section) Acute Rehab PT Goals Patient Stated Goal: to go home PT Goal Formulation: With patient Time For Goal Achievement: 11/16/15 Potential to Achieve Goals: Good    Frequency Min 3X/week   Barriers to discharge Inaccessible home environment 1 flight of steps    Co-evaluation               End of Session Equipment Utilized During Treatment: Gait belt Activity Tolerance: Patient tolerated treatment well Patient left: in bed;with call bell/phone within reach Nurse Communication: Mobility status         Time: [id number] PT Time Calculation (min) (ACUTE ONLY): 20 min   Charges:   PT Evaluation $PT Eval Moderate Complexity: 1 Procedure     PT G Codes:        Jourdin Connors A Lyman Balingit 11/02/2015, 1:03 PM Wray Kearns, [place], DPT [phone]

## 2015-11-02 NOTE — Care Management Note (Signed)
Case Management Note  Patient Details  Name: Dianelys Gengler MRN: [id number] Date of Birth: February 24, 1956  Subjective/Objective:                    Action/Plan: Patient presented to the ED with left facial numbness, slurred speech and headache.  Plan is to discharge home today. No discharge needs identified. Expected Discharge Date:                  Expected Discharge Plan:     In-House Referral:     Discharge planning Services     Post Acute Care Choice:    Choice offered to:     DME Arranged:    DME Agency:     HH Arranged:    [place] Agency:     Status of Service:  Completed, signed off  Medicare Important Message Given:    Date Medicare IM Given:    Medicare IM give by:    Date Additional Medicare IM Given:    Additional Medicare Important Message give by:     If discussed at [place] of Stay Meetings, dates discussed:    Additional Comments:  Rolm Baptise, RN 11/02/2015, 1:39 PM [phone]

## 2015-11-02 NOTE — Evaluation (Signed)
Speech Language Pathology Evaluation Patient Details Name: Lisa Anderson MRN: [id number] DOB: 1956/09/10 Today's Date: 11/02/2015 Time: [id number] SLP Time Calculation (min) (ACUTE ONLY): 23 min  Problem List:  Patient Active Problem List   Diagnosis Date Noted  . TIA (transient ischemic attack) 11/01/2015  . Chronic diastolic CHF (congestive heart failure) ([place]) 11/01/2015  . Acute focal neurological deficit 11/01/2015  . Type 2 diabetes mellitus with circulatory disorder ([place])   . HLD (hyperlipidemia)   . Palpitations   . Stroke ([place]) 08/05/2015  . Hypertension 08/05/2015  . Stroke due to embolism of right posterior cerebral artery ([place]) 08/05/2015  . Diabetes mellitus 08/05/2015  . Alcohol dependency ([place]) 08/10/2013  . Cocaine abuse 08/08/2013  . Alcohol abuse 08/08/2013  . MDD (major depressive disorder) ([place]) 08/08/2013   Past Medical History:  Past Medical History  Diagnosis Date  . Hypertension   . Diabetes mellitus   . Hyperlipidemia   . Asthma   . Cancer [facility])    Past Surgical History:  Past Surgical History  Procedure Laterality Date  . Tonsillectomy    . Breast lumpectomy    . Ep implantable device N/A 08/07/2015    Procedure: Loop Recorder Insertion;  Surgeon: Thompson Grayer, MD;  Location: [facility] CV LAB;  Service: Cardiovascular;  Laterality: N/A;  . Tee without cardioversion N/A 08/07/2015    Procedure: TRANSESOPHAGEAL ECHOCARDIOGRAM (TEE);  Surgeon: Sueanne Margarita, MD;  Location: [facility] ENDOSCOPY;  Service: Cardiovascular;  Laterality: N/A;   HPI:  Lisa Anderson is a 60 y.o. female with PMH of insulin-dependent diabetes mellitus, hypertension, cocaine abuse in remission, and recent embolic stroke who presents to the ED with headache, change in speech, and left-sided paresthesias, onset 1 PM. Patient was admitted to this institution in November 2016 with acute strokelike symptoms and found to have right parietal embolic CVA, source of embolism undetermined  after extensive workup including TEE and event monitoring. Patient reports that there are no residual deficits from that infarct and she has been in her usual state of health. She denies any recent fever, chills, nausea, vomiting, diarrhea, cough, or dysuria. She denies any illicit drug use. She was at rest this afternoon at approximately 1 PM when she developed acute onset headache that she describes as moderate in severity, "tightness" in character, and in a bandlike distribution around her head. She is unable to identify any alleviating or exacerbating factors but states that the intensity has lessened since time of onset. She describes an associated change in her speech but she is unable to further characterize. She also notes tingling throughout the left side of her body and face. She denies confusion, loss of coordination, or focal weakness.  Current MRI is negative for acute findings.     Assessment / Plan / Recommendation Clinical Impression  Cognitive/linguistic evaluation was completed using the Mini Mental State Exam.  Of note, the patient was admitted in November 2016 with a CVA and at that time had issues with recall.  Today the patient scored a 28/30 on the Mini Mental Exam.  The patient struggled to recall 3 novel words given a delay and to copy a design.  Long discussion regarding strategies to facilitate recall such as writing things down and rehearsal.  ST follow up is not indicated at this time.  The patient was encouraged to follow up with her primary care doctor if she desired ST to address memory issues.      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  ) Awareness: Appears intact Safety/Judgment: Appears intact    Comprehension  Auditory Comprehension Overall Auditory Comprehension: Appears within functional limits for tasks assessed Yes/No Questions: Within Functional Limits Commands: Within Functional Limits Conversation: Complex Reading Comprehension Reading Status: Within funtional limits    Expression Expression Primary Mode of Expression: Verbal Verbal Expression Overall Verbal Expression: Appears within functional limits for tasks assessed Initiation: No impairment Automatic Speech: Name;Social Response Level of Generative/Spontaneous Verbalization: Conversation Repetition: No impairment Naming: No impairment Pragmatics: No impairment Non-Verbal Means of Communication: Not applicable Written Expression Dominant Hand: Right Written Expression: Within Functional Limits   Oral / Motor  Oral Motor/Sensory Function Overall Oral Motor/Sensory Function: Within functional limits Motor Speech Overall Motor Speech: Appears within functional limits for tasks assessed Respiration: Within functional limits Phonation: Normal Resonance: Within functional limits Articulation: Within functional limitis Intelligibility: Intelligible Motor Planning: Witnin functional limits Motor Speech Errors: Not applicable   GO                   Lisa Flatten, MA, CCC-SLP Acute Rehab SLP [phone] Lisa Anderson N 11/02/2015, 9:00 AM

## 2015-11-02 NOTE — Progress Notes (Addendum)
Discharged order received. Alert and oriented. Discharge summary discussed with pt with verbalized understanding. All belongings send home with pt. D/C IV line. Wheel pt in wheelchair. Home with dtr.

## 2015-11-02 NOTE — Progress Notes (Signed)
Occupational Therapy Treatment Patient Details Name: Lisa Anderson MRN: [id number] DOB: 09-17-1956 Today's Date: 11/02/2015    History of present illness Patient is a 60 y.o. female with hx of HLD, recent embolic CVA, cocaine abuse in remission, cancer, asthma, DM, CVA who presented with headache, speech changes and left-sided paraesthesias. Head CT and MRI-unremarkable.   OT comments  Education provided in session. Pt may d/c home today.  Follow Up Recommendations  No OT follow up;Supervision - Intermittent    Equipment Recommendations  None recommended by OT    Recommendations for Other Services      Precautions / Restrictions Precautions Precautions: Fall Restrictions Weight Bearing Restrictions: No       Mobility Bed Mobility Overal bed mobility: Modified Independent                Transfers Overall transfer level: Needs Assistance Transfers: Sit to/from Stand Sit to Stand: supervision            Balance Pt unsteady on feet.                  ADL Overall ADL's : Needs assistance/impaired               Toilet Transfer: Supervision/safety;Ambulation;Regular Toilet   Toileting- Water quality scientist and Hygiene: Supervision/safety;Sit to/from stand   Tub/ Shower Transfer: Tub transfer;Min guard;Ambulation   Functional mobility during ADLs:  (supervision for ambulation and Min guard for tub transfer) General ADL Comments: Educated on safety such as rugs/items on floor, safe footwear, sitting for LB ADLs, and recommended daughter be with her for tub transfer. Educated on memory strategies and wrote down some ideas for pt such as writing things down and using pill box and setting an alarm. Practiced simulated tub transfer. Educated on BE FAST stroke education and advised pt to stop smoking and to avoid canned foods-suggest frozen veggies. Wrote down some strategies to help pt with memory, safety tips for home, and stroke education.      Vision                      Perception     Praxis      Cognition  Lethargic Behavior During Therapy: [facility] for tasks assessed/performed Overall Cognitive Status: Within Functional Limits for tasks assessed (reported today that she has been having memory issues, so OT educated on strategies to help with this)              Exercises Other Exercises Other Exercises: educated on UE exercises and pt performed in session. Used level 2 theraband.   Shoulder Instructions       General Comments      Pertinent Vitals/ Pain       Pain Assessment: No/denies pain  Home Living Family/patient expects to be discharged to:: Private residence Living Arrangements: Children (and grandchildren) Available Help at Discharge: Family;Available PRN/intermittently Type of Home: House Home Access: Level entry     Home Layout: Two level;Bed/bath upstairs Alternate Level Stairs-Number of Steps: 1 flight Alternate Level Stairs-Rails: Right Bathroom Shower/Tub: Teacher, early years/pre: Standard     Home Equipment:  (pt thinks she has chair she can use as shower chair)          Prior Functioning/Environment Level of Independence: Independent        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   Frequency Min 2X/week     Progress Toward Goals  OT Goals(current goals can now be found  in the care plan section)  Progress towards OT goals: Progressing toward goals  Acute Rehab OT Goals Patient Stated Goal: not stated OT Goal Formulation: With patient Time For Goal Achievement: 11/09/15 Potential to Achieve Goals: Good ADL Goals Pt Will Perform Lower Body Dressing: with modified independence;sit to/from stand (including gathering items in low drawer) Pt Will Perform Tub/Shower Transfer: Tub transfer;with supervision;ambulating;with set-up;shower seat Additional ADL Goal #1: Pt will independently perform HEP for bilateral UEs to increase strength.  Plan Discharge plan remains  appropriate    Co-evaluation                 End of Session     Activity Tolerance Patient tolerated treatment well   Patient Left in bed;with bed alarm set;with call bell/phone within reach   Nurse Communication        Time: [id number] OT Time Calculation (min): 16 min  Charges:  OT General Charges $OT Visit: 1 Procedure OT Treatments $Self Care/Home Management : 8-22 mins  Benito Mccreedy OTR/L [id number] 11/02/2015, 4:42 PM

## 2015-11-02 NOTE — Progress Notes (Signed)
PROGRESS NOTE  Lisa Anderson [id number] DOB: Jan 20, 1956 DOA: 11/01/2015 PCP: [facility]  Assessment/Plan: Acute left-sided paraesthesias, HA, and change in speech  - HA persists, other sxs resolved spontaneously over about 20 minutes  - Pt high-risk with HTN, DM2, HLD, recent embolic CVA  MRI negative for acute CVA - Appreciate neuro consultant - Monitor on telemetry  -echo done 11/16 - Carotid dopplers  - Fasting lipids: LDL 127, HDL 36- on statin -- will increase dose - Continue ASA for secondary ppx  - PT/OT/SLP evals  Insulin-dependent DM  - A1c 10.9% on 09/02/15, reflecting poor control  - Taking glipizide 5 mg BID, metformin 1000 mg BID, and Novalog sliding scale at home  - Will hold home medications while inpt, check CBG with meals and qHS, implement moderate-intensity sliding scale correctional  - No basal coverage for now, will follow CBGs and adjust regimen prn  - Carb-modified diet when appropriate   Hypertension  - At goal on her home regimen, will continue here - Hydralazine, Maxzide, diltiazem  - Monitor, adjust prn   Major depressive disorder  - Stable, no SI/HI/hallucinations - Continue home-dose Prozac and Cymbalta with monitoring    HLD  -increase crestor - Fasting lipid panel as above  Alcohol dependency  - Denies Hx of withdrawal sxs or szr  - Will monitor on CIWA with PO Ativan prn only   Code Status: full Family Communication: patient Disposition Plan: await finished work up   Consultants:  neuro  Procedures:      HPI/Subjective: C/o being tired-- did not sleep well  Objective: Filed Vitals:   11/02/15 0700 11/02/15 0913  BP: 129/53 121/50  Pulse: 73 74  Temp: 98.6 F (37 C) 98.3 F (36.8 C)  Resp: 18 16    Intake/Output Summary (Last 24 hours) at 11/02/15 1013 Last data filed at 11/02/15 0914  Gross per 24 hour  Intake    240 ml  Output      0 ml  Net    240 ml   Filed  Weights   11/01/15 1426  Weight: 73.936 kg (163 lb)    Exam:   General:  Flat affect  Cardiovascular: rrr  Respiratory: clear  Abdomen: +BS, soft  Musculoskeletal: no edema   Data Reviewed: Basic Metabolic Panel:  Recent Labs Lab 11/01/15 1532 11/01/15 1544  NA 137 140  K 3.6 3.6  CL 98* 100*  CO2 24  --   GLUCOSE 137* 128*  BUN 16 17  CREATININE 1.04* 1.00  CALCIUM 9.3  --    Liver Function Tests:  Recent Labs Lab 11/01/15 1532  AST 18  ALT 13*  ALKPHOS 76  BILITOT 0.4  PROT 6.4*  ALBUMIN 3.5   No results for input(s): LIPASE, AMYLASE in the last 168 hours. No results for input(s): AMMONIA in the last 168 hours. CBC:  Recent Labs Lab 11/01/15 1532 11/01/15 1544  WBC 10.5  --   NEUTROABS 5.8  --   HGB 11.8* 12.9  HCT 36.2 38.0  MCV 86.6  --   PLT 295  --    Cardiac Enzymes: No results for input(s): CKTOTAL, CKMB, CKMBINDEX, TROPONINI in the last 168 hours. BNP (last 3 results) No results for input(s): BNP in the last 8760 hours.  ProBNP (last 3 results) No results for input(s): PROBNP in the last 8760 hours.  CBG:  Recent Labs Lab 11/01/15 2033 11/02/15 0659  GLUCAP 118* 155*    No results found for this  or any previous visit (from the past 240 hour(s)).   Studies: Ct Head Wo Contrast  11/01/2015  CLINICAL DATA:  TIA.  Slurred speech EXAM: CT HEAD WITHOUT CONTRAST TECHNIQUE: Contiguous axial images were obtained from the base of the skull through the vertex without intravenous contrast. COMPARISON:  CT and MRI 08/05/2015 FINDINGS: Chronic infarct right parietal lobe. No other areas of chronic ischemia Negative for acute infarct. Negative for acute hemorrhage. No mass or edema. Ventricle size normal.  No shift of the midline structures. Normal calvarium.  Mild mucosal edema in the paranasal sinuses. IMPRESSION: Chronic infarct right parietal cortex.  No acute abnormality. Electronically Signed   By: Franchot Gallo M.D.   On: 11/01/2015  16:49   Mr Brain Wo Contrast  11/02/2015  CLINICAL DATA:  60 year old diabetic hypertensive female with hyperlipidemia and cocaine abuse presenting with change in speech and left-sided paresthesias. Recent embolic stroke. Subsequent encounter. EXAM: MRI HEAD WITHOUT CONTRAST MRA HEAD WITHOUT CONTRAST TECHNIQUE: Multiplanar, multiecho pulse sequences of the brain and surrounding structures were obtained without intravenous contrast. Angiographic images of the head were obtained using MRA technique without contrast. COMPARISON:  11/01/2015 head CT.  08/05/2015 brain MR. FINDINGS: MRI HEAD FINDINGS No acute infarct or intracranial hemorrhage. Remote right parietal -occipital lobe infarct. Mild to moderate chronic small vessel disease changes. No hydrocephalus. No intracranial mass lesion noted on this unenhanced exam. Mild exophthalmos. Maxillary sinus and ethmoid sinus air cell mucosal thickening with polypoid opacification superior aspect left maxillary sinus. Polypoid opacification sphenoid sinus. Expanded partially empty sella without other findings to suggest pseudotumor cerebri MRA HEAD FINDINGS Moderate tandem stenosis left internal carotid artery pre cavernous/ cavernous segment. Mild stenosis cavernous and supraclinoid segment of the right internal carotid artery. Moderate stenosis M1 segment right middle cerebral artery. Marked stenosis proximal M2 segment right middle cerebral artery. Decrease number of visualized right middle cerebral artery branches consistent with patient's remote infarct. 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 Fetal type contribution to the left posterior cerebral artery. Marked focal stenosis distal right vertebral artery. Mild narrowing of the right vertebral artery proximal to the takeoff of the right posterior inferior cerebellar artery. Mild narrowing and irregularity of portions of the right posterior inferior cerebellar  artery. Nonvisualized left posterior inferior cerebellar artery. Mild narrowing proximal basilar artery. Moderate focal stenosis proximal and mid aspect of the posterior cerebellar artery more notable on the right. Mild narrowing irregularity posterior cerebral artery distal branches. IMPRESSION: MRI HEAD No acute infarct or intracranial hemorrhage. Remote right parietal -occipital lobe infarct. Mild to moderate chronic small vessel disease changes. Maxillary sinus and ethmoid sinus air cell mucosal thickening with polypoid opacification superior aspect left maxillary sinus. Polypoid opacification sphenoid sinus. Expanded partially empty sella. MRA HEAD Moderate tandem stenosis left internal carotid artery pre cavernous/ cavernous segment. Mild stenosis cavernous and supraclinoid segment of the right internal carotid artery. Moderate stenosis M1 segment right middle cerebral artery. Marked stenosis proximal M2 segment right middle cerebral artery. Decrease number of visualized right middle cerebral artery branches consistent with patient's remote infarct. 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 Nonvisualized left posterior inferior cerebellar artery. Mild narrowing proximal basilar artery. Moderate focal stenosis proximal and mid aspect of the posterior cerebellar artery more notable on the right. Mild narrowing irregularity posterior cerebral artery distal branches. Electronically Signed   By: Genia Del M.D.   On: 11/02/2015 07:03   Mr [name] Head/brain  Wo Cm  11/02/2015  CLINICAL DATA:  60 year old diabetic hypertensive female with hyperlipidemia and cocaine abuse presenting with change in speech and left-sided paresthesias. Recent embolic stroke. Subsequent encounter. EXAM: MRI HEAD WITHOUT CONTRAST MRA HEAD WITHOUT CONTRAST TECHNIQUE: Multiplanar, multiecho pulse sequences of the brain and surrounding  structures were obtained without intravenous contrast. Angiographic images of the head were obtained using MRA technique without contrast. COMPARISON:  11/01/2015 head CT.  08/05/2015 brain MR. FINDINGS: MRI HEAD FINDINGS No acute infarct or intracranial hemorrhage. Remote right parietal -occipital lobe infarct. Mild to moderate chronic small vessel disease changes. No hydrocephalus. No intracranial mass lesion noted on this unenhanced exam. Mild exophthalmos. Maxillary sinus and ethmoid sinus air cell mucosal thickening with polypoid opacification superior aspect left maxillary sinus. Polypoid opacification sphenoid sinus. Expanded partially empty sella without other findings to suggest pseudotumor cerebri MRA HEAD FINDINGS Moderate tandem stenosis left internal carotid artery pre cavernous/ cavernous segment. Mild stenosis cavernous and supraclinoid segment of the right internal carotid artery. Moderate stenosis M1 segment right middle cerebral artery. Marked stenosis proximal M2 segment right middle cerebral artery. Decrease number of visualized right middle cerebral artery branches consistent with patient's remote infarct. 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 Fetal type contribution to the left posterior cerebral artery. Marked focal stenosis distal right vertebral artery. Mild narrowing of the right vertebral artery proximal to the takeoff of the right posterior inferior cerebellar artery. Mild narrowing and irregularity of portions of the right posterior inferior cerebellar artery. Nonvisualized left posterior inferior cerebellar artery. Mild narrowing proximal basilar artery. Moderate focal stenosis proximal and mid aspect of the posterior cerebellar artery more notable on the right. Mild narrowing irregularity posterior cerebral artery distal branches. IMPRESSION: MRI HEAD No acute infarct or intracranial hemorrhage. Remote right parietal -occipital lobe  infarct. Mild to moderate chronic small vessel disease changes. Maxillary sinus and ethmoid sinus air cell mucosal thickening with polypoid opacification superior aspect left maxillary sinus. Polypoid opacification sphenoid sinus. Expanded partially empty sella. MRA HEAD Moderate tandem stenosis left internal carotid artery pre cavernous/ cavernous segment. Mild stenosis cavernous and supraclinoid segment of the right internal carotid artery. Moderate stenosis M1 segment right middle cerebral artery. Marked stenosis proximal M2 segment right middle cerebral artery. Decrease number of visualized right middle cerebral artery branches consistent with patient's remote infarct. 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 Nonvisualized left posterior inferior cerebellar artery. Mild narrowing proximal basilar artery. Moderate focal stenosis proximal and mid aspect of the posterior cerebellar artery more notable on the right. Mild narrowing irregularity posterior cerebral artery distal branches. Electronically Signed   By: Genia Del M.D.   On: 11/02/2015 07:03    Scheduled Meds: . aspirin  325 mg Oral Daily  . diltiazem  240 mg Oral q morning - 10a  . DULoxetine  30 mg Oral Daily  . enoxaparin (LOVENOX) injection  40 mg Subcutaneous Q24H  . FLUoxetine  20 mg Oral Daily  . folic acid  1 mg Oral Daily  . hydrALAZINE  50 mg Oral TID  . insulin aspart  0-15 Units Subcutaneous TID WC  . multivitamin with minerals  1 tablet Oral Daily  . rosuvastatin  20 mg Oral QPM  . thiamine  100 mg Oral Daily   Or  . thiamine  100 mg Intravenous Daily  . triamterene-hydrochlorothiazide  1 tablet Oral Daily   Continuous Infusions:  Antibiotics Given (last 72 hours)  None      Principal Problem:   Acute focal neurological deficit Active Problems:   MDD (major depressive disorder) (HCC)   Alcohol dependency  ([place])   Hypertension   Type 2 diabetes mellitus with circulatory disorder (HCC)   HLD (hyperlipidemia)   Chronic diastolic CHF (congestive heart failure) ([place])    Time spent: 25 min    [facility] Pager [phone]. If 7PM-7AM, please contact night-coverage at [email or web address], password [email or web address] 11/02/2015, 10:13 AM  LOS: 1 day

## 2015-11-02 NOTE — Progress Notes (Signed)
STROKE TEAM PROGRESS NOTE   HISTORY OF PRESENT ILLNESS Lisa Anderson is an 60 y.o. female history of hypertension, hyperlipidemia, diabetes mellitus and cerebral infarction in November 2016, presenting with transient numbness involving left side of her face as well as slurred speech. Symptoms onset was at 1:00 PM today 11/01/2015 ([facility]), and symptoms lasted 20 minutes then resolved. She had a feeling of tightness or pressure in her head as well at the same time. She had similar symptoms with the onset of her stroke in November 2016. She's been taking aspirin daily. CT scan of her head showed no acute intracranial abnormality. Chronic right parietal cortical infarction was noted. NIH stroke score was 0 at the time of this evaluation.  Patient was not administered TPA secondary to resolved deficits. She was admitted for further evaluation and treatment.   SUBJECTIVE (INTERVAL HISTORY) No family is at the bedside. Patient wants her disability papers signed. Patient wants her SSRI changed.   OBJECTIVE Temp:  [97.8 F (36.6 C)-98.6 F (37 C)] 98.3 F (36.8 C) (02/06 0913) Pulse Rate:  [65-77] 74 (02/06 0913) Cardiac Rhythm:  [-] Normal sinus rhythm (02/06 0700) Resp:  [13-20] 16 (02/06 0913) BP: (121-165)/(50-79) 121/50 mmHg (02/06 0913) SpO2:  [95 %-100 %] 97 % (02/06 0913) Weight:  [73.936 kg (163 lb)] 73.936 kg (163 lb) (02/05 1426)  CBC:   Recent Labs Lab 11/01/15 1532 11/01/15 1544  WBC 10.5  --   NEUTROABS 5.8  --   HGB 11.8* 12.9  HCT 36.2 38.0  MCV 86.6  --   PLT 295  --     Basic Metabolic Panel:   Recent Labs Lab 11/01/15 1532 11/01/15 1544  NA 137 140  K 3.6 3.6  CL 98* 100*  CO2 24  --   GLUCOSE 137* 128*  BUN 16 17  CREATININE 1.04* 1.00  CALCIUM 9.3  --     Lipid Panel:     Component Value Date/Time   CHOL 200 11/02/2015 0509   TRIG 183* 11/02/2015 0509   HDL 36* 11/02/2015 0509   CHOLHDL 5.6 11/02/2015 0509   VLDL 37 11/02/2015 0509   LDLCALC  127* 11/02/2015 0509   HgbA1c:  Lab Results  Component Value Date   HGBA1C 10.90 09/02/2015   Urine Drug Screen:     Component Value Date/Time   LABOPIA NONE DETECTED 11/01/2015 1536   COCAINSCRNUR NONE DETECTED 11/01/2015 1536   LABBENZ NONE DETECTED 11/01/2015 1536   AMPHETMU NONE DETECTED 11/01/2015 1536   THCU NONE DETECTED 11/01/2015 1536   LABBARB NONE DETECTED 11/01/2015 1536      IMAGING  Ct Head Wo Contrast 11/01/2015   Chronic infarct right parietal cortex.  No acute abnormality.   MRI HEAD  11/02/2015   No acute infarct or intracranial hemorrhage. Remote right parietal -occipital lobe infarct. Mild to moderate chronic small vessel disease changes. Maxillary sinus and ethmoid sinus air cell mucosal thickening with polypoid opacification superior aspect left maxillary sinus. Polypoid opacification sphenoid sinus. Expanded partially empty sella.   MRA HEAD  11/02/2015   Moderate tandem stenosis left internal carotid artery pre cavernous/ cavernous segment. Mild stenosis cavernous and supraclinoid segment of the right internal carotid artery. Moderate stenosis M1 segment right middle cerebral artery. Marked stenosis proximal M2 segment right middle cerebral artery. Decrease number of visualized right middle cerebral artery branches consistent with patient's remote infarct. 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 Nonvisualized left posterior  inferior cerebellar artery. Mild narrowing proximal basilar artery. Moderate focal stenosis proximal and mid aspect of the posterior cerebellar artery more notable on the right. Mild narrowing irregularity posterior cerebral artery distal branches.   Carotid Doppler   There is 1-39% bilateral ICA stenosis. Vertebral artery flow is antegrade.     PHYSICAL EXAM Pleasant middle-aged African-American lady currently not in distress. . Afebrile.  Head is nontraumatic. Neck is supple without bruit.    Cardiac exam no murmur or gallop. Lungs are clear to auscultation. Distal pulses are well felt. Neurological Exam :  Awake alert oriented 3 normal speech and language function. Extraocular movements are pharyngeal or nystagmus. Pupils are equal reactive. Fundi were not visualized. Blinks to threat bilaterally. Mild right lower facial weakness. Tongue midline. Motor system exam reveals no upper extremity drift but mild weakness of right grip and intrinsic hand muscles. Mild weakness of right hip flexors and ankle dorsiflexors. Right lower extremity drift. Normal strength in the left side. Sensation appears diminished in the right hemibody. Plantars are downgoing. Coordination is slow but accurate. Gait was not tested. ASSESSMENT/PLAN Lisa Anderson is a 60 y.o. female with history of hypertension, hyperlipidemia, diabetes and previous stroke in November 2016, presenting with transient left facial numbness and slurred speech. She did not receive IV t-PA due to resolved symptoms.   Transient left facial numbness and slurred speech, etiology unclear, Possible right brain TIA  MRI  No acute stroke  MRA  Diffuse intracranial stenosis through the brain  Carotid Doppler  No carotid stenosis  2D Echo  Last completed 08/06/2015 without source of embolus  TEE 08/07/2015  Loop recorder placed 08/07/2015, have requested interrogation  LDL 127  HgbA1c 10.9 in Dec  Lovenox 40 mg sq daily for VTE prophylaxis Diet Carb Modified Fluid consistency:: Thin; Room service appropriate?: Yes  aspirin 325 mg daily prior to admission, now on clopidogrel 75 mg daily  Patient counseled to be compliant with her antithrombotic medications  Ongoing aggressive stroke risk factor management  Therapy recommendations:  No therapy needs  Disposition:  Returning home  Hypertension  Stable  Hyperlipidemia  Home meds:  Crestor 20, resumed in  hospital  LDL 127, goal < 70  Continue statin at discharge  Diabetes  HgbA1c 10.9 in December, goal < 7.0  Uncontrolled  Other Stroke Risk Factors  Cigarette smoker, advised to stop smoking  Hx ETOH use  History of cocaine use, negative this admission.  Hx stroke/TIA  07/2015 R [place]-occipital cortical infarct embolic secondary to unknown source, loop recorder placed  Family hx stroke (brother)  Other Active Problems  Major depressive disorder  NOTHING FURTHER TO ADD FROM THE STROKE STANDPOINT  Patient has a 10-15% risk of having another stroke over the next year, the highest risk is within 2 weeks of the most recent stroke/TIA (risk of having a stroke following a stroke or TIA is the same).  Ongoing risk factor control by Primary Care Physician  Stroke Service will sign off. Please call should any needs arise.  Follow-up Stroke Clinic at [facility] with Dr. Jaynee Eagles, in 2 months, order placed. (she has seen her in the past)  Hospital day # [place] for Pager information 11/02/2015 4:03 PM  I have personally examined this patient, reviewed notes, independently viewed imaging studies, participated in medical decision making and plan of care. I have made any additions or clarifications directly to the above note. Agree with note above.  She presented with transient slurred speech and facial numbness possibly from a TIA. MRI does not show an acute infarct. Patient remains at risk for neurological worsening, recurrent stroke, TIA. Recommend she stay on Plavix for stroke prevention and maintain aggressive risk factor control.  Antony Contras, MD Medical Director [facility] Pager: [phone] 11/02/2015 4:59 PM    To contact Stroke Continuity provider, please refer to [email or web address] After hours, contact General Neurology

## 2015-11-02 NOTE — Discharge Summary (Signed)
Physician Discharge Summary  Lisa Anderson [id number] DOB: Feb 21, 1956 DOA: 11/01/2015  PCP: [place] date: 11/01/2015 Discharge date: 11/02/2015  Time spent: 25 minutes  Recommendations for Outpatient Follow-up:  HgbA1C follow lab result-- needs better blood sugar control Stop alcohol  Discharge Diagnoses:  Principal Problem:   Acute focal neurological deficit Active Problems:   MDD (major depressive disorder) ([place])   Alcohol dependency ([place])   Hypertension   Type 2 diabetes mellitus with circulatory disorder ([place])   HLD (hyperlipidemia)   Chronic diastolic CHF (congestive heart failure) ([place])   Discharge Condition: improved  Diet recommendation: carb/cardiac  Filed Weights   11/01/15 1426  Weight: 73.936 kg (163 lb)    History of present illness:  Lisa Anderson is a 60 y.o. female with PMH of insulin-dependent diabetes mellitus, hypertension, cocaine abuse in remission, and recent embolic stroke who presents to the ED with headache, change in speech, and left-sided paresthesias, onset 1 PM. Patient was admitted to this institution in November 2016 with acute strokelike symptoms and found to have right parietal embolic CVA, source of embolism undetermined after extensive workup including TEE and event monitoring. Patient reports that there are no residual deficits from that infarct and she has been in her usual state of health. She denies any recent fever, chills, nausea, vomiting, diarrhea, cough, or dysuria. She denies any illicit drug use. She was at rest this afternoon at approximately 1 PM when she developed acute onset headache that she describes as moderate in severity, "tightness" in character, and in a bandlike distribution around her head. She is unable to identify any alleviating or exacerbating factors but states that the intensity has lessened since time of onset. She describes an associated change in her speech but she is unable to further  characterize. She also notes tingling throughout the left side of her body and face. She denies confusion, loss of coordination, or focal weakness.  In ED, patient was found to be afebrile, saturating well on room air, and with vital signs stable. EKG demonstrated sinus rhythm and noncontrast head CT was negative for any acute intracranial abnormality. Initial blood work including chem panel and CBC is largely unremarkable. Urine drug screen is negative and urinalysis is not concerning for infection. Patient reports that paresthesias and speech difficulty resolved spontaneously after about 20 minutes, but reports persistence of her headache. Dr. Nicole Kindred of neurology was consulted from the ED and kindly came down to evaluate the patient. Per his recommendation, she will be admitted to the telemetry unit for ongoing evaluation and management of potential TIA/CVA.  Hospital Course:  Acute left-sided paraesthesias, HA, and change in speech  - HA persists, other sxs resolved spontaneously over about 20 minutes  - Pt high-risk with HTN, DM2, HLD, recent embolic CVA  MRI negative for acute CVA - Appreciate neuro consultant -echo done 11/16 - Fasting lipids: LDL 127, HDL 36- on statin -- will increase dose - asa changed to plavix - PT/OT/SLP evals- no follow up  Insulin-dependent DM  - A1c 10.9% on 09/02/15, reflecting poor control  - Taking glipizide 5 mg BID, metformin 1000 mg BID, and Novalog sliding scale at home  - Will hold home medications while inpt, check CBG with meals and qHS, implement moderate-intensity sliding scale correctional  - No basal coverage for now, will follow CBGs and adjust regimen prn  - Carb-modified diet when appropriate   Hypertension  - At goal on her home regimen, will continue here - Hydralazine, Maxzide,  diltiazem  - Monitor, adjust prn   Major depressive disorder  - Stable, no SI/HI/hallucinations - Continue home-dose Prozac and Cymbalta with  monitoring   HLD  -increase crestor - Fasting lipid panel as above  Alcohol dependency  - Denies Hx of withdrawal sxs or szr     Procedures:    Consultations:  neuro  Discharge Exam: Filed Vitals:   11/02/15 0700 11/02/15 0913  BP: 129/53 121/50  Pulse: 73 74  Temp: 98.6 F (37 C) 98.3 F (36.8 C)  Resp: 18 16     Discharge Instructions   Discharge Instructions    Diet - low sodium heart healthy    Complete by:  As directed      Diet Carb Modified    Complete by:  As directed      Discharge instructions    Complete by:  As directed   Avoid alcohol     Increase activity slowly    Complete by:  As directed           Current Discharge Medication List    START taking these medications   Details  clopidogrel (PLAVIX) 75 MG tablet Take 1 tablet (75 mg total) by mouth daily. Qty: 30 tablet, Refills: 0    folic acid (FOLVITE) 1 MG tablet Take 1 tablet (1 mg total) by mouth daily.    Multiple Vitamin (MULTIVITAMIN WITH MINERALS) TABS tablet Take 1 tablet by mouth daily.    thiamine 100 MG tablet Take 1 tablet (100 mg total) by mouth daily. Qty: 30 tablet, Refills: 0      CONTINUE these medications which have CHANGED   Details  rosuvastatin (CRESTOR) 40 MG tablet Take 1 tablet (40 mg total) by mouth every evening. Qty: 30 tablet, Refills: 0      CONTINUE these medications which have NOT CHANGED   Details  albuterol (PROVENTIL HFA;VENTOLIN HFA) 108 (90 BASE) MCG/ACT inhaler Inhale 2 puffs into the lungs every 6 (six) hours as needed for wheezing or shortness of breath. Shortness of breath Qty: 1 Inhaler, Refills: 2    diltiazem (DILACOR XR) 240 MG 24 hr capsule Take 1 capsule (240 mg total) by mouth every morning. Qty: 30 capsule, Refills: 3    DULoxetine (CYMBALTA) 30 MG capsule Take 1 capsule (30 mg total) by mouth daily. Qty: 30 capsule, Refills: 0    FLUoxetine (PROZAC) 20 MG capsule Take 20 mg by mouth daily.    glipiZIDE (GLUCOTROL) 5  MG tablet Take 1 tablet (5 mg total) by mouth 2 (two) times daily before a meal. Qty: 60 tablet, Refills: 3    hydrALAZINE (APRESOLINE) 50 MG tablet Take 1 tablet (50 mg total) by mouth 3 (three) times daily. Qty: 90 tablet, Refills: 3    ibuprofen (ADVIL,MOTRIN) 200 MG tablet Take 200 mg by mouth every 6 (six) hours as needed for headache.    insulin aspart (NOVOLOG) 100 UNIT/ML injection Sliding scale CBG 70 - 120: 0 units CBG 121 - 150: 1 unit,  CBG 151 - 200: 2 units,  CBG 201 - 250: 3 units,  CBG 251 - 300: 5 units,  CBG 301 - 350: 7 units,  CBG 351 - 400: 9 units   CBG > 400: 9 units and notify your MD Qty: 1 vial, Refills: 12    metFORMIN (GLUCOPHAGE) 1000 MG tablet Take 1 tablet (1,000 mg total) by mouth 2 (two) times daily with a meal. Qty: 60 tablet, Refills: 3    triamterene-hydrochlorothiazide (MAXZIDE-25) 37.5-25  MG tablet Take 1 tablet by mouth daily. Qty: 30 tablet, Refills: 3      STOP taking these medications     aspirin 325 MG tablet        Allergies  Allergen Reactions  . Prednisone Hives and Other (See Comments)    Made her "crazy", suicidal  . Benicar [Olmesartan] Other (See Comments)    Did not work at all for patient  . Celexa [Citalopram] Other (See Comments)    Made patient feel crazy, fluoxetine is fine  . Effexor [Venlafaxine] Other (See Comments)    Made patient feel crazy  . Shrimp [Shellfish Allergy] Hives and Swelling   Follow-up Information    Follow up with [facility] In 1 week.   Specialty:  General Practice   Contact information:   [place] [address] [phone]       Follow up with SETHI,PRAMOD, MD In 2 months.   Specialties:  Neurology, Radiology   Contact information:   [address] [place] [address] [phone]        The results of significant diagnostics from this hospitalization (including imaging, microbiology, ancillary and laboratory) are listed below for  reference.    Significant Diagnostic Studies: Ct Head Wo Contrast  11/01/2015  CLINICAL DATA:  TIA.  Slurred speech EXAM: CT HEAD WITHOUT CONTRAST TECHNIQUE: Contiguous axial images were obtained from the base of the skull through the vertex without intravenous contrast. COMPARISON:  CT and MRI 08/05/2015 FINDINGS: Chronic infarct right parietal lobe. No other areas of chronic ischemia Negative for acute infarct. Negative for acute hemorrhage. No mass or edema. Ventricle size normal.  No shift of the midline structures. Normal calvarium.  Mild mucosal edema in the paranasal sinuses. IMPRESSION: Chronic infarct right parietal cortex.  No acute abnormality. Electronically Signed   By: Franchot Gallo M.D.   On: 11/01/2015 16:49   Mr Brain Wo Contrast  11/02/2015  CLINICAL DATA:  60 year old diabetic hypertensive female with hyperlipidemia and cocaine abuse presenting with change in speech and left-sided paresthesias. Recent embolic stroke. Subsequent encounter. EXAM: MRI HEAD WITHOUT CONTRAST MRA HEAD WITHOUT CONTRAST TECHNIQUE: Multiplanar, multiecho pulse sequences of the brain and surrounding structures were obtained without intravenous contrast. Angiographic images of the head were obtained using MRA technique without contrast. COMPARISON:  11/01/2015 head CT.  08/05/2015 brain MR. FINDINGS: MRI HEAD FINDINGS No acute infarct or intracranial hemorrhage. Remote right parietal -occipital lobe infarct. Mild to moderate chronic small vessel disease changes. No hydrocephalus. No intracranial mass lesion noted on this unenhanced exam. Mild exophthalmos. Maxillary sinus and ethmoid sinus air cell mucosal thickening with polypoid opacification superior aspect left maxillary sinus. Polypoid opacification sphenoid sinus. Expanded partially empty sella without other findings to suggest pseudotumor cerebri MRA HEAD FINDINGS Moderate tandem stenosis left internal carotid artery pre cavernous/ cavernous segment. Mild  stenosis cavernous and supraclinoid segment of the right internal carotid artery. Moderate stenosis M1 segment right middle cerebral artery. Marked stenosis proximal M2 segment right middle cerebral artery. Decrease number of visualized right middle cerebral artery branches consistent with patient's remote infarct. 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 Fetal type contribution to the left posterior cerebral artery. Marked focal stenosis distal right vertebral artery. Mild narrowing of the right vertebral artery proximal to the takeoff of the right posterior inferior cerebellar artery. Mild narrowing and irregularity of portions of the right posterior inferior cerebellar artery. Nonvisualized left posterior inferior cerebellar artery. Mild narrowing  proximal basilar artery. Moderate focal stenosis proximal and mid aspect of the posterior cerebellar artery more notable on the right. Mild narrowing irregularity posterior cerebral artery distal branches. IMPRESSION: MRI HEAD No acute infarct or intracranial hemorrhage. Remote right parietal -occipital lobe infarct. Mild to moderate chronic small vessel disease changes. Maxillary sinus and ethmoid sinus air cell mucosal thickening with polypoid opacification superior aspect left maxillary sinus. Polypoid opacification sphenoid sinus. Expanded partially empty sella. MRA HEAD Moderate tandem stenosis left internal carotid artery pre cavernous/ cavernous segment. Mild stenosis cavernous and supraclinoid segment of the right internal carotid artery. Moderate stenosis M1 segment right middle cerebral artery. Marked stenosis proximal M2 segment right middle cerebral artery. Decrease number of visualized right middle cerebral artery branches consistent with patient's remote infarct. 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  Nonvisualized left posterior inferior cerebellar artery. Mild narrowing proximal basilar artery. Moderate focal stenosis proximal and mid aspect of the posterior cerebellar artery more notable on the right. Mild narrowing irregularity posterior cerebral artery distal branches. Electronically Signed   By: Genia Del M.D.   On: 11/02/2015 07:03   Mr Mra Head/brain Wo Cm  11/02/2015  CLINICAL DATA:  60 year old diabetic hypertensive female with hyperlipidemia and cocaine abuse presenting with change in speech and left-sided paresthesias. Recent embolic stroke. Subsequent encounter. EXAM: MRI HEAD WITHOUT CONTRAST MRA HEAD WITHOUT CONTRAST TECHNIQUE: Multiplanar, multiecho pulse sequences of the brain and surrounding structures were obtained without intravenous contrast. Angiographic images of the head were obtained using MRA technique without contrast. COMPARISON:  11/01/2015 head CT.  08/05/2015 brain MR. FINDINGS: MRI HEAD FINDINGS No acute infarct or intracranial hemorrhage. Remote right parietal -occipital lobe infarct. Mild to moderate chronic small vessel disease changes. No hydrocephalus. No intracranial mass lesion noted on this unenhanced exam. Mild exophthalmos. Maxillary sinus and ethmoid sinus air cell mucosal thickening with polypoid opacification superior aspect left maxillary sinus. Polypoid opacification sphenoid sinus. Expanded partially empty sella without other findings to suggest pseudotumor cerebri MRA HEAD FINDINGS Moderate tandem stenosis left internal carotid artery pre cavernous/ cavernous segment. Mild stenosis cavernous and supraclinoid segment of the right internal carotid artery. Moderate stenosis M1 segment right middle cerebral artery. Marked stenosis proximal M2 segment right middle cerebral artery. Decrease number of visualized right middle cerebral artery branches consistent with patient's remote infarct. 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 Fetal type contribution to the left posterior cerebral artery. Marked focal stenosis distal right vertebral artery. Mild narrowing of the right vertebral artery proximal to the takeoff of the right posterior inferior cerebellar artery. Mild narrowing and irregularity of portions of the right posterior inferior cerebellar artery. Nonvisualized left posterior inferior cerebellar artery. Mild narrowing proximal basilar artery. Moderate focal stenosis proximal and mid aspect of the posterior cerebellar artery more notable on the right. Mild narrowing irregularity posterior cerebral artery distal branches. IMPRESSION: MRI HEAD No acute infarct or intracranial hemorrhage. Remote right parietal -occipital lobe infarct. Mild to moderate chronic small vessel disease changes. Maxillary sinus and ethmoid sinus air cell mucosal thickening with polypoid opacification superior aspect left maxillary sinus. Polypoid opacification sphenoid sinus. Expanded partially empty sella. MRA HEAD Moderate tandem stenosis left internal carotid artery pre cavernous/ cavernous segment. Mild stenosis cavernous and supraclinoid segment of the right internal carotid artery. Moderate stenosis M1 segment right middle cerebral artery. Marked stenosis proximal M2 segment right middle cerebral artery. Decrease number of visualized right middle cerebral artery branches  consistent with patient's remote infarct. 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 Nonvisualized left posterior inferior cerebellar artery. Mild narrowing proximal basilar artery. Moderate focal stenosis proximal and mid aspect of the posterior cerebellar artery more notable on the right. Mild narrowing irregularity posterior cerebral artery distal branches. Electronically Signed   By: Genia Del M.D.   On: 11/02/2015 07:03    Microbiology: No results found  for this or any previous visit (from the past 240 hour(s)).   Labs: Basic Metabolic Panel:  Recent Labs Lab 11/01/15 1532 11/01/15 1544  NA 137 140  K 3.6 3.6  CL 98* 100*  CO2 24  --   GLUCOSE 137* 128*  BUN 16 17  CREATININE 1.04* 1.00  CALCIUM 9.3  --    Liver Function Tests:  Recent Labs Lab 11/01/15 1532  AST 18  ALT 13*  ALKPHOS 76  BILITOT 0.4  PROT 6.4*  ALBUMIN 3.5   No results for input(s): LIPASE, AMYLASE in the last 168 hours. No results for input(s): AMMONIA in the last 168 hours. CBC:  Recent Labs Lab 11/01/15 1532 11/01/15 1544  WBC 10.5  --   NEUTROABS 5.8  --   HGB 11.8* 12.9  HCT 36.2 38.0  MCV 86.6  --   PLT 295  --    Cardiac Enzymes: No results for input(s): CKTOTAL, CKMB, CKMBINDEX, TROPONINI in the last 168 hours. BNP: BNP (last 3 results) No results for input(s): BNP in the last 8760 hours.  ProBNP (last 3 results) No results for input(s): PROBNP in the last 8760 hours.  CBG:  Recent Labs Lab 11/01/15 2033 11/02/15 0659 11/02/15 1112  GLUCAP 118* 155* 244*       Signed:  Makinzi Prieur U Rylee Huestis DO.  [facility] 11/02/2015, 1:00 PM

## 2015-11-02 NOTE — Progress Notes (Signed)
VASCULAR LAB PRELIMINARY  PRELIMINARY  PRELIMINARY  PRELIMINARY  Carotid duplex completed.    Preliminary report:  1-39% ICA plaquing.  Vertebral artery flow is antegrade.   Jaira Canady, RVT 11/02/2015, 12:10 PM

## 2015-11-02 NOTE — Evaluation (Signed)
Occupational Therapy Evaluation Patient Details Name: Lisa Anderson MRN: [id number] DOB: 18-Nov-1955 Today's Date: 11/02/2015    History of Present Illness Patient is a 60 y.o. female with hx of HLD, recent embolic CVA, cocaine abuse in remission, cancer, asthma, DM, CVA who presented with headache, speech changes and left-sided paraesthesias. Head CT and MRI-unremarkable.   Clinical Impression   Pt admitted with above. Pt independent with ADLs, PTA. Feel pt will benefit from acute OT to increase strength and independence prior to d/c.    Follow Up Recommendations  No OT follow up;Supervision - Intermittent    Equipment Recommendations  None recommended by OT-pt plans to use chair she has at home   Recommendations for Other Services       Precautions / Restrictions Precautions Precautions: Fall Restrictions Weight Bearing Restrictions: No      Mobility Bed Mobility Overal bed mobility: Modified Independent                Transfers Overall transfer level: Needs assistance Equipment used: None Transfers: Sit to/from Stand Sit to Stand: Supervision            Balance Decreased standing balance with single leg stance. Also, appeared to have decreased balance when reaching to simulate picking item off of floor, but pt able to self correct.                           ADL Overall ADL's : Needs assistance/impaired             Lower Body Bathing: Set up;Supervison/ safety;Sit to/from stand Lower Body Bathing Details (indicate cue type and reason): pt tried to simulate LB bathing standing but was difficult-Min guard given     Lower Body Dressing: Sit to/from stand;Set up;Supervision/safety   Toilet Transfer: Min guard;Ambulation (sit to stand from bed)           Functional mobility during ADLs: Min guard General ADL Comments: Educated on energy conservation. Educated on options for shower chair. Educated on safety. Gave strategies for  cognition such as using pill box.     Vision  Reports vision at baseline in session.   Perception     Praxis      Pertinent Vitals/Pain Pain Assessment: No/denies pain     Hand Dominance Right   Extremity/Trunk Assessment Upper Extremity Assessment Upper Extremity Assessment: Generalized weakness (in bilateral shoulder flexors)   Lower Extremity Assessment Lower Extremity Assessment: Defer to PT evaluation   Cervical / Trunk Assessment Cervical / Trunk Assessment: Normal   Communication Communication Communication: No difficulties   Cognition Arousal/Alertness: Awake/alert Behavior During Therapy: WFL for tasks assessed/performed Overall Cognitive Status: Impaired/Different from baseline Area of Impairment: Memory     Memory: Decreased short-term memory (reports decreased short-term memory)             General Comments       Exercises       Shoulder Instructions      Home Living Family/patient expects to be discharged to:: Private residence Living Arrangements: Children (and grandchildren) Available Help at Discharge: Family;Available PRN/intermittently Type of Home: House Home Access: Level entry     Home Layout: Two level;Bed/bath upstairs Alternate Level Stairs-Number of Steps: 1 flight Alternate Level Stairs-Rails: Right Bathroom Shower/Tub: Tub/shower unit   Bathroom Toilet: Standard     Home Equipment:  (pt thinks she has chair she can use as shower chair)          Prior  Functioning/Environment Level of Independence: Independent        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    OT Diagnosis: Generalized weakness   OT Problem List: Decreased strength;Decreased activity tolerance;Impaired balance (sitting and/or standing);Decreased cognition;Decreased knowledge of precautions   OT Treatment/Interventions: Self-care/ADL training;Therapeutic exercise;Energy conservation;DME and/or AE instruction;Therapeutic  activities;Patient/family education;Balance training;Cognitive remediation/compensation    OT Goals(Current goals can be found in the care plan section) Acute Rehab OT Goals Patient Stated Goal: not stated OT Goal Formulation: With patient Time For Goal Achievement: 11/09/15 Potential to Achieve Goals: Good ADL Goals Pt Will Perform Lower Body Dressing: with modified independence;sit to/from stand (including gathering items in low drawer) Pt Will Perform Tub/Shower Transfer: Tub transfer;with supervision;ambulating;with set-up;shower seat Additional ADL Goal #1: Pt will independently perform HEP for bilateral UEs to increase strength.  OT Frequency: Min 2X/week   Barriers to D/C:            Co-evaluation              End of Session    Activity Tolerance: Patient tolerated treatment well Patient left: in bed;with call bell/phone within reach;with bed alarm set   Time: [id number] OT Time Calculation (min): 20 min Charges:  OT General Charges $OT Visit: 1 Procedure OT Evaluation $OT Eval Moderate Complexity: 1 Procedure G-Codes: OT G-codes **NOT FOR INPATIENT CLASS** Functional Assessment Tool Used: clinical judgment Functional Limitation: Self care Self Care Current Status [id number]): At least 1 percent but less than 20 percent impaired, limited or restricted Self Care Goal Status [id number]): At least 1 percent but less than 20 percent impaired, limited or restricted  Benito Mccreedy OTR/L [id number]  11/02/2015, 2:57 PM

## 2015-11-03 LAB — HEMOGLOBIN A1C
Hgb A1c MFr Bld: 9.9 % — ABNORMAL HIGH (ref 4.8–5.6)
Mean Plasma Glucose: 237 mg/dL

## 2015-11-03 NOTE — Progress Notes (Signed)
PT eval addendum- added G-codes    2015-11-27 1306  PT G-Codes **NOT FOR INPATIENT CLASS**  Functional Assessment Tool Used clinical judgment  Functional Limitation Mobility: Walking and moving around  Mobility: Walking and Moving Around Current Status [id number]) CI  Mobility: Walking and Moving Around Goal Status [id number]) CI    Wray Kearns, PT, DPT [phone]

## 2015-11-05 ENCOUNTER — Ambulatory Visit (INDEPENDENT_AMBULATORY_CARE_PROVIDER_SITE_OTHER): Payer: PRIVATE HEALTH INSURANCE | Admitting: *Deleted

## 2015-11-05 DIAGNOSIS — I63011 Cerebral infarction due to thrombosis of right vertebral artery: Secondary | ICD-10-CM

## 2015-11-06 ENCOUNTER — Inpatient Hospital Stay: Payer: Self-pay | Admitting: Family Medicine

## 2015-11-06 NOTE — Progress Notes (Signed)
[facility] Summary Report / Loop Recorder 

## 2015-11-11 ENCOUNTER — Emergency Department (HOSPITAL_COMMUNITY): Payer: PRIVATE HEALTH INSURANCE

## 2015-11-11 ENCOUNTER — Inpatient Hospital Stay (HOSPITAL_COMMUNITY)
Admission: EM | Admit: 2015-11-11 | Discharge: 2015-11-16 | DRG: 202 | Disposition: A | Discharge status: Home / Self Care | Payer: PRIVATE HEALTH INSURANCE | Attending: Family Medicine | Admitting: Family Medicine

## 2015-11-11 ENCOUNTER — Encounter (HOSPITAL_COMMUNITY): Payer: Self-pay | Admitting: Emergency Medicine

## 2015-11-11 DIAGNOSIS — R062 Wheezing: Secondary | ICD-10-CM

## 2015-11-11 DIAGNOSIS — Z79899 Other long term (current) drug therapy: Secondary | ICD-10-CM

## 2015-11-11 DIAGNOSIS — I5032 Chronic diastolic (congestive) heart failure: Secondary | ICD-10-CM | POA: Diagnosis present

## 2015-11-11 DIAGNOSIS — D72829 Elevated white blood cell count, unspecified: Secondary | ICD-10-CM | POA: Diagnosis present

## 2015-11-11 DIAGNOSIS — J45901 Unspecified asthma with (acute) exacerbation: Secondary | ICD-10-CM

## 2015-11-11 DIAGNOSIS — Z7902 Long term (current) use of antithrombotics/antiplatelets: Secondary | ICD-10-CM

## 2015-11-11 DIAGNOSIS — I10 Essential (primary) hypertension: Secondary | ICD-10-CM

## 2015-11-11 DIAGNOSIS — J45909 Unspecified asthma, uncomplicated: Secondary | ICD-10-CM | POA: Diagnosis present

## 2015-11-11 DIAGNOSIS — T380X5A Adverse effect of glucocorticoids and synthetic analogues, initial encounter: Secondary | ICD-10-CM | POA: Diagnosis present

## 2015-11-11 DIAGNOSIS — Z794 Long term (current) use of insulin: Secondary | ICD-10-CM

## 2015-11-11 DIAGNOSIS — E1159 Type 2 diabetes mellitus with other circulatory complications: Secondary | ICD-10-CM | POA: Diagnosis present

## 2015-11-11 DIAGNOSIS — E118 Type 2 diabetes mellitus with unspecified complications: Secondary | ICD-10-CM

## 2015-11-11 DIAGNOSIS — J479 Bronchiectasis, uncomplicated: Secondary | ICD-10-CM | POA: Diagnosis present

## 2015-11-11 DIAGNOSIS — J9601 Acute respiratory failure with hypoxia: Secondary | ICD-10-CM | POA: Diagnosis not present

## 2015-11-11 DIAGNOSIS — R05 Cough: Secondary | ICD-10-CM

## 2015-11-11 DIAGNOSIS — E1165 Type 2 diabetes mellitus with hyperglycemia: Secondary | ICD-10-CM | POA: Diagnosis present

## 2015-11-11 DIAGNOSIS — Z7984 Long term (current) use of oral hypoglycemic drugs: Secondary | ICD-10-CM

## 2015-11-11 DIAGNOSIS — J441 Chronic obstructive pulmonary disease with (acute) exacerbation: Secondary | ICD-10-CM | POA: Diagnosis present

## 2015-11-11 DIAGNOSIS — Z853 Personal history of malignant neoplasm of breast: Secondary | ICD-10-CM

## 2015-11-11 DIAGNOSIS — Z91013 Allergy to seafood: Secondary | ICD-10-CM

## 2015-11-11 DIAGNOSIS — R0989 Other specified symptoms and signs involving the circulatory and respiratory systems: Secondary | ICD-10-CM

## 2015-11-11 DIAGNOSIS — E785 Hyperlipidemia, unspecified: Secondary | ICD-10-CM | POA: Diagnosis present

## 2015-11-11 DIAGNOSIS — F329 Major depressive disorder, single episode, unspecified: Secondary | ICD-10-CM | POA: Diagnosis present

## 2015-11-11 DIAGNOSIS — E119 Type 2 diabetes mellitus without complications: Secondary | ICD-10-CM

## 2015-11-11 DIAGNOSIS — Z8673 Personal history of transient ischemic attack (TIA), and cerebral infarction without residual deficits: Secondary | ICD-10-CM

## 2015-11-11 DIAGNOSIS — R053 Chronic cough: Secondary | ICD-10-CM

## 2015-11-11 DIAGNOSIS — IMO0001 Reserved for inherently not codable concepts without codable children: Secondary | ICD-10-CM | POA: Insufficient documentation

## 2015-11-11 DIAGNOSIS — I2510 Atherosclerotic heart disease of native coronary artery without angina pectoris: Secondary | ICD-10-CM | POA: Diagnosis present

## 2015-11-11 DIAGNOSIS — Z7951 Long term (current) use of inhaled steroids: Secondary | ICD-10-CM

## 2015-11-11 DIAGNOSIS — Z888 Allergy status to other drugs, medicaments and biological substances status: Secondary | ICD-10-CM

## 2015-11-11 DIAGNOSIS — F17210 Nicotine dependence, cigarettes, uncomplicated: Secondary | ICD-10-CM | POA: Diagnosis present

## 2015-11-11 DIAGNOSIS — J9600 Acute respiratory failure, unspecified whether with hypoxia or hypercapnia: Secondary | ICD-10-CM

## 2015-11-11 DIAGNOSIS — I110 Hypertensive heart disease with heart failure: Secondary | ICD-10-CM | POA: Diagnosis present

## 2015-11-11 LAB — INFLUENZA PANEL BY PCR (TYPE A & B)
H1N1FLUPCR: NOT DETECTED
INFLAPCR: NEGATIVE
Influenza B By PCR: NEGATIVE

## 2015-11-11 LAB — CBC WITH DIFFERENTIAL/PLATELET
BASOS PCT: 0 %
Basophils Absolute: 0 10*3/uL (ref 0.0–0.1)
Eosinophils Absolute: 0.4 10*3/uL (ref 0.0–0.7)
Eosinophils Relative: 5 %
HEMATOCRIT: 37.2 % (ref 36.0–46.0)
HEMOGLOBIN: 11.8 g/dL — AB (ref 12.0–15.0)
Lymphocytes Relative: 16 %
Lymphs Abs: 1.2 10*3/uL (ref 0.7–4.0)
MCH: 27.3 pg (ref 26.0–34.0)
MCHC: 31.7 g/dL (ref 30.0–36.0)
MCV: 86.1 fL (ref 78.0–100.0)
MONOS PCT: 8 %
Monocytes Absolute: 0.7 10*3/uL (ref 0.1–1.0)
NEUTROS ABS: 5.6 10*3/uL (ref 1.7–7.7)
NEUTROS PCT: 71 %
PLATELETS: 259 10*3/uL (ref 150–400)
RBC: 4.32 MIL/uL (ref 3.87–5.11)
RDW: 13.2 % (ref 11.5–15.5)
WBC: 8 10*3/uL (ref 4.0–10.5)

## 2015-11-11 LAB — BASIC METABOLIC PANEL
ANION GAP: 15 (ref 5–15)
BUN: 11 mg/dL (ref 6–20)
CHLORIDE: 95 mmol/L — AB (ref 101–111)
CO2: 25 mmol/L (ref 22–32)
Calcium: 9.1 mg/dL (ref 8.9–10.3)
Creatinine, Ser: 1.06 mg/dL — ABNORMAL HIGH (ref 0.44–1.00)
GFR calc Af Amer: >60 mL/min (ref >60)
GFR, EST NON AFRICAN AMERICAN: 57 mL/min — AB (ref >60)
Glucose, Bld: 273 mg/dL — ABNORMAL HIGH (ref 65–99)
POTASSIUM: 3.7 mmol/L (ref 3.5–5.1)
SODIUM: 135 mmol/L (ref 135–145)

## 2015-11-11 LAB — CBG MONITORING, ED: Glucose-Capillary: 291 mg/dL — ABNORMAL HIGH (ref 65–99)

## 2015-11-11 LAB — I-STAT TROPONIN, ED: TROPONIN I, POC: 0 ng/mL (ref 0.00–0.08)

## 2015-11-11 LAB — RAPID URINE DRUG SCREEN, HOSP PERFORMED
AMPHETAMINES: NOT DETECTED
BARBITURATES: NOT DETECTED
BENZODIAZEPINES: NOT DETECTED
COCAINE: NOT DETECTED
Opiates: POSITIVE — AB
Tetrahydrocannabinol: NOT DETECTED

## 2015-11-11 LAB — ETHANOL: Alcohol, Ethyl (B): <5 mg/dL (ref <5)

## 2015-11-11 LAB — GLUCOSE, CAPILLARY
GLUCOSE-CAPILLARY: 374 mg/dL — AB (ref 65–99)
Glucose-Capillary: 456 mg/dL — ABNORMAL HIGH (ref 65–99)
Glucose-Capillary: 238 mg/dL — ABNORMAL HIGH (ref 65–99)

## 2015-11-11 MED ORDER — TRIAMTERENE-HCTZ 37.5-25 MG PO TABS
1.0000 | ORAL_TABLET | Freq: Every day | ORAL | Status: DC
  Administered 2015-11-11 – 2015-11-16 (×6): 1 via ORAL
  Filled 2015-11-11 (×6): qty 1

## 2015-11-11 MED ORDER — ACETAMINOPHEN 325 MG PO TABS
650.0000 mg | ORAL_TABLET | ORAL | Status: DC | PRN
  Filled 2015-11-11 (×5): qty 2

## 2015-11-11 MED ORDER — LORAZEPAM 2 MG/ML IJ SOLN: 1.0000 mg | Freq: Four times a day (QID) | INTRAMUSCULAR | Status: AC | PRN

## 2015-11-11 MED ORDER — MORPHINE SULFATE (PF) 4 MG/ML IV SOLN
4.0000 mg | Freq: Once | INTRAVENOUS | Status: AC
  Administered 2015-11-11: 4 mg via INTRAVENOUS
  Filled 2015-11-11: qty 1

## 2015-11-11 MED ORDER — ALBUTEROL SULFATE (2.5 MG/3ML) 0.083% IN NEBU
INHALATION_SOLUTION | RESPIRATORY_TRACT | Status: AC
  Filled 2015-11-11: qty 6

## 2015-11-11 MED ORDER — ACETAMINOPHEN 325 MG PO TABS
650.0000 mg | ORAL_TABLET | Freq: Four times a day (QID) | ORAL | Status: DC | PRN
  Administered 2015-11-11 – 2015-11-15 (×5): 650 mg via ORAL

## 2015-11-11 MED ORDER — SODIUM CHLORIDE 0.9% FLUSH
3.0000 mL | Freq: Two times a day (BID) | INTRAVENOUS | Status: DC
  Administered 2015-11-11 – 2015-11-16 (×10): 3 mL via INTRAVENOUS

## 2015-11-11 MED ORDER — DULOXETINE HCL 30 MG PO CPEP
30.0000 mg | ORAL_CAPSULE | Freq: Every day | ORAL | Status: DC
  Administered 2015-11-11 – 2015-11-16 (×6): 30 mg via ORAL
  Filled 2015-11-11 (×6): qty 1

## 2015-11-11 MED ORDER — ADULT MULTIVITAMIN W/MINERALS CH
1.0000 | ORAL_TABLET | Freq: Every day | ORAL | Status: DC
  Administered 2015-11-11 – 2015-11-16 (×6): 1 via ORAL
  Filled 2015-11-11 (×6): qty 1

## 2015-11-11 MED ORDER — ACETAMINOPHEN 650 MG RE SUPP: 650.0000 mg | Freq: Four times a day (QID) | RECTAL | Status: DC | PRN

## 2015-11-11 MED ORDER — VITAMIN B-1 100 MG PO TABS
100.0000 mg | ORAL_TABLET | Freq: Every day | ORAL | Status: DC
  Administered 2015-11-11 – 2015-11-16 (×6): 100 mg via ORAL
  Filled 2015-11-11 (×6): qty 1

## 2015-11-11 MED ORDER — IPRATROPIUM-ALBUTEROL 0.5-2.5 (3) MG/3ML IN SOLN
3.0000 mL | RESPIRATORY_TRACT | Status: AC
  Administered 2015-11-11 (×3): 3 mL via RESPIRATORY_TRACT
  Filled 2015-11-11 (×3): qty 3

## 2015-11-11 MED ORDER — ONDANSETRON HCL 4 MG/2ML IJ SOLN
4.0000 mg | Freq: Once | INTRAMUSCULAR | Status: AC
  Administered 2015-11-11: 4 mg via INTRAVENOUS
  Filled 2015-11-11: qty 2

## 2015-11-11 MED ORDER — HYDRALAZINE HCL 50 MG PO TABS
50.0000 mg | ORAL_TABLET | Freq: Three times a day (TID) | ORAL | Status: DC
  Administered 2015-11-11 – 2015-11-16 (×17): 50 mg via ORAL
  Filled 2015-11-11 (×8): qty 1
  Filled 2015-11-11: qty 2
  Filled 2015-11-11 (×8): qty 1

## 2015-11-11 MED ORDER — ALBUTEROL (5 MG/ML) CONTINUOUS INHALATION SOLN
10.0000 mg/h | INHALATION_SOLUTION | Freq: Once | RESPIRATORY_TRACT | Status: AC
  Administered 2015-11-11: 10 mg/h via RESPIRATORY_TRACT
  Filled 2015-11-11: qty 40

## 2015-11-11 MED ORDER — THIAMINE HCL 100 MG/ML IJ SOLN
100.0000 mg | Freq: Every day | INTRAMUSCULAR | Status: DC
  Filled 2015-11-11: qty 2

## 2015-11-11 MED ORDER — METHYLPREDNISOLONE SODIUM SUCC 125 MG IJ SOLR
60.0000 mg | Freq: Two times a day (BID) | INTRAMUSCULAR | Status: DC
  Administered 2015-11-11 – 2015-11-16 (×10): 60 mg via INTRAVENOUS
  Filled 2015-11-11 (×10): qty 2

## 2015-11-11 MED ORDER — IPRATROPIUM-ALBUTEROL 0.5-2.5 (3) MG/3ML IN SOLN
3.0000 mL | RESPIRATORY_TRACT | Status: AC
  Administered 2015-11-11 (×3): 3 mL via RESPIRATORY_TRACT
  Filled 2015-11-11 (×2): qty 3

## 2015-11-11 MED ORDER — FLUOXETINE HCL 20 MG PO CAPS
20.0000 mg | ORAL_CAPSULE | Freq: Every day | ORAL | Status: DC
  Administered 2015-11-11 – 2015-11-16 (×6): 20 mg via ORAL
  Filled 2015-11-11 (×6): qty 1

## 2015-11-11 MED ORDER — CLOPIDOGREL BISULFATE 75 MG PO TABS
75.0000 mg | ORAL_TABLET | Freq: Every day | ORAL | Status: DC
  Administered 2015-11-11 – 2015-11-16 (×6): 75 mg via ORAL
  Filled 2015-11-11 (×7): qty 1

## 2015-11-11 MED ORDER — VANCOMYCIN HCL 10 G IV SOLR
1500.0000 mg | Freq: Once | INTRAVENOUS | Status: DC
  Filled 2015-11-11: qty 1500

## 2015-11-11 MED ORDER — VANCOMYCIN HCL IN DEXTROSE 1-5 GM/200ML-% IV SOLN: 1000.0000 mg | Freq: Two times a day (BID) | INTRAVENOUS | Status: DC

## 2015-11-11 MED ORDER — ONDANSETRON HCL 4 MG/2ML IJ SOLN: 4.0000 mg | Freq: Four times a day (QID) | INTRAMUSCULAR | Status: DC | PRN

## 2015-11-11 MED ORDER — METHYLPREDNISOLONE SODIUM SUCC 125 MG IJ SOLR
125.0000 mg | Freq: Once | INTRAMUSCULAR | Status: AC
  Administered 2015-11-11: 125 mg via INTRAVENOUS
  Filled 2015-11-11: qty 2

## 2015-11-11 MED ORDER — LORAZEPAM 1 MG PO TABS: 1.0000 mg | ORAL_TABLET | Freq: Four times a day (QID) | ORAL | Status: AC | PRN

## 2015-11-11 MED ORDER — ENOXAPARIN SODIUM 40 MG/0.4ML ~~LOC~~ SOLN
40.0000 mg | Freq: Every day | SUBCUTANEOUS | Status: DC
  Administered 2015-11-11 – 2015-11-16 (×6): 40 mg via SUBCUTANEOUS
  Filled 2015-11-11 (×6): qty 0.4

## 2015-11-11 MED ORDER — GI COCKTAIL ~~LOC~~: 30.0000 mL | Freq: Four times a day (QID) | ORAL | Status: DC | PRN

## 2015-11-11 MED ORDER — PIPERACILLIN-TAZOBACTAM 3.375 G IVPB
3.3750 g | Freq: Three times a day (TID) | INTRAVENOUS | Status: DC
  Filled 2015-11-11: qty 50

## 2015-11-11 MED ORDER — INSULIN ASPART 100 UNIT/ML ~~LOC~~ SOLN
0.0000 [IU] | Freq: Every day | SUBCUTANEOUS | Status: DC
  Administered 2015-11-11 – 2015-11-12 (×2): 2 [IU] via SUBCUTANEOUS
  Administered 2015-11-13: 4 [IU] via SUBCUTANEOUS
  Administered 2015-11-14: 3 [IU] via SUBCUTANEOUS
  Administered 2015-11-15: 5 [IU] via SUBCUTANEOUS

## 2015-11-11 MED ORDER — ALBUTEROL SULFATE (2.5 MG/3ML) 0.083% IN NEBU
5.0000 mg | INHALATION_SOLUTION | Freq: Once | RESPIRATORY_TRACT | Status: AC
  Administered 2015-11-11: 5 mg via RESPIRATORY_TRACT

## 2015-11-11 MED ORDER — INSULIN ASPART 100 UNIT/ML ~~LOC~~ SOLN
0.0000 [IU] | Freq: Three times a day (TID) | SUBCUTANEOUS | Status: DC
  Administered 2015-11-11 (×2): 20 [IU] via SUBCUTANEOUS
  Administered 2015-11-11: 11 [IU] via SUBCUTANEOUS
  Administered 2015-11-12: 15 [IU] via SUBCUTANEOUS
  Administered 2015-11-12: 11 [IU] via SUBCUTANEOUS
  Administered 2015-11-12: 20 [IU] via SUBCUTANEOUS
  Administered 2015-11-13: 4 [IU] via SUBCUTANEOUS
  Administered 2015-11-13: 11 [IU] via SUBCUTANEOUS
  Administered 2015-11-13: 20 [IU] via SUBCUTANEOUS
  Administered 2015-11-14 (×2): 15 [IU] via SUBCUTANEOUS
  Administered 2015-11-14: 11 [IU] via SUBCUTANEOUS
  Administered 2015-11-15: 15 [IU] via SUBCUTANEOUS
  Administered 2015-11-15 – 2015-11-16 (×5): 20 [IU] via SUBCUTANEOUS
  Filled 2015-11-11: qty 1

## 2015-11-11 MED ORDER — ALPRAZOLAM 0.25 MG PO TABS: 0.2500 mg | ORAL_TABLET | Freq: Two times a day (BID) | ORAL | Status: DC | PRN

## 2015-11-11 MED ORDER — IOHEXOL 350 MG/ML SOLN
100.0000 mL | Freq: Once | INTRAVENOUS | Status: AC | PRN
  Administered 2015-11-11: 100 mL via INTRAVENOUS

## 2015-11-11 MED ORDER — FOLIC ACID 1 MG PO TABS
1.0000 mg | ORAL_TABLET | Freq: Every day | ORAL | Status: DC
  Administered 2015-11-11 – 2015-11-16 (×6): 1 mg via ORAL
  Filled 2015-11-11 (×6): qty 1

## 2015-11-11 MED ORDER — IPRATROPIUM BROMIDE 0.02 % IN SOLN
0.5000 mg | Freq: Once | RESPIRATORY_TRACT | Status: AC
  Administered 2015-11-11: 0.5 mg via RESPIRATORY_TRACT
  Filled 2015-11-11: qty 2.5

## 2015-11-11 MED ORDER — ONDANSETRON HCL 4 MG PO TABS: 4.0000 mg | ORAL_TABLET | Freq: Four times a day (QID) | ORAL | Status: DC | PRN

## 2015-11-11 MED ORDER — ROSUVASTATIN CALCIUM 40 MG PO TABS
40.0000 mg | ORAL_TABLET | Freq: Every evening | ORAL | Status: DC
  Administered 2015-11-11 – 2015-11-16 (×6): 40 mg via ORAL
  Filled 2015-11-11 (×2): qty 1
  Filled 2015-11-11 (×5): qty 2
  Filled 2015-11-11 (×4): qty 1

## 2015-11-11 MED ORDER — SODIUM CHLORIDE 0.9 % IV SOLN
INTRAVENOUS | Status: DC
  Administered 2015-11-11 – 2015-11-12 (×3): via INTRAVENOUS

## 2015-11-11 NOTE — Progress Notes (Signed)
New Admission Note: transfer From ED  Arrival Method: stretcher Mental Orientation: a/o x4 Telemetry: placed Assessment: Completed Skin: clean dry intact IV: LAC NS @ 75 Pain: left upper back. She stated it began when she started coughing Tubes: n/a Safety Measures: Safety Fall Prevention Plan has been given, discussed and signed Admission: Completed Unit Orientation: Patient has been orientated to the room, unit and staff.  Family:n/a  Orders have been reviewed and implemented. Will continue to monitor the patient. Call light has been placed within reach and bed alarm has been activated.   Retta Mac BSN, RN

## 2015-11-11 NOTE — Progress Notes (Signed)
Inpatient Diabetes Program Recommendations  AACE/ADA: New Consensus Statement on Inpatient Glycemic Control (2015)  Target Ranges:  Prepandial:   less than 140 mg/dL      Peak postprandial:   less than 180 mg/dL (1-2 hours)      Critically ill patients:  140 - 180 mg/dL   Results for ROSEALYNN, CHINO (MRN [id number]) as of 11/11/2015 12:19  Ref. Range 11/11/2015 09:32 11/11/2015 11:48  Glucose-Capillary Latest Ref Range: 65-99 mg/dL 291 (H) 456 (H)   Review of Glycemic Control   Inpatient Diabetes Program Recommendations: Insulin - Basal: Glucose greater than 400 at lunch time. Please consider starting Lantus 15 units Q24hrs (0.2 units/kg) while inpatient and on steroids.   Thanks,  Tama Headings RN, MSN, [facility] Inpatient Diabetes Coordinator Team Pager [phone] (8a-5p)

## 2015-11-11 NOTE — ED Provider Notes (Signed)
TIME SEEN: 5:50 AM  CHIEF COMPLAINT: Shortness of breath, wheezing, chest tightness, back pain  HPI: Pt is a 60 y.o. female with history of hypertension, diabetes, hyperlipidemia, asthma who does not wear oxygen at home who presents to the emergency department with complaints of wheezing, chills, tight chest, dry cough. She denies any fever. She is a smoker. No vomiting or diarrhea. Did have echocardiogram in November 2016 which showed an ejection fraction of 55-60% with grade 1 diastolic dysfunction. She denies history of PE or DVT and has no lower extreme swelling or pain. States that she has had similar symptoms with pneumonia in the past and is concerned she may have pneumonia today.  ROS: See HPI Constitutional: no fever  Eyes: no drainage  ENT: no runny nose   Cardiovascular:   chest pain  Resp:  SOB  GI: no vomiting GU: no dysuria Integumentary: no rash  Allergy: no hives  Musculoskeletal: no leg swelling  Neurological: no slurred speech ROS otherwise negative  PAST MEDICAL HISTORY/PAST SURGICAL HISTORY:  Past Medical History  Diagnosis Date  . Hypertension   . Diabetes mellitus   . Hyperlipidemia   . Asthma   . Cancer [facility])     MEDICATIONS:  Prior to Admission medications   Medication Sig Start Date End Date Taking? Authorizing Provider  albuterol (PROVENTIL HFA;VENTOLIN HFA) 108 (90 BASE) MCG/ACT inhaler Inhale 2 puffs into the lungs every 6 (six) hours as needed for wheezing or shortness of breath. Shortness of breath 09/02/15  Yes Arnoldo Morale, MD  clopidogrel (PLAVIX) 75 MG tablet Take 1 tablet (75 mg total) by mouth daily. 11/02/15  Yes Geradine Girt, DO  diltiazem (DILACOR XR) 240 MG 24 hr capsule Take 1 capsule (240 mg total) by mouth every morning. 08/07/15  Yes Ripudeep Krystal Eaton, MD  DULoxetine (CYMBALTA) 30 MG capsule Take 1 capsule (30 mg total) by mouth daily. 09/02/15  Yes Arnoldo Morale, MD  FLUoxetine (PROZAC) 20 MG capsule Take 20 mg by mouth daily.   Yes  Historical Provider, MD  folic acid (FOLVITE) 1 MG tablet Take 1 tablet (1 mg total) by mouth daily. 11/02/15  Yes Geradine Girt, DO  glipiZIDE (GLUCOTROL) 5 MG tablet Take 1 tablet (5 mg total) by mouth 2 (two) times daily before a meal. 08/07/15  Yes Ripudeep K Rai, MD  hydrALAZINE (APRESOLINE) 50 MG tablet Take 1 tablet (50 mg total) by mouth 3 (three) times daily. 08/07/15  Yes Ripudeep Krystal Eaton, MD  ibuprofen (ADVIL,MOTRIN) 200 MG tablet Take 200 mg by mouth every 6 (six) hours as needed for headache.   Yes Historical Provider, MD  insulin aspart (NOVOLOG) 100 UNIT/ML injection Sliding scale CBG 70 - 120: 0 units CBG 121 - 150: 1 unit,  CBG 151 - 200: 2 units,  CBG 201 - 250: 3 units,  CBG 251 - 300: 5 units,  CBG 301 - 350: 7 units,  CBG 351 - 400: 9 units   CBG > 400: 9 units and notify your MD 08/07/15  Yes Ripudeep Krystal Eaton, MD  metFORMIN (GLUCOPHAGE) 1000 MG tablet Take 1 tablet (1,000 mg total) by mouth 2 (two) times daily with a meal. 08/07/15  Yes Ripudeep K Rai, MD  Multiple Vitamin (MULTIVITAMIN WITH MINERALS) TABS tablet Take 1 tablet by mouth daily. 11/02/15  Yes Geradine Girt, DO  rosuvastatin (CRESTOR) 40 MG tablet Take 1 tablet (40 mg total) by mouth every evening. 11/02/15  Yes Geradine Girt, DO  thiamine  100 MG tablet Take 1 tablet (100 mg total) by mouth daily. 11/02/15  Yes Geradine Girt, DO  triamterene-hydrochlorothiazide (MAXZIDE-25) 37.5-25 MG tablet Take 1 tablet by mouth daily. 08/07/15  Yes Ripudeep Krystal Eaton, MD    ALLERGIES:  Allergies  Allergen Reactions  . Prednisone Hives and Other (See Comments)    Made her "crazy", suicidal  . Benicar [Olmesartan] Other (See Comments)    Did not work at all for patient  . Celexa [Citalopram] Other (See Comments)    Made patient feel crazy, fluoxetine is fine  . Effexor [Venlafaxine] Other (See Comments)    Made patient feel crazy  . Shrimp [Shellfish Allergy] Hives and Swelling    SOCIAL HISTORY:  Social History  Substance Use  Topics  . Smoking status: Current Some Day Smoker  . Smokeless tobacco: Not on file  . Alcohol Use: 3.6 oz/week    6 Cans of beer per week    FAMILY HISTORY: Family History  Problem Relation Age of Onset  . Cancer Mother   . Diabetes Mother   . Hyperlipidemia Mother   . Hypertension Mother   . Rheum arthritis Father   . Asthma Father   . Stroke Father   . Thyroid disease Father   . Thyroid disease Brother     EXAM: BP 175/77 mmHg  Pulse 115  Temp(Src) 99.8 F (37.7 C) (Oral)  Resp 16  Ht 5\' 5"  (1.651 m)  Wt 163 lb (73.936 kg)  BMI 27.12 kg/m2  SpO2 95% CONSTITUTIONAL: Alert and oriented and responds appropriately to questions. Appears uncomfortable and is in mild to moderate respiratory distress, temperature 99.8 orally HEAD: Normocephalic EYES: Conjunctivae clear, PERRL ENT: normal nose; no rhinorrhea; moist mucous membranes; pharynx without lesions noted NECK: Supple, no meningismus, no LAD  CARD: RRR; S1 and S2 appreciated; no murmurs, no clicks, no rubs, no gallops RESP: Patient is tachypneic with decreased aeration diffusely and rhonchorous breath sounds as well as expiratory wheezing. She has increased work of breathing and mild to moderate respiratory distress. Speaking short sentences after minimal activity. No hypoxia. ABD/GI: Normal bowel sounds; non-distended; soft, non-tender, no rebound, no guarding, no peritoneal signs BACK:  The back appears normal and is non-tender to palpation, there is no CVA tenderness EXT: Normal ROM in all joints; non-tender to palpation; no edema; normal capillary refill; no cyanosis, no calf tenderness or swelling    SKIN: Normal color for age and race; warm NEURO: Moves all extremities equally, sensation to light touch intact diffusely, cranial nerves II through XII intact PSYCH: The patient's mood and manner are appropriate. Grooming and personal hygiene are appropriate.  MEDICAL DECISION MAKING: Patient here with what appears to  be an asthma exacerbation that may be triggered by pneumonia, viral illness.  She does not appear volume overloaded on exam and has a history of grade 1 diastolic heart failure. Differential also includes pulmonary embolus. Patient's labs unremarkable. We'll add on a troponin. Chest x-ray is clear. We'll obtain CT of her chest to rule out PE given she is having chest pain and pain in her back is worse with deep inspiration. We'll give continuous albuterol, Solu-Medrol and reassess.  ED PROGRESS: Patient has had mild improvement after continuous albuterol. She is still working to breathe after any mild amount of exertion like sitting upright in the bed on her own. CT of her chest shows no pulmonary embolus. There is a density in the medial right lower lobe that appears to be chronic scarring  rather than pneumonia. She has no leukocytosis today but does have an oral temperature of 99.8 and reports cough and symptoms similar to when she has had pneumonia before. I will start her on broad-spectrum antibody for possible healthcare associated pneumonia. She was recently admitted to the hospital for unrelated illness to the hospitalist team. Will send influenza swab. Troponin is negative. I feel she will need admission and she agrees with this plan. She does not currently have a primary care provider.   7:45 AM  D/w Dr. Marily Memos with hospitalist service. He agrees on admission to medical bed, observation. I will place holding orders per his request. Care transferred to hospitalist service. Patient stable.     EKG Interpretation  Date/Time:  Wednesday November 11 2015 06:56:54 EST Ventricular Rate:  96 PR Interval:  153 QRS Duration: 95 QT Interval:  375 QTC Calculation: 474 R Axis:   -28 Text Interpretation:  Sinus rhythm Borderline left axis deviation No significant change since last tracing Confirmed by Arnetra Terris,  DO, Leotta Weingarten [id number]) on 11/11/2015 7:07:11 AM          Terrell, DO 11/11/15  [id number]

## 2015-11-11 NOTE — H&P (Signed)
[facility] History and Physical  Zakiya Zweig [id number] DOB: 10-21-1955 DOA: 11/11/2015  Referring physician: Dr Leonides Schanz - [facility] PCP: No primary care provider on file.   Chief Complaint: SOB and wheezing  HPI: Lisa Anderson is a 60 y.o. female  Acute onset shortness of breath, coughing, sneezing, and wheezing. Started 3 days ago when the weather became particularly warm and patient opened the windows in her house. Symptoms have been intermittent but getting worse. Home albuterol with only limited benefit. Associated with some chest tightness which is fairly typical for her asthma flares. This is relieved somewhat by inhaler treatments. Patient typically gets flares with changes in weather. Denies any actual chest pain, palpitations, LOC, fevers, productive cough, headache, neck stiffness, abdominal pain, dysuria, frequency, back pain, rash, nausea, vomiting.  Of note patient recently admitted for TIA. She was noted to have grade 1 diastolic congestive heart failure. Since time of discharge patient has not had any lower extremity swelling, increasing fatigue, or orthopnea.  Review of Systems:  Per history of present illness with all other systems negative  Past Medical History  Diagnosis Date  . Hypertension   . Diabetes mellitus   . Hyperlipidemia   . Asthma   . Cancer [facility])     breast  . CHF (congestive heart failure) [facility])    Past Surgical History  Procedure Laterality Date  . Tonsillectomy    . Breast lumpectomy    . Ep implantable device N/A 08/07/2015    Procedure: Loop Recorder Insertion;  Surgeon: Thompson Grayer, MD;  Location: [place] CV LAB;  Service: Cardiovascular;  Laterality: N/A;  . Tee without cardioversion N/A 08/07/2015    Procedure: TRANSESOPHAGEAL ECHOCARDIOGRAM (TEE);  Surgeon: Sueanne Margarita, MD;  Location: [facility] ENDOSCOPY;  Service: Cardiovascular;  Laterality: N/A;   Social History:  reports that she has been smoking.  She does not have any  smokeless tobacco history on file. She reports that she drinks about 3.6 oz of alcohol per week. She reports that she uses illicit drugs ("Crack" cocaine).  Allergies  Allergen Reactions  . Prednisone Hives and Other (See Comments)    Made her "crazy", suicidal  . Benicar [Olmesartan] Other (See Comments)    Did not work at all for patient  . Celexa [Citalopram] Other (See Comments)    Made patient feel crazy, fluoxetine is fine  . Effexor [Venlafaxine] Other (See Comments)    Made patient feel crazy  . Shrimp [Shellfish Allergy] Hives and Swelling    Family History  Problem Relation Age of Onset  . Cancer Mother   . Diabetes Mother   . Hyperlipidemia Mother   . Hypertension Mother   . Rheum arthritis Father   . Asthma Father   . Stroke Father   . Thyroid disease Father   . Thyroid disease Brother      Prior to Admission medications   Medication Sig Start Date End Date Taking? Authorizing Provider  albuterol (PROVENTIL HFA;VENTOLIN HFA) 108 (90 BASE) MCG/ACT inhaler Inhale 2 puffs into the lungs every 6 (six) hours as needed for wheezing or shortness of breath. Shortness of breath 09/02/15  Yes Arnoldo Morale, MD  clopidogrel (PLAVIX) 75 MG tablet Take 1 tablet (75 mg total) by mouth daily. 11/02/15  Yes Geradine Girt, DO  diltiazem (DILACOR XR) 240 MG 24 hr capsule Take 1 capsule (240 mg total) by mouth every morning. 08/07/15  Yes Ripudeep Krystal Eaton, MD  DULoxetine (CYMBALTA) 30 MG capsule Take 1 capsule (30 mg  total) by mouth daily. 09/02/15  Yes Arnoldo Morale, MD  FLUoxetine (PROZAC) 20 MG capsule Take 20 mg by mouth daily.   Yes Historical Provider, MD  folic acid (FOLVITE) 1 MG tablet Take 1 tablet (1 mg total) by mouth daily. 11/02/15  Yes Geradine Girt, DO  glipiZIDE (GLUCOTROL) 5 MG tablet Take 1 tablet (5 mg total) by mouth 2 (two) times daily before a meal. 08/07/15  Yes Ripudeep K Rai, MD  hydrALAZINE (APRESOLINE) 50 MG tablet Take 1 tablet (50 mg total) by mouth 3 (three) times  daily. 08/07/15  Yes Ripudeep Krystal Eaton, MD  ibuprofen (ADVIL,MOTRIN) 200 MG tablet Take 200 mg by mouth every 6 (six) hours as needed for headache.   Yes Historical Provider, MD  insulin aspart (NOVOLOG) 100 UNIT/ML injection Sliding scale CBG 70 - 120: 0 units CBG 121 - 150: 1 unit,  CBG 151 - 200: 2 units,  CBG 201 - 250: 3 units,  CBG 251 - 300: 5 units,  CBG 301 - 350: 7 units,  CBG 351 - 400: 9 units   CBG > 400: 9 units and notify your MD 08/07/15  Yes Ripudeep Krystal Eaton, MD  metFORMIN (GLUCOPHAGE) 1000 MG tablet Take 1 tablet (1,000 mg total) by mouth 2 (two) times daily with a meal. 08/07/15  Yes Ripudeep K Rai, MD  Multiple Vitamin (MULTIVITAMIN WITH MINERALS) TABS tablet Take 1 tablet by mouth daily. 11/02/15  Yes Geradine Girt, DO  rosuvastatin (CRESTOR) 40 MG tablet Take 1 tablet (40 mg total) by mouth every evening. 11/02/15  Yes Geradine Girt, DO  thiamine 100 MG tablet Take 1 tablet (100 mg total) by mouth daily. 11/02/15  Yes Geradine Girt, DO  triamterene-hydrochlorothiazide (MAXZIDE-25) 37.5-25 MG tablet Take 1 tablet by mouth daily. 08/07/15  Yes Ripudeep Krystal Eaton, MD   Physical Exam: Filed Vitals:   11/11/15 0615 11/11/15 0715 11/11/15 0856 11/11/15 0926  BP: 168/77 150/64 154/63   Pulse: 103 108 109   Temp:    99.2 F (37.3 C)  TempSrc:    Oral  Resp:  22 26   Height:      Weight:      SpO2: 100% 92% 91%     Wt Readings from Last 3 Encounters:  11/11/15 73.936 kg (163 lb)  11/01/15 73.936 kg (163 lb)  09/16/15 74.208 kg (163 lb 9.6 oz)    General:  Appears calm and comfortable Eyes:  PERRL, EOMI, normal lids, iris ENT:  grossly normal hearing, lips & tongue Neck:  no LAD, masses or thyromegaly Cardiovascular:  RRR,no m/r/g, no LE edema.  Respiratory: increased effort. Diffuse wheezing.  Abdomen:  soft, ntnd Skin:  no rash or induration seen on limited exam Musculoskeletal:  grossly normal tone BUE/BLE Psychiatric:  grossly normal mood and affect, speech fluent and  appropriate Neurologic:  CN 2-12 grossly intact, moves all extremities in coordinated fashion.          Labs on Admission:  Basic Metabolic Panel:  Recent Labs Lab 11/11/15 0119  NA 135  K 3.7  CL 95*  CO2 25  GLUCOSE 273*  BUN 11  CREATININE 1.06*  CALCIUM 9.1   Liver Function Tests: No results for input(s): AST, ALT, ALKPHOS, BILITOT, PROT, ALBUMIN in the last 168 hours. No results for input(s): LIPASE, AMYLASE in the last 168 hours. No results for input(s): AMMONIA in the last 168 hours. CBC:  Recent Labs Lab 11/11/15 0119  WBC 8.0  NEUTROABS  5.6  HGB 11.8*  HCT 37.2  MCV 86.1  PLT 259   Cardiac Enzymes: No results for input(s): CKTOTAL, CKMB, CKMBINDEX, TROPONINI in the last 168 hours.  BNP (last 3 results) No results for input(s): BNP in the last 8760 hours.  ProBNP (last 3 results) No results for input(s): PROBNP in the last 8760 hours.   CREATININE: 1.06 mg/dL ABNORMAL (11/11/15 0119) Estimated creatinine clearance - 59 mL/min  CBG:  Recent Labs Lab 11/11/15 0932  GLUCAP 291*    Radiological Exams on Admission: Dg Chest 2 View  11/11/2015  CLINICAL DATA:  Persistent cough and wheezing for 2 days. History of CHF. EXAM: CHEST  2 VIEW COMPARISON:  02/09/2013 FINDINGS: The heart size and mediastinal contours are within normal limits. Both lungs are clear. The visualized skeletal structures are unremarkable. IMPRESSION: No active cardiopulmonary disease. Electronically Signed   By: Lucienne Capers M.D.   On: 11/11/2015 02:05   Ct Angio Chest Pe W/cm &/or Wo Cm  11/11/2015  CLINICAL DATA:  Dry cough.  Wheezing.  Chest congestion. EXAM: CT ANGIOGRAPHY CHEST WITH CONTRAST TECHNIQUE: Multidetector CT imaging of the chest was performed using the standard protocol during bolus administration of intravenous contrast. Multiplanar CT image reconstructions and MIPs were obtained to evaluate the vascular anatomy. CONTRAST:  151mL OMNIPAQUE IOHEXOL 350 MG/ML SOLN  COMPARISON:  Radiography same day FINDINGS: Pulmonary arterial opacification is good. There are no pulmonary emboli. No aortic pathology is seen. The patient does have fairly extensive coronary artery calcification. There is no pleural or pericardial fluid. No consolidation or collapse. There are degenerative changes of the spine with large bridging osteophytes, particularly on the right side in the lower thoracic region. These indent the pleura in the medial aspect of the right lower lobe with there is some density that probably represents chronic scarring related to the osteophytic incursion. This less likely represents a medial right lower lobe pneumonia. No mass lesion. Scans in the upper abdomen are unremarkable. Review of the MIP images confirms the above findings. IMPRESSION: No pulmonary emboli. Probably no active pulmonary disease. Density in the medial right lower lobe is felt secondary to chronic scarring from adjacent large osteophytes rather than right lower lobe pneumonia. Coronary artery calcification. Electronically Signed   By: Nelson Chimes M.D.   On: 11/11/2015 07:01    Assessment/Plan Active Problems:   Hypertension   Type 2 diabetes mellitus with circulatory disorder (HCC)   HLD (hyperlipidemia)   Chronic diastolic CHF (congestive heart failure) (HCC)   Acute respiratory failure (HCC)  Acute respiratory failure: O2 sats dropped to 85% off O2. likely secondary to asthma flare. Pt initially tachypneic and w/ poor air movement per EDP. After solumedrol and nebulizer treatments she is markedly better. Patient is afebrile, WBC normal with normal differential, troponin negative, CTA with evidence of lung scarring but no evidence of pneumonia. Patient has a mild dry cough. Symptoms are fairly classic for asthma as came on fairly rapidly with the recent labile whether when she kept her windows open at her home.  - Observation - Solu-Medrol 60 mg every 8 - Duo nebs every 4 hours - O2 when  necessary - consider ABX and workup for HCAP if condition worsens.   Recent TIA: pt admitted recently for TIA - continue ASA, plavix, Statin  Diabetes: Patient noted to have a glucose of 277 at time of ED evaluation.  - SSI - A1c  Chest tightness: likely from respiratory failure/asthma flare. Trop neg. EKG w/o ACS -  Cycle trop - EKG in am - Tele  HTN: Elevation likely secondary to stress and ongoing respiratory therapies - hydralazine, Maxzide,   H/o cocaine and ETIH abuse: - UDS - ETOH level - CIWA (continue vitamins)  Code Status: FULL  DVT Prophylaxis: lovenox Family Communication: none Disposition Plan: Pending Improvement    Eldon Zietlow J, MD Family Medicine [facility] [email or web address] Password [email or web address]

## 2015-11-11 NOTE — ED Notes (Signed)
Pt. reports asthma with persistent dry cough , wheezing and chest congestion unrelieved by inhaler at home onset 2 days ago , denies fever or chills.

## 2015-11-12 DIAGNOSIS — J9601 Acute respiratory failure with hypoxia: Secondary | ICD-10-CM | POA: Diagnosis not present

## 2015-11-12 DIAGNOSIS — J45909 Unspecified asthma, uncomplicated: Secondary | ICD-10-CM | POA: Diagnosis present

## 2015-11-12 DIAGNOSIS — I10 Essential (primary) hypertension: Secondary | ICD-10-CM | POA: Diagnosis not present

## 2015-11-12 DIAGNOSIS — J45901 Unspecified asthma with (acute) exacerbation: Secondary | ICD-10-CM | POA: Diagnosis not present

## 2015-11-12 LAB — COMPREHENSIVE METABOLIC PANEL
ALK PHOS: 90 U/L (ref 38–126)
ALT: 20 U/L (ref 14–54)
ANION GAP: 15 (ref 5–15)
AST: 24 U/L (ref 15–41)
Albumin: 3.8 g/dL (ref 3.5–5.0)
BUN: 16 mg/dL (ref 6–20)
CALCIUM: 9.3 mg/dL (ref 8.9–10.3)
CO2: 24 mmol/L (ref 22–32)
CREATININE: 1.15 mg/dL — AB (ref 0.44–1.00)
Chloride: 97 mmol/L — ABNORMAL LOW (ref 101–111)
GFR, EST NON AFRICAN AMERICAN: 52 mL/min — AB (ref >60)
Glucose, Bld: 338 mg/dL — ABNORMAL HIGH (ref 65–99)
Potassium: 4.4 mmol/L (ref 3.5–5.1)
SODIUM: 136 mmol/L (ref 135–145)
Total Bilirubin: 0.3 mg/dL (ref 0.3–1.2)
Total Protein: 7.5 g/dL (ref 6.5–8.1)

## 2015-11-12 LAB — CBC
HCT: 40.6 % (ref 36.0–46.0)
HEMOGLOBIN: 12.8 g/dL (ref 12.0–15.0)
MCH: 27.5 pg (ref 26.0–34.0)
MCHC: 31.5 g/dL (ref 30.0–36.0)
MCV: 87.3 fL (ref 78.0–100.0)
PLATELETS: 322 10*3/uL (ref 150–400)
RBC: 4.65 MIL/uL (ref 3.87–5.11)
RDW: 13.6 % (ref 11.5–15.5)
WBC: 20.6 10*3/uL — ABNORMAL HIGH (ref 4.0–10.5)

## 2015-11-12 LAB — GLUCOSE, CAPILLARY
GLUCOSE-CAPILLARY: 353 mg/dL — AB (ref 65–99)
Glucose-Capillary: 284 mg/dL — ABNORMAL HIGH (ref 65–99)
Glucose-Capillary: 314 mg/dL — ABNORMAL HIGH (ref 65–99)
Glucose-Capillary: 248 mg/dL — ABNORMAL HIGH (ref 65–99)

## 2015-11-12 MED ORDER — LEVALBUTEROL HCL 1.25 MG/0.5ML IN NEBU
1.2500 mg | INHALATION_SOLUTION | Freq: Four times a day (QID) | RESPIRATORY_TRACT | Status: DC | PRN
Start: 2015-11-12 — End: 2015-11-16
  Administered 2015-11-12 – 2015-11-14 (×4): 1.25 mg via RESPIRATORY_TRACT
  Filled 2015-11-12 (×4): qty 0.5

## 2015-11-12 MED ORDER — BENZONATATE 100 MG PO CAPS
100.0000 mg | ORAL_CAPSULE | Freq: Three times a day (TID) | ORAL | Status: DC | PRN
  Administered 2015-11-12 (×2): 100 mg via ORAL
  Filled 2015-11-12 (×2): qty 1

## 2015-11-12 MED ORDER — DILTIAZEM HCL ER 240 MG PO CP24
240.0000 mg | ORAL_CAPSULE | Freq: Every morning | ORAL | Status: DC
  Filled 2015-11-12 (×2): qty 2
  Filled 2015-11-12: qty 1

## 2015-11-12 MED ORDER — DILTIAZEM HCL ER COATED BEADS 120 MG PO CP24
240.0000 mg | ORAL_CAPSULE | Freq: Every day | ORAL | Status: DC
  Administered 2015-11-12 – 2015-11-16 (×5): 240 mg via ORAL
  Filled 2015-11-12 (×4): qty 2

## 2015-11-12 NOTE — Progress Notes (Signed)
[facility] PROGRESS NOTE  Lisa Anderson [id number] DOB: [birth year] DOA: 11/11/2015 PCP: No primary care provider on file.  Assessment/Plan: Active Problems:   Acute respiratory failure ([place]) - Secondary to asthma exacerbation continue current steroid regimen, Xopenex, Tessalon Perles for cough suppression  Leukocytosis - Most likely response to steroid administration. - reassess hemoglobin next a.m.    Hypertension -Patient on diltiazem, hydralazine, Maxide - Added home diltiazem regimen today suspect this will help heart rate and elevated blood pressures.    Type 2 diabetes mellitus with circulatory disorder (HCC) - continue ssi with night coverage    HLD (hyperlipidemia) - stable continue crestor    Chronic diastolic CHF (congestive heart failure) (HCC) - seems compensated - saline lock patient.    Code Status: Full Family Communication: Pending improvement in condition Disposition Plan: Pending improvement in respiratory condition   Consultants:  None  Procedures:  None  Antibiotics:  None  HPI/Subjective: Patient has no new complaints today.  Objective: Filed Vitals:   11/12/15 0518 11/12/15 0805  BP: 140/65 179/73  Pulse: 105 130  Temp: 98.2 F (36.8 C) 98.2 F (36.8 C)  Resp: 20 24    Intake/Output Summary (Last 24 hours) at 11/12/15 1658 Last data filed at 11/12/15 1400  Gross per 24 hour  Intake   2880 ml  Output    350 ml  Net   2530 ml   Filed Weights   11/11/15 0110  Weight: 73.936 kg (163 lb)    Exam:   General:  Patient in no acute distress, alert and awake  Cardiovascular: Regular rate and rhythm, no murmurs rubs  Respiratory: No increased work of breathing, no wheezes  Abdomen: Soft, nondistended, nontender  Musculoskeletal: No cyanosis or clubbing  Data Reviewed: Basic Metabolic Panel:  Recent Labs Lab 11/11/15 0119 11/12/15 0558  NA 135 136  K 3.7 4.4  CL 95* 97*  CO2 25 24  GLUCOSE 273* 338*   BUN 11 16  CREATININE 1.06* 1.15*  CALCIUM 9.1 9.3   Liver Function Tests:  Recent Labs Lab 11/12/15 0558  AST 24  ALT 20  ALKPHOS 90  BILITOT 0.3  PROT 7.5  ALBUMIN 3.8   No results for input(s): LIPASE, AMYLASE in the last 168 hours. No results for input(s): AMMONIA in the last 168 hours. CBC:  Recent Labs Lab 11/11/15 0119 11/12/15 0558  WBC 8.0 20.6*  NEUTROABS 5.6  --   HGB 11.8* 12.8  HCT 37.2 40.6  MCV 86.1 87.3  PLT 259 322   Cardiac Enzymes: No results for input(s): CKTOTAL, CKMB, CKMBINDEX, TROPONINI in the last 168 hours. BNP (last 3 results) No results for input(s): BNP in the last 8760 hours.  ProBNP (last 3 results) No results for input(s): PROBNP in the last 8760 hours.  CBG:  Recent Labs Lab 11/11/15 1148 11/11/15 1739 11/11/15 2243 11/12/15 0745 11/12/15 1202  GLUCAP 456* 374* 238* 353* 284*    No results found for this or any previous visit (from the past 240 hour(s)).   Studies: Dg Chest 2 View  11/11/2015  CLINICAL DATA:  Persistent cough and wheezing for 2 days. History of CHF. EXAM: CHEST  2 VIEW COMPARISON:  02/09/2013 FINDINGS: The heart size and mediastinal contours are within normal limits. Both lungs are clear. The visualized skeletal structures are unremarkable. IMPRESSION: No active cardiopulmonary disease. Electronically Signed   By: Lucienne Capers M.D.   On: 11/11/2015 02:05   Ct Angio Chest Pe W/cm &/or Wo Cm  11/11/2015  CLINICAL DATA:  Dry cough.  Wheezing.  Chest congestion. EXAM: CT ANGIOGRAPHY CHEST WITH CONTRAST TECHNIQUE: Multidetector CT imaging of the chest was performed using the standard protocol during bolus administration of intravenous contrast. Multiplanar CT image reconstructions and MIPs were obtained to evaluate the vascular anatomy. CONTRAST:  157mL OMNIPAQUE IOHEXOL 350 MG/ML SOLN COMPARISON:  Radiography same day FINDINGS: Pulmonary arterial opacification is good. There are no pulmonary emboli. No  aortic pathology is seen. The patient does have fairly extensive coronary artery calcification. There is no pleural or pericardial fluid. No consolidation or collapse. There are degenerative changes of the spine with large bridging osteophytes, particularly on the right side in the lower thoracic region. These indent the pleura in the medial aspect of the right lower lobe with there is some density that probably represents chronic scarring related to the osteophytic incursion. This less likely represents a medial right lower lobe pneumonia. No mass lesion. Scans in the upper abdomen are unremarkable. Review of the MIP images confirms the above findings. IMPRESSION: No pulmonary emboli. Probably no active pulmonary disease. Density in the medial right lower lobe is felt secondary to chronic scarring from adjacent large osteophytes rather than right lower lobe pneumonia. Coronary artery calcification. Electronically Signed   By: Nelson Chimes M.D.   On: 11/11/2015 07:01    Scheduled Meds: . clopidogrel  75 mg Oral Daily  . diltiazem  240 mg Oral Daily  . DULoxetine  30 mg Oral Daily  . enoxaparin (LOVENOX) injection  40 mg Subcutaneous Daily  . FLUoxetine  20 mg Oral Daily  . folic acid  1 mg Oral Daily  . hydrALAZINE  50 mg Oral TID  . insulin aspart  0-20 Units Subcutaneous TID WC  . insulin aspart  0-5 Units Subcutaneous QHS  . methylPREDNISolone (SOLU-MEDROL) injection  60 mg Intravenous Q12H  . multivitamin with minerals  1 tablet Oral Daily  . rosuvastatin  40 mg Oral QPM  . sodium chloride flush  3 mL Intravenous Q12H  . thiamine  100 mg Oral Daily   Or  . thiamine  100 mg Intravenous Daily  . triamterene-hydrochlorothiazide  1 tablet Oral Daily   Continuous Infusions: . sodium chloride 75 mL/hr at 11/12/15 1608    Time spent: > 35 minutes   Velvet Bathe  [facility] Pager [phone]. If 7PM-7AM, please contact night-coverage at [email or web address], password [facility] 11/12/2015, 4:58 PM

## 2015-11-12 NOTE — Care Management (Signed)
Noted referral for medication assistance. Per adm record pt is insured and therefore would not qualify for hospital assistance with medications. If the pt is started on new meds with manufacture assistance this CM will provide appropriate information. Will discuss copays with pt at a later time, given pt need for rest at this time. Jasmine Pang RN MPH, case manager, [phone]

## 2015-11-13 ENCOUNTER — Encounter: Payer: Self-pay | Admitting: Cardiology

## 2015-11-13 DIAGNOSIS — I5032 Chronic diastolic (congestive) heart failure: Secondary | ICD-10-CM | POA: Diagnosis not present

## 2015-11-13 DIAGNOSIS — E1159 Type 2 diabetes mellitus with other circulatory complications: Secondary | ICD-10-CM

## 2015-11-13 DIAGNOSIS — J471 Bronchiectasis with (acute) exacerbation: Secondary | ICD-10-CM

## 2015-11-13 DIAGNOSIS — E785 Hyperlipidemia, unspecified: Secondary | ICD-10-CM | POA: Diagnosis not present

## 2015-11-13 DIAGNOSIS — J441 Chronic obstructive pulmonary disease with (acute) exacerbation: Secondary | ICD-10-CM

## 2015-11-13 DIAGNOSIS — F329 Major depressive disorder, single episode, unspecified: Secondary | ICD-10-CM

## 2015-11-13 DIAGNOSIS — J9601 Acute respiratory failure with hypoxia: Secondary | ICD-10-CM | POA: Diagnosis not present

## 2015-11-13 DIAGNOSIS — I10 Essential (primary) hypertension: Secondary | ICD-10-CM | POA: Diagnosis not present

## 2015-11-13 LAB — GLUCOSE, CAPILLARY
GLUCOSE-CAPILLARY: 178 mg/dL — AB (ref 65–99)
Glucose-Capillary: 276 mg/dL — ABNORMAL HIGH (ref 65–99)
Glucose-Capillary: 455 mg/dL — ABNORMAL HIGH (ref 65–99)
Glucose-Capillary: 303 mg/dL — ABNORMAL HIGH (ref 65–99)

## 2015-11-13 LAB — CBC
HCT: 39.9 % (ref 36.0–46.0)
Hemoglobin: 12.8 g/dL (ref 12.0–15.0)
MCH: 27.6 pg (ref 26.0–34.0)
MCHC: 32.1 g/dL (ref 30.0–36.0)
MCV: 86.2 fL (ref 78.0–100.0)
PLATELETS: 316 10*3/uL (ref 150–400)
RBC: 4.63 MIL/uL (ref 3.87–5.11)
RDW: 13.7 % (ref 11.5–15.5)
WBC: 21.8 10*3/uL — ABNORMAL HIGH (ref 4.0–10.5)

## 2015-11-13 MED ORDER — DOXYCYCLINE HYCLATE 100 MG PO TABS
100.0000 mg | ORAL_TABLET | Freq: Two times a day (BID) | ORAL | Status: DC
  Administered 2015-11-13 – 2015-11-16 (×6): 100 mg via ORAL
  Filled 2015-11-13 (×6): qty 1

## 2015-11-13 MED ORDER — BUDESONIDE 0.25 MG/2ML IN SUSP
0.2500 mg | Freq: Two times a day (BID) | RESPIRATORY_TRACT | Status: DC
  Administered 2015-11-13 – 2015-11-16 (×6): 0.25 mg via RESPIRATORY_TRACT
  Filled 2015-11-13 (×6): qty 2

## 2015-11-13 MED ORDER — INSULIN GLARGINE 100 UNIT/ML ~~LOC~~ SOLN
12.0000 [IU] | Freq: Every day | SUBCUTANEOUS | Status: DC
  Administered 2015-11-13 – 2015-11-15 (×3): 12 [IU] via SUBCUTANEOUS
  Filled 2015-11-13 (×4): qty 0.12

## 2015-11-13 NOTE — Care Management Note (Addendum)
Case Management Note  Patient Details  Name: Lisa Anderson MRN: [id number] Date of Birth: [birth year]  Subjective/Objective:               CM following for progression and d/c planning.     Action/Plan: 11/13/2015 Met with pt re d/c needs and medications. This pt has insurance with [employer], provided by her employer, however has been unable to afford her copays as some are as much a $80. She has been purchasing lower cost meds only. She is on Crestor which is cost prohabitive for her, will ask MD to reeval this pt for possible lower cost substitute.  Cymbalta is also very expensive for her as well as her Cardiozem . This CM attempted to find programs to assist with these meds, however, because the pt has insurance she does not qualify for such programs even with a lower income.  Information on contacts for medication assistance place in d/c instructions. [employer] for assistance with Crestor and Cardiozem, since pt has Licensed conveyancer for Cymbalta has no assistance available.  Expected Discharge Date:                  Expected Discharge Plan:  Home/Self Care  In-House Referral:  NA  Discharge planning Services  CM Consult, Medication Assistance  Post Acute Care Choice:    Choice offered to:     DME Arranged:    DME Agency:     HH Arranged:    HH Agency:     Status of Service:  In process, will continue to follow  Medicare Important Message Given:    Date Medicare IM Given:    Medicare IM give by:    Date Additional Medicare IM Given:    Additional Medicare Important Message give by:     If discussed at [place] of Stay Meetings, dates discussed:    Additional Comments:  Adron Bene, RN 11/13/2015, 12:08 PM

## 2015-11-13 NOTE — Progress Notes (Signed)
Inpatient Diabetes Program Recommendations  AACE/ADA: New Consensus Statement on Inpatient Glycemic Control (2015)  Target Ranges:  Prepandial:   less than 140 mg/dL      Peak postprandial:   less than 180 mg/dL (1-2 hours)      Critically ill patients:  140 - 180 mg/dL   Results for Lisa Anderson, Lisa Anderson (MRN [id number]) as of 11/13/2015 10:56  Ref. Range 11/12/2015 07:45 11/12/2015 12:02 11/12/2015 16:36 11/12/2015 21:26  Glucose-Capillary Latest Ref Range: 65-99 mg/dL 353 (H) 284 (H) 314 (H) 248 (H)   Results for Lisa Anderson, Lisa Anderson (MRN [id number]) as of 11/13/2015 10:56  Ref. Range 11/13/2015 07:35  Glucose-Capillary Latest Ref Range: 65-99 mg/dL 276 (H)    Home DM Meds: Novolog 0-9 units SSI       Metformin 1000 mg bid       Glipizide 5 mg bid        Current Insulin Orders: Novolog Resistant SSI (0-20 units) TID AC + HS      -Patient currently receiving IV Solumedrol 60 mg bid.  -Glucose levels consistently elevated.    MD- Please consider the following in-hospital insulin adjustments while patient getting IV steroids:  1. Start Lantus 15 units daily (0.2 units/kg dosing)  2. Start Novolog Meal Coverage- Novolog 3 units tidwc     --Will follow patient during hospitalization--  Wyn Quaker RN, MSN, CDE Diabetes Coordinator Inpatient Glycemic Control Team Team Pager: [phone] (8a-5p)

## 2015-11-13 NOTE — Progress Notes (Signed)
[facility] PROGRESS NOTE  Lisa Anderson [id number] DOB: June 18, 1956 DOA: 11/11/2015 PCP: No primary care provider on file.  Assessment/Plan:   Acute respiratory failure ([place]): with hypoxia -secondary to COPD exacerbation and bronchiectasis -continue steroids, nebulizer treatments with xopenex; will add pulmicort and continue O2 supplementation -will add flutter valve and doxycycline -continue PRN tessalon pearls -follow clinical response  Leukocytosis -Most likely response to steroid administration. -will follow WBC"s trend -continue empiric abx's for bronchiectasis     Hypertension -Patient on diltiazem, hydralazine and Maxide -fair control -will follow VS and adjust antihypertensive regimen as needed     Type 2 diabetes mellitus with circulatory disorder (HCC) - with hyperglycemia from steroids -will add lantus for better control -continue resistant SSI    HLD (hyperlipidemia) - stable continue crestor    Chronic diastolic CHF (congestive heart failure) ([place]) - seems compensated -follow strict I's and O's - follow daily weight and low sodium diet        Depression -continue cymbalta and prozac   Code Status: Full Family Communication: no family at bedside; case discussed with patient directly Disposition Plan: Pending improvement in respiratory condition; home when medically stable. Might need Oxygen at discharge.   Consultants:  None  Procedures:  None  Antibiotics:  Doxycycline   HPI/Subjective: Patient is afebrile; still SOB, unable to speak in full sentences, with diffuse wheezing on exam and requiring O2 supplementation   Objective: Filed Vitals:   11/13/15 1638 11/13/15 1924  BP: 152/72 126/67  Pulse: 85 88  Temp: 99.1 F (37.3 C) 98.5 F (36.9 C)  Resp: 18 20    Intake/Output Summary (Last 24 hours) at 11/13/15 2225 Last data filed at 11/13/15 1930  Gross per 24 hour  Intake    720 ml  Output    700 ml  Net     20 ml    Filed Weights   11/11/15 0110 11/12/15 2037 11/13/15 1924  Weight: 73.936 kg (163 lb) 75.2 kg (165 lb 12.6 oz) 73.2 kg (161 lb 6 oz)    Exam:   General:  Patient in mild distress with SOB; still with poor air movement, requiring O2 supplementation and diffuse wheezing. No fever and no CP  Cardiovascular: Regular rate and rhythm, no murmurs rubs  Respiratory: diffuse wheezing, unable to speak in full sentences and with poor air movement No use of accessory muscles   Abdomen: Soft, nondistended, nontender  Musculoskeletal: No cyanosis or clubbing  Data Reviewed: Basic Metabolic Panel:  Recent Labs Lab 11/11/15 0119 11/12/15 0558  NA 135 136  K 3.7 4.4  CL 95* 97*  CO2 25 24  GLUCOSE 273* 338*  BUN 11 16  CREATININE 1.06* 1.15*  CALCIUM 9.1 9.3   Liver Function Tests:  Recent Labs Lab 11/12/15 0558  AST 24  ALT 20  ALKPHOS 90  BILITOT 0.3  PROT 7.5  ALBUMIN 3.8   CBC:  Recent Labs Lab 11/11/15 0119 11/12/15 0558 11/13/15 0738  WBC 8.0 20.6* 21.8*  NEUTROABS 5.6  --   --   HGB 11.8* 12.8 12.8  HCT 37.2 40.6 39.9  MCV 86.1 87.3 86.2  PLT 259 322 316    ProBNP (last 3 results) No results for input(s): PROBNP in the last 8760 hours.  CBG:  Recent Labs Lab 11/12/15 2126 11/13/15 0735 11/13/15 1135 11/13/15 1607 11/13/15 2101  GLUCAP 248* 276* 455* 178* 303*    Studies: No results found.  Scheduled Meds: . budesonide (PULMICORT) nebulizer solution  0.25 mg  Nebulization BID  . clopidogrel  75 mg Oral Daily  . diltiazem  240 mg Oral Daily  . doxycycline  100 mg Oral Q12H  . DULoxetine  30 mg Oral Daily  . enoxaparin (LOVENOX) injection  40 mg Subcutaneous Daily  . FLUoxetine  20 mg Oral Daily  . folic acid  1 mg Oral Daily  . hydrALAZINE  50 mg Oral TID  . insulin aspart  0-20 Units Subcutaneous TID WC  . insulin aspart  0-5 Units Subcutaneous QHS  . insulin glargine  12 Units Subcutaneous QHS  . methylPREDNISolone (SOLU-MEDROL)  injection  60 mg Intravenous Q12H  . multivitamin with minerals  1 tablet Oral Daily  . rosuvastatin  40 mg Oral QPM  . sodium chloride flush  3 mL Intravenous Q12H  . thiamine  100 mg Oral Daily   Or  . thiamine  100 mg Intravenous Daily  . triamterene-hydrochlorothiazide  1 tablet Oral Daily   Continuous Infusions:    Time spent: > 35 minutes   Barton Dubois  [facility] Pager [phone]. If 7PM-7AM, please contact night-coverage at [email or web address], password [facility] 11/13/2015, 10:25 PM  LOS: 1 day

## 2015-11-13 NOTE — Progress Notes (Signed)
SATURATION QUALIFICATIONS:   Patient Saturations on Room Air at Rest = 87-89%  Patients Saturations on 2 L at Rest = 92-96%  Patient Saturations on 2 Liters of oxygen while Ambulating = >90%

## 2015-11-14 DIAGNOSIS — D72829 Elevated white blood cell count, unspecified: Secondary | ICD-10-CM | POA: Diagnosis present

## 2015-11-14 DIAGNOSIS — T380X5A Adverse effect of glucocorticoids and synthetic analogues, initial encounter: Secondary | ICD-10-CM | POA: Diagnosis present

## 2015-11-14 DIAGNOSIS — Z7951 Long term (current) use of inhaled steroids: Secondary | ICD-10-CM | POA: Diagnosis not present

## 2015-11-14 DIAGNOSIS — Z8673 Personal history of transient ischemic attack (TIA), and cerebral infarction without residual deficits: Secondary | ICD-10-CM | POA: Diagnosis not present

## 2015-11-14 DIAGNOSIS — Z91013 Allergy to seafood: Secondary | ICD-10-CM | POA: Diagnosis not present

## 2015-11-14 DIAGNOSIS — E785 Hyperlipidemia, unspecified: Secondary | ICD-10-CM | POA: Diagnosis present

## 2015-11-14 DIAGNOSIS — Z79899 Other long term (current) drug therapy: Secondary | ICD-10-CM | POA: Diagnosis not present

## 2015-11-14 DIAGNOSIS — J441 Chronic obstructive pulmonary disease with (acute) exacerbation: Secondary | ICD-10-CM | POA: Diagnosis present

## 2015-11-14 DIAGNOSIS — I5032 Chronic diastolic (congestive) heart failure: Secondary | ICD-10-CM | POA: Diagnosis present

## 2015-11-14 DIAGNOSIS — R062 Wheezing: Secondary | ICD-10-CM | POA: Diagnosis present

## 2015-11-14 DIAGNOSIS — J9601 Acute respiratory failure with hypoxia: Secondary | ICD-10-CM | POA: Diagnosis present

## 2015-11-14 DIAGNOSIS — E1159 Type 2 diabetes mellitus with other circulatory complications: Secondary | ICD-10-CM | POA: Diagnosis present

## 2015-11-14 DIAGNOSIS — F329 Major depressive disorder, single episode, unspecified: Secondary | ICD-10-CM | POA: Diagnosis present

## 2015-11-14 DIAGNOSIS — Z7984 Long term (current) use of oral hypoglycemic drugs: Secondary | ICD-10-CM | POA: Diagnosis not present

## 2015-11-14 DIAGNOSIS — Z853 Personal history of malignant neoplasm of breast: Secondary | ICD-10-CM | POA: Diagnosis not present

## 2015-11-14 DIAGNOSIS — F17210 Nicotine dependence, cigarettes, uncomplicated: Secondary | ICD-10-CM | POA: Diagnosis present

## 2015-11-14 DIAGNOSIS — E1165 Type 2 diabetes mellitus with hyperglycemia: Secondary | ICD-10-CM | POA: Diagnosis present

## 2015-11-14 DIAGNOSIS — J479 Bronchiectasis, uncomplicated: Secondary | ICD-10-CM | POA: Diagnosis present

## 2015-11-14 DIAGNOSIS — Z794 Long term (current) use of insulin: Secondary | ICD-10-CM | POA: Diagnosis not present

## 2015-11-14 DIAGNOSIS — I10 Essential (primary) hypertension: Secondary | ICD-10-CM | POA: Diagnosis not present

## 2015-11-14 DIAGNOSIS — Z888 Allergy status to other drugs, medicaments and biological substances status: Secondary | ICD-10-CM | POA: Diagnosis not present

## 2015-11-14 DIAGNOSIS — I2510 Atherosclerotic heart disease of native coronary artery without angina pectoris: Secondary | ICD-10-CM | POA: Diagnosis present

## 2015-11-14 DIAGNOSIS — Z7902 Long term (current) use of antithrombotics/antiplatelets: Secondary | ICD-10-CM | POA: Diagnosis not present

## 2015-11-14 DIAGNOSIS — J45901 Unspecified asthma with (acute) exacerbation: Secondary | ICD-10-CM | POA: Diagnosis present

## 2015-11-14 DIAGNOSIS — I110 Hypertensive heart disease with heart failure: Secondary | ICD-10-CM | POA: Diagnosis present

## 2015-11-14 LAB — GLUCOSE, CAPILLARY
GLUCOSE-CAPILLARY: 302 mg/dL — AB (ref 65–99)
Glucose-Capillary: 321 mg/dL — ABNORMAL HIGH (ref 65–99)
Glucose-Capillary: 257 mg/dL — ABNORMAL HIGH (ref 65–99)
Glucose-Capillary: 267 mg/dL — ABNORMAL HIGH (ref 65–99)

## 2015-11-14 MED ORDER — LEVALBUTEROL HCL 1.25 MG/0.5ML IN NEBU
1.2500 mg | INHALATION_SOLUTION | Freq: Three times a day (TID) | RESPIRATORY_TRACT | Status: DC
  Administered 2015-11-15 – 2015-11-16 (×5): 1.25 mg via RESPIRATORY_TRACT
  Filled 2015-11-14 (×5): qty 0.5

## 2015-11-14 MED ORDER — DM-GUAIFENESIN ER 30-600 MG PO TB12
1.0000 | ORAL_TABLET | Freq: Two times a day (BID) | ORAL | Status: DC
  Administered 2015-11-14 – 2015-11-16 (×4): 1 via ORAL
  Filled 2015-11-14 (×4): qty 1

## 2015-11-14 NOTE — Progress Notes (Signed)
[facility] PROGRESS NOTE  Lisa Anderson [id number] DOB: 10-22-1955 DOA: 11/11/2015 PCP: No primary care provider on file.  Assessment/Plan: Active Problems:   Acute respiratory failure ([place]) - Secondary to asthma exacerbation continue current steroid regimen, Xopenex, Tessalon Perles for cough suppression  Leukocytosis - Most likely response to steroid administration. - reassess hemoglobin next a.m.    Hypertension -Patient on diltiazem, hydralazine, Maxide - Added home diltiazem regimen today suspect this will help heart rate and elevated blood pressures.    Type 2 diabetes mellitus with circulatory disorder (HCC) - continue ssi with night coverage    HLD (hyperlipidemia) - stable continue crestor    Chronic diastolic CHF (congestive heart failure) (HCC) - seems compensated - saline lock patient.  Code Status: Full Family Communication: Pending improvement in condition Disposition Plan: Will wean to room air as tolerated with continued improvement   Consultants:  None  Procedures:  None  Antibiotics:  Doxycycline  HPI/Subjective: Patient has no new complaints today. Reports she is still short of breath with activity  Objective: Filed Vitals:   11/14/15 0930 11/14/15 1359  BP: 156/65 150/70  Pulse: 92 93  Temp: 98.1 F (36.7 C) 98.1 F (36.7 C)  Resp: 18 18    Intake/Output Summary (Last 24 hours) at 11/14/15 1618 Last data filed at 11/14/15 1053  Gross per 24 hour  Intake    600 ml  Output   1150 ml  Net   -550 ml   Filed Weights   11/12/15 2037 11/13/15 1924 11/14/15 0500  Weight: 75.2 kg (165 lb 12.6 oz) 73.2 kg (161 lb 6 oz) 73.2 kg (161 lb 6 oz)    Exam:   General:  Patient in no acute distress, alert and awake  Cardiovascular: Regular rate and rhythm, no murmurs rubs  Respiratory: No increased work of breathing, no wheezes  Abdomen: Soft, nondistended, nontender  Musculoskeletal: No cyanosis or clubbing  Data  Reviewed: Basic Metabolic Panel:  Recent Labs Lab 11/11/15 0119 11/12/15 0558  NA 135 136  K 3.7 4.4  CL 95* 97*  CO2 25 24  GLUCOSE 273* 338*  BUN 11 16  CREATININE 1.06* 1.15*  CALCIUM 9.1 9.3   Liver Function Tests:  Recent Labs Lab 11/12/15 0558  AST 24  ALT 20  ALKPHOS 90  BILITOT 0.3  PROT 7.5  ALBUMIN 3.8   No results for input(s): LIPASE, AMYLASE in the last 168 hours. No results for input(s): AMMONIA in the last 168 hours. CBC:  Recent Labs Lab 11/11/15 0119 11/12/15 0558 11/13/15 0738  WBC 8.0 20.6* 21.8*  NEUTROABS 5.6  --   --   HGB 11.8* 12.8 12.8  HCT 37.2 40.6 39.9  MCV 86.1 87.3 86.2  PLT 259 322 316   Cardiac Enzymes: No results for input(s): CKTOTAL, CKMB, CKMBINDEX, TROPONINI in the last 168 hours. BNP (last 3 results) No results for input(s): BNP in the last 8760 hours.  ProBNP (last 3 results) No results for input(s): PROBNP in the last 8760 hours.  CBG:  Recent Labs Lab 11/13/15 1135 11/13/15 1607 11/13/15 2101 11/14/15 0754 11/14/15 1153  GLUCAP 455* 178* 303* 302* 321*    No results found for this or any previous visit (from the past 240 hour(s)).   Studies: No results found.  Scheduled Meds: . budesonide (PULMICORT) nebulizer solution  0.25 mg Nebulization BID  . clopidogrel  75 mg Oral Daily  . dextromethorphan-guaiFENesin  1 tablet Oral BID  . diltiazem  240 mg Oral  Daily  . doxycycline  100 mg Oral Q12H  . DULoxetine  30 mg Oral Daily  . enoxaparin (LOVENOX) injection  40 mg Subcutaneous Daily  . FLUoxetine  20 mg Oral Daily  . folic acid  1 mg Oral Daily  . hydrALAZINE  50 mg Oral TID  . insulin aspart  0-20 Units Subcutaneous TID WC  . insulin aspart  0-5 Units Subcutaneous QHS  . insulin glargine  12 Units Subcutaneous QHS  . methylPREDNISolone (SOLU-MEDROL) injection  60 mg Intravenous Q12H  . multivitamin with minerals  1 tablet Oral Daily  . rosuvastatin  40 mg Oral QPM  . sodium chloride flush  3  mL Intravenous Q12H  . thiamine  100 mg Oral Daily  . triamterene-hydrochlorothiazide  1 tablet Oral Daily   Continuous Infusions:    Time spent: > 35 minutes   Velvet Bathe  [facility] Pager [phone]. If 7PM-7AM, please contact night-coverage at [email or web address], password [facility] 11/14/2015, 4:18 PM  LOS: 2 days

## 2015-11-15 LAB — GLUCOSE, CAPILLARY
GLUCOSE-CAPILLARY: 332 mg/dL — AB (ref 65–99)
Glucose-Capillary: 387 mg/dL — ABNORMAL HIGH (ref 65–99)
Glucose-Capillary: 460 mg/dL — ABNORMAL HIGH (ref 65–99)
Glucose-Capillary: 349 mg/dL — ABNORMAL HIGH (ref 65–99)
Glucose-Capillary: 355 mg/dL — ABNORMAL HIGH (ref 65–99)

## 2015-11-15 MED ORDER — TIOTROPIUM BROMIDE MONOHYDRATE 18 MCG IN CAPS
18.0000 ug | ORAL_CAPSULE | Freq: Every day | RESPIRATORY_TRACT | Status: DC
  Administered 2015-11-16: 18 ug via RESPIRATORY_TRACT
  Filled 2015-11-15: qty 5

## 2015-11-15 NOTE — Progress Notes (Signed)
Pt CBG 460. MD notified and orders given for 20 units novolog and diet changed to carb-mod. Will recheck in one hour.

## 2015-11-15 NOTE — Progress Notes (Signed)
[facility] PROGRESS NOTE  Lisa Anderson [id number] DOB: 1956-08-16 DOA: 11/11/2015 PCP: No primary care provider on file.  Assessment/Plan: Active Problems:   Acute respiratory failure ([place]) - Secondary to asthma exacerbation continue current steroid regimen, Xopenex, Tessalon Perles for cough suppression - Still hypoxic as such will add Spiriva - If slow improvement may have to discharge patient home on supplemental oxygen  Leukocytosis - Most likely response to steroid administration. - reassess hemoglobin next a.m.    Hypertension -Patient on diltiazem, hydralazine, Maxide - Added home diltiazem regimen today suspect this will help heart rate and elevated blood pressures.    Type 2 diabetes mellitus with circulatory disorder (HCC) - continue ssi with night coverage - placed on diabetic diet    HLD (hyperlipidemia) - stable continue crestor    Chronic diastolic CHF (congestive heart failure) ([place]) - seems compensated - saline lock patient.  Code Status: Full Family Communication: Pending improvement in condition Disposition Plan: Will wean to room air as tolerated with continued improvement, still hypoxic   Consultants:  None  Procedures:  None  Antibiotics:  Doxycycline  HPI/Subjective: Patient did not have any new complaints. Nursing called about elevated blood sugar I instructed her to give her the higher dose of the sliding scale and check blood sugar and an hour and 40 minutes.  Objective: Filed Vitals:   11/15/15 0440 11/15/15 1000  BP: 130/72 141/69  Pulse: 80 84  Temp: 98.2 F (36.8 C) 98.6 F (37 C)  Resp:  18    Intake/Output Summary (Last 24 hours) at 11/15/15 1715 Last data filed at 11/15/15 1100  Gross per 24 hour  Intake    480 ml  Output   1450 ml  Net   -970 ml   Filed Weights   11/12/15 2037 11/13/15 1924 11/14/15 0500  Weight: 75.2 kg (165 lb 12.6 oz) 73.2 kg (161 lb 6 oz) 73.2 kg (161 lb 6 oz)     Exam:   General:  Patient in no acute distress, alert and awake  Cardiovascular: Regular rate and rhythm, no murmurs rubs  Respiratory: No increased work of breathing, no wheezes  Abdomen: Soft, nondistended, nontender  Musculoskeletal: No cyanosis or clubbing  Data Reviewed: Basic Metabolic Panel:  Recent Labs Lab 11/11/15 0119 11/12/15 0558  NA 135 136  K 3.7 4.4  CL 95* 97*  CO2 25 24  GLUCOSE 273* 338*  BUN 11 16  CREATININE 1.06* 1.15*  CALCIUM 9.1 9.3   Liver Function Tests:  Recent Labs Lab 11/12/15 0558  AST 24  ALT 20  ALKPHOS 90  BILITOT 0.3  PROT 7.5  ALBUMIN 3.8   No results for input(s): LIPASE, AMYLASE in the last 168 hours. No results for input(s): AMMONIA in the last 168 hours. CBC:  Recent Labs Lab 11/11/15 0119 11/12/15 0558 11/13/15 0738  WBC 8.0 20.6* 21.8*  NEUTROABS 5.6  --   --   HGB 11.8* 12.8 12.8  HCT 37.2 40.6 39.9  MCV 86.1 87.3 86.2  PLT 259 322 316   Cardiac Enzymes: No results for input(s): CKTOTAL, CKMB, CKMBINDEX, TROPONINI in the last 168 hours. BNP (last 3 results) No results for input(s): BNP in the last 8760 hours.  ProBNP (last 3 results) No results for input(s): PROBNP in the last 8760 hours.  CBG:  Recent Labs Lab 11/14/15 1636 11/14/15 2201 11/15/15 0731 11/15/15 1147 11/15/15 1622  GLUCAP 257* 267* 332* 387* 460*    No results found for this or any  previous visit (from the past 240 hour(s)).   Studies: No results found.  Scheduled Meds: . budesonide (PULMICORT) nebulizer solution  0.25 mg Nebulization BID  . clopidogrel  75 mg Oral Daily  . dextromethorphan-guaiFENesin  1 tablet Oral BID  . diltiazem  240 mg Oral Daily  . doxycycline  100 mg Oral Q12H  . DULoxetine  30 mg Oral Daily  . enoxaparin (LOVENOX) injection  40 mg Subcutaneous Daily  . FLUoxetine  20 mg Oral Daily  . folic acid  1 mg Oral Daily  . hydrALAZINE  50 mg Oral TID  . insulin aspart  0-20 Units Subcutaneous  TID WC  . insulin aspart  0-5 Units Subcutaneous QHS  . insulin glargine  12 Units Subcutaneous QHS  . levalbuterol  1.25 mg Nebulization TID  . methylPREDNISolone (SOLU-MEDROL) injection  60 mg Intravenous Q12H  . multivitamin with minerals  1 tablet Oral Daily  . rosuvastatin  40 mg Oral QPM  . sodium chloride flush  3 mL Intravenous Q12H  . thiamine  100 mg Oral Daily  . tiotropium  18 mcg Inhalation Daily  . triamterene-hydrochlorothiazide  1 tablet Oral Daily   Continuous Infusions:    Time spent: > 35 minutes   Velvet Bathe  [facility] Pager [phone]. If 7PM-7AM, please contact night-coverage at [email or web address], password [facility] 11/15/2015, 5:15 PM  LOS: 3 days

## 2015-11-15 NOTE — Progress Notes (Signed)
Pt's O2 sats stay > 93% on RA. However, pt states she feels short of breath without O2. Currently "feeling better" on 1 L O2.

## 2015-11-16 DIAGNOSIS — E119 Type 2 diabetes mellitus without complications: Secondary | ICD-10-CM

## 2015-11-16 DIAGNOSIS — IMO0001 Reserved for inherently not codable concepts without codable children: Secondary | ICD-10-CM | POA: Insufficient documentation

## 2015-11-16 DIAGNOSIS — Z794 Long term (current) use of insulin: Secondary | ICD-10-CM

## 2015-11-16 LAB — GLUCOSE, CAPILLARY
GLUCOSE-CAPILLARY: 376 mg/dL — AB (ref 65–99)
GLUCOSE-CAPILLARY: 438 mg/dL — AB (ref 65–99)
Glucose-Capillary: 437 mg/dL — ABNORMAL HIGH (ref 65–99)

## 2015-11-16 MED ORDER — BENZONATATE 100 MG PO CAPS: 100.0000 mg | ORAL_CAPSULE | Freq: Three times a day (TID) | ORAL | Status: DC | PRN

## 2015-11-16 MED ORDER — DILTIAZEM HCL 60 MG PO TABS: 60.0000 mg | ORAL_TABLET | Freq: Four times a day (QID) | ORAL | Status: DC

## 2015-11-16 MED ORDER — ALBUTEROL SULFATE HFA 108 (90 BASE) MCG/ACT IN AERS: 2.0000 | INHALATION_SPRAY | Freq: Four times a day (QID) | RESPIRATORY_TRACT | Status: DC | PRN

## 2015-11-16 MED ORDER — DOXYCYCLINE HYCLATE 100 MG PO TABS: 100.0000 mg | ORAL_TABLET | Freq: Two times a day (BID) | ORAL | Status: DC

## 2015-11-16 NOTE — Progress Notes (Signed)
Pt's CBG 438. MD notified and orders given for 20 units Novolog insulin. Will continue to monitor.

## 2015-11-16 NOTE — Discharge Summary (Signed)
Physician Discharge Summary  Lisa Anderson [id number] DOB: 26-Jul-1956 DOA: 11/11/2015  PCP: No primary care provider on file.  Admit date: 11/11/2015 Discharge date: 11/16/2015  Time spent: > 35 minutes  Recommendations for Outpatient Follow-up:  1. Please adjust patient's home medication regimen as there were financial concerns voiced  Discharge Diagnoses:  Active Problems:   Hypertension   Type 2 diabetes mellitus with circulatory disorder (HCC)   HLD (hyperlipidemia)   Chronic diastolic CHF (congestive heart failure) ([place])   Acute respiratory failure (HCC)   Asthma exacerbation   Discharge Condition: stable  Diet recommendation: Carb modified diet  Filed Weights   11/13/15 1924 11/14/15 0500 11/15/15 2152  Weight: 73.2 kg (161 lb 6 oz) 73.2 kg (161 lb 6 oz) 72.938 kg (160 lb 12.8 oz)    History of present illness:  From original HPI:  Patient is a 60 year old African-American female who presented with asthma exacerbation.  Hospital Course:  Asthma exacerbation/COPD -Improved with prednisone doxycycline Pulmicort, Spiriva, albuterol - Patient reports having difficulty affording medications. Her condition has significantly improved as such I believe she will do well on her as needed albuterol and doxycycline. She has no wheezes on physical exam and has completed approximately 5 days of steroid use. As such I do not feel she needs any more steroids.  Otherwise we'll continue home medication regimen for known comorbidities listed above. Per patient's request she is having difficulty affording her 24 hour diltiazem medication. As such will provide prescription for this short acting diltiazem which supposedly is more affordable.  Procedures:  None  Consultations:  None  Discharge Exam: Filed Vitals:   11/15/15 2152 11/16/15 0457  BP: 153/61 138/70  Pulse: 86 76  Temp: 98.2 F (36.8 C) 98.6 F (37 C)  Resp: 18 17    General: Pt in nad, alert and  awake Cardiovascular: rrr, no mrg Respiratory: cta bl, no wheezes  Discharge Instructions   Discharge Instructions    Call MD for:  difficulty breathing, headache or visual disturbances    Complete by:  As directed      Call MD for:  temperature >100.4    Complete by:  As directed      Diet - low sodium heart healthy    Complete by:  As directed      Discharge instructions    Complete by:  As directed   Please f/u with your primary care physician within the next one or 2 weeks     Increase activity slowly    Complete by:  As directed           Current Discharge Medication List    START taking these medications   Details  benzonatate (TESSALON) 100 MG capsule Take 1 capsule (100 mg total) by mouth 3 (three) times daily as needed for cough. Qty: 20 capsule, Refills: 0    diltiazem (CARDIZEM) 60 MG tablet Take 1 tablet (60 mg total) by mouth 4 (four) times daily. Qty: 120 tablet, Refills: 0    doxycycline (VIBRA-TABS) 100 MG tablet Take 1 tablet (100 mg total) by mouth every 12 (twelve) hours. Qty: 4 tablet, Refills: 0      CONTINUE these medications which have CHANGED   Details  albuterol (PROVENTIL HFA;VENTOLIN HFA) 108 (90 Base) MCG/ACT inhaler Inhale 2 puffs into the lungs every 6 (six) hours as needed for wheezing or shortness of breath. Shortness of breath Qty: 1 Inhaler, Refills: 0      CONTINUE these medications which have NOT  CHANGED   Details  clopidogrel (PLAVIX) 75 MG tablet Take 1 tablet (75 mg total) by mouth daily. Qty: 30 tablet, Refills: 0    DULoxetine (CYMBALTA) 30 MG capsule Take 1 capsule (30 mg total) by mouth daily. Qty: 30 capsule, Refills: 0    FLUoxetine (PROZAC) 20 MG capsule Take 20 mg by mouth daily.    folic acid (FOLVITE) 1 MG tablet Take 1 tablet (1 mg total) by mouth daily.    glipiZIDE (GLUCOTROL) 5 MG tablet Take 1 tablet (5 mg total) by mouth 2 (two) times daily before a meal. Qty: 60 tablet, Refills: 3    hydrALAZINE  (APRESOLINE) 50 MG tablet Take 1 tablet (50 mg total) by mouth 3 (three) times daily. Qty: 90 tablet, Refills: 3    insulin aspart (NOVOLOG) 100 UNIT/ML injection Sliding scale CBG 70 - 120: 0 units CBG 121 - 150: 1 unit,  CBG 151 - 200: 2 units,  CBG 201 - 250: 3 units,  CBG 251 - 300: 5 units,  CBG 301 - 350: 7 units,  CBG 351 - 400: 9 units   CBG > 400: 9 units and notify your MD Qty: 1 vial, Refills: 12    metFORMIN (GLUCOPHAGE) 1000 MG tablet Take 1 tablet (1,000 mg total) by mouth 2 (two) times daily with a meal. Qty: 60 tablet, Refills: 3    Multiple Vitamin (MULTIVITAMIN WITH MINERALS) TABS tablet Take 1 tablet by mouth daily.    rosuvastatin (CRESTOR) 40 MG tablet Take 1 tablet (40 mg total) by mouth every evening. Qty: 30 tablet, Refills: 0    thiamine 100 MG tablet Take 1 tablet (100 mg total) by mouth daily. Qty: 30 tablet, Refills: 0    triamterene-hydrochlorothiazide (MAXZIDE-25) 37.5-25 MG tablet Take 1 tablet by mouth daily. Qty: 30 tablet, Refills: 3      STOP taking these medications     diltiazem (DILACOR XR) 240 MG 24 hr capsule      ibuprofen (ADVIL,MOTRIN) 200 MG tablet        Allergies  Allergen Reactions  . Prednisone Hives and Other (See Comments)    Made her "crazy", suicidal  . Benicar [Olmesartan] Other (See Comments)    Did not work at all for patient  . Celexa [Citalopram] Other (See Comments)    Made patient feel crazy, fluoxetine is fine  . Effexor [Venlafaxine] Other (See Comments)    Made patient feel crazy  . Shrimp [Shellfish Allergy] Hives and Swelling   Follow-up Information    Please follow up.   Why:  For assistance with Crestor and Cardiozem  Lisa Anderson may contact [facility] at [phone]  to apply.       The results of significant diagnostics from this hospitalization (including imaging, microbiology, ancillary and laboratory) are listed below for reference.    Significant Diagnostic Studies: Dg Chest 2  View  11/11/2015  CLINICAL DATA:  Persistent cough and wheezing for 2 days. History of CHF. EXAM: CHEST  2 VIEW COMPARISON:  02/09/2013 FINDINGS: The heart size and mediastinal contours are within normal limits. Both lungs are clear. The visualized skeletal structures are unremarkable. IMPRESSION: No active cardiopulmonary disease. Electronically Signed   By: Lucienne Capers M.D.   On: 11/11/2015 02:05   Ct Head Wo Contrast  11/01/2015  CLINICAL DATA:  TIA.  Slurred speech EXAM: CT HEAD WITHOUT CONTRAST TECHNIQUE: Contiguous axial images were obtained from the base of the skull through the vertex without intravenous contrast.  COMPARISON:  CT and MRI 08/05/2015 FINDINGS: Chronic infarct right parietal lobe. No other areas of chronic ischemia Negative for acute infarct. Negative for acute hemorrhage. No mass or edema. Ventricle size normal.  No shift of the midline structures. Normal calvarium.  Mild mucosal edema in the paranasal sinuses. IMPRESSION: Chronic infarct right parietal cortex.  No acute abnormality. Electronically Signed   By: Franchot Gallo M.D.   On: 11/01/2015 16:49   Ct Angio Chest Pe W/cm &/or Wo Cm  11/11/2015  CLINICAL DATA:  Dry cough.  Wheezing.  Chest congestion. EXAM: CT ANGIOGRAPHY CHEST WITH CONTRAST TECHNIQUE: Multidetector CT imaging of the chest was performed using the standard protocol during bolus administration of intravenous contrast. Multiplanar CT image reconstructions and MIPs were obtained to evaluate the vascular anatomy. CONTRAST:  153mL OMNIPAQUE IOHEXOL 350 MG/ML SOLN COMPARISON:  Radiography same day FINDINGS: Pulmonary arterial opacification is good. There are no pulmonary emboli. No aortic pathology is seen. The patient does have fairly extensive coronary artery calcification. There is no pleural or pericardial fluid. No consolidation or collapse. There are degenerative changes of the spine with large bridging osteophytes, particularly on the right side in the lower  thoracic region. These indent the pleura in the medial aspect of the right lower lobe with there is some density that probably represents chronic scarring related to the osteophytic incursion. This less likely represents a medial right lower lobe pneumonia. No mass lesion. Scans in the upper abdomen are unremarkable. Review of the MIP images confirms the above findings. IMPRESSION: No pulmonary emboli. Probably no active pulmonary disease. Density in the medial right lower lobe is felt secondary to chronic scarring from adjacent large osteophytes rather than right lower lobe pneumonia. Coronary artery calcification. Electronically Signed   By: Nelson Chimes M.D.   On: 11/11/2015 07:01   Mr Brain Wo Contrast  11/02/2015  CLINICAL DATA:  60 year old diabetic hypertensive female with hyperlipidemia and cocaine abuse presenting with change in speech and left-sided paresthesias. Recent embolic stroke. Subsequent encounter. EXAM: MRI HEAD WITHOUT CONTRAST MRA HEAD WITHOUT CONTRAST TECHNIQUE: Multiplanar, multiecho pulse sequences of the brain and surrounding structures were obtained without intravenous contrast. Angiographic images of the head were obtained using MRA technique without contrast. COMPARISON:  11/01/2015 head CT.  08/05/2015 brain MR. FINDINGS: MRI HEAD FINDINGS No acute infarct or intracranial hemorrhage. Remote right parietal -occipital lobe infarct. Mild to moderate chronic small vessel disease changes. No hydrocephalus. No intracranial mass lesion noted on this unenhanced exam. Mild exophthalmos. Maxillary sinus and ethmoid sinus air cell mucosal thickening with polypoid opacification superior aspect left maxillary sinus. Polypoid opacification sphenoid sinus. Expanded partially empty sella without other findings to suggest pseudotumor cerebri MRA HEAD FINDINGS Moderate tandem stenosis left internal carotid artery pre cavernous/ cavernous segment. Mild stenosis cavernous and supraclinoid segment of the  right internal carotid artery. Moderate stenosis M1 segment right middle cerebral artery. Marked stenosis proximal M2 segment right middle cerebral artery. Decrease number of visualized right middle cerebral artery branches consistent with patient's remote infarct. 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 Fetal type contribution to the left posterior cerebral artery. Marked focal stenosis distal right vertebral artery. Mild narrowing of the right vertebral artery proximal to the takeoff of the right posterior inferior cerebellar artery. Mild narrowing and irregularity of portions of the right posterior inferior cerebellar artery. Nonvisualized left posterior inferior cerebellar artery. Mild narrowing proximal basilar artery. Moderate focal stenosis proximal and mid aspect of the posterior cerebellar artery more  notable on the right. Mild narrowing irregularity posterior cerebral artery distal branches. IMPRESSION: MRI HEAD No acute infarct or intracranial hemorrhage. Remote right parietal -occipital lobe infarct. Mild to moderate chronic small vessel disease changes. Maxillary sinus and ethmoid sinus air cell mucosal thickening with polypoid opacification superior aspect left maxillary sinus. Polypoid opacification sphenoid sinus. Expanded partially empty sella. MRA HEAD Moderate tandem stenosis left internal carotid artery pre cavernous/ cavernous segment. Mild stenosis cavernous and supraclinoid segment of the right internal carotid artery. Moderate stenosis M1 segment right middle cerebral artery. Marked stenosis proximal M2 segment right middle cerebral artery. Decrease number of visualized right middle cerebral artery branches consistent with patient's remote infarct. 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 Nonvisualized left posterior inferior cerebellar  artery. Mild narrowing proximal basilar artery. Moderate focal stenosis proximal and mid aspect of the posterior cerebellar artery more notable on the right. Mild narrowing irregularity posterior cerebral artery distal branches. Electronically Signed   By: Genia Del M.D.   On: 11/02/2015 07:03   Mr Mra Head/brain Wo Cm  11/02/2015  CLINICAL DATA:  60 year old diabetic hypertensive female with hyperlipidemia and cocaine abuse presenting with change in speech and left-sided paresthesias. Recent embolic stroke. Subsequent encounter. EXAM: MRI HEAD WITHOUT CONTRAST MRA HEAD WITHOUT CONTRAST TECHNIQUE: Multiplanar, multiecho pulse sequences of the brain and surrounding structures were obtained without intravenous contrast. Angiographic images of the head were obtained using MRA technique without contrast. COMPARISON:  11/01/2015 head CT.  08/05/2015 brain MR. FINDINGS: MRI HEAD FINDINGS No acute infarct or intracranial hemorrhage. Remote right parietal -occipital lobe infarct. Mild to moderate chronic small vessel disease changes. No hydrocephalus. No intracranial mass lesion noted on this unenhanced exam. Mild exophthalmos. Maxillary sinus and ethmoid sinus air cell mucosal thickening with polypoid opacification superior aspect left maxillary sinus. Polypoid opacification sphenoid sinus. Expanded partially empty sella without other findings to suggest pseudotumor cerebri MRA HEAD FINDINGS Moderate tandem stenosis left internal carotid artery pre cavernous/ cavernous segment. Mild stenosis cavernous and supraclinoid segment of the right internal carotid artery. Moderate stenosis M1 segment right middle cerebral artery. Marked stenosis proximal M2 segment right middle cerebral artery. Decrease number of visualized right middle cerebral artery branches consistent with patient's remote infarct. 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 Fetal type contribution to the  left posterior cerebral artery. Marked focal stenosis distal right vertebral artery. Mild narrowing of the right vertebral artery proximal to the takeoff of the right posterior inferior cerebellar artery. Mild narrowing and irregularity of portions of the right posterior inferior cerebellar artery. Nonvisualized left posterior inferior cerebellar artery. Mild narrowing proximal basilar artery. Moderate focal stenosis proximal and mid aspect of the posterior cerebellar artery more notable on the right. Mild narrowing irregularity posterior cerebral artery distal branches. IMPRESSION: MRI HEAD No acute infarct or intracranial hemorrhage. Remote right parietal -occipital lobe infarct. Mild to moderate chronic small vessel disease changes. Maxillary sinus and ethmoid sinus air cell mucosal thickening with polypoid opacification superior aspect left maxillary sinus. Polypoid opacification sphenoid sinus. Expanded partially empty sella. MRA HEAD Moderate tandem stenosis left internal carotid artery pre cavernous/ cavernous segment. Mild stenosis cavernous and supraclinoid segment of the right internal carotid artery. Moderate stenosis M1 segment right middle cerebral artery. Marked stenosis proximal M2 segment right middle cerebral artery. Decrease number of visualized right middle cerebral artery branches consistent with patient's remote infarct. 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 Nonvisualized left posterior inferior cerebellar artery. Mild narrowing proximal basilar artery. Moderate focal stenosis proximal and mid aspect of the posterior cerebellar artery more notable on the right. Mild narrowing irregularity posterior cerebral artery distal branches. Electronically Signed   By: Genia Del M.D.   On: 11/02/2015 07:03    Microbiology: No results found for this or any previous visit (from the past 240  hour(s)).   Labs: Basic Metabolic Panel:  Recent Labs Lab 11/11/15 0119 11/12/15 0558  NA 135 136  K 3.7 4.4  CL 95* 97*  CO2 25 24  GLUCOSE 273* 338*  BUN 11 16  CREATININE 1.06* 1.15*  CALCIUM 9.1 9.3   Liver Function Tests:  Recent Labs Lab 11/12/15 0558  AST 24  ALT 20  ALKPHOS 90  BILITOT 0.3  PROT 7.5  ALBUMIN 3.8   No results for input(s): LIPASE, AMYLASE in the last 168 hours. No results for input(s): AMMONIA in the last 168 hours. CBC:  Recent Labs Lab 11/11/15 0119 11/12/15 0558 11/13/15 0738  WBC 8.0 20.6* 21.8*  NEUTROABS 5.6  --   --   HGB 11.8* 12.8 12.8  HCT 37.2 40.6 39.9  MCV 86.1 87.3 86.2  PLT 259 322 316   Cardiac Enzymes: No results for input(s): CKTOTAL, CKMB, CKMBINDEX, TROPONINI in the last 168 hours. BNP: BNP (last 3 results) No results for input(s): BNP in the last 8760 hours.  ProBNP (last 3 results) No results for input(s): PROBNP in the last 8760 hours.  CBG:  Recent Labs Lab 11/15/15 1622 11/15/15 1810 11/15/15 2151 11/16/15 0808 11/16/15 1117  GLUCAP 460* 349* 355* 376* 438*    Signed:  Velvet Bathe MD.  [facility] 11/16/2015, 3:34 PM

## 2015-11-18 ENCOUNTER — Emergency Department (HOSPITAL_COMMUNITY): Payer: PRIVATE HEALTH INSURANCE

## 2015-11-18 ENCOUNTER — Inpatient Hospital Stay: Payer: Self-pay | Admitting: Family Medicine

## 2015-11-18 ENCOUNTER — Encounter (HOSPITAL_COMMUNITY): Payer: Self-pay | Admitting: *Deleted

## 2015-11-18 ENCOUNTER — Emergency Department (HOSPITAL_COMMUNITY)
Admission: EM | Admit: 2015-11-18 | Discharge: 2015-11-18 | Disposition: A | Discharge status: Home / Self Care | Payer: PRIVATE HEALTH INSURANCE | Attending: Emergency Medicine | Admitting: Emergency Medicine

## 2015-11-18 DIAGNOSIS — E119 Type 2 diabetes mellitus without complications: Secondary | ICD-10-CM | POA: Diagnosis not present

## 2015-11-18 DIAGNOSIS — R51 Headache: Secondary | ICD-10-CM | POA: Insufficient documentation

## 2015-11-18 DIAGNOSIS — I10 Essential (primary) hypertension: Secondary | ICD-10-CM | POA: Diagnosis not present

## 2015-11-18 DIAGNOSIS — R002 Palpitations: Secondary | ICD-10-CM | POA: Diagnosis not present

## 2015-11-18 DIAGNOSIS — J45909 Unspecified asthma, uncomplicated: Secondary | ICD-10-CM | POA: Insufficient documentation

## 2015-11-18 DIAGNOSIS — I509 Heart failure, unspecified: Secondary | ICD-10-CM | POA: Insufficient documentation

## 2015-11-18 DIAGNOSIS — F17200 Nicotine dependence, unspecified, uncomplicated: Secondary | ICD-10-CM | POA: Insufficient documentation

## 2015-11-18 LAB — I-STAT TROPONIN, ED: TROPONIN I, POC: 0 ng/mL (ref 0.00–0.08)

## 2015-11-18 LAB — BASIC METABOLIC PANEL
Anion gap: 14 (ref 5–15)
BUN: 25 mg/dL — AB (ref 6–20)
CALCIUM: 9.6 mg/dL (ref 8.9–10.3)
CO2: 25 mmol/L (ref 22–32)
Chloride: 98 mmol/L — ABNORMAL LOW (ref 101–111)
Creatinine, Ser: 1.2 mg/dL — ABNORMAL HIGH (ref 0.44–1.00)
GFR calc Af Amer: 57 mL/min — ABNORMAL LOW (ref >60)
GFR, EST NON AFRICAN AMERICAN: 49 mL/min — AB (ref >60)
GLUCOSE: 129 mg/dL — AB (ref 65–99)
Potassium: 3.7 mmol/L (ref 3.5–5.1)
Sodium: 137 mmol/L (ref 135–145)

## 2015-11-18 LAB — CBC
HEMATOCRIT: 44.3 % (ref 36.0–46.0)
Hemoglobin: 14.5 g/dL (ref 12.0–15.0)
MCH: 27.5 pg (ref 26.0–34.0)
MCHC: 32.7 g/dL (ref 30.0–36.0)
MCV: 84.1 fL (ref 78.0–100.0)
Platelets: 363 10*3/uL (ref 150–400)
RBC: 5.27 MIL/uL — ABNORMAL HIGH (ref 3.87–5.11)
RDW: 12.7 % (ref 11.5–15.5)
WBC: 11.8 10*3/uL — ABNORMAL HIGH (ref 4.0–10.5)

## 2015-11-18 NOTE — ED Notes (Signed)
Pt arrives via EMS. Pt c/o headache and palpitations. EMS reports that pt has chest congestion and some wheezing but refused albuterol treatment. States she was recently admitted to hospital for COPD. EMS reports pt in NSR 88 with some PVC's.

## 2015-11-18 NOTE — ED Notes (Signed)
Unable to locate patient when called for room x3

## 2015-11-25 LAB — CUP PACEART REMOTE DEVICE CHECK: Date Time Interrogation Session: 20170110194208

## 2015-11-25 NOTE — Progress Notes (Signed)
[facility] summary report received. Battery status OK. Normal device function. No new symptom episodes, tachy episodes, brady, or pause episodes. No new AF episodes. Monthly summary reports and ROV/PRN 

## 2015-12-02 NOTE — Progress Notes (Signed)
[facility] summary report received. Battery status OK. Normal device function. No new symptom episodes, tachy episodes, brady, or pause episodes. No new AF episodes. Monthly summary reports and ROV/PRN 

## 2015-12-07 ENCOUNTER — Ambulatory Visit (INDEPENDENT_AMBULATORY_CARE_PROVIDER_SITE_OTHER): Payer: PRIVATE HEALTH INSURANCE | Admitting: *Deleted

## 2015-12-07 DIAGNOSIS — I63011 Cerebral infarction due to thrombosis of right vertebral artery: Secondary | ICD-10-CM

## 2015-12-08 NOTE — Progress Notes (Signed)
[facility] Summary Report / Loop Recorder 

## 2015-12-17 ENCOUNTER — Ambulatory Visit: Payer: PRIVATE HEALTH INSURANCE | Admitting: Family

## 2015-12-21 ENCOUNTER — Emergency Department (HOSPITAL_COMMUNITY)
Admission: EM | Admit: 2015-12-21 | Discharge: 2015-12-21 | Disposition: A | Discharge status: Home / Self Care | Payer: PRIVATE HEALTH INSURANCE | Attending: Emergency Medicine | Admitting: Emergency Medicine

## 2015-12-21 ENCOUNTER — Emergency Department (HOSPITAL_COMMUNITY): Payer: PRIVATE HEALTH INSURANCE

## 2015-12-21 ENCOUNTER — Encounter (HOSPITAL_COMMUNITY): Payer: Self-pay | Admitting: Emergency Medicine

## 2015-12-21 DIAGNOSIS — Z794 Long term (current) use of insulin: Secondary | ICD-10-CM | POA: Insufficient documentation

## 2015-12-21 DIAGNOSIS — F17200 Nicotine dependence, unspecified, uncomplicated: Secondary | ICD-10-CM | POA: Insufficient documentation

## 2015-12-21 DIAGNOSIS — J45909 Unspecified asthma, uncomplicated: Secondary | ICD-10-CM | POA: Diagnosis not present

## 2015-12-21 DIAGNOSIS — E119 Type 2 diabetes mellitus without complications: Secondary | ICD-10-CM | POA: Insufficient documentation

## 2015-12-21 DIAGNOSIS — Z853 Personal history of malignant neoplasm of breast: Secondary | ICD-10-CM | POA: Diagnosis not present

## 2015-12-21 DIAGNOSIS — I10 Essential (primary) hypertension: Secondary | ICD-10-CM | POA: Diagnosis not present

## 2015-12-21 DIAGNOSIS — R002 Palpitations: Secondary | ICD-10-CM | POA: Insufficient documentation

## 2015-12-21 DIAGNOSIS — Z79899 Other long term (current) drug therapy: Secondary | ICD-10-CM | POA: Diagnosis not present

## 2015-12-21 DIAGNOSIS — I509 Heart failure, unspecified: Secondary | ICD-10-CM | POA: Diagnosis not present

## 2015-12-21 DIAGNOSIS — Z792 Long term (current) use of antibiotics: Secondary | ICD-10-CM | POA: Insufficient documentation

## 2015-12-21 DIAGNOSIS — R600 Localized edema: Secondary | ICD-10-CM | POA: Diagnosis not present

## 2015-12-21 DIAGNOSIS — R609 Edema, unspecified: Secondary | ICD-10-CM

## 2015-12-21 DIAGNOSIS — Z760 Encounter for issue of repeat prescription: Secondary | ICD-10-CM | POA: Insufficient documentation

## 2015-12-21 DIAGNOSIS — Z7984 Long term (current) use of oral hypoglycemic drugs: Secondary | ICD-10-CM | POA: Insufficient documentation

## 2015-12-21 DIAGNOSIS — E785 Hyperlipidemia, unspecified: Secondary | ICD-10-CM | POA: Insufficient documentation

## 2015-12-21 LAB — CBC
HCT: 39.6 % (ref 36.0–46.0)
HEMOGLOBIN: 12.7 g/dL (ref 12.0–15.0)
MCH: 27.5 pg (ref 26.0–34.0)
MCHC: 32.1 g/dL (ref 30.0–36.0)
MCV: 85.9 fL (ref 78.0–100.0)
PLATELETS: 384 10*3/uL (ref 150–400)
RBC: 4.61 MIL/uL (ref 3.87–5.11)
RDW: 13.4 % (ref 11.5–15.5)
WBC: 14.4 10*3/uL — ABNORMAL HIGH (ref 4.0–10.5)

## 2015-12-21 LAB — COMPREHENSIVE METABOLIC PANEL
ALK PHOS: 93 U/L (ref 38–126)
ALT: 16 U/L (ref 14–54)
ANION GAP: 12 (ref 5–15)
AST: 16 U/L (ref 15–41)
Albumin: 4 g/dL (ref 3.5–5.0)
BUN: 14 mg/dL (ref 6–20)
CO2: 26 mmol/L (ref 22–32)
CREATININE: 1.09 mg/dL — AB (ref 0.44–1.00)
Calcium: 9.5 mg/dL (ref 8.9–10.3)
Chloride: 104 mmol/L (ref 101–111)
GFR, EST NON AFRICAN AMERICAN: 55 mL/min — AB (ref >60)
Glucose, Bld: 64 mg/dL — ABNORMAL LOW (ref 65–99)
Potassium: 3.2 mmol/L — ABNORMAL LOW (ref 3.5–5.1)
Sodium: 142 mmol/L (ref 135–145)
Total Bilirubin: 0.1 mg/dL — ABNORMAL LOW (ref 0.3–1.2)
Total Protein: 7.7 g/dL (ref 6.5–8.1)

## 2015-12-21 LAB — CBG MONITORING, ED
GLUCOSE-CAPILLARY: 58 mg/dL — AB (ref 65–99)
GLUCOSE-CAPILLARY: 173 mg/dL — AB (ref 65–99)

## 2015-12-21 LAB — I-STAT TROPONIN, ED: TROPONIN I, POC: 0 ng/mL (ref 0.00–0.08)

## 2015-12-21 LAB — BRAIN NATRIURETIC PEPTIDE: B NATRIURETIC PEPTIDE 5: 32.6 pg/mL (ref 0.0–100.0)

## 2015-12-21 MED ORDER — HYDRALAZINE HCL 50 MG PO TABS
50.0000 mg | ORAL_TABLET | Freq: Once | ORAL | Status: AC
  Administered 2015-12-21: 50 mg via ORAL
  Filled 2015-12-21: qty 1

## 2015-12-21 MED ORDER — CLOPIDOGREL BISULFATE 75 MG PO TABS: 75.0000 mg | ORAL_TABLET | Freq: Every day | ORAL | Status: DC

## 2015-12-21 MED ORDER — HYDRALAZINE HCL 50 MG PO TABS: 50.0000 mg | ORAL_TABLET | Freq: Three times a day (TID) | ORAL | Status: DC

## 2015-12-21 MED ORDER — TRIAMTERENE-HCTZ 37.5-25 MG PO TABS: 1.0000 | ORAL_TABLET | Freq: Every day | ORAL | Status: DC

## 2015-12-21 MED ORDER — ROSUVASTATIN CALCIUM 40 MG PO TABS: 40.0000 mg | ORAL_TABLET | Freq: Every evening | ORAL | Status: DC

## 2015-12-21 MED ORDER — FUROSEMIDE 10 MG/ML IJ SOLN
20.0000 mg | Freq: Once | INTRAMUSCULAR | Status: AC
  Administered 2015-12-21: 20 mg via INTRAVENOUS
  Filled 2015-12-21: qty 4

## 2015-12-21 MED ORDER — DILTIAZEM HCL 60 MG PO TABS: 60.0000 mg | ORAL_TABLET | Freq: Four times a day (QID) | ORAL | Status: DC

## 2015-12-21 MED ORDER — POTASSIUM CHLORIDE CRYS ER 20 MEQ PO TBCR
20.0000 meq | EXTENDED_RELEASE_TABLET | Freq: Once | ORAL | Status: AC
  Administered 2015-12-21: 20 meq via ORAL
  Filled 2015-12-21: qty 1

## 2015-12-21 MED ORDER — TRIAMTERENE-HCTZ 37.5-25 MG PO TABS
1.0000 | ORAL_TABLET | Freq: Once | ORAL | Status: AC
  Administered 2015-12-21: 1 via ORAL
  Filled 2015-12-21: qty 1

## 2015-12-21 NOTE — Discharge Instructions (Signed)
Heart Failure °Heart failure is a condition in which the heart has trouble pumping blood. This means your heart does not pump blood efficiently for your body to work well. In some cases of heart failure, fluid may back up into your lungs or you may have swelling (edema) in your lower legs. Heart failure is usually a long-term (chronic) condition. It is important for you to take good care of yourself and follow your health care provider's treatment plan. °CAUSES  °Some health conditions can cause heart failure. Those health conditions include: °· High blood pressure (hypertension). Hypertension causes the heart muscle to work harder than normal. When pressure in the blood vessels is high, the heart needs to pump (contract) with more force in order to circulate blood throughout the body. High blood pressure eventually causes the heart to become stiff and weak. °· Coronary artery disease (CAD). CAD is the buildup of cholesterol and fat (plaque) in the arteries of the heart. The blockage in the arteries deprives the heart muscle of oxygen and blood. This can cause chest pain and may lead to a heart attack. High blood pressure can also contribute to CAD. °· Heart attack (myocardial infarction). A heart attack occurs when one or more arteries in the heart become blocked. The loss of oxygen damages the muscle tissue of the heart. When this happens, part of the heart muscle dies. The injured tissue does not contract as well and weakens the heart's ability to pump blood. °· Abnormal heart valves. When the heart valves do not open and close properly, it can cause heart failure. This makes the heart muscle pump harder to keep the blood flowing. °· Heart muscle disease (cardiomyopathy or myocarditis). Heart muscle disease is damage to the heart muscle from a variety of causes. These can include drug or alcohol abuse, infections, or unknown reasons. These can increase the risk of heart failure. °· Lung disease. Lung disease  makes the heart work harder because the lungs do not work properly. This can cause a strain on the heart, leading it to fail. °· Diabetes. Diabetes increases the risk of heart failure. High blood sugar contributes to high fat (lipid) levels in the blood. Diabetes can also cause slow damage to tiny blood vessels that carry important nutrients to the heart muscle. When the heart does not get enough oxygen and food, it can cause the heart to become weak and stiff. This leads to a heart that does not contract efficiently. °· Other conditions can contribute to heart failure. These include abnormal heart rhythms, thyroid problems, and low blood counts (anemia). °Certain unhealthy behaviors can increase the risk of heart failure, including: °· Being overweight. °· Smoking or chewing tobacco. °· Eating foods high in fat and cholesterol. °· Abusing illicit drugs or alcohol. °· Lacking physical activity. °SYMPTOMS  °Heart failure symptoms may vary and can be hard to detect. Symptoms may include: °· Shortness of breath with activity, such as climbing stairs. °· Persistent cough. °· Swelling of the feet, ankles, legs, or abdomen. °· Unexplained weight gain. °· Difficulty breathing when lying flat (orthopnea). °· Waking from sleep because of the need to sit up and get more air. °· Rapid heartbeat. °· Fatigue and loss of energy. °· Feeling light-headed, dizzy, or close to fainting. °· Loss of appetite. °· Nausea. °· Increased urination during the night (nocturia). °DIAGNOSIS  °A diagnosis of heart failure is based on your history, symptoms, physical examination, and diagnostic tests. Diagnostic tests for heart failure may include: °·   Echocardiography. °· Electrocardiography. °· Chest X-ray. °· Blood tests. °· Exercise stress test. °· Cardiac angiography. °· Radionuclide scans. °TREATMENT  °Treatment is aimed at managing the symptoms of heart failure. Medicines, behavioral changes, or surgical intervention may be necessary to  treat heart failure. °· Medicines to help treat heart failure may include: °¨ Angiotensin-converting enzyme (ACE) inhibitors. This type of medicine blocks the effects of a blood protein called angiotensin-converting enzyme. ACE inhibitors relax (dilate) the blood vessels and help lower blood pressure. °¨ Angiotensin receptor blockers (ARBs). This type of medicine blocks the actions of a blood protein called angiotensin. Angiotensin receptor blockers dilate the blood vessels and help lower blood pressure. °¨ Water pills (diuretics). Diuretics cause the kidneys to remove salt and water from the blood. The extra fluid is removed through urination. This loss of extra fluid lowers the volume of blood the heart pumps. °¨ Beta blockers. These prevent the heart from beating too fast and improve heart muscle strength. °¨ Digitalis. This increases the force of the heartbeat. °· Healthy behavior changes include: °¨ Obtaining and maintaining a healthy weight. °¨ Stopping smoking or chewing tobacco. °¨ Eating heart-healthy foods. °¨ Limiting or avoiding alcohol. °¨ Stopping illicit drug use. °¨ Physical activity as directed by your health care provider. °· Surgical treatment for heart failure may include: °¨ A procedure to open blocked arteries, repair damaged heart valves, or remove damaged heart muscle tissue. °¨ A pacemaker to improve heart muscle function and control certain abnormal heart rhythms. °¨ An internal cardioverter defibrillator to treat certain serious abnormal heart rhythms. °¨ A left ventricular assist device (LVAD) to assist the pumping ability of the heart. °HOME CARE INSTRUCTIONS  °· Take medicines only as directed by your health care provider. Medicines are important in reducing the workload of your heart, slowing the progression of heart failure, and improving your symptoms. °¨ Do not stop taking your medicine unless directed by your health care provider. °¨ Do not skip any dose of medicine. °¨ Refill your  prescriptions before you run out of medicine. Your medicines are needed every day. °· Engage in moderate physical activity if directed by your health care provider. Moderate physical activity can benefit some people. The elderly and people with severe heart failure should consult with a health care provider for physical activity recommendations. °· Eat heart-healthy foods. Food choices should be free of trans fat and low in saturated fat, cholesterol, and salt (sodium). Healthy choices include fresh or frozen fruits and vegetables, fish, lean meats, legumes, fat-free or low-fat dairy products, and whole grain or high fiber foods. Talk to a dietitian to learn more about heart-healthy foods. °· Limit sodium if directed by your health care provider. Sodium restriction may reduce symptoms of heart failure in some people. Talk to a dietitian to learn more about heart-healthy seasonings. °· Use healthy cooking methods. Healthy cooking methods include roasting, grilling, broiling, baking, poaching, steaming, or stir-frying. Talk to a dietitian to learn more about healthy cooking methods. °· Limit fluids if directed by your health care provider. Fluid restriction may reduce symptoms of heart failure in some people. °· Weigh yourself every day. Daily weights are important in the early recognition of excess fluid. You should weigh yourself every morning after you urinate and before you eat breakfast. Wear the same amount of clothing each time you weigh yourself. Record your daily weight. Provide your health care provider with your weight record. °· Monitor and record your blood pressure if directed by your health care   provider.  Check your pulse if directed by your health care provider.  Lose weight if directed by your health care provider. Weight loss may reduce symptoms of heart failure in some people.  Stop smoking or chewing tobacco. Nicotine makes your heart work harder by causing your blood vessels to constrict.  Do not use nicotine gum or patches before talking to your health care provider.  Keep all follow-up visits as directed by your health care provider. This is important.  Limit alcohol intake to no more than 1 drink per day for nonpregnant women and 2 drinks per day for men. One drink equals 12 ounces of beer, 5 ounces of wine, or 1 ounces of hard liquor. Drinking more than that is harmful to your heart. Tell your health care provider if you drink alcohol several times a week. Talk with your health care provider about whether alcohol is safe for you. If your heart has already been damaged by alcohol or you have severe heart failure, drinking alcohol should be stopped completely.  Stop illicit drug use.  Stay up-to-date with immunizations. It is especially important to prevent respiratory infections through current pneumococcal and influenza immunizations.  Manage other health conditions such as hypertension, diabetes, thyroid disease, or abnormal heart rhythms as directed by your health care provider.  Learn to manage stress.  Plan rest periods when fatigued.  Learn strategies to manage high temperatures. If the weather is extremely hot:  Avoid vigorous physical activity.  Use air conditioning or fans or seek a cooler location.  Avoid caffeine and alcohol.  Wear loose-fitting, lightweight, and light-colored clothing.  Learn strategies to manage cold temperatures. If the weather is extremely cold:  Avoid vigorous physical activity.  Layer clothes.  Wear mittens or gloves, a hat, and a scarf when going outside.  Avoid alcohol.  Obtain ongoing education and support as needed.  Participate in or seek rehabilitation as needed to maintain or improve independence and quality of life. SEEK MEDICAL CARE IF:   You have a rapid weight gain.  You have increasing shortness of breath that is unusual for you.  You are unable to participate in your usual physical activities.  You tire  easily.  You cough more than normal, especially with physical activity.  You have any or more swelling in areas such as your hands, feet, ankles, or abdomen.  You are unable to sleep because it is hard to breathe.  You feel like your heart is beating fast (palpitations).  You become dizzy or light-headed upon standing up. SEEK IMMEDIATE MEDICAL CARE IF:   You have difficulty breathing.  There is a change in mental status such as decreased alertness or difficulty with concentration.  You have a pain or discomfort in your chest.  You have an episode of fainting (syncope). MAKE SURE YOU:   Understand these instructions.  Will watch your condition.  Will get help right away if you are not doing well or get worse.   This information is not intended to replace advice given to you by your health care provider. Make sure you discuss any questions you have with your health care provider.   Document Released: 09/12/2005 Document Revised: 01/27/2015 Document Reviewed: 10/12/2012 Elsevier Interactive Patient Education 2016 [employer]. Palpitations A palpitation is the feeling that your heartbeat is irregular or is faster than normal. It may feel like your heart is fluttering or skipping a beat. Palpitations are usually not a serious problem. However, in some cases, you may need further  medical evaluation. CAUSES  Palpitations can be caused by:  Smoking.  Caffeine or other stimulants, such as diet pills or energy drinks.  Alcohol.  Stress and anxiety.  Strenuous physical activity.  Fatigue.  Certain medicines.  Heart disease, especially if you have a history of irregular heart rhythms (arrhythmias), such as atrial fibrillation, atrial flutter, or supraventricular tachycardia.  An improperly working pacemaker or defibrillator. DIAGNOSIS  To find the cause of your palpitations, your health care provider will take your medical history and perform a physical exam. Your health  care provider may also have you take a test called an ambulatory electrocardiogram (ECG). An ECG records your heartbeat patterns over a 24-hour period. You may also have other tests, such as:  Transthoracic echocardiogram (TTE). During echocardiography, sound waves are used to evaluate how blood flows through your heart.  Transesophageal echocardiogram (TEE).  Cardiac monitoring. This allows your health care provider to monitor your heart rate and rhythm in real time.  Holter monitor. This is a portable device that records your heartbeat and can help diagnose heart arrhythmias. It allows your health care provider to track your heart activity for several days, if needed.  Stress tests by exercise or by giving medicine that makes the heart beat faster. TREATMENT  Treatment of palpitations depends on the cause of your symptoms and can vary greatly. Most cases of palpitations do not require any treatment other than time, relaxation, and monitoring your symptoms. Other causes, such as atrial fibrillation, atrial flutter, or supraventricular tachycardia, usually require further treatment. HOME CARE INSTRUCTIONS   Avoid:  Caffeinated coffee, tea, soft drinks, diet pills, and energy drinks.  Chocolate.  Alcohol.  Stop smoking if you smoke.  Reduce your stress and anxiety. Things that can help you relax include:  A method of controlling things in your body, such as your heartbeats, with your mind (biofeedback).  Yoga.  Meditation.  Physical activity such as swimming, jogging, or walking.  Get plenty of rest and sleep. SEEK MEDICAL CARE IF:   You continue to have a fast or irregular heartbeat beyond 24 hours.  Your palpitations occur more often. SEEK IMMEDIATE MEDICAL CARE IF:  You have chest pain or shortness of breath.  You have a severe headache.  You feel dizzy or you faint. MAKE SURE YOU:  Understand these instructions.  Will watch your condition.  Will get help right  away if you are not doing well or get worse.   This information is not intended to replace advice given to you by your health care provider. Make sure you discuss any questions you have with your health care provider.   Document Released: 09/09/2000 Document Revised: 09/17/2013 Document Reviewed: 11/11/2011 Elsevier Interactive Patient Education [employer].

## 2015-12-21 NOTE — ED Notes (Signed)
Patient transported to X-ray 

## 2015-12-21 NOTE — ED Provider Notes (Signed)
CSN: [id number]     Arrival date & time 12/21/15  1050 History  By signing my name below, I, Rayna Sexton, attest that this documentation has been prepared under the direction and in the presence of Delsa Grana, PA-C. Electronically Signed: Rayna Sexton, ED Scribe. 12/21/2015. 2:26 PM.   Chief Complaint  Patient presents with  . Medication Refill   The history is provided by the patient. No language interpreter was used.    HPI Comments: Lisa Anderson is a 60 y.o. female with a PMHx of HTN, DM and CHF who presents to the Emergency Department requesting a rx refill for her Plavix, triamterene and hydrochlorothiazide noting that she ran out of her Plavix today and has been out of her triamterene and hydrochlorothiazide for 5 days (BP 172/102 mmHg in triage). She reports associated, mild, facial and bilateral hand swelling, dry cough and heart palpitations. Pt took an OTC diuretic yesterday to try and alleviate sx, it make her have increased urination, however she did not have much sx improvement. Pt reports a PMHx of stroke in 07/2015 and was hospitalized last month for a COPD exacerbation and denies having been evaluated by a provider since her stay. She reports intermittent, bilateral, LE weakness and paraesthesia that began experiencing following her hospitalization last month, no acute change today.  Pt notes she takes 1000 mg metformin BID and glipizide on a sliding scale. She denies leg swelling, CP.  She endorses two days of cough with clear to white sputum, orthopnea and mildly increased exertional dyspnea.   She has not seen a PCP, but has multiple recent admissions.  She states that she was not told she had CHF, but she read about it on her paperwork when she was discharged.  She reports that multiple medications are new and she has not had any follow up or repeat lab work.  She is supposed to see [facility], but appointment is not until later next month.  She  reports running out of plavix, cardizem, hydralazine, rosuvastatin, triamterene-hydrochlorothiazide.  She took her diabetes medication this morning, but has not eaten and feels like her blood sugar is getting low.   Past Medical History  Diagnosis Date  . Hypertension   . Diabetes mellitus   . Hyperlipidemia   . Asthma   . Cancer [facility])     breast  . CHF (congestive heart failure) [facility])    Past Surgical History  Procedure Laterality Date  . Tonsillectomy    . Breast lumpectomy    . Ep implantable device N/A 08/07/2015    Procedure: Loop Recorder Insertion;  Surgeon: Thompson Grayer, MD;  Location: [place] [facility];  Service: Cardiovascular;  Laterality: N/A;  . Tee without cardioversion N/A 08/07/2015    Procedure: TRANSESOPHAGEAL ECHOCARDIOGRAM (TEE);  Surgeon: Sueanne Margarita, MD;  Location: [facility] ENDOSCOPY;  Service: Cardiovascular;  Laterality: N/A;   Family History  Problem Relation Age of Onset  . Cancer Mother   . Diabetes Mother   . Hyperlipidemia Mother   . Hypertension Mother   . Rheum arthritis Father   . Asthma Father   . Stroke Father   . Thyroid disease Father   . Thyroid disease Brother    Social History  Substance Use Topics  . Smoking status: Current Some Day Smoker  . Smokeless tobacco: None  . Alcohol Use: 3.6 oz/week    6 Cans of beer per week   OB History    No data available  Review of Systems  All other systems reviewed and are negative.  A complete 10 system review of systems was obtained and all systems are negative except as noted in the HPI and PMH.   Allergies  Prednisone; Benicar; Celexa; Effexor; and Shrimp  Home Medications   Prior to Admission medications   Medication Sig Start Date End Date Taking? Authorizing Provider  albuterol (PROVENTIL HFA;VENTOLIN HFA) 108 (90 Base) MCG/ACT inhaler Inhale 2 puffs into the lungs every 6 (six) hours as needed for wheezing or shortness of breath. Shortness of breath 11/16/15   Velvet Bathe, MD   benzonatate (TESSALON) 100 MG capsule Take 1 capsule (100 mg total) by mouth 3 (three) times daily as needed for cough. 11/16/15   Velvet Bathe, MD  clopidogrel (PLAVIX) 75 MG tablet Take 1 tablet (75 mg total) by mouth daily. 12/21/15   Delsa Grana, PA-C  diltiazem (CARDIZEM) 60 MG tablet Take 1 tablet (60 mg total) by mouth 4 (four) times daily. 12/21/15   Delsa Grana, PA-C  doxycycline (VIBRA-TABS) 100 MG tablet Take 1 tablet (100 mg total) by mouth every 12 (twelve) hours. 11/16/15   Velvet Bathe, MD  DULoxetine (CYMBALTA) 30 MG capsule Take 1 capsule (30 mg total) by mouth daily. 09/02/15   Arnoldo Morale, MD  FLUoxetine (PROZAC) 20 MG capsule Take 20 mg by mouth daily.    Historical Provider, MD  folic acid (FOLVITE) 1 MG tablet Take 1 tablet (1 mg total) by mouth daily. 11/02/15   Geradine Girt, DO  glipiZIDE (GLUCOTROL) 5 MG tablet Take 1 tablet (5 mg total) by mouth 2 (two) times daily before a meal. 08/07/15   Ripudeep K Rai, MD  hydrALAZINE (APRESOLINE) 50 MG tablet Take 1 tablet (50 mg total) by mouth 3 (three) times daily. 12/21/15   Delsa Grana, PA-C  insulin aspart (NOVOLOG) 100 UNIT/ML injection Sliding scale CBG 70 - 120: 0 units CBG 121 - 150: 1 unit,  CBG 151 - 200: 2 units,  CBG 201 - 250: 3 units,  CBG 251 - 300: 5 units,  CBG 301 - 350: 7 units,  CBG 351 - 400: 9 units   CBG > 400: 9 units and notify your MD 08/07/15   Ripudeep Krystal Eaton, MD  metFORMIN (GLUCOPHAGE) 1000 MG tablet Take 1 tablet (1,000 mg total) by mouth 2 (two) times daily with a meal. 08/07/15   Ripudeep Krystal Eaton, MD  Multiple Vitamin (MULTIVITAMIN WITH MINERALS) TABS tablet Take 1 tablet by mouth daily. 11/02/15   Geradine Girt, DO  rosuvastatin (CRESTOR) 40 MG tablet Take 1 tablet (40 mg total) by mouth every evening. 12/21/15   Delsa Grana, PA-C  thiamine 100 MG tablet Take 1 tablet (100 mg total) by mouth daily. 11/02/15   Geradine Girt, DO  triamterene-hydrochlorothiazide (MAXZIDE-25) 37.5-25 MG tablet Take 1 tablet by mouth  daily. 12/21/15   Delsa Grana, PA-C   BP 160/76 mmHg  Pulse 83  Temp(Src) 97.9 F (36.6 C) (Oral)  Resp 18  SpO2 97%    Physical Exam  Constitutional: She is oriented to person, place, and time. She appears well-developed and well-nourished. No distress.  HENT:  Head: Normocephalic and atraumatic.  Nose: Nose normal.  Mouth/Throat: Oropharynx is clear and moist. No oropharyngeal exudate.  Eyes: Conjunctivae and EOM are normal. Pupils are equal, round, and reactive to light. Right eye exhibits no discharge. Left eye exhibits no discharge. No scleral icterus.  Neck: Normal range of motion. Neck supple. JVD present.  No tracheal deviation present. No thyromegaly present.  Cardiovascular: Normal rate, regular rhythm and intact distal pulses.  Exam reveals gallop. Exam reveals no friction rub.   No murmur heard. Pulmonary/Chest: Effort normal and breath sounds normal. No stridor. No respiratory distress. She has no wheezes. She has no rales. She exhibits no tenderness.  Abdominal: Soft. Bowel sounds are normal. She exhibits no distension and no mass. There is no tenderness. There is no rebound and no guarding.  Musculoskeletal: Normal range of motion. She exhibits no edema or tenderness.  Lymphadenopathy:    She has no cervical adenopathy.  Neurological: She is alert and oriented to person, place, and time. She has normal reflexes. No cranial nerve deficit. She exhibits normal muscle tone. Coordination normal.  Skin: Skin is warm. No rash noted. She is diaphoretic. No erythema. No pallor.  Psychiatric: She has a normal mood and affect. Her behavior is normal. Judgment and thought content normal.  Nursing note and vitals reviewed.   ED Course  Procedures  DIAGNOSTIC STUDIES: Oxygen Saturation is 99% on RA, normal by my interpretation.    COORDINATION OF CARE: 2:24 PM Discussed next steps with pt. She verbalized understanding and is agreeable with the plan.   Labs Review Labs Reviewed   CBC - Abnormal; Notable for the following:    WBC 14.4 (*)    All other components within normal limits  COMPREHENSIVE METABOLIC PANEL - Abnormal; Notable for the following:    Potassium 3.2 (*)    Glucose, Bld 64 (*)    Creatinine, Ser 1.09 (*)    Total Bilirubin 0.1 (*)    GFR calc non Af Amer 55 (*)    All other components within normal limits  CBG MONITORING, ED - Abnormal; Notable for the following:    Glucose-Capillary 58 (*)    All other components within normal limits  BRAIN NATRIURETIC PEPTIDE  I-STAT TROPOININ, ED    Imaging Review Dg Chest 2 View  12/21/2015  CLINICAL DATA:  Cough for 2 days EXAM: CHEST  2 VIEW COMPARISON:  11/23/2015 FINDINGS: The heart size and mediastinal contours are within normal limits. Both lungs are clear. The visualized skeletal structures are unremarkable. IMPRESSION: No active cardiopulmonary disease. Electronically Signed   By: Kathreen Devoid   On: 12/21/2015 15:04     EKG Interpretation   Date/Time:  Monday December 21 2015 15:33:25 EDT Ventricular Rate:  84 PR Interval:  156 QRS Duration: 104 QT Interval:  414 QTC Calculation: 489 R Axis:   -24 Text Interpretation:  Sinus rhythm Borderline left axis deviation Abnormal  R-wave progression, early transition Borderline prolonged QT interval ED  PHYSICIAN INTERPRETATION AVAILABLE IN [facility] Confirmed by TEST,  Record ([id number]) on 12/22/2015 6:56:24 AM      MDM   Pt presents with request for multiple medication refills, she has complex medication hx, and multiple recent admissions, w/o any PCP follow up.  CC includes swelling and recently ran out of several HTN medications with hx of CHF, CVA, DM, asthma.  Felt that I could not refill meds without checking labs and ensuring pt was stable to wait for her PCP follow up in approximately one month from now.   Pt expressed that her blood sugar felt low, and it was 58.  She was able to eat and drink and it improved to 173.  Pt's labs were  significant for leukocytosis of 14.4, likely secondary to recent steroid use for asthma exacerbation/COPD.  She complained of cough, CXR  negative.  She was mildly hypokalemic, was replaced in the ER.  BNP was negative.  Kidney function was improved from her recent labs.  She had stable vitals baring some initial elevation of BP, she did not have any CP, HA or SOB.  She was given diuretic while in the ER and with discharged with refills of her meds.  She was discharged in good condition.  Final diagnoses:  Heart palpitations  Peripheral edema  Medication refill    I personally performed the services described in this documentation, which was scribed in my presence. The recorded information has been reviewed and is accurate.      Delsa Grana, PA-C 12/28/15 [id number]  Gareth Morgan, MD 12/28/15 [phone]

## 2015-12-21 NOTE — ED Notes (Signed)
Pt states she is out of triamterene/hydrochlorothiazide medication x 5 days, which she takes for CHF. Has noted facial and hand edema. Pt bought OTC medication called "diuretic," no further specification, pt does not have medication with her. States it "seemed to work because I peed all night." Pt also out of clopidogrel and rosuvastatin.

## 2015-12-21 NOTE — ED Notes (Signed)
Pt given orange juice and crackers

## 2016-01-04 ENCOUNTER — Ambulatory Visit (INDEPENDENT_AMBULATORY_CARE_PROVIDER_SITE_OTHER): Payer: PRIVATE HEALTH INSURANCE | Admitting: *Deleted

## 2016-01-04 DIAGNOSIS — I63011 Cerebral infarction due to thrombosis of right vertebral artery: Secondary | ICD-10-CM | POA: Diagnosis not present

## 2016-01-05 NOTE — Progress Notes (Signed)
[facility] Summary Report / Loop Recorder 

## 2016-01-08 LAB — CUP PACEART REMOTE DEVICE CHECK: MDC IDC SESS DTM: 20170209200610

## 2016-01-21 ENCOUNTER — Ambulatory Visit: Payer: PRIVATE HEALTH INSURANCE | Admitting: Family

## 2016-01-21 DIAGNOSIS — Z0289 Encounter for other administrative examinations: Secondary | ICD-10-CM

## 2016-02-03 ENCOUNTER — Ambulatory Visit (INDEPENDENT_AMBULATORY_CARE_PROVIDER_SITE_OTHER): Payer: PRIVATE HEALTH INSURANCE | Admitting: *Deleted

## 2016-02-03 DIAGNOSIS — I63011 Cerebral infarction due to thrombosis of right vertebral artery: Secondary | ICD-10-CM | POA: Diagnosis not present

## 2016-02-04 NOTE — Progress Notes (Signed)
[facility] Summary Report / Loop Recorder 

## 2016-02-12 ENCOUNTER — Encounter: Payer: Self-pay | Admitting: Cardiology

## 2016-02-12 ENCOUNTER — Telehealth: Payer: Self-pay | Admitting: Cardiology

## 2016-02-12 NOTE — Telephone Encounter (Signed)
Spoke w/ pt daughter and she is going to have pt send a manual transmission w/ her home monitor.

## 2016-02-17 LAB — CUP PACEART REMOTE DEVICE CHECK: Date Time Interrogation Session: 20170311204021

## 2016-02-21 LAB — CUP PACEART REMOTE DEVICE CHECK: MDC IDC SESS DTM: 20170410210911

## 2016-02-21 NOTE — Progress Notes (Signed)
[facility] summary report received. Battery status OK. Normal device function. No new symptom episodes, tachy episodes, brady, or pause episodes. No new AF episodes. Monthly summary reports and ROV/PRN 

## 2016-02-22 ENCOUNTER — Encounter: Payer: Self-pay | Admitting: Internal Medicine

## 2016-02-26 ENCOUNTER — Telehealth: Payer: Self-pay | Admitting: *Deleted

## 2016-02-26 NOTE — Telephone Encounter (Signed)
Spoke with patient's daughter, Marcie Bal (called primary home number listed for patient).  Per Marcie Bal, patient was hospitalized somewhere in [facility] when this episode occurred.  She does not know any further details as the patient no longer lives with her.  Tachy episode on LINQ transmission from 02/19/16 is most likely EMI, possibly from MRI.  Placed in Dr. Otilio Connors folder for review.

## 2016-03-04 ENCOUNTER — Ambulatory Visit (INDEPENDENT_AMBULATORY_CARE_PROVIDER_SITE_OTHER): Payer: PRIVATE HEALTH INSURANCE | Admitting: *Deleted

## 2016-03-04 DIAGNOSIS — I63011 Cerebral infarction due to thrombosis of right vertebral artery: Secondary | ICD-10-CM

## 2016-03-07 NOTE — Progress Notes (Signed)
[facility] Summary Report / Loop Recorder 

## 2016-03-11 LAB — CUP PACEART REMOTE DEVICE CHECK
Date Time Interrogation Session: 20170609214011
MDC IDC SESS DTM: 20170510213616

## 2016-03-17 ENCOUNTER — Telehealth: Payer: Self-pay | Admitting: *Deleted

## 2016-03-17 ENCOUNTER — Ambulatory Visit: Payer: PRIVATE HEALTH INSURANCE | Admitting: Neurology

## 2016-03-17 NOTE — Telephone Encounter (Signed)
no showed f/u 

## 2016-03-18 ENCOUNTER — Telehealth: Payer: Self-pay | Admitting: Cardiology

## 2016-03-18 ENCOUNTER — Encounter: Payer: Self-pay | Admitting: Neurology

## 2016-03-18 NOTE — Telephone Encounter (Signed)
LMOVM requesting that pt send manual transmission b/c home monitor has not updated in at least 14 days.    

## 2016-03-25 ENCOUNTER — Encounter: Payer: Self-pay | Admitting: Cardiology

## 2016-04-04 ENCOUNTER — Encounter: Payer: PRIVATE HEALTH INSURANCE | Admitting: *Deleted

## 2016-04-08 ENCOUNTER — Encounter: Payer: Self-pay | Admitting: Cardiology

## 2016-04-27 ENCOUNTER — Telehealth: Payer: Self-pay | Admitting: Cardiology

## 2016-04-27 NOTE — Telephone Encounter (Signed)
LMOVM requesting that pt send manual transmission b/c home monitor has not updated in at least 14 days.    

## 2016-05-03 ENCOUNTER — Ambulatory Visit (INDEPENDENT_AMBULATORY_CARE_PROVIDER_SITE_OTHER): Payer: PRIVATE HEALTH INSURANCE | Admitting: *Deleted

## 2016-05-03 DIAGNOSIS — I63011 Cerebral infarction due to thrombosis of right vertebral artery: Secondary | ICD-10-CM

## 2016-05-04 ENCOUNTER — Encounter: Payer: Self-pay | Admitting: Cardiology

## 2016-05-04 NOTE — Progress Notes (Signed)
[facility] Summary Report / Loop Recorder 

## 2016-05-10 LAB — CUP PACEART REMOTE DEVICE CHECK: Date Time Interrogation Session: 20170808230512

## 2016-05-11 IMAGING — CR DG CHEST 2V
2 series · 2 of 2 positions shown · non-contrast
Comparison: 11/23/2015

CLINICAL DATA: Cough for 2 days

EXAM:
CHEST  2 VIEW

[w chest pa]
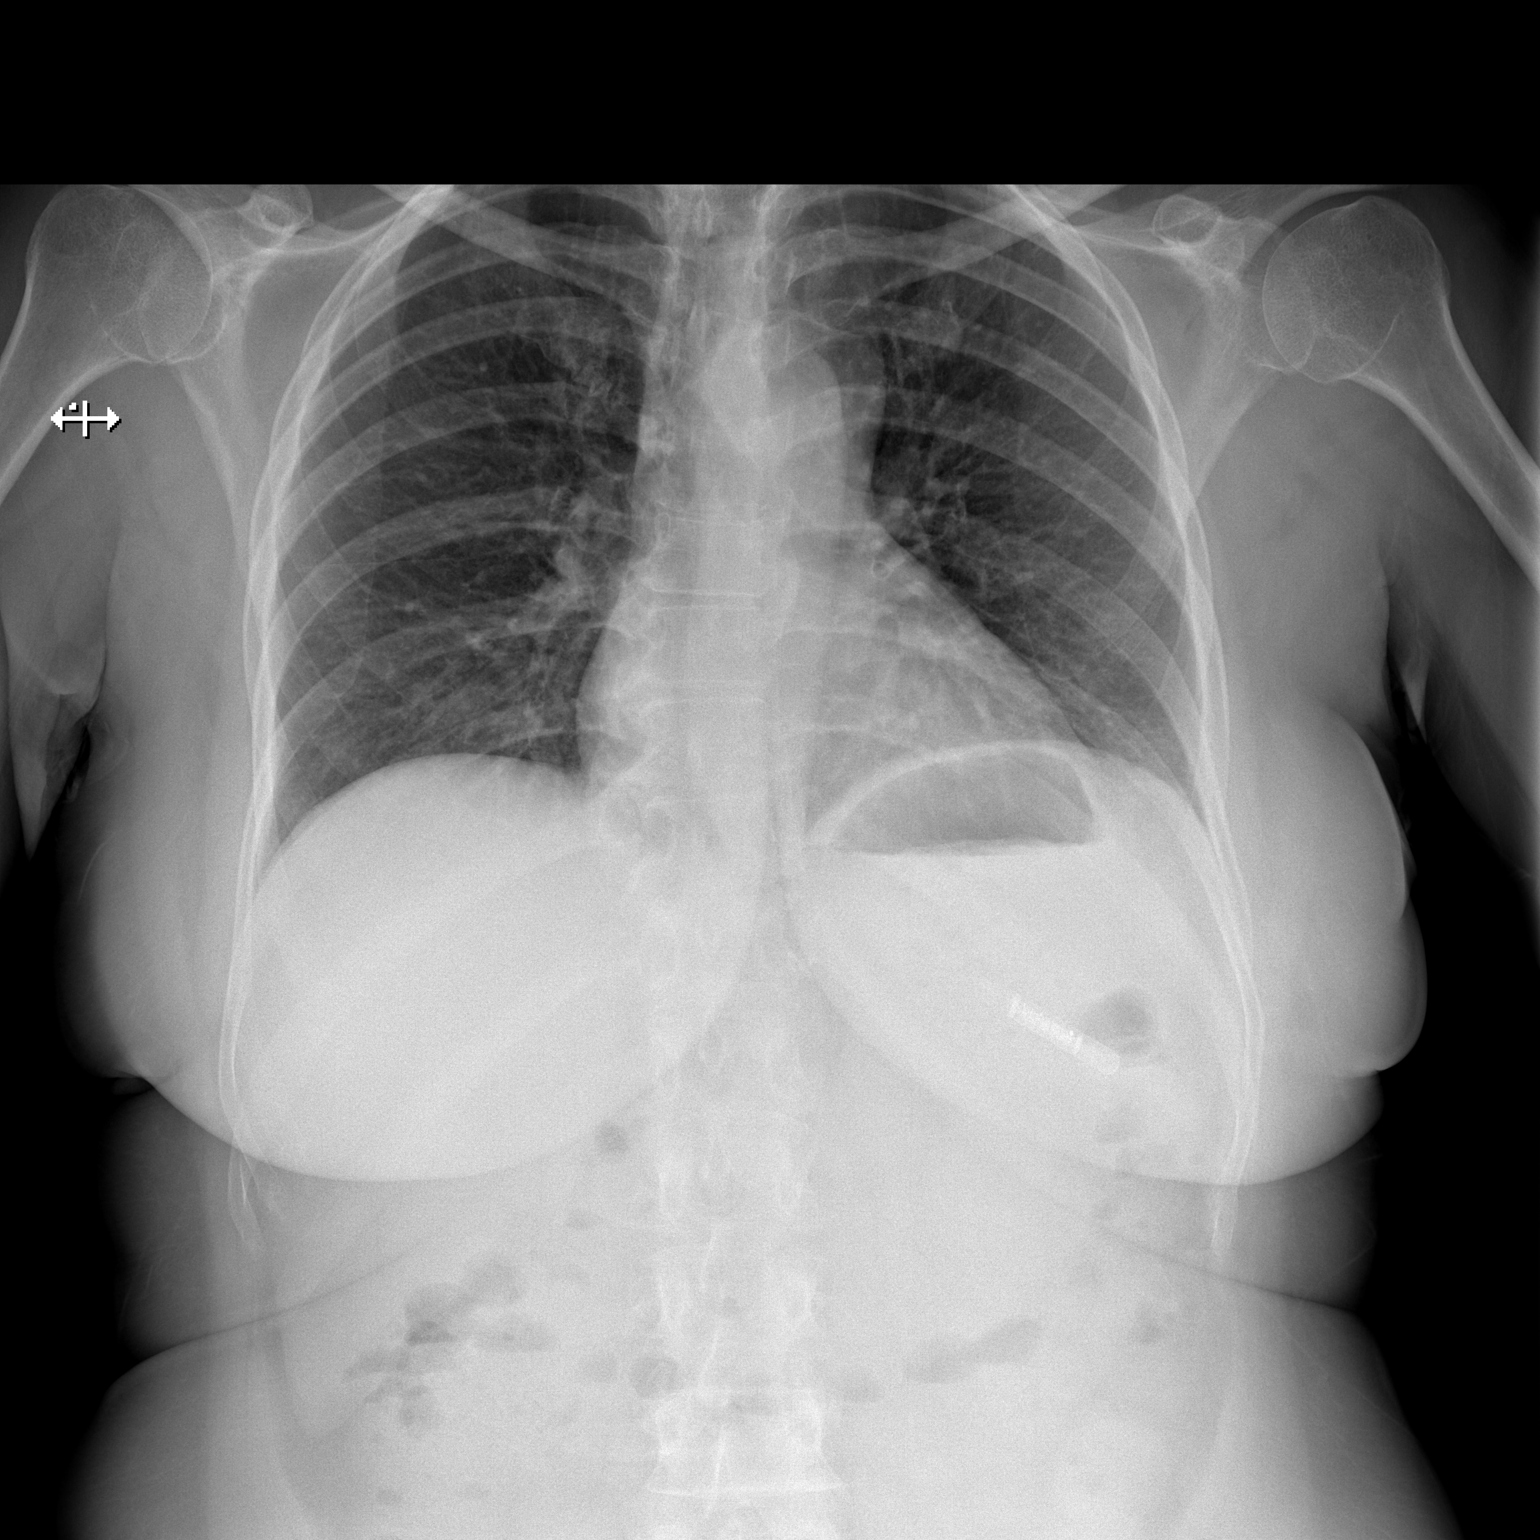

[w chest lat]
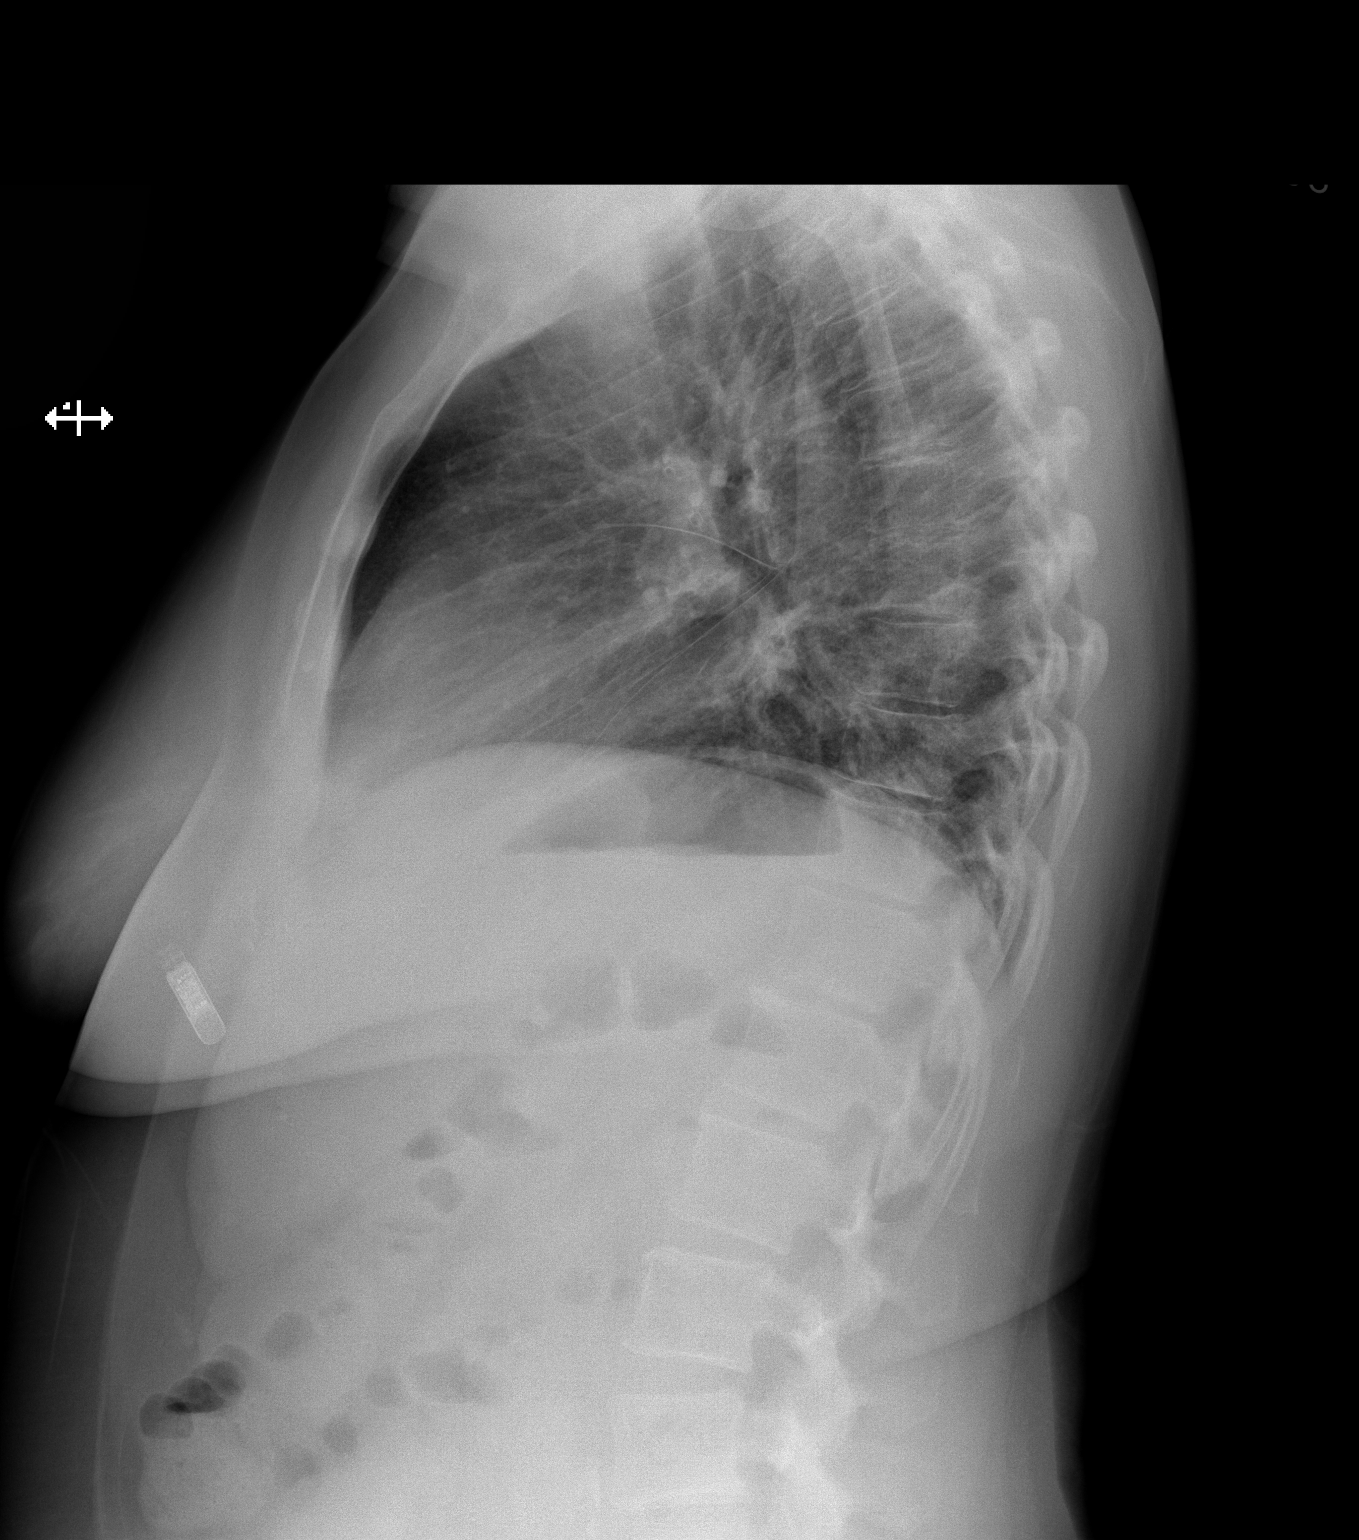

[2 of 2 positions shown; findings below may reference images not displayed]

FINDINGS: The heart size and mediastinal contours are within normal limits.
Both lungs are clear. The visualized skeletal structures are
unremarkable.
IMPRESSION: No active cardiopulmonary disease.

## 2016-06-02 ENCOUNTER — Encounter: Payer: Self-pay | Admitting: *Deleted

## 2016-09-21 ENCOUNTER — Emergency Department (HOSPITAL_COMMUNITY)
Admission: EM | Admit: 2016-09-21 | Discharge: 2016-09-21 | Disposition: A | Discharge status: Home / Self Care | Payer: Medicaid Other | Attending: Emergency Medicine | Admitting: Emergency Medicine

## 2016-09-21 ENCOUNTER — Encounter (HOSPITAL_COMMUNITY): Payer: Self-pay

## 2016-09-21 DIAGNOSIS — E876 Hypokalemia: Secondary | ICD-10-CM | POA: Diagnosis not present

## 2016-09-21 DIAGNOSIS — R002 Palpitations: Secondary | ICD-10-CM

## 2016-09-21 DIAGNOSIS — Z8673 Personal history of transient ischemic attack (TIA), and cerebral infarction without residual deficits: Secondary | ICD-10-CM | POA: Diagnosis not present

## 2016-09-21 DIAGNOSIS — I5032 Chronic diastolic (congestive) heart failure: Secondary | ICD-10-CM | POA: Diagnosis not present

## 2016-09-21 DIAGNOSIS — E1159 Type 2 diabetes mellitus with other circulatory complications: Secondary | ICD-10-CM | POA: Insufficient documentation

## 2016-09-21 DIAGNOSIS — J45909 Unspecified asthma, uncomplicated: Secondary | ICD-10-CM | POA: Insufficient documentation

## 2016-09-21 DIAGNOSIS — Z794 Long term (current) use of insulin: Secondary | ICD-10-CM | POA: Insufficient documentation

## 2016-09-21 DIAGNOSIS — Z7902 Long term (current) use of antithrombotics/antiplatelets: Secondary | ICD-10-CM | POA: Diagnosis not present

## 2016-09-21 DIAGNOSIS — I110 Hypertensive heart disease with heart failure: Secondary | ICD-10-CM | POA: Insufficient documentation

## 2016-09-21 DIAGNOSIS — Z7982 Long term (current) use of aspirin: Secondary | ICD-10-CM | POA: Insufficient documentation

## 2016-09-21 DIAGNOSIS — F17200 Nicotine dependence, unspecified, uncomplicated: Secondary | ICD-10-CM | POA: Insufficient documentation

## 2016-09-21 DIAGNOSIS — N939 Abnormal uterine and vaginal bleeding, unspecified: Secondary | ICD-10-CM

## 2016-09-21 DIAGNOSIS — Z79899 Other long term (current) drug therapy: Secondary | ICD-10-CM | POA: Insufficient documentation

## 2016-09-21 LAB — RAPID URINE DRUG SCREEN, HOSP PERFORMED
AMPHETAMINES: NOT DETECTED
BENZODIAZEPINES: NOT DETECTED
Barbiturates: NOT DETECTED
COCAINE: NOT DETECTED
OPIATES: NOT DETECTED
Tetrahydrocannabinol: NOT DETECTED

## 2016-09-21 LAB — I-STAT CHEM 8, ED
BUN: 19 mg/dL (ref 6–20)
CREATININE: 1.2 mg/dL — AB (ref 0.44–1.00)
Calcium, Ion: 1.13 mmol/L — ABNORMAL LOW (ref 1.15–1.40)
Chloride: 101 mmol/L (ref 101–111)
Glucose, Bld: 136 mg/dL — ABNORMAL HIGH (ref 65–99)
HEMATOCRIT: 37 % (ref 36.0–46.0)
HEMOGLOBIN: 12.6 g/dL (ref 12.0–15.0)
POTASSIUM: 3.2 mmol/L — AB (ref 3.5–5.1)
SODIUM: 142 mmol/L (ref 135–145)
TCO2: 29 mmol/L (ref 0–100)

## 2016-09-21 LAB — TSH: TSH: 0.478 u[IU]/mL (ref 0.350–4.500)

## 2016-09-21 LAB — I-STAT TROPONIN, ED: TROPONIN I, POC: 0 ng/mL (ref 0.00–0.08)

## 2016-09-21 LAB — CBG MONITORING, ED: Glucose-Capillary: 57 mg/dL — ABNORMAL LOW (ref 65–99)

## 2016-09-21 MED ORDER — POTASSIUM CHLORIDE CRYS ER 20 MEQ PO TBCR
40.0000 meq | EXTENDED_RELEASE_TABLET | Freq: Once | ORAL | Status: AC
  Administered 2016-09-21: 40 meq via ORAL
  Filled 2016-09-21: qty 2

## 2016-09-21 MED ORDER — POTASSIUM CHLORIDE CRYS ER 20 MEQ PO TBCR: 20.0000 meq | EXTENDED_RELEASE_TABLET | Freq: Every day | ORAL | 0 refills | Status: DC

## 2016-09-21 NOTE — ED Notes (Signed)
Patient moved from hallway to room; patient undressed, in gown, on monitor, continuous pulse oximetry and blood pressure cuff; patient also provided an urine sample

## 2016-09-21 NOTE — ED Provider Notes (Signed)
[facility] DEPT Provider Note   CSN: [id number] Arrival date & time: 09/21/16  1244     History   Chief Complaint Chief Complaint  Patient presents with  . Headache    HPI Lisa Anderson is a 60 y.o. female.  She has multiple complaints. Primary complaint is that she intimately feels weak intermittently feels palpitations in her chest. She is held up recorder in for greater than a year. She had followed with Dr. all red. This was read last July and she was told it was normal. She was admitted for palpitations and near syncope in a hospital near [place] week ago. She spent the night. She had a normal CT scan of her head. She states she had a stress test that was normal. She was given potassium replacement. Was given a potassium prescription but did not fill it. She has since moved back to [place]. States she's having some intermittent vaginal bleeding for the last 4-5 days after undergoing menopause more than 10 years ago. She describes a feeling in her chest as well as "beating so hard like his skin up on the top of my head off". She still feels this while she is presently emergent today and shows a normal rhythm and rate on her monitor.  She denies anxiety that seems anxious with her description and on her presentation today.  HPI  Past Medical History:  Diagnosis Date  . Asthma   . Cancer (HCC)    breast  . CHF (congestive heart failure) ([facility])   . Diabetes mellitus   . Hyperlipidemia   . Hypertension     Patient Active Problem List   Diagnosis Date Noted  . Diabetes mellitus with complication ([place])   . Asthma exacerbation 11/12/2015  . Acute respiratory failure ([place]) 11/11/2015  . TIA (transient ischemic attack) 11/01/2015  . Chronic diastolic CHF (congestive heart failure) ([place]) 11/01/2015  . Acute focal neurological deficit 11/01/2015  . Type 2 diabetes mellitus with circulatory disorder ([place])   . HLD (hyperlipidemia)   . Palpitations   . Stroke ([place]) 08/05/2015   . Hypertension 08/05/2015  . Stroke due to embolism of right posterior cerebral artery ([place]) 08/05/2015  . Diabetes mellitus 08/05/2015  . Alcohol dependency ([place]) 08/10/2013  . Cocaine abuse 08/08/2013  . Alcohol abuse 08/08/2013  . MDD (major depressive disorder) 08/08/2013    Past Surgical History:  Procedure Laterality Date  . BREAST LUMPECTOMY    . EP IMPLANTABLE DEVICE N/A 08/07/2015   Procedure: Loop Recorder Insertion;  Surgeon: Thompson Grayer, MD;  Location: [place] [facility];  Service: Cardiovascular;  Laterality: N/A;  . TEE WITHOUT CARDIOVERSION N/A 08/07/2015   Procedure: TRANSESOPHAGEAL ECHOCARDIOGRAM (TEE);  Surgeon: Sueanne Margarita, MD;  Location: [facility] ENDOSCOPY;  Service: Cardiovascular;  Laterality: N/A;  . TONSILLECTOMY      OB History    No data available       Home Medications    Prior to Admission medications   Medication Sig Start Date End Date Taking? Authorizing Provider  albuterol (PROVENTIL HFA;VENTOLIN HFA) 108 (90 Base) MCG/ACT inhaler Inhale 2 puffs into the lungs every 6 (six) hours as needed for wheezing or shortness of breath. Shortness of breath 11/16/15  Yes Velvet Bathe, MD  aspirin EC 325 MG tablet Take 325 mg by mouth daily.   Yes Historical Provider, MD  beclomethasone (QVAR) 80 MCG/ACT inhaler Inhale 1 puff into the lungs 2 (two) times daily. 08/04/16  Yes Historical Provider, MD  clopidogrel (PLAVIX) 75 MG  tablet Take 1 tablet (75 mg total) by mouth daily. 12/21/15  Yes Delsa Grana, PA-C  diltiazem (CARDIZEM) 60 MG tablet Take 1 tablet (60 mg total) by mouth 4 (four) times daily. 12/21/15  Yes Delsa Grana, PA-C  DULoxetine (CYMBALTA) 30 MG capsule Take 1 capsule (30 mg total) by mouth daily. 09/02/15  Yes Arnoldo Morale, MD  escitalopram (LEXAPRO) 20 MG tablet Take 20 mg by mouth daily.   Yes Historical Provider, MD  folic acid (FOLVITE) 1 MG tablet Take 1 tablet (1 mg total) by mouth daily. 11/02/15  Yes Geradine Girt, DO  glipiZIDE (GLUCOTROL) 5  MG tablet Take 1 tablet (5 mg total) by mouth 2 (two) times daily before a meal. 08/07/15  Yes Ripudeep K Rai, MD  hydrALAZINE (APRESOLINE) 50 MG tablet Take 1 tablet (50 mg total) by mouth 3 (three) times daily. 12/21/15  Yes Delsa Grana, PA-C  hydrochlorothiazide (HYDRODIURIL) 25 MG tablet Take 25 mg by mouth daily. 09/02/16  Yes Historical Provider, MD  insulin glargine (LANTUS) 100 UNIT/ML injection Inject 15 Units into the skin at bedtime.   Yes Historical Provider, MD  metFORMIN (GLUCOPHAGE) 1000 MG tablet Take 1 tablet (1,000 mg total) by mouth 2 (two) times daily with a meal. 08/07/15  Yes Ripudeep K Rai, MD  rosuvastatin (CRESTOR) 40 MG tablet Take 1 tablet (40 mg total) by mouth every evening. Patient taking differently: Take 20 mg by mouth every evening.  12/21/15  Yes Delsa Grana, PA-C  traZODone (DESYREL) 100 MG tablet Take 100 mg by mouth at bedtime. 08/04/16  Yes Historical Provider, MD  benzonatate (TESSALON) 100 MG capsule Take 1 capsule (100 mg total) by mouth 3 (three) times daily as needed for cough. Patient not taking: Reported on 09/21/2016 11/16/15   Velvet Bathe, MD  doxycycline (VIBRA-TABS) 100 MG tablet Take 1 tablet (100 mg total) by mouth every 12 (twelve) hours. Patient not taking: Reported on 09/21/2016 11/16/15   Velvet Bathe, MD  insulin aspart (NOVOLOG) 100 UNIT/ML injection Sliding scale CBG 70 - 120: 0 units CBG 121 - 150: 1 unit,  CBG 151 - 200: 2 units,  CBG 201 - 250: 3 units,  CBG 251 - 300: 5 units,  CBG 301 - 350: 7 units,  CBG 351 - 400: 9 units   CBG > 400: 9 units and notify your MD Patient not taking: Reported on 09/21/2016 08/07/15   Ripudeep Krystal Eaton, MD  Multiple Vitamin (MULTIVITAMIN WITH MINERALS) TABS tablet Take 1 tablet by mouth daily. Patient not taking: Reported on 09/21/2016 11/02/15   Geradine Girt, DO  potassium chloride SA (K-DUR,KLOR-CON) 20 MEQ tablet Take 1 tablet (20 mEq total) by mouth daily. 09/21/16   Tanna Furry, MD  thiamine 100 MG tablet Take  1 tablet (100 mg total) by mouth daily. Patient not taking: Reported on 09/21/2016 11/02/15   Geradine Girt, DO  triamterene-hydrochlorothiazide (MAXZIDE-25) 37.5-25 MG tablet Take 1 tablet by mouth daily. Patient not taking: Reported on 09/21/2016 12/21/15   Delsa Grana, PA-C    Family History Family History  Problem Relation Age of Onset  . Cancer Mother   . Diabetes Mother   . Hyperlipidemia Mother   . Hypertension Mother   . Rheum arthritis Father   . Asthma Father   . Stroke Father   . Thyroid disease Father   . Thyroid disease Brother     Social History Social History  Substance Use Topics  . Smoking status: Current Some Day Smoker  .  Smokeless tobacco: Not on file  . Alcohol use 3.6 oz/week    6 Cans of beer per week     Allergies   Prednisone; Benicar [olmesartan]; Celexa [citalopram]; Effexor [venlafaxine]; and Shrimp [shellfish allergy]   Review of Systems Review of Systems   Physical Exam Updated Vital Signs BP 164/83   Pulse 77   Temp 98.1 F (36.7 C) (Oral)   Resp 17   Ht 5\' 5"  (1.651 m)   Wt 160 lb (72.6 kg)   SpO2 97%   BMI 26.63 kg/m   Physical Exam  Constitutional: She is oriented to person, place, and time. She appears well-developed and well-nourished. No distress.  HENT:  Head: Normocephalic.  Eyes: Conjunctivae are normal. Pupils are equal, round, and reactive to light. No scleral icterus.  Neck: Normal range of motion. Neck supple. No thyromegaly present.  No thyromegaly  Cardiovascular: Normal rate and regular rhythm.  Exam reveals no gallop and no friction rub.   No murmur heard. Normal rate and rhythm. Sinus on the monitor. Not a single PVC as I examine her.  Pulmonary/Chest: Effort normal and breath sounds normal. No respiratory distress. She has no wheezes. She has no rales.  Abdominal: Soft. Bowel sounds are normal. She exhibits no distension. There is no tenderness. There is no rebound.  Musculoskeletal: Normal range of motion.   Neurological: She is alert and oriented to person, place, and time.  Skin: Skin is warm and dry. No rash noted.  Psychiatric: She has a normal mood and affect. Her behavior is normal.     ED Treatments / Results  Labs (all labs ordered are listed, but only abnormal results are displayed) Labs Reviewed  I-STAT CHEM 8, ED - Abnormal; Notable for the following:       Result Value   Potassium 3.2 (*)    Creatinine, Ser 1.20 (*)    Glucose, Bld 136 (*)    Calcium, Ion 1.13 (*)    All other components within normal limits  CBG MONITORING, ED - Abnormal; Notable for the following:    Glucose-Capillary 57 (*)    All other components within normal limits  RAPID URINE DRUG SCREEN, HOSP PERFORMED  TSH  I-STAT TROPOININ, ED    EKG  EKG Interpretation None       Radiology No results found.  Procedures Procedures (including critical care time)  Medications Ordered in ED Medications - No data to display   Initial Impression / Assessment and Plan / ED Course  I have reviewed the triage vital signs and the nursing notes.  Pertinent labs & imaging results that were available during my care of the patient were reviewed by me and considered in my medical decision making (see chart for details).  Clinical Course     I discussed with patient the postman possibly can be suggestive of endometrial cancer. Recommended that she follow up with GYN. Also recommend she follow-up with her cardiologist regarding her loop recorder. Potassium is 3.2. This will be supplemented she'll be discharged with a prescription per she was given a prescription from her hospitalization, oral 2 weeks ago but did not fill it. She is not sure why.   Final Clinical Impressions(s) / ED Diagnoses   Final diagnoses:  Vaginal bleeding  Palpitations  Hypokalemia    New Prescriptions New Prescriptions   POTASSIUM CHLORIDE SA (K-DUR,KLOR-CON) 20 MEQ TABLET    Take 1 tablet (20 mEq total) by mouth daily.       Tanna Furry,  MD 09/21/16 1607

## 2016-09-21 NOTE — Discharge Instructions (Signed)
Postmenopausal vaginal bleeding can be an indication of uterine cancer. You need to see a GYN to discuss a endometrial biopsy. [facility] to schedule appointment  Call Dr. Rayann Heman to discuss follow-up regarding her palpitations, and to read your Loop recorder.  Take potassium as prescribed.

## 2016-09-21 NOTE — ED Triage Notes (Signed)
Pt brought in by EMS due to having head pressure when waking up this am. Pt has hx of TIA and HTN. Pt BP 170/90 per EMS. Pt a&ox4.

## 2016-09-27 ENCOUNTER — Telehealth: Payer: Self-pay | Admitting: *Deleted

## 2016-09-27 NOTE — Telephone Encounter (Signed)
Spoke with patient.  She attempted to plug in monitor, but it was having trouble finding cell signal.  Patient requests to call back tomorrow once monitor has loaded.  [facility] phone number for return call.  Patient is appreciative and denies additional questions.

## 2016-09-27 NOTE — Telephone Encounter (Signed)
Patient called because she was recently seen in the ED for palpitations and when she was discharged she was told to reestablish follow-up for her LINQ.  She is not having symptoms at this time.  Patient had been unenrolled from [facility] in [id number] as certified letter was returned unsigned, no mail forwarding address.  Patient still has monitor, and is agreeable to bringing monitor with her to a [facility] appointment on 10/03/16 at 2:30pm.  She also gave updated phone number and confirmed mailing address.  Will plan to reenroll in [facility] and perform ILR education regarding home monitor.  Patient is appreciative of call and denies additional questions or concerns at this time.  After hanging up, able to reenroll patient in [place].  LM with family member requesting call back, gave [facility] phone number.  Will attempt to assist patient in setting up home monitor to perform manual transmission.

## 2016-09-28 NOTE — Telephone Encounter (Signed)
Episodes reviewed by Chanetta Marshall, NP.  Plan to possibly reprogram tachy episode detection.  Patient is agreeable to appointment with Chanetta Marshall, NP, on 09/30/16 at 8:20am.  She is appreciative of call an denies additional questions or concerns at this time.

## 2016-09-28 NOTE — Telephone Encounter (Signed)
Manual transmission received.  Tachy episodes printed for review by provider.

## 2016-09-28 NOTE — Progress Notes (Deleted)
Electrophysiology Office Note Date: 09/28/2016  ID:  Lisa Anderson, DOB Mar 13, 1956, MRN [id number]  PCP: Pcp Not In System Electrophysiologist: Allred  CC: loop recorder follow up  Lisa Anderson is a 61 y.o. female seen today for Dr Rayann Heman.    {He/she (caps):30048} presents today for routine electrophysiology followup.  Since last being seen in our clinic, the patient reports doing very well.  {He/she (caps):30048} denies chest pain, palpitations, dyspnea, PND, orthopnea, nausea, vomiting, dizziness, syncope, edema, weight gain, or early satiety.  Past Medical History:  Diagnosis Date  . Asthma   . Cancer (HCC)    breast  . CHF (congestive heart failure) ([place])   . Diabetes mellitus   . Hyperlipidemia   . Hypertension    Past Surgical History:  Procedure Laterality Date  . BREAST LUMPECTOMY    . EP IMPLANTABLE DEVICE N/A 08/07/2015   Procedure: Loop Recorder Insertion;  Surgeon: Thompson Grayer, MD;  Location: [facility];  Service: Cardiovascular;  Laterality: N/A;  . TEE WITHOUT CARDIOVERSION N/A 08/07/2015   Procedure: TRANSESOPHAGEAL ECHOCARDIOGRAM (TEE);  Surgeon: Sueanne Margarita, MD;  Location: [facility] ENDOSCOPY;  Service: Cardiovascular;  Laterality: N/A;  . TONSILLECTOMY      Current Outpatient Prescriptions  Medication Sig Dispense Refill  . albuterol (PROVENTIL HFA;VENTOLIN HFA) 108 (90 Base) MCG/ACT inhaler Inhale 2 puffs into the lungs every 6 (six) hours as needed for wheezing or shortness of breath. Shortness of breath 1 Inhaler 0  . aspirin EC 325 MG tablet Take 325 mg by mouth daily.    . beclomethasone (QVAR) 80 MCG/ACT inhaler Inhale 1 puff into the lungs 2 (two) times daily.    . benzonatate (TESSALON) 100 MG capsule Take 1 capsule (100 mg total) by mouth 3 (three) times daily as needed for cough. (Patient not taking: Reported on 09/21/2016) 20 capsule 0  . clopidogrel (PLAVIX) 75 MG tablet Take 1 tablet (75 mg total) by mouth daily. 30 tablet 0  .  diltiazem (CARDIZEM) 60 MG tablet Take 1 tablet (60 mg total) by mouth 4 (four) times daily. 120 tablet 0  . doxycycline (VIBRA-TABS) 100 MG tablet Take 1 tablet (100 mg total) by mouth every 12 (twelve) hours. (Patient not taking: Reported on 09/21/2016) 4 tablet 0  . DULoxetine (CYMBALTA) 30 MG capsule Take 1 capsule (30 mg total) by mouth daily. 30 capsule 0  . escitalopram (LEXAPRO) 20 MG tablet Take 20 mg by mouth daily.    . folic acid (FOLVITE) 1 MG tablet Take 1 tablet (1 mg total) by mouth daily.    Marland Kitchen glipiZIDE (GLUCOTROL) 5 MG tablet Take 1 tablet (5 mg total) by mouth 2 (two) times daily before a meal. 60 tablet 3  . hydrALAZINE (APRESOLINE) 50 MG tablet Take 1 tablet (50 mg total) by mouth 3 (three) times daily. 90 tablet 0  . hydrochlorothiazide (HYDRODIURIL) 25 MG tablet Take 25 mg by mouth daily.    . insulin aspart (NOVOLOG) 100 UNIT/ML injection Sliding scale CBG 70 - 120: 0 units CBG 121 - 150: 1 unit,  CBG 151 - 200: 2 units,  CBG 201 - 250: 3 units,  CBG 251 - 300: 5 units,  CBG 301 - 350: 7 units,  CBG 351 - 400: 9 units   CBG > 400: 9 units and notify your MD (Patient not taking: Reported on 09/21/2016) 1 vial 12  . insulin glargine (LANTUS) 100 UNIT/ML injection Inject 15 Units into the skin at bedtime.    Marland Kitchen  metFORMIN (GLUCOPHAGE) 1000 MG tablet Take 1 tablet (1,000 mg total) by mouth 2 (two) times daily with a meal. 60 tablet 3  . Multiple Vitamin (MULTIVITAMIN WITH MINERALS) TABS tablet Take 1 tablet by mouth daily. (Patient not taking: Reported on 09/21/2016)    . potassium chloride SA (K-DUR,KLOR-CON) 20 MEQ tablet Take 1 tablet (20 mEq total) by mouth daily. 30 tablet 0  . rosuvastatin (CRESTOR) 40 MG tablet Take 1 tablet (40 mg total) by mouth every evening. (Patient taking differently: Take 20 mg by mouth every evening. ) 30 tablet 0  . thiamine 100 MG tablet Take 1 tablet (100 mg total) by mouth daily. (Patient not taking: Reported on 09/21/2016) 30 tablet 0  . traZODone  (DESYREL) 100 MG tablet Take 100 mg by mouth at bedtime.    . triamterene-hydrochlorothiazide (MAXZIDE-25) 37.5-25 MG tablet Take 1 tablet by mouth daily. (Patient not taking: Reported on 09/21/2016) 30 tablet 0   No current facility-administered medications for this visit.     Allergies:   Prednisone; Benicar [olmesartan]; Celexa [citalopram]; Effexor [venlafaxine]; and Shrimp [shellfish allergy]   Social History: Social History   Social History  . Marital status: Single    Spouse name: N/A  . Number of children: 5  . Years of education: 12   Occupational History  . CNA- [employer]    Social History Main Topics  . Smoking status: Current Some Day Smoker  . Smokeless tobacco: Not on file  . Alcohol use 3.6 oz/week    6 Cans of beer per week  . Drug use:     Types: "Crack" cocaine  . Sexual activity: No     Comment: recovering cocaine addict x6 months   Other Topics Concern  . Not on file   Social History Narrative   Lives with daughter   Caffeine use: Drinks soda (2- 20 oz soda per day)    Family History: Family History  Problem Relation Age of Onset  . Cancer Mother   . Diabetes Mother   . Hyperlipidemia Mother   . Hypertension Mother   . Rheum arthritis Father   . Asthma Father   . Stroke Father   . Thyroid disease Father   . Thyroid disease Brother     Review of Systems: All other systems reviewed and are otherwise negative except as noted above.   Physical Exam: VS:  There were no vitals taken for this visit. , BMI There is no height or weight on file to calculate BMI. Wt Readings from Last 3 Encounters:  09/21/16 160 lb (72.6 kg)  11/18/15 160 lb (72.6 kg)  11/15/15 160 lb 12.8 oz (72.9 kg)    GEN- The patient is well appearing, alert and oriented x 3 today.   HEENT: normocephalic, atraumatic; sclera clear, conjunctiva pink; hearing intact; oropharynx clear; neck supple Lungs- Clear to ausculation bilaterally, normal work of  breathing.  No wheezes, rales, rhonchi Heart- Regular rate and rhythm, no murmurs, rubs or gallops, PMI not laterally displaced GI- soft, non-tender, non-distended, bowel sounds present, no hepatosplenomegaly Extremities- no clubbing, cyanosis, or edema; DP/PT/radial pulses 2+ bilaterally MS- no significant deformity or atrophy Skin- warm and dry, no rash or lesion  Psych- euthymic mood, full affect Neuro- strength and sensation are intact   EKG:  EKG is ordered today. The ekg ordered today shows ***  Recent Labs: 12/21/2015: ALT 16; B Natriuretic Peptide 32.6; Platelets 384 09/21/2016: BUN 19; Creatinine, Ser 1.20; Hemoglobin 12.6; Potassium 3.2; Sodium 142;  TSH 0.478    Other studies Reviewed: Additional studies/ records that were reviewed today include: ***  Review of the above records today demonstrates: ***  Assessment and Plan: 1.  Cryptogenic stroke Patient has been lost to follow up since implant She called recently with complaints of palpitations and loop recorder interrogation demonstrated ***    Current medicines are reviewed at length with the patient today.   The patient {ACTIONS; HAS/DOES NOT HAVE:19233} concerns regarding her medicines.  The following changes were made today:  {NONE DEFAULTED:18576::"none"}  Labs/ tests ordered today include: *** No orders of the defined types were placed in this encounter.    Disposition:   Follow up with *** {gen number [id number] {TIME; UNITS DAY/WEEK/MONTH:19136}   Signed, Chanetta Marshall, NP 09/28/2016 6:03 PM   [place] [address] [phone] (office) [phone] (fax

## 2016-09-30 ENCOUNTER — Encounter: Payer: Self-pay | Admitting: Nurse Practitioner

## 2016-10-04 ENCOUNTER — Other Ambulatory Visit: Payer: Self-pay | Admitting: Internal Medicine

## 2016-10-06 ENCOUNTER — Telehealth: Payer: Self-pay | Admitting: Internal Medicine

## 2016-10-06 NOTE — Telephone Encounter (Signed)
Called and spoke to the Oral surgeons office and let them know would need to get this information from [facility]

## 2016-10-06 NOTE — Telephone Encounter (Signed)
°  New Prob  Request for surgical clearance:  1. What type of surgery is being performed? Tooth extraction  2. When is this surgery scheduled? Not yet scheduled  3. Are there any medications that need to be held prior to surgery and how long? Requesting to hold ASA and Plavix  4. Name of physician performing surgery? Dr. Carroll Sage   5. What is your office phone and fax number? Phone Number: [phone] Fax: [phone]

## 2016-10-13 ENCOUNTER — Inpatient Hospital Stay: Payer: Self-pay | Admitting: Family Medicine

## 2016-10-27 ENCOUNTER — Ambulatory Visit (INDEPENDENT_AMBULATORY_CARE_PROVIDER_SITE_OTHER): Payer: Medicaid Other | Admitting: *Deleted

## 2016-10-27 DIAGNOSIS — I63011 Cerebral infarction due to thrombosis of right vertebral artery: Secondary | ICD-10-CM | POA: Diagnosis not present

## 2016-10-27 LAB — CUP PACEART REMOTE DEVICE CHECK
Implantable Pulse Generator Implant Date: 20161111
MDC IDC SESS DTM: 20180201230658

## 2016-10-31 NOTE — Progress Notes (Signed)
[facility] Summary Report / Loop Recorder 

## 2016-11-02 ENCOUNTER — Encounter: Payer: Self-pay | Admitting: Nurse Practitioner

## 2016-11-03 ENCOUNTER — Encounter: Payer: Self-pay | Admitting: Nurse Practitioner

## 2016-11-13 ENCOUNTER — Emergency Department (HOSPITAL_COMMUNITY): Payer: Medicaid Other

## 2016-11-13 ENCOUNTER — Encounter (HOSPITAL_COMMUNITY): Payer: Self-pay

## 2016-11-13 ENCOUNTER — Emergency Department (HOSPITAL_COMMUNITY)
Admission: EM | Admit: 2016-11-13 | Discharge: 2016-11-13 | Disposition: A | Discharge status: Home / Self Care | Payer: Medicaid Other | Attending: Emergency Medicine | Admitting: Emergency Medicine

## 2016-11-13 DIAGNOSIS — I5032 Chronic diastolic (congestive) heart failure: Secondary | ICD-10-CM | POA: Diagnosis not present

## 2016-11-13 DIAGNOSIS — Z8673 Personal history of transient ischemic attack (TIA), and cerebral infarction without residual deficits: Secondary | ICD-10-CM | POA: Diagnosis not present

## 2016-11-13 DIAGNOSIS — E119 Type 2 diabetes mellitus without complications: Secondary | ICD-10-CM | POA: Insufficient documentation

## 2016-11-13 DIAGNOSIS — Z853 Personal history of malignant neoplasm of breast: Secondary | ICD-10-CM | POA: Insufficient documentation

## 2016-11-13 DIAGNOSIS — Z7982 Long term (current) use of aspirin: Secondary | ICD-10-CM | POA: Insufficient documentation

## 2016-11-13 DIAGNOSIS — F17200 Nicotine dependence, unspecified, uncomplicated: Secondary | ICD-10-CM | POA: Insufficient documentation

## 2016-11-13 DIAGNOSIS — E876 Hypokalemia: Secondary | ICD-10-CM

## 2016-11-13 DIAGNOSIS — Z79899 Other long term (current) drug therapy: Secondary | ICD-10-CM | POA: Insufficient documentation

## 2016-11-13 DIAGNOSIS — J45909 Unspecified asthma, uncomplicated: Secondary | ICD-10-CM | POA: Diagnosis not present

## 2016-11-13 DIAGNOSIS — R519 Headache, unspecified: Secondary | ICD-10-CM

## 2016-11-13 DIAGNOSIS — I110 Hypertensive heart disease with heart failure: Secondary | ICD-10-CM | POA: Diagnosis not present

## 2016-11-13 DIAGNOSIS — R51 Headache: Secondary | ICD-10-CM | POA: Insufficient documentation

## 2016-11-13 DIAGNOSIS — Z794 Long term (current) use of insulin: Secondary | ICD-10-CM | POA: Diagnosis not present

## 2016-11-13 DIAGNOSIS — I10 Essential (primary) hypertension: Secondary | ICD-10-CM

## 2016-11-13 HISTORY — DX: Cerebral infarction, unspecified: I63.9

## 2016-11-13 LAB — BASIC METABOLIC PANEL
Anion gap: 10 (ref 5–15)
BUN: 15 mg/dL (ref 6–20)
CHLORIDE: 98 mmol/L — AB (ref 101–111)
CO2: 31 mmol/L (ref 22–32)
CREATININE: 1.18 mg/dL — AB (ref 0.44–1.00)
Calcium: 9.4 mg/dL (ref 8.9–10.3)
GFR calc non Af Amer: 50 mL/min — ABNORMAL LOW (ref >60)
GFR, EST AFRICAN AMERICAN: 58 mL/min — AB (ref >60)
Glucose, Bld: 102 mg/dL — ABNORMAL HIGH (ref 65–99)
Potassium: 3.1 mmol/L — ABNORMAL LOW (ref 3.5–5.1)
SODIUM: 139 mmol/L (ref 135–145)

## 2016-11-13 LAB — CBC WITH DIFFERENTIAL/PLATELET
BASOS PCT: 1 %
Basophils Absolute: 0.1 10*3/uL (ref 0.0–0.1)
EOS ABS: 1.4 10*3/uL — AB (ref 0.0–0.7)
Eosinophils Relative: 13 %
HCT: 38 % (ref 36.0–46.0)
HEMOGLOBIN: 11.7 g/dL — AB (ref 12.0–15.0)
Lymphocytes Relative: 33 %
Lymphs Abs: 3.4 10*3/uL (ref 0.7–4.0)
MCH: 27.1 pg (ref 26.0–34.0)
MCHC: 30.8 g/dL (ref 30.0–36.0)
MCV: 88 fL (ref 78.0–100.0)
MONOS PCT: 5 %
Monocytes Absolute: 0.5 10*3/uL (ref 0.1–1.0)
NEUTROS PCT: 48 %
Neutro Abs: 5 10*3/uL (ref 1.7–7.7)
Platelets: 272 10*3/uL (ref 150–400)
RBC: 4.32 MIL/uL (ref 3.87–5.11)
RDW: 12.8 % (ref 11.5–15.5)
WBC: 10.4 10*3/uL (ref 4.0–10.5)

## 2016-11-13 LAB — I-STAT TROPONIN, ED: TROPONIN I, POC: 0 ng/mL (ref 0.00–0.08)

## 2016-11-13 MED ORDER — DILTIAZEM HCL 60 MG PO TABS
60.0000 mg | ORAL_TABLET | Freq: Once | ORAL | Status: AC
  Administered 2016-11-13: 60 mg via ORAL
  Filled 2016-11-13: qty 1

## 2016-11-13 MED ORDER — HYDRALAZINE HCL 50 MG PO TABS: 50.0000 mg | ORAL_TABLET | Freq: Three times a day (TID) | ORAL | 2 refills | Status: DC

## 2016-11-13 MED ORDER — HYDROCHLOROTHIAZIDE 25 MG PO TABS
25.0000 mg | ORAL_TABLET | Freq: Once | ORAL | Status: AC
  Administered 2016-11-13: 25 mg via ORAL
  Filled 2016-11-13: qty 1

## 2016-11-13 MED ORDER — HYDRALAZINE HCL 50 MG PO TABS
50.0000 mg | ORAL_TABLET | Freq: Once | ORAL | Status: AC
  Administered 2016-11-13: 50 mg via ORAL
  Filled 2016-11-13: qty 1

## 2016-11-13 MED ORDER — POTASSIUM CHLORIDE CRYS ER 20 MEQ PO TBCR
40.0000 meq | EXTENDED_RELEASE_TABLET | Freq: Once | ORAL | Status: AC
  Administered 2016-11-13: 40 meq via ORAL
  Filled 2016-11-13: qty 2

## 2016-11-13 NOTE — ED Triage Notes (Signed)
Pt arrived via EMS due to HTN x 2 weeks; pt has med change 4 weeks ago and believes it is causing HTN and dizziness; pt has some life stressors present; Pt has hx of stroke; pt states HA at 6/10; Pt a&o x 4 on arrival

## 2016-11-13 NOTE — ED Provider Notes (Signed)
[facility] DEPT Provider Note   CSN: [id number] Arrival date & time: 11/13/16  [id number]     History   Chief Complaint Chief Complaint  Patient presents with  . Hypertension    HPI Lisa Anderson is a 61 y.o. female.  HPI Lisa Anderson is a 61 y.o. female with history of diabetes, hypertension, prior CVA, CHF, presents to emergency department complaining of headache and elevated blood pressure. Patient states that her headache started while at work. She states that her blood pressure has been high over the last week. When she was complaining of blood pressure at work, and patient works at the assisted living facility, they checked her blood pressure and it was [id number] systolic. EMS was called. Patient  denies any numbness or weakness in extremities, denies any blurred vision, reports some dizziness when ambulating. States that she stopped taking her hydralazine 2 weeks ago when she ran out. She still taking her Cardizem and hydrochlorothiazide. Patient did not take any of her medications this morning.  Past Medical History:  Diagnosis Date  . Asthma   . Cancer (HCC)    breast  . CHF (congestive heart failure) ([place])   . Diabetes mellitus   . Hyperlipidemia   . Hypertension   . Stroke [facility])     Patient Active Problem List   Diagnosis Date Noted  . Diabetes mellitus with complication ([place])   . Asthma exacerbation 11/12/2015  . Acute respiratory failure ([place]) 11/11/2015  . TIA (transient ischemic attack) 11/01/2015  . Chronic diastolic CHF (congestive heart failure) ([place]) 11/01/2015  . Acute focal neurological deficit 11/01/2015  . Type 2 diabetes mellitus with circulatory disorder ([place])   . HLD (hyperlipidemia)   . Palpitations   . Stroke ([place]) 08/05/2015  . Hypertension 08/05/2015  . 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 Diabetes mellitus 08/05/2015  . Alcohol dependency ([place]) 08/10/2013  . Cocaine abuse 08/08/2013  . Alcohol abuse  08/08/2013  . MDD (major depressive disorder) 08/08/2013    Past Surgical History:  Procedure Laterality Date  . BREAST LUMPECTOMY    . EP IMPLANTABLE DEVICE N/A 08/07/2015   Procedure: Loop Recorder Insertion;  Surgeon: Thompson Grayer, MD;  Location: [facility];  Service: Cardiovascular;  Laterality: N/A;  . TEE WITHOUT CARDIOVERSION N/A 08/07/2015   Procedure: TRANSESOPHAGEAL ECHOCARDIOGRAM (TEE);  Surgeon: Sueanne Margarita, MD;  Location: [facility] ENDOSCOPY;  Service: Cardiovascular;  Laterality: N/A;  . TONSILLECTOMY      OB History    No data available       Home Medications    Prior to Admission medications   Medication Sig Start Date End Date Taking? Authorizing Provider  albuterol (PROVENTIL HFA;VENTOLIN HFA) 108 (90 Base) MCG/ACT inhaler Inhale 2 puffs into the lungs every 6 (six) hours as needed for wheezing or shortness of breath. Shortness of breath 11/16/15  Yes Velvet Bathe, MD  aspirin EC 325 MG tablet Take 325 mg by mouth daily.   Yes Historical Provider, MD  beclomethasone (QVAR) 80 MCG/ACT inhaler Inhale 1 puff into the lungs 2 (two) times daily. 08/04/16  Yes Historical Provider, MD  clopidogrel (PLAVIX) 75 MG tablet Take 1 tablet (75 mg total) by mouth daily. 12/21/15  Yes Delsa Grana, PA-C  diltiazem (CARDIZEM) 60 MG tablet Take 1 tablet (60 mg total) by mouth 4 (four) times daily. 12/21/15  Yes Delsa Grana, PA-C  DULoxetine (CYMBALTA) 30 MG capsule Take 1 capsule (30 mg total) by mouth daily. 09/02/15  Yes Arnoldo Morale, MD  escitalopram (LEXAPRO) 20 MG tablet Take 20 mg by mouth daily.   Yes Historical Provider, MD  glipiZIDE (GLUCOTROL) 5 MG tablet Take 1 tablet (5 mg total) by mouth 2 (two) times daily before a meal. 08/07/15  Yes Ripudeep K Rai, MD  hydrALAZINE (APRESOLINE) 50 MG tablet Take 1 tablet (50 mg total) by mouth 3 (three) times daily. Patient taking differently: Take 50 mg by mouth 3 (three) times daily. --Pt out of refills 12/21/15  Yes Delsa Grana, PA-C    hydrochlorothiazide (HYDRODIURIL) 25 MG tablet Take 25 mg by mouth daily. 09/02/16  Yes Historical Provider, MD  insulin glargine (LANTUS) 100 UNIT/ML injection Inject 15 Units into the skin at bedtime.   Yes Historical Provider, MD  metFORMIN (GLUCOPHAGE) 1000 MG tablet Take 1 tablet (1,000 mg total) by mouth 2 (two) times daily with a meal. 08/07/15  Yes Ripudeep K Rai, MD  potassium chloride SA (K-DUR,KLOR-CON) 20 MEQ tablet Take 1 tablet (20 mEq total) by mouth daily. 09/21/16  Yes Tanna Furry, MD  rosuvastatin (CRESTOR) 20 MG tablet Take 20 mg by mouth daily.   Yes Historical Provider, MD  traZODone (DESYREL) 100 MG tablet Take 100 mg by mouth at bedtime. 08/04/16  Yes Historical Provider, MD  benzonatate (TESSALON) 100 MG capsule Take 1 capsule (100 mg total) by mouth 3 (three) times daily as needed for cough. Patient not taking: Reported on 09/21/2016 11/16/15   Velvet Bathe, MD  doxycycline (VIBRA-TABS) 100 MG tablet Take 1 tablet (100 mg total) by mouth every 12 (twelve) hours. Patient not taking: Reported on 09/21/2016 11/16/15   Velvet Bathe, MD  folic acid (FOLVITE) 1 MG tablet Take 1 tablet (1 mg total) by mouth daily. Patient not taking: Reported on 11/13/2016 11/02/15   Geradine Girt, DO  insulin aspart (NOVOLOG) 100 UNIT/ML injection Sliding scale CBG 70 - 120: 0 units CBG 121 - 150: 1 unit,  CBG 151 - 200: 2 units,  CBG 201 - 250: 3 units,  CBG 251 - 300: 5 units,  CBG 301 - 350: 7 units,  CBG 351 - 400: 9 units   CBG > 400: 9 units and notify your MD Patient not taking: Reported on 09/21/2016 08/07/15   Ripudeep Krystal Eaton, MD  Multiple Vitamin (MULTIVITAMIN WITH MINERALS) TABS tablet Take 1 tablet by mouth daily. Patient not taking: Reported on 09/21/2016 11/02/15   Geradine Girt, DO  rosuvastatin (CRESTOR) 40 MG tablet Take 1 tablet (40 mg total) by mouth every evening. Patient not taking: Reported on 11/13/2016 12/21/15   Delsa Grana, PA-C  thiamine 100 MG tablet Take 1 tablet (100 mg  total) by mouth daily. Patient not taking: Reported on 09/21/2016 11/02/15   Geradine Girt, DO  triamterene-hydrochlorothiazide (MAXZIDE-25) 37.5-25 MG tablet Take 1 tablet by mouth daily. Patient not taking: Reported on 09/21/2016 12/21/15   Delsa Grana, PA-C    Family History Family History  Problem Relation Age of Onset  . Cancer Mother   . Diabetes Mother   . Hyperlipidemia Mother   . Hypertension Mother   . Rheum arthritis Father   . Asthma Father   . Stroke Father   . Thyroid disease Father   . Thyroid disease Brother     Social History Social History  Substance Use Topics  . Smoking status: Current Some Day Smoker  . Smokeless tobacco: Not on file  . Alcohol use 3.6 oz/week    6 Cans of beer per  week     Allergies   Prednisone; Benicar [olmesartan]; Celexa [citalopram]; Effexor [venlafaxine]; and Shrimp [shellfish allergy]   Review of Systems Review of Systems  Constitutional: Negative for chills and fever.  Respiratory: Negative for cough, chest tightness and shortness of breath.   Cardiovascular: Negative for chest pain, palpitations and leg swelling.  Gastrointestinal: Negative for abdominal pain, diarrhea, nausea and vomiting.  Genitourinary: Negative for dysuria, flank pain and pelvic pain.  Musculoskeletal: Negative for arthralgias, myalgias, neck pain and neck stiffness.  Skin: Negative for rash.  Neurological: Positive for dizziness, light-headedness and headaches. Negative for weakness and numbness.  All other systems reviewed and are negative.    Physical Exam Updated Vital Signs BP 151/67   Pulse 73   Temp 98.5 F (36.9 C) (Oral)   Resp 11   SpO2 97%   Physical Exam  Constitutional: She is oriented to person, place, and time. She appears well-developed and well-nourished. No distress.  HENT:  Head: Normocephalic and atraumatic.  Eyes: Conjunctivae and EOM are normal. Pupils are equal, round, and reactive to light.  Neck: Normal range of  motion. Neck supple.  Cardiovascular: Normal rate, regular rhythm and normal heart sounds.   Pulmonary/Chest: Effort normal and breath sounds normal. No respiratory distress. She has no wheezes. She has no rales.  Abdominal: She exhibits no distension.  Musculoskeletal: Normal range of motion. She exhibits no edema.  Neurological: She is alert and oriented to person, place, and time.  5/5 and equal upper and lower extremity strength bilaterally. Equal grip strength bilaterally. Normal finger to nose and heel to shin. No pronator drift.   Skin: Skin is warm and dry.  Psychiatric: She has a normal mood and affect. Her behavior is normal.  Nursing note and vitals reviewed.    ED Treatments / Results  Labs (all labs ordered are listed, but only abnormal results are displayed) Labs Reviewed  CBC WITH DIFFERENTIAL/PLATELET - Abnormal; Notable for the following:       Result Value   Hemoglobin 11.7 (*)    Eosinophils Absolute 1.4 (*)    All other components within normal limits  BASIC METABOLIC PANEL - Abnormal; Notable for the following:    Potassium 3.1 (*)    Chloride 98 (*)    Glucose, Bld 102 (*)    Creatinine, Ser 1.18 (*)    GFR calc non Af Amer 50 (*)    GFR calc Af Amer 58 (*)    All other components within normal limits  I-STAT TROPOININ, ED    EKG  EKG Interpretation  Date/Time:  Sunday November 13 2016 07:53:32 EST Ventricular Rate:  67 PR Interval:    QRS Duration: 107 QT Interval:  438 QTC Calculation: 463 R Axis:   -6 Text Interpretation:  Sinus rhythm RSR' in V1 or V2, right VCD or RVH No significant change since last tracing Confirmed by ISAACS MD, Lysbeth Galas [phone]) on 11/13/2016 8:00:05 AM       Radiology Ct Head Wo Contrast  Result Date: 11/13/2016 CLINICAL DATA:  Headache for 1 week EXAM: CT HEAD WITHOUT CONTRAST TECHNIQUE: Contiguous axial images were obtained from the base of the skull through the vertex without intravenous contrast. COMPARISON:  Head CT  November 01, 2015 and brain MRI November 02, 2015 FINDINGS: Brain: The ventricles are normal in size and configuration. There is mild invagination of CSF into the sella. There is no intracranial mass, hemorrhage, extra-axial fluid collection, or midline shift. There is an old prior infarct  at the right parotid-occipital junction, stable. Elsewhere gray-white compartments appear normal. No evident acute infarct. Vascular: No hyperdense vessel. There is calcification in each carotid siphon region. Skull: Bony calvarium appears intact. Sinuses/Orbits: There is mucosal thickening in several ethmoid air cells bilaterally. There is a retention cyst in the posterior left sphenoid sinus region. There is mucosal thickening in visualized maxillary antra bilaterally. No air-fluid levels noted. Visualized orbits appear symmetric bilaterally. Other: Mastoids on the left are clear. There is opacification of several inferior mastoid air cells on the right. IMPRESSION: Prior infarct at the peripheral right parietal-occipital junction, stable. Elsewhere gray-white compartments are normal. No acute infarct. No hemorrhage, mass, or extra-axial fluid collection. There is a stable degree of mild empty sella. Vascular calcifications noted. Areas of paranasal sinus disease as well as mild inferior mastoid air cell disease on the right. Electronically Signed   By: Lowella Grip III M.D.   On: 11/13/2016 08:48    Procedures Procedures (including critical care time)  Medications Ordered in ED Medications  hydrochlorothiazide (HYDRODIURIL) tablet 25 mg (25 mg Oral Given 11/13/16 0810)  diltiazem (CARDIZEM) tablet 60 mg (60 mg Oral Given 11/13/16 [id number])  hydrALAZINE (APRESOLINE) tablet 50 mg (50 mg Oral Given 11/13/16 0809)     Initial Impression / Assessment and Plan / ED Course  I have reviewed the triage vital signs and the nursing notes.  Pertinent labs & imaging results that were available during my care of the patient were  reviewed by me and considered in my medical decision making (see chart for details).    Patient in emergency department with headache and elevated blood pressure, coming from work. She reports some dizziness, no other focal neurological complaints. Her exam is unremarkable. It emergency department her systolic blood pressure is 212. She has not had any of her medications yet. Will order her regular medications including hydralazine which she used to take but took herself off of 2 weeks ago. Will check basic labs. Will reassess.  9:57 AM Patient's blood pressure is down to [id number] systolic. She states she feels much better and her headache is gone. She states she is ready to be discharged home. We'll give her refill on her hydralazine. Instructed to follow-up with her family doctor for close recheck. Return precautions discussed. Patient is discharged home in stable condition with no headache, improved blood pressure, no complaints.  Vitals:   11/13/16 0800 11/13/16 0845 11/13/16 0900 11/13/16 0930  BP: 200/82 188/72 146/61 151/67  Pulse: 68 69 68 73  Resp: 17 10 17 11   Temp:      TempSrc:      SpO2: 98% 98% 98% 97%     Final Clinical Impressions(s) / ED Diagnoses   Final diagnoses:  Nonintractable headache, unspecified chronicity pattern, unspecified headache type  Hypertension, unspecified type  Hypokalemia    New Prescriptions New Prescriptions   HYDRALAZINE (APRESOLINE) 50 MG TABLET    Take 1 tablet (50 mg total) by mouth 3 (three) times daily.     Jeannett Senior, PA-C 11/13/16 1000    Duffy Bruce, MD 11/14/16 [place]

## 2016-11-13 NOTE — ED Notes (Signed)
Pt states she was at work and her temples were hurting.  Also noted some dizziness at times and unsteadiness on her feet.  Reports hx TIA x 3 and was told she has a small brain aneurysm.

## 2016-11-13 NOTE — Discharge Instructions (Signed)
Take Tylenol for your headache. Continue to take her blood pressure medications. Follow-up with primary care doctor. Return if worsening.

## 2016-11-28 ENCOUNTER — Ambulatory Visit (INDEPENDENT_AMBULATORY_CARE_PROVIDER_SITE_OTHER): Payer: Medicaid Other | Admitting: *Deleted

## 2016-11-28 DIAGNOSIS — I63011 Cerebral infarction due to thrombosis of right vertebral artery: Secondary | ICD-10-CM

## 2016-11-29 NOTE — Progress Notes (Signed)
[facility] Summary Report / Loop Recorder 

## 2016-12-10 LAB — CUP PACEART REMOTE DEVICE CHECK
MDC IDC PG IMPLANT DT: 20161111
MDC IDC SESS DTM: 20180304033518

## 2016-12-10 NOTE — Progress Notes (Signed)
[facility] summary report received. Battery status OK. Normal device function. No new symptom episodes, tachy episodes, brady, or pause episodes. No new AF episodes. Monthly summary reports and ROV/PRN 

## 2016-12-12 ENCOUNTER — Encounter: Payer: Self-pay | Admitting: Internal Medicine

## 2016-12-12 ENCOUNTER — Ambulatory Visit (INDEPENDENT_AMBULATORY_CARE_PROVIDER_SITE_OTHER): Payer: Medicaid Other | Admitting: Internal Medicine

## 2016-12-12 VITALS — BP 160/84 | HR 92 | Ht 65.0 in | Wt 161.6 lb

## 2016-12-12 DIAGNOSIS — I639 Cerebral infarction, unspecified: Secondary | ICD-10-CM | POA: Diagnosis not present

## 2016-12-12 DIAGNOSIS — I5043 Acute on chronic combined systolic (congestive) and diastolic (congestive) heart failure: Secondary | ICD-10-CM | POA: Diagnosis not present

## 2016-12-12 DIAGNOSIS — R0602 Shortness of breath: Secondary | ICD-10-CM | POA: Diagnosis not present

## 2016-12-12 DIAGNOSIS — I471 Supraventricular tachycardia, unspecified: Secondary | ICD-10-CM

## 2016-12-12 DIAGNOSIS — R519 Headache, unspecified: Secondary | ICD-10-CM

## 2016-12-12 DIAGNOSIS — R51 Headache: Secondary | ICD-10-CM | POA: Diagnosis not present

## 2016-12-12 DIAGNOSIS — I110 Hypertensive heart disease with heart failure: Secondary | ICD-10-CM | POA: Diagnosis not present

## 2016-12-12 LAB — CUP PACEART INCLINIC DEVICE CHECK
Date Time Interrogation Session: 20180319124519
MDC IDC PG IMPLANT DT: 20161111

## 2016-12-12 MED ORDER — HYDRALAZINE HCL 50 MG PO TABS: 50.0000 mg | ORAL_TABLET | Freq: Three times a day (TID) | ORAL | 3 refills | Status: DC

## 2016-12-12 MED ORDER — ESCITALOPRAM OXALATE 20 MG PO TABS: 20.0000 mg | ORAL_TABLET | Freq: Every day | ORAL | 0 refills | Status: DC

## 2016-12-12 MED ORDER — CLOPIDOGREL BISULFATE 75 MG PO TABS: 75.0000 mg | ORAL_TABLET | Freq: Every day | ORAL | 3 refills | Status: DC

## 2016-12-12 MED ORDER — POTASSIUM CHLORIDE CRYS ER 20 MEQ PO TBCR
20.0000 meq | EXTENDED_RELEASE_TABLET | Freq: Every day | ORAL | 3 refills | Status: DC
Start: 2016-12-12 — End: 2017-07-21

## 2016-12-12 MED ORDER — FUROSEMIDE 40 MG PO TABS
40.0000 mg | ORAL_TABLET | Freq: Every day | ORAL | 3 refills | Status: DC
Start: 2016-12-12 — End: 2017-02-15

## 2016-12-12 MED ORDER — DULOXETINE HCL 30 MG PO CPEP: 30.0000 mg | ORAL_CAPSULE | Freq: Every day | ORAL | 0 refills | Status: DC

## 2016-12-12 MED ORDER — ROSUVASTATIN CALCIUM 20 MG PO TABS: 40.0000 mg | ORAL_TABLET | Freq: Every day | ORAL | 3 refills | Status: DC

## 2016-12-12 MED ORDER — DILTIAZEM HCL ER COATED BEADS 360 MG PO CP24: 360.0000 mg | ORAL_CAPSULE | Freq: Every day | ORAL | 3 refills | Status: DC

## 2016-12-12 NOTE — Progress Notes (Signed)
Electrophysiology Office Note   Date:  12/12/2016   ID:  Lisa Anderson, DOB 13-Mar-1956, MRN [id number]  PCP:  Lisa Morale, MD  Primary Electrophysiologist: Lisa Grayer, MD    Chief Complaint  Patient presents with  . New Patient (Initial Visit)    Cryptogenic stroke     History of Present Illness: Lisa Anderson is a 61 y.o. female who presents today for electrophysiology evaluation.   I have not seen her since her ILR implant in 2016 in the setting of acute stroke.  She moved away for several years but has now moved back to [place].  She has had several recent hospital visits.  She has been told that she has  "Congestive heart faiulre" and has advised to follow-up with cardiology.  She has chronic difficulty with BP management and also underlying lung disease.  Prior echo reveals preserved EF at that time.  She reports having "normal stress test" several months ago in [facility]. She has migraines and worries that this could be due to previously observed aneurysm.  She has not yet reestablished with neurology.   She reports having recurrent TIAs.  No afib detected on ILR though she does have frequent palpitations.  ILR is reviewed at length today and reveals narrow complex SVT but no other arrhythmias.  She has taken short activing diltiazem chronically for palpitations.  + SOB intermittently  Today, she denies symptoms of  chest pain, , orthopnea, PND, lower extremity edema, claudication, dizziness, presyncope, syncope, bleeding, or neurologic sequela. The patient is tolerating medications without difficulties and is otherwise without complaint today.    Past Medical History:  Diagnosis Date  . Asthma   . Cancer ([place])    breast  . Chronic congestive heart failure with left ventricular diastolic dysfunction ([place])   . Diabetes mellitus   . Hyperlipidemia   . Hypertension   . Stroke [facility])    Past Surgical History:  Procedure Laterality Date  . BREAST LUMPECTOMY    . EP  IMPLANTABLE DEVICE N/A 08/07/2015   Procedure: Loop Recorder Insertion;  Surgeon: Lisa Grayer, MD;  Location: [place] CV LAB;  Service: Cardiovascular;  Laterality: N/A;  . TEE WITHOUT CARDIOVERSION N/A 08/07/2015   Procedure: TRANSESOPHAGEAL ECHOCARDIOGRAM (TEE);  Surgeon: Lisa Margarita, MD;  Location: [facility] ENDOSCOPY;  Service: Cardiovascular;  Laterality: N/A;  . TONSILLECTOMY       Current Outpatient Prescriptions  Medication Sig Dispense Refill  . albuterol (PROVENTIL HFA;VENTOLIN HFA) 108 (90 Base) MCG/ACT inhaler Inhale 2 puffs into the lungs every 6 (six) hours as needed for wheezing or shortness of breath. Shortness of breath 1 Inhaler 0  . aspirin EC 325 MG tablet Take 325 mg by mouth daily.    . beclomethasone (QVAR) 80 MCG/ACT inhaler Inhale 1 puff into the lungs 2 (two) times daily. Rinse mouth with water after use to reduce aftertaste and incidence of candidiasis. Do not swallow.    . benzonatate (TESSALON) 100 MG capsule Take 1 capsule (100 mg total) by mouth 3 (three) times daily as needed for cough. 20 capsule 0  . clopidogrel (PLAVIX) 75 MG tablet Take 1 tablet (75 mg total) by mouth daily. 90 tablet 3  . DULoxetine (CYMBALTA) 30 MG capsule Take 1 capsule (30 mg total) by mouth daily. 30 capsule 0  . escitalopram (LEXAPRO) 20 MG tablet Take 1 tablet (20 mg total) by mouth daily. 30 tablet 0  . folic acid (FOLVITE) 1 MG tablet Take 1 tablet (1 mg total) by  mouth daily.    Marland Kitchen glipiZIDE (GLUCOTROL) 5 MG tablet Take 1 tablet (5 mg total) by mouth 2 (two) times daily before a meal. 60 tablet 3  . hydrALAZINE (APRESOLINE) 50 MG tablet Take 1 tablet (50 mg total) by mouth 3 (three) times daily. 270 tablet 3  . insulin aspart (NOVOLOG) 100 UNIT/ML injection Sliding scale CBG 70 - 120: 0 units CBG 121 - 150: 1 unit,  CBG 151 - 200: 2 units,  CBG 201 - 250: 3 units,  CBG 251 - 300: 5 units,  CBG 301 - 350: 7 units,  CBG 351 - 400: 9 units   CBG > 400: 9 units and notify your MD 1 vial  12  . insulin glargine (LANTUS) 100 UNIT/ML injection Inject 15 Units into the skin at bedtime.    . metFORMIN (GLUCOPHAGE) 1000 MG tablet Take 1 tablet (1,000 mg total) by mouth 2 (two) times daily with a meal. 60 tablet 3  . Multiple Vitamin (MULTIVITAMIN WITH MINERALS) TABS tablet Take 1 tablet by mouth daily.    . potassium chloride SA (K-DUR,KLOR-CON) 20 MEQ tablet Take 1 tablet (20 mEq total) by mouth daily. 90 tablet 3  . rosuvastatin (CRESTOR) 20 MG tablet Take 2 tablets (40 mg total) by mouth daily. 180 tablet 3  . thiamine 100 MG tablet Take 1 tablet (100 mg total) by mouth daily. 30 tablet 0  . traZODone (DESYREL) 100 MG tablet Take 100 mg by mouth at bedtime.    Marland Kitchen diltiazem (CARDIZEM CD) 360 MG 24 hr capsule Take 1 capsule (360 mg total) by mouth daily. 90 capsule 3  . furosemide (LASIX) 40 MG tablet Take 1 tablet (40 mg total) by mouth daily. 90 tablet 3   No current facility-administered medications for this visit.     Allergies:   Prednisone; Benicar [olmesartan]; Celexa [citalopram]; Effexor [venlafaxine]; and Shrimp [shellfish allergy]   Social History:  The patient  reports that she has been smoking.  She has never used smokeless tobacco. She reports that she drinks about 3.6 oz of alcohol per week . She reports that she uses drugs, including "Crack" cocaine.   Family History:  The patient's  family history includes Asthma in her father; Cancer in her mother; Diabetes in her mother; Hyperlipidemia in her mother; Hypertension in her mother; Rheum arthritis in her father; Stroke in her father; Thyroid disease in her brother and father.    ROS:  Please see the history of present illness.   All other systems are personally reviewed and negative.    PHYSICAL EXAM: VS:  BP (!) 160/84   Pulse 92   Ht 5\' 5"  (1.651 m)   Wt 161 lb 9.6 oz (73.3 kg)   SpO2 99%   BMI 26.89 kg/m  , BMI Body mass index is 26.89 kg/m. GEN: Well nourished, well developed, in no acute distress    HEENT: normal  Neck: + JVD, nocarotid bruits, or masses Cardiac: RRR; no murmurs, rubs, or gallops,+1 edema  Respiratory:  Few basilar rales, normal work of breathing GI: soft, nontender, nondistended, + BS MS: no deformity or atrophy  Skin: warm and dry  Neuro:  Strength and sensation are intact Psych: euthymic mood, full affect  ILR is reviewed personally today as above  Recent Labs: 12/21/2015: ALT 16; B Natriuretic Peptide 32.6 09/21/2016: TSH 0.478 11/13/2016: BUN 15; Creatinine, Ser 1.18; Hemoglobin 11.7; Platelets 272; Potassium 3.1; Sodium 139  personally reviewed   Lipid Panel  Component Value Date/Time   CHOL 200 11/02/2015 0509   TRIG 183 (H) 11/02/2015 0509   HDL 36 (L) 11/02/2015 0509   CHOLHDL 5.6 11/02/2015 0509   VLDL 37 11/02/2015 0509   LDLCALC 127 (H) 11/02/2015 0509   personally reviewed   Wt Readings from Last 3 Encounters:  12/12/16 161 lb 9.6 oz (73.3 kg)  09/21/16 160 lb (72.6 kg)  11/18/15 160 lb (72.6 kg)    Other studies personally reviewed: Additional studies/ records that were reviewed today include: prior MRI, hospital records, prior echo  Review of the above records today demonstrates: as above   ASSESSMENT AND PLAN:  1.  CHF Previously EF was preserved Will repeat an echo at this time. 2 gram sodium diet Likely due to hypertensive cardiovascular disease Adequate bp control is advised bmet, bnp today She is volume overloaded today Will stop hctz and start lasix 40mg  daily  2. Hypertensive cardiovascular disease with CHf Elevated BP chronically Will stop short activing diltiazem and start diltiazem CD 360mg  daily today 2 gram sodium diet bmet today  3. SVT Switch short acting diltiazem to daily diltiazem Continue to monitor with ILR Check tfts today  4. 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  I have advised that she re-establish with neurology for long term  management. I will make referral to neurology today.  5. Prior stroke Continue to monitor for AF with ILR  6. Depression She has not yet established with primary care Per her request, I have given 30 day prescription for cybalta and lexapro.  She will need to establish with primary care for management going forward.  Follow-up with EP PA-C in 4 weeks and then as determined going forward based on above testing.  I will see when needed.  Current medicines are reviewed at length with the patient today.   The patient does not have concerns regarding her medicines.  The following changes were made today:  none  Labs/ tests ordered today include:  Orders Placed This Encounter  Procedures  . Basic Metabolic Panel (BMET)  . TSH  . T4  . Pro b natriuretic peptide  . Ambulatory referral to Neurology  . CUP PACEART [place]  . ECHOCARDIOGRAM COMPLETE   Very complicated patient with multiple acute issues requiring attention today.  A high level of decision making was required for this encounter.  SignedThompson Grayer, MD  12/12/2016   [address] [place] [address] [phone] (office) [phone] (fax)

## 2016-12-12 NOTE — Patient Instructions (Addendum)
Medication Instructions: - Your physician has recommended you make the following change in your medication:  1) Stop hydrochlorothiazide (HCTZ) 2) Start lasix (furosemide) 40 mg- take one tablet by mouth once daily 3) Stop diltiazem (short acting) 4) Start Cardizem CD (diltiazem) 360 mg- take one capsule by mouth once daily  Labwork: - Your physician recommends that you return for lab work today: BMP/ BNP/ TSH/ T4  Procedures/Testing: - Your physician has requested that you have an echocardiogram. Echocardiography is a painless test that uses sound waves to create images of your heart. It provides your doctor with information about the size and shape of your heart and how well your heart's chambers and valves are working. This procedure takes approximately one hour. There are no restrictions for this procedure.  Follow-Up: - Your physician recommends that you schedule a follow-up appointment in: 4 weeks with Dillon Bjork, PA for Dr. Rayann Heman.  - You are being referred back to Dr. Jaynee Eagles (neurology) - headaches  Any Additional Special Instructions Will Be Listed Below (If Applicable). - 2 gram sodium diet    If you need a refill on your cardiac medications before your next appointment, please call your pharmacy.

## 2016-12-13 ENCOUNTER — Telehealth: Payer: Self-pay

## 2016-12-13 LAB — BASIC METABOLIC PANEL
BUN/Creatinine Ratio: 13 (ref 9–23)
BUN: 15 mg/dL (ref 6–24)
CALCIUM: 9.4 mg/dL (ref 8.7–10.2)
CO2: 26 mmol/L (ref 18–29)
CREATININE: 1.14 mg/dL — AB (ref 0.57–1.00)
Chloride: 96 mmol/L (ref 96–106)
GFR calc Af Amer: 61 mL/min/{1.73_m2} (ref >59)
GFR, EST NON AFRICAN AMERICAN: 53 mL/min/{1.73_m2} — AB (ref >59)
Glucose: 198 mg/dL — ABNORMAL HIGH (ref 65–99)
POTASSIUM: 3.8 mmol/L (ref 3.5–5.2)
Sodium: 142 mmol/L (ref 134–144)

## 2016-12-13 LAB — TSH: TSH: 0.379 u[IU]/mL — ABNORMAL LOW (ref 0.450–4.500)

## 2016-12-13 LAB — T4: T4 TOTAL: 8.3 ug/dL (ref 4.5–12.0)

## 2016-12-13 LAB — PRO B NATRIURETIC PEPTIDE: NT-Pro BNP: 31 pg/mL (ref 0–287)

## 2016-12-13 NOTE — Telephone Encounter (Signed)
Lmtcb for results and recommendations 

## 2016-12-13 NOTE — Telephone Encounter (Signed)
Patient is aware and agreeable to results. Informed patient of elevated blood sugars as well as low TSH. She informed me that she gas a list that she received from the ED of PCP doctors to call. I encouraged her to call them this week and try to get established with someone. She said she would call this week and I asked her to call the office back by Friday and let us know if she gets an appointment scheduled with anyone so that we could forward the results. She was agreeable.

## 2016-12-15 ENCOUNTER — Other Ambulatory Visit: Payer: Self-pay

## 2016-12-15 ENCOUNTER — Ambulatory Visit (HOSPITAL_COMMUNITY): Payer: Medicaid Other | Attending: Cardiovascular Disease

## 2016-12-15 DIAGNOSIS — R0602 Shortness of breath: Secondary | ICD-10-CM | POA: Diagnosis not present

## 2016-12-22 ENCOUNTER — Telehealth: Payer: Self-pay

## 2016-12-22 NOTE — Telephone Encounter (Signed)
Patient is agreeable and aware of echo results. Also confirmed April appt with Marinus Maw with patient. She is agreeable to follow up and remote device check

## 2016-12-26 ENCOUNTER — Ambulatory Visit (INDEPENDENT_AMBULATORY_CARE_PROVIDER_SITE_OTHER): Payer: Medicaid Other | Admitting: *Deleted

## 2016-12-26 DIAGNOSIS — I639 Cerebral infarction, unspecified: Secondary | ICD-10-CM

## 2016-12-27 NOTE — Progress Notes (Signed)
[facility] Summary Report / Loop Recorder 

## 2016-12-29 LAB — CUP PACEART REMOTE DEVICE CHECK
Date Time Interrogation Session: 20180403041218
MDC IDC PG IMPLANT DT: 20161111

## 2017-01-02 ENCOUNTER — Encounter: Payer: Self-pay | Admitting: Family Medicine

## 2017-01-02 ENCOUNTER — Ambulatory Visit: Payer: Medicaid Other | Attending: Family Medicine | Admitting: Family Medicine

## 2017-01-02 VITALS — BP 160/70 | HR 98 | Temp 98.0°F | Ht 65.0 in | Wt 162.6 lb

## 2017-01-02 DIAGNOSIS — Z79899 Other long term (current) drug therapy: Secondary | ICD-10-CM | POA: Insufficient documentation

## 2017-01-02 DIAGNOSIS — I10 Essential (primary) hypertension: Secondary | ICD-10-CM

## 2017-01-02 DIAGNOSIS — J4520 Mild intermittent asthma, uncomplicated: Secondary | ICD-10-CM | POA: Diagnosis not present

## 2017-01-02 DIAGNOSIS — Z794 Long term (current) use of insulin: Secondary | ICD-10-CM

## 2017-01-02 DIAGNOSIS — I5032 Chronic diastolic (congestive) heart failure: Secondary | ICD-10-CM | POA: Diagnosis not present

## 2017-01-02 DIAGNOSIS — E1151 Type 2 diabetes mellitus with 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 gangrene: Secondary | ICD-10-CM | POA: Diagnosis not present

## 2017-01-02 DIAGNOSIS — F419 Anxiety disorder, unspecified: Secondary | ICD-10-CM | POA: Insufficient documentation

## 2017-01-02 DIAGNOSIS — Z8673 Personal history of transient ischemic attack (TIA), and cerebral infarction without residual deficits: Secondary | ICD-10-CM | POA: Diagnosis not present

## 2017-01-02 DIAGNOSIS — E119 Type 2 diabetes mellitus without complications: Secondary | ICD-10-CM | POA: Diagnosis present

## 2017-01-02 DIAGNOSIS — Z7982 Long term (current) use of aspirin: Secondary | ICD-10-CM | POA: Diagnosis not present

## 2017-01-02 DIAGNOSIS — G4709 Other insomnia: Secondary | ICD-10-CM

## 2017-01-02 DIAGNOSIS — G4700 Insomnia, unspecified: Secondary | ICD-10-CM | POA: Diagnosis not present

## 2017-01-02 DIAGNOSIS — Z1211 Encounter for screening for malignant neoplasm of colon: Secondary | ICD-10-CM | POA: Diagnosis not present

## 2017-01-02 DIAGNOSIS — E785 Hyperlipidemia, unspecified: Secondary | ICD-10-CM | POA: Diagnosis not present

## 2017-01-02 DIAGNOSIS — I110 Hypertensive heart disease with heart failure: Secondary | ICD-10-CM | POA: Diagnosis not present

## 2017-01-02 DIAGNOSIS — R946 Abnormal results of thyroid function studies: Secondary | ICD-10-CM

## 2017-01-02 DIAGNOSIS — R7989 Other specified abnormal findings of blood chemistry: Secondary | ICD-10-CM | POA: Insufficient documentation

## 2017-01-02 LAB — POCT GLYCOSYLATED HEMOGLOBIN (HGB A1C): Hemoglobin A1C: 8.6

## 2017-01-02 LAB — GLUCOSE, POCT (MANUAL RESULT ENTRY): POC Glucose: 250 mg/dl — AB (ref 70–99)

## 2017-01-02 MED ORDER — INSULIN ASPART 100 UNIT/ML ~~LOC~~ SOLN: SUBCUTANEOUS | 12 refills | Status: DC

## 2017-01-02 MED ORDER — DULOXETINE HCL 60 MG PO CPEP: 60.0000 mg | ORAL_CAPSULE | Freq: Every day | ORAL | 3 refills | Status: DC

## 2017-01-02 MED ORDER — METFORMIN HCL 1000 MG PO TABS: 1000.0000 mg | ORAL_TABLET | Freq: Two times a day (BID) | ORAL | 3 refills | Status: DC

## 2017-01-02 MED ORDER — INSULIN GLARGINE 100 UNIT/ML ~~LOC~~ SOLN: 15.0000 [IU] | Freq: Every day | SUBCUTANEOUS | 3 refills | Status: DC

## 2017-01-02 MED ORDER — TRAZODONE HCL 150 MG PO TABS: 150.0000 mg | ORAL_TABLET | Freq: Every day | ORAL | 3 refills | Status: DC

## 2017-01-02 MED ORDER — GLIPIZIDE 5 MG PO TABS: 5.0000 mg | ORAL_TABLET | Freq: Two times a day (BID) | ORAL | 3 refills | Status: DC

## 2017-01-02 MED ORDER — ALBUTEROL SULFATE HFA 108 (90 BASE) MCG/ACT IN AERS: 2.0000 | INHALATION_SPRAY | Freq: Four times a day (QID) | RESPIRATORY_TRACT | 3 refills | Status: DC | PRN

## 2017-01-02 NOTE — Progress Notes (Signed)
Subjective:  Patient ID: Lisa Anderson, female    DOB: Oct 11, 1955  Age: 61 y.o. MRN: [id number]  CC: low thryroid tests; Diabetes; Hypertension; and referral for colonoscopy   HPI Lisa Anderson is 61 year old female with a history of type 2 diabetes mellitus, hypertension, depression, Diastolic CHF (EF 60 is to 65% on 2-D echo 11/2016) right parieto occipital CVA in 07/2015 who is here for a follow-up visit.  She has been noncompliant with her medications as she moved out of [place] to her home town but then decided to return back to [place] where she has secured a job.  Her last visit to EP Cardiology was last month where she had refills of her antihypertensives and loop recorder check which revealed narrow complex SVT but no arrhythmias; short-acting Diltiazem was changed to long-acting. She had blood work which revealed a suppressed TSH and she was advised to follow-up with her PCP.  Of note the patient endorses swelling of the right side of her neck which increases with weight gain but denies history of goiter or thyroid problems. She is also concerned that she has been unable to gain weight.  Complains of insomnia despite taking Prilosec; she is only able to sleep 2-3 hours a night.  Would like a referral for colonoscopy.  Past Medical History:  Diagnosis Date  . Asthma   . Cancer ([place])    breast  . Chronic congestive heart failure with left ventricular diastolic dysfunction ([place])   . Diabetes mellitus   . Hyperlipidemia   . Hypertension   . Stroke [facility])     Past Surgical History:  Procedure Laterality Date  . BREAST LUMPECTOMY    . EP IMPLANTABLE DEVICE N/A 08/07/2015   Procedure: Loop Recorder Insertion;  Surgeon: Thompson Grayer, MD;  Location: [place] CV LAB;  Service: Cardiovascular;  Laterality: N/A;  . TEE WITHOUT CARDIOVERSION N/A 08/07/2015   Procedure: TRANSESOPHAGEAL ECHOCARDIOGRAM (TEE);  Surgeon: Sueanne Margarita, MD;  Location: [facility] ENDOSCOPY;  Service:  Cardiovascular;  Laterality: N/A;  . TONSILLECTOMY       Outpatient Medications Prior to Visit  Medication Sig Dispense Refill  . aspirin EC 325 MG tablet Take 325 mg by mouth daily.    . beclomethasone (QVAR) 80 MCG/ACT inhaler Inhale 1 puff into the lungs 2 (two) times daily. Rinse mouth with water after use to reduce aftertaste and incidence of candidiasis. Do not swallow.    . clopidogrel (PLAVIX) 75 MG tablet Take 1 tablet (75 mg total) by mouth daily. 90 tablet 3  . diltiazem (CARDIZEM CD) 360 MG 24 hr capsule Take 1 capsule (360 mg total) by mouth daily. 90 capsule 3  . escitalopram (LEXAPRO) 20 MG tablet Take 1 tablet (20 mg total) by mouth daily. 30 tablet 0  . furosemide (LASIX) 40 MG tablet Take 1 tablet (40 mg total) by mouth daily. 90 tablet 3  . hydrALAZINE (APRESOLINE) 50 MG tablet Take 1 tablet (50 mg total) by mouth 3 (three) times daily. 270 tablet 3  . potassium chloride SA (K-DUR,KLOR-CON) 20 MEQ tablet Take 1 tablet (20 mEq total) by mouth daily. 90 tablet 3  . rosuvastatin (CRESTOR) 20 MG tablet Take 2 tablets (40 mg total) by mouth daily. 180 tablet 3  . albuterol (PROVENTIL HFA;VENTOLIN HFA) 108 (90 Base) MCG/ACT inhaler Inhale 2 puffs into the lungs every 6 (six) hours as needed for wheezing or shortness of breath. Shortness of breath 1 Inhaler 0  . DULoxetine (CYMBALTA) 30 MG capsule Take 1  capsule (30 mg total) by mouth daily. 30 capsule 0  . insulin aspart (NOVOLOG) 100 UNIT/ML injection Sliding scale CBG 70 - 120: 0 units CBG 121 - 150: 1 unit,  CBG 151 - 200: 2 units,  CBG 201 - 250: 3 units,  CBG 251 - 300: 5 units,  CBG 301 - 350: 7 units,  CBG 351 - 400: 9 units   CBG > 400: 9 units and notify your MD 1 vial 12  . traZODone (DESYREL) 100 MG tablet Take 100 mg by mouth at bedtime.    . folic acid (FOLVITE) 1 MG tablet Take 1 tablet (1 mg total) by mouth daily. (Patient not taking: Reported on 01/02/2017)    . Multiple Vitamin (MULTIVITAMIN WITH MINERALS) TABS tablet  Take 1 tablet by mouth daily. (Patient not taking: Reported on 01/02/2017)    . thiamine 100 MG tablet Take 1 tablet (100 mg total) by mouth daily. (Patient not taking: Reported on 01/02/2017) 30 tablet 0  . benzonatate (TESSALON) 100 MG capsule Take 1 capsule (100 mg total) by mouth 3 (three) times daily as needed for cough. (Patient not taking: Reported on 01/02/2017) 20 capsule 0  . glipiZIDE (GLUCOTROL) 5 MG tablet Take 1 tablet (5 mg total) by mouth 2 (two) times daily before a meal. (Patient not taking: Reported on 01/02/2017) 60 tablet 3  . insulin glargine (LANTUS) 100 UNIT/ML injection Inject 15 Units into the skin at bedtime.    . metFORMIN (GLUCOPHAGE) 1000 MG tablet Take 1 tablet (1,000 mg total) by mouth 2 (two) times daily with a meal. (Patient not taking: Reported on 01/02/2017) 60 tablet 3   No facility-administered medications prior to visit.     ROS Review of Systems  Constitutional: Negative for activity change, appetite change and fatigue.  HENT: Negative for congestion, sinus pressure and sore throat.   Eyes: Negative for visual disturbance.  Respiratory: Negative for cough, chest tightness, shortness of breath and wheezing.   Cardiovascular: Negative for chest pain and palpitations.  Gastrointestinal: Negative for abdominal distention, abdominal pain and constipation.  Endocrine: Negative for polydipsia.  Genitourinary: Negative for dysuria and frequency.  Musculoskeletal: Negative for arthralgias and back pain.  Skin: Negative for rash.  Neurological: Negative for tremors, light-headedness and numbness.  Hematological: Does not bruise/bleed easily.  Psychiatric/Behavioral: Positive for sleep disturbance. Negative for agitation and behavioral problems.    Objective:  BP (!) 160/70 (BP Location: Right Arm, Patient Position: Sitting, Cuff Size: Small)   Pulse 98   Temp 98 F (36.7 C) (Oral)   Ht 5\' 5"  (1.651 m)   Wt 162 lb 9.6 oz (73.8 kg)   SpO2 98%   BMI 27.06 kg/m    BP/Weight 01/02/2017 12/12/2016 4/69/6295  Systolic BP 284 132 440  Diastolic BP 70 84 73  Wt. (Lbs) 162.6 161.6 -  BMI 27.06 26.89 -  Some encounter information is confidential and restricted. Go to Review Flowsheets activity to see all data.      Physical Exam  Constitutional: She is oriented to person, place, and time. She appears well-developed and well-nourished.  Neck:  Soft tissue swelling more pronounced on the right  Cardiovascular: Normal rate, normal heart sounds and intact distal pulses.   No murmur heard. Pulmonary/Chest: Effort normal and breath sounds normal. She has no wheezes. She has no rales. She exhibits no tenderness.  Abdominal: Soft. Bowel sounds are normal. She exhibits no distension and no mass. There is no tenderness.  Musculoskeletal: Normal range of  motion.  Neurological: She is alert and oriented to person, place, and time.  Skin: Skin is warm and dry.  Psychiatric: She has a normal mood and affect.    Lab Results  Component Value Date   HGBA1C 8.6 01/02/2017    Assessment & Plan:   1. Type 2 diabetes mellitus with 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 gangrene, with long-term current use of insulin (HCC) Uncontrolled with A1c of 8.6 Likely due to noncompliance Stressed importance of compliance ADA diet - Glucose (CBG) - HgB A1c - metFORMIN (GLUCOPHAGE) 1000 MG tablet; Take 1 tablet (1,000 mg total) by mouth 2 (two) times daily with a meal.  Dispense: 60 tablet; Refill: 3 - insulin glargine (LANTUS) 100 UNIT/ML injection; Inject 0.15 mLs (15 Units total) into the skin at bedtime.  Dispense: 10 mL; Refill: 3 - insulin aspart (NOVOLOG) 100 UNIT/ML injection; Sliding scale CBG 70 - 120: 0 units CBG 121 - 150: 1 unit,  CBG 151 - 200: 2 units,  CBG 201 - 250: 3 units,  CBG 251 - 300: 5 units,  CBG 301 - 350: 7 units,  CBG 351 - 400: 9 units   CBG > 400: 9 units and notify your MD  Dispense: 1 vial; Refill: 12 - glipiZIDE (GLUCOTROL) 5 MG tablet; Take  1 tablet (5 mg total) by mouth 2 (two) times daily before a meal.  Dispense: 60 tablet; Refill: 3 - Ambulatory referral to Ophthalmology  2. Abnormal thyroid blood test Suppressed TSH, repeat TSH and if still suppressed will commenced medication Will also need neck ultrasound due to soft tissue swelling on the right in the near future - TSH  3. Other insomnia Uncontrolled Raised trazodone dose - traZODone (DESYREL) 150 MG tablet; Take 1 tablet (150 mg total) by mouth at bedtime.  Dispense: 30 tablet; Refill: 3  4. Essential hypertension Uncontrolled Low-sodium diet and We'll reassess at next visit and adjust regimen if still elevated  5. Chronic diastolic CHF (congestive heart failure) (HCC) Euvolemic EF 60-65% from 2-D echo 11/2016 Continue medications  6. Mild intermittent asthma without complication No exacerbation  7. Anxiety Currently on Lexapro and Cymbalta I have increased the dose of Cymbalta which also helps with her pain She has been instructed on tapering herself off Lexapro as she is on 2 SSRIs  8. Screening for colon cancer - Ambulatory referral to Gastroenterology   Meds ordered this encounter  Medications  . albuterol (PROVENTIL HFA;VENTOLIN HFA) 108 (90 Base) MCG/ACT inhaler    Sig: Inhale 2 puffs into the lungs every 6 (six) hours as needed for wheezing or shortness of breath. Shortness of breath    Dispense:  1 Inhaler    Refill:  3  . traZODone (DESYREL) 150 MG tablet    Sig: Take 1 tablet (150 mg total) by mouth at bedtime.    Dispense:  30 tablet    Refill:  3  . metFORMIN (GLUCOPHAGE) 1000 MG tablet    Sig: Take 1 tablet (1,000 mg total) by mouth 2 (two) times daily with a meal.    Dispense:  60 tablet    Refill:  3  . insulin glargine (LANTUS) 100 UNIT/ML injection    Sig: Inject 0.15 mLs (15 Units total) into the skin at bedtime.    Dispense:  10 mL    Refill:  3  . insulin aspart (NOVOLOG) 100 UNIT/ML injection    Sig: Sliding scale CBG 70  - 120: 0 units CBG 121 - 150: 1 unit,  CBG 151 - 200: 2 units,  CBG 201 - 250: 3 units,  CBG 251 - 300: 5 units,  CBG 301 - 350: 7 units,  CBG 351 - 400: 9 units   CBG > 400: 9 units and notify your MD    Dispense:  1 vial    Refill:  12  . glipiZIDE (GLUCOTROL) 5 MG tablet    Sig: Take 1 tablet (5 mg total) by mouth 2 (two) times daily before a meal.    Dispense:  60 tablet    Refill:  3  . DULoxetine (CYMBALTA) 60 MG capsule    Sig: Take 1 capsule (60 mg total) by mouth daily.    Dispense:  60 capsule    Refill:  3    Follow-up: Return in about 1 month (around 02/01/2017) for complete physical exam.   Arnoldo Morale MD

## 2017-01-02 NOTE — Progress Notes (Signed)
Need metformin, glipizide, albuterol inhaler, plavix, lexapro, cymbalta, folic acid, novolog  Has only been taking "old" novolog- has been out of all other diabetes meds for over 2 weeks  A "friend" has been giving her metformin

## 2017-01-03 ENCOUNTER — Encounter: Payer: Self-pay | Admitting: Family Medicine

## 2017-01-03 LAB — TSH: TSH: 0.68 u[IU]/mL (ref 0.450–4.500)

## 2017-01-04 ENCOUNTER — Telehealth: Payer: Self-pay | Admitting: Family Medicine

## 2017-01-04 MED ORDER — CLOPIDOGREL BISULFATE 75 MG PO TABS: 75.0000 mg | ORAL_TABLET | Freq: Every day | ORAL | 3 refills | Status: DC

## 2017-01-04 NOTE — Telephone Encounter (Signed)
Patient called the office to request medication refill for clopidogrel (PLAVIX) 75 MG tablet. Please send it to [facility] on [address].  Thank you.

## 2017-01-04 NOTE — Telephone Encounter (Signed)
Clopidogrel refilled

## 2017-01-05 NOTE — Progress Notes (Deleted)
Cardiology Office Note Date:  01/05/2017  Patient ID:  Lisa Anderson, DOB 21-Jun-1956, MRN [id number] PCP:  Arnoldo Morale, MD  Cardiologist:  ***  ***refresh   Chief Complaint: ***  History of Present Illness: Lisa Anderson is a 61 y.o. female with history of cryptogenic stroke and ILR, HTN, hypertensive heart disease, DM, HLD, breast cancer (lumpectomy), cerebral aneurysm, depression, comes into the office today to be seen for Dr. Rayann Heman.  She was last seen by him last month after having been lost to f/u having moved, now back in [place].    At her last visit she was noted to be fluid overloaded, narrow/SVT noted on her ILR without AFib and her short acting as needed diltiazem and changed to long acting daily regime, her HCTZ was stopped and lasix started.  BMET looked, OK as did her TSH, echo noted LVEF 60-65%, with grade I DD, no VHD.   *** fluid status *** symptoms *** ILR with change of dilt *** BP *** PMD for meds *** neurology? *** meds  Device information: MDT ILR implanted 08/07/15, cryptogenic stroke   Past Medical History:  Diagnosis Date  . Asthma   . Cancer ([place])    breast  . Chronic congestive heart failure with left ventricular diastolic dysfunction ([place])   . Diabetes mellitus   . Hyperlipidemia   . Hypertension   . Stroke [facility])     Past Surgical History:  Procedure Laterality Date  . BREAST LUMPECTOMY    . EP IMPLANTABLE DEVICE N/A 08/07/2015   Procedure: Loop Recorder Insertion;  Surgeon: Thompson Grayer, MD;  Location: [place] [facility];  Service: Cardiovascular;  Laterality: N/A;  . TEE WITHOUT CARDIOVERSION N/A 08/07/2015   Procedure: TRANSESOPHAGEAL ECHOCARDIOGRAM (TEE);  Surgeon: Sueanne Margarita, MD;  Location: [facility] ENDOSCOPY;  Service: Cardiovascular;  Laterality: N/A;  . TONSILLECTOMY      Current Outpatient Prescriptions  Medication Sig Dispense Refill  . albuterol (PROVENTIL HFA;VENTOLIN HFA) 108 (90 Base) MCG/ACT inhaler Inhale 2 puffs  into the lungs every 6 (six) hours as needed for wheezing or shortness of breath. Shortness of breath 1 Inhaler 3  . aspirin EC 325 MG tablet Take 325 mg by mouth daily.    . beclomethasone (QVAR) 80 MCG/ACT inhaler Inhale 1 puff into the lungs 2 (two) times daily. Rinse mouth with water after use to reduce aftertaste and incidence of candidiasis. Do not swallow.    . clopidogrel (PLAVIX) 75 MG tablet Take 1 tablet (75 mg total) by mouth daily. 90 tablet 3  . diltiazem (CARDIZEM CD) 360 MG 24 hr capsule Take 1 capsule (360 mg total) by mouth daily. 90 capsule 3  . DULoxetine (CYMBALTA) 60 MG capsule Take 1 capsule (60 mg total) by mouth daily. 60 capsule 3  . escitalopram (LEXAPRO) 20 MG tablet Take 1 tablet (20 mg total) by mouth daily. 30 tablet 0  . folic acid (FOLVITE) 1 MG tablet Take 1 tablet (1 mg total) by mouth daily. (Patient not taking: Reported on 01/02/2017)    . furosemide (LASIX) 40 MG tablet Take 1 tablet (40 mg total) by mouth daily. 90 tablet 3  . glipiZIDE (GLUCOTROL) 5 MG tablet Take 1 tablet (5 mg total) by mouth 2 (two) times daily before a meal. 60 tablet 3  . hydrALAZINE (APRESOLINE) 50 MG tablet Take 1 tablet (50 mg total) by mouth 3 (three) times daily. 270 tablet 3  . insulin aspart (NOVOLOG) 100 UNIT/ML injection Sliding scale CBG 70 - 120:  0 units CBG 121 - 150: 1 unit,  CBG 151 - 200: 2 units,  CBG 201 - 250: 3 units,  CBG 251 - 300: 5 units,  CBG 301 - 350: 7 units,  CBG 351 - 400: 9 units   CBG > 400: 9 units and notify your MD 1 vial 12  . insulin glargine (LANTUS) 100 UNIT/ML injection Inject 0.15 mLs (15 Units total) into the skin at bedtime. 10 mL 3  . metFORMIN (GLUCOPHAGE) 1000 MG tablet Take 1 tablet (1,000 mg total) by mouth 2 (two) times daily with a meal. 60 tablet 3  . Multiple Vitamin (MULTIVITAMIN WITH MINERALS) TABS tablet Take 1 tablet by mouth daily. (Patient not taking: Reported on 01/02/2017)    . potassium chloride SA (K-DUR,KLOR-CON) 20 MEQ tablet Take 1  tablet (20 mEq total) by mouth daily. 90 tablet 3  . rosuvastatin (CRESTOR) 20 MG tablet Take 2 tablets (40 mg total) by mouth daily. 180 tablet 3  . thiamine 100 MG tablet Take 1 tablet (100 mg total) by mouth daily. (Patient not taking: Reported on 01/02/2017) 30 tablet 0  . traZODone (DESYREL) 150 MG tablet Take 1 tablet (150 mg total) by mouth at bedtime. 30 tablet 3   No current facility-administered medications for this visit.     Allergies:   Prednisone; Benicar [olmesartan]; Celexa [citalopram]; Effexor [venlafaxine]; and Shrimp [shellfish allergy]   Social History:  The patient  reports that she has been smoking.  She has been smoking about 0.50 packs per day. She has never used smokeless tobacco. She reports that she drinks about 3.6 oz of alcohol per week . She reports that she uses drugs, including "Crack" cocaine.   Family History:  The patient's family history includes Asthma in her father; Cancer in her mother; Diabetes in her mother; Hyperlipidemia in her mother; Hypertension in her mother; Rheum arthritis in her father; Stroke in her father; Thyroid disease in her brother and father.***  ROS:  Please see the history of present illness. Otherwise, review of systems is positive for ***.   All other systems are reviewed and otherwise negative.   PHYSICAL EXAM: *** VS:  There were no vitals taken for this visit. BMI: There is no height or weight on file to calculate BMI. Well nourished, well developed, in no acute distress  HEENT: normocephalic, atraumatic  Neck: no JVD, carotid bruits or masses Cardiac:  ***RRR; no significant murmurs, no rubs, or gallops Lungs:  *** CTA b/l, no wheezing, rhonchi or rales  Abd: soft, nontender MS: no deformity or atrophy Ext: *** no edema  Skin: warm and dry, no rash Neuro:  No gross deficits appreciated Psych: euthymic mood, full affect  *** ILR site is stable, no tethering or discomfort   EKG:  Done today shows ***  12/15/16:  TTE Study Conclusions - Left ventricle: The cavity size was normal. Wall thickness was   normal. Systolic function was normal. The estimated ejection   fraction was in the range of 60% to 65%. Wall motion was normal;   there were no regional wall motion abnormalities. Doppler   parameters are consistent with abnormal left ventricular   relaxation (grade 1 diastolic dysfunction).  Recent Labs: 11/13/2016: Hemoglobin 11.7; Platelets 272 12/12/2016: BUN 15; Creatinine, Ser 1.14; NT-Pro BNP 31; Potassium 3.8; Sodium 142 01/02/2017: TSH 0.680  No results found for requested labs within last 8760 hours.   CrCl cannot be calculated (Patient's most recent lab result is older than the maximum  21 days allowed.).   Wt Readings from Last 3 Encounters:  01/02/17 162 lb 9.6 oz (73.8 kg)  12/12/16 161 lb 9.6 oz (73.3 kg)  09/21/16 160 lb (72.6 kg)     Other studies reviewed: Additional studies/records reviewed today include: summarized above***  ASSESSMENT AND PLAN:  1.  CHF      ***      *** counseled on sodium restriction, importance of BP control       2. Hypertensive cardiovascular disease with CHf     ***   3. SVT Now on long acting daily diltiazem *** ILR  TSH wnl    4. 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        ***  5. Prior stroke Continue to monitor for AF with ILR  6. Depression Dr Rayann Heman provided her with a 30 day prescription for cybalta and lexapro.   She will need to establish with primary care for management going forward.   Disposition: F/u with ***  Current medicines are reviewed at length with the patient today.  The patient did not have any concerns regarding medicines.***  Signed, Jennings Books, PA-C 01/05/2017 9:12 PM     [address] [place] [address] [phone] (office)  [phone] (fax)

## 2017-01-06 ENCOUNTER — Encounter: Payer: Medicaid Other | Admitting: Physician Assistant

## 2017-01-09 ENCOUNTER — Encounter: Payer: Self-pay | Admitting: Physician Assistant

## 2017-01-11 ENCOUNTER — Encounter: Payer: Self-pay | Admitting: *Deleted

## 2017-01-11 ENCOUNTER — Ambulatory Visit (INDEPENDENT_AMBULATORY_CARE_PROVIDER_SITE_OTHER): Payer: Medicaid Other | Admitting: Neurology

## 2017-01-11 ENCOUNTER — Encounter: Payer: Self-pay | Admitting: Neurology

## 2017-01-11 VITALS — BP 131/72 | HR 87 | Ht 65.0 in | Wt 157.6 lb

## 2017-01-11 DIAGNOSIS — I639 Cerebral infarction, unspecified: Secondary | ICD-10-CM | POA: Diagnosis not present

## 2017-01-11 DIAGNOSIS — I671 Cerebral aneurysm, nonruptured: Secondary | ICD-10-CM

## 2017-01-11 NOTE — Progress Notes (Signed)
[id number]    Provider:  Dr Jaynee Eagles Referring Provider: Arnoldo Morale, MD Primary Care Physician:  Arnoldo Morale, MD  CC:  stroke  Interval history 01/11/2017: She is still on Plavix and ASA. She is still smoking. She had a crack cocaine relapse recently. She moved back here, she has a job now. She had a recent visit to the ED for headache in the setting of high blood pressure after she was taken off of HTN meds, she feels better with blood pressure management with Dr. Rayann Heman after being placed on lasix. The headaches are resolved.    HPI:  Lisa Anderson is a 61 y.o. 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 [name] is a 61 y.o. female with history of hypertension, hyperlipidemia, asthma, diabetes mellitus, presented initially to [facility] ED with 5 day history of diplopia, moderate to severe right-sided headache and gait instability. She did not receive IV t-PA due to delay in arrival.  Interval History 09/16/2015: She is getting better. Symptoms are improving. Headache is mostly gone but she still is having some pressure maybe due to sinus pain. Double vision resolved, walking is better, no weakness. No more wavy lines. She has cut back on smoking and is trying to quit. No altered mentation or seizure activity. She goes to the cardiologist tomorrow to see about the loop implantor.She is on crestor. Taking ASA 325 every day. Blood pressure is a little high today, she endorses compliance with her medications. I offered to increase her meds, she says that she is seeing Dr. Joylene Grapes tomorrow, she is going to speak with him about that as he manages her blood pressure.   Reviewed notes, labs and imaging from outside physicians, which showed:  IMAGING  Ct Head Wo Contrast 08/05/2015 1. Area of cortical low-attenuation the right parietal region, suspicious for evolving cortically based infarct. This  could be confirmed with MRI of the brain with and without IV gadolinium. 2. Very mild chronic microvascular ischemic changes in the cerebral white matter, as above. 3. Paranasal sinus disease, as above, including a small air-fluid level in the right maxillary sinus, which could indicate acute sinusitis.   Ct Angio Neck W/cm &/or Wo/cm 08/06/2015 No significant carotid or vertebral artery stenosis in the neck.   Mr Lisa Anderson Head Wo Contrast 08/05/2015 1. Segmental occlusion or near complete occlusion of the proximal right M2 inferior division. 2. Mild right M1 stenosis. 3. Moderate distal right vertebral artery stenosis. 4. 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   Mr Brain Wo Contrast 08/05/2015 There is mild right parieto-occipital cortical cytotoxic edema related to the acute infarct. No intracranial hemorrhage, mass, midline shift, or extra-axial fluid collection is seen. Small foci of T2 hyperintensity in the subcortical and deep cerebral white matter bilaterally and in the pons are nonspecific but compatible with mild chronic small vessel ischemic disease. Orbits are unremarkable. There is mild right maxillary sinus mucosal thickening with small volume fluid/secretions. Polypoid mucosal thickening is noted in the left sphenoid and left maxillary sinuses, and there is mild bilateral ethmoid air cell mucosal thickening. Trace bilateral mastoid effusions are noted. Major intracranial vascular flow voids are preserved.   2D Echocardiogram  - Left ventricle: The cavity size was normal. Wall thickness wasnormal. Systolic function was normal. The estimated ejectionfraction was in the range of 55% to 60%. Wall motion was normal;there were no regional wall motion abnormalities. Dopplerparameters are consistent with abnormal left  ventricularrelaxation (grade 1 diastolic dysfunction). - Left atrium: The atrium was mildly dilated. - Atrial septum: No defect or patent foramen ovale  was identified.  EEG - This is an abnormal electroencephalogram due to sharp activity noted in the left temporal region. This is suggestive of a focal disturbance with epileptogenic potential.  TEE No obvious source of embolism. Normal LV size and function Normal RV size and function Normal RA Normal LA and LA appendage Normal TV with trivial TR Normal PV Normal MV with trivial MR Normal trileaflet AV Normal interatrial septum with no evidence of shunt by colorflow dopper or agitated saline contrast Normal thoracic and ascending aorta.  Review of Systems: Patient complains of symptoms per HPI as well as the following symptoms: no CP, no SOB. Pertinent negatives per HPI. All others negative.   Social History   Social History  . Marital status: Single    Spouse name: N/A  . Number of children: 5  . Years of education: 12   Occupational History  . CNA- [employer]    Social History Main Topics  . Smoking status: Current Every Day Smoker    Packs/day: 0.50  . Smokeless tobacco: Never Used     Comment: none in the past 2 days  . Alcohol use 3.6 oz/week    6 Cans of beer per week     Comment: last time in September 2017  . Drug use: Yes    Types: "Crack" cocaine     Comment: last time September 2017  . Sexual activity: No     Comment: recovering cocaine addict x6 months   Other Topics Concern  . Not on file   Social History Narrative   Lives with daughter   Caffeine use: Drinks soda (2- 20 oz soda per day)    Family History  Problem Relation Age of Onset  . Cancer Mother   . Diabetes Mother   . Hyperlipidemia Mother   . Hypertension Mother   . Rheum arthritis Father   . Asthma Father   . Stroke Father   . Thyroid disease Father   . Thyroid disease Brother     Past Medical History:  Diagnosis Date  . Asthma   . Cancer ([place])    breast  . Chronic congestive heart failure with left ventricular diastolic dysfunction ([place])   . Diabetes  mellitus   . Hyperlipidemia   . Hypertension   . Stroke [facility])     Past Surgical History:  Procedure Laterality Date  . BREAST LUMPECTOMY    . EP IMPLANTABLE DEVICE N/A 08/07/2015   Procedure: Loop Recorder Insertion;  Surgeon: Thompson Grayer, MD;  Location: [place] [facility];  Service: Cardiovascular;  Laterality: N/A;  . TEE WITHOUT CARDIOVERSION N/A 08/07/2015   Procedure: TRANSESOPHAGEAL ECHOCARDIOGRAM (TEE);  Surgeon: Sueanne Margarita, MD;  Location: [facility] ENDOSCOPY;  Service: Cardiovascular;  Laterality: N/A;  . TONSILLECTOMY      Current Outpatient Prescriptions  Medication Sig Dispense Refill  . albuterol (PROVENTIL HFA;VENTOLIN HFA) 108 (90 Base) MCG/ACT inhaler Inhale 2 puffs into the lungs every 6 (six) hours as needed for wheezing or shortness of breath. Shortness of breath 1 Inhaler 3  . aspirin EC 325 MG tablet Take 325 mg by mouth daily.    . beclomethasone (QVAR) 80 MCG/ACT inhaler Inhale 1 puff into the lungs 2 (two) times daily. Rinse mouth with water after use to reduce aftertaste and incidence of candidiasis. Do not swallow.    . clopidogrel (  PLAVIX) 75 MG tablet Take 1 tablet (75 mg total) by mouth daily. 90 tablet 3  . diltiazem (CARDIZEM CD) 360 MG 24 hr capsule Take 1 capsule (360 mg total) by mouth daily. 90 capsule 3  . DULoxetine (CYMBALTA) 60 MG capsule Take 1 capsule (60 mg total) by mouth daily. 60 capsule 3  . escitalopram (LEXAPRO) 20 MG tablet Take 1 tablet (20 mg total) by mouth daily. 30 tablet 0  . folic acid (FOLVITE) 1 MG tablet Take 1 tablet (1 mg total) by mouth daily.    . furosemide (LASIX) 40 MG tablet Take 1 tablet (40 mg total) by mouth daily. 90 tablet 3  . glipiZIDE (GLUCOTROL) 5 MG tablet Take 1 tablet (5 mg total) by mouth 2 (two) times daily before a meal. 60 tablet 3  . hydrALAZINE (APRESOLINE) 50 MG tablet Take 1 tablet (50 mg total) by mouth 3 (three) times daily. 270 tablet 3  . insulin aspart (NOVOLOG) 100 UNIT/ML injection Sliding scale  CBG 70 - 120: 0 units CBG 121 - 150: 1 unit,  CBG 151 - 200: 2 units,  CBG 201 - 250: 3 units,  CBG 251 - 300: 5 units,  CBG 301 - 350: 7 units,  CBG 351 - 400: 9 units   CBG > 400: 9 units and notify your MD 1 vial 12  . insulin glargine (LANTUS) 100 UNIT/ML injection Inject 0.15 mLs (15 Units total) into the skin at bedtime. 10 mL 3  . metFORMIN (GLUCOPHAGE) 1000 MG tablet Take 1 tablet (1,000 mg total) by mouth 2 (two) times daily with a meal. 60 tablet 3  . Multiple Vitamin (MULTIVITAMIN WITH MINERALS) TABS tablet Take 1 tablet by mouth daily.    . potassium chloride SA (K-DUR,KLOR-CON) 20 MEQ tablet Take 1 tablet (20 mEq total) by mouth daily. 90 tablet 3  . rosuvastatin (CRESTOR) 20 MG tablet Take 2 tablets (40 mg total) by mouth daily. 180 tablet 3  . traZODone (DESYREL) 150 MG tablet Take 1 tablet (150 mg total) by mouth at bedtime. 30 tablet 3   No current facility-administered medications for this visit.     Allergies as of 01/11/2017 - Review Complete 01/11/2017  Allergen Reaction Noted  . Prednisone Hives and Other (See Comments) 08/05/2015  . Benicar [olmesartan] Other (See Comments) 08/06/2015  . Celexa [citalopram] Other (See Comments) 08/06/2015  . Effexor [venlafaxine] Other (See Comments) 08/06/2015  . Shrimp [shellfish allergy] Hives and Swelling 10/08/2012    Vitals: BP 131/72   Pulse 87   Ht 5\' 5"  (1.651 m)   Wt 157 lb 9.6 oz (71.5 kg)   BMI 26.23 kg/m  Last Weight:  Wt Readings from Last 1 Encounters:  01/11/17 157 lb 9.6 oz (71.5 kg)   Last Height:   Ht Readings from Last 1 Encounters:  01/11/17 5\' 5"  (1.651 m)     ASSESSMENT/PLAN [name] is a 61 y.o. female with history of hypertension, hyperlipidemia, asthma, diabetes mellitus, presented initially to [facility] ED with 5 day history of diplopia, moderate to severe right-sided headache and gait instability. She did not receive IV t-PA due to delay in arrival.   Stroke: R  parieto-occipital cortical infarct embolic secondary to unknown source  Resultant : all symptoms resolved  MRI R parieto-occipital cortical infarct   MRA prox R M2 severe stenosis/occlusion.   2D Echo unremarkable  CT angio neck unremarkable   EEG sharp activity noted in the left temporal region. a focal disturbance  with epileptogenic potential. She denies any symptoms. Discussed with patient, she is to call if she has any seizure-like activity. Monitor clinically at this time.  TEE unremarkable  Continue to follow with [facility]:   continue Crestor  needs close follow up with primary care for diabetes management  Continue ASA 325mg , not sure who placed her on Plavix additionally but she is doing well on this combination so can continue understands risk of bleeding  Patient counseled to be compliant with her antithrombotic medications, also advised to stop smoking and abstain from crack cocaine    R PCom Aneurysm  Will repeat imaging   Repeat MRA head to follow 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   I had a long d/w patient about her recent stroke, risk for recurrent stroke/TIAs, personally independently reviewed imaging studies and stroke evaluation results and answered questions.Continue ASA and Plavix for secondary stroke prevention and maintain strict control of hypertension with blood pressure goal below 130/90, diabetes with hemoglobin A1c goal below 6.5% and lipids with LDL cholesterol goal below 70 mg/dL. I also advised the patient to eat a healthy diet with plenty of whole grains, cereals, fruits and vegetables, exercise regularly and maintain ideal body weight .Followup in the future with me in 6 months or call earlier if necessary.   Sarina Ill, MD  [facility] [address] [place], [place] [address]  Phone [phone] Fax [phone]  A total of 25 minutes was  spent face-to-face with this patient. Over half this time was spent on counseling patient on the aneurysm, cryptogenic stroke diagnosis and different diagnostic and therapeutic options available.

## 2017-01-16 ENCOUNTER — Telehealth: Payer: Self-pay | Admitting: Family Medicine

## 2017-01-16 NOTE — Telephone Encounter (Signed)
Pt states that she received a call, was returning call. Please f/u. Thank you.

## 2017-01-16 NOTE — Progress Notes (Signed)
Writer sent letter to patient with latest lab results.

## 2017-01-20 ENCOUNTER — Ambulatory Visit
Admission: RE | Admit: 2017-01-20 | Discharge: 2017-01-20 | Disposition: A | Discharge status: Home / Self Care | Payer: Medicaid Other | Source: Ambulatory Visit | Attending: Neurology | Admitting: Neurology

## 2017-01-20 DIAGNOSIS — I639 Cerebral infarction, unspecified: Secondary | ICD-10-CM | POA: Diagnosis not present

## 2017-01-20 DIAGNOSIS — I671 Cerebral aneurysm, nonruptured: Secondary | ICD-10-CM

## 2017-01-25 ENCOUNTER — Ambulatory Visit (INDEPENDENT_AMBULATORY_CARE_PROVIDER_SITE_OTHER): Payer: Medicaid Other | Admitting: *Deleted

## 2017-01-25 DIAGNOSIS — I63011 Cerebral infarction due to thrombosis of right vertebral artery: Secondary | ICD-10-CM | POA: Diagnosis not present

## 2017-01-26 ENCOUNTER — Telehealth: Payer: Self-pay

## 2017-01-26 NOTE — Telephone Encounter (Signed)
Called both home and cell #s listed. Pt not available and no VM set up. Will try again later.

## 2017-01-26 NOTE — Telephone Encounter (Signed)
Tried to reach pt again w/o success.

## 2017-01-26 NOTE — Telephone Encounter (Signed)
-----   Message from Melvenia Beam, MD sent at 01/23/2017  8:13 PM EDT ----- MRA and the aneurysm is stable. However she has atherosclerotic disease in her blood vessels and really need to quit smoking and control other vascular risk factors. thanks

## 2017-01-26 NOTE — Progress Notes (Signed)
[facility] Summary Report / Loop Recorder 

## 2017-02-01 ENCOUNTER — Telehealth: Payer: Self-pay | Admitting: Cardiology

## 2017-02-01 ENCOUNTER — Encounter: Payer: Self-pay | Admitting: Family Medicine

## 2017-02-01 NOTE — Telephone Encounter (Signed)
LMOVM requesting that pt send manual transmission b/c home monitor has not updated in at least 14 days.    

## 2017-02-02 NOTE — Telephone Encounter (Signed)
Pt called for test results. She can be reached at [phone]

## 2017-02-02 NOTE — Telephone Encounter (Signed)
Called pt w/ stable MRI results and instructions for managing risk factors. Verbalized understanding and appreciation for call.

## 2017-02-06 LAB — CUP PACEART REMOTE DEVICE CHECK
Date Time Interrogation Session: 20180503044033
Implantable Pulse Generator Implant Date: 20161111

## 2017-02-08 ENCOUNTER — Encounter: Payer: Self-pay | Admitting: Family Medicine

## 2017-02-08 ENCOUNTER — Other Ambulatory Visit (HOSPITAL_COMMUNITY)
Admission: RE | Admit: 2017-02-08 | Discharge: 2017-02-08 | Disposition: A | Discharge status: Home / Self Care | Payer: Medicaid Other | Source: Ambulatory Visit | Attending: Family Medicine | Admitting: Family Medicine

## 2017-02-08 ENCOUNTER — Other Ambulatory Visit: Payer: Self-pay | Admitting: Family Medicine

## 2017-02-08 ENCOUNTER — Ambulatory Visit: Payer: Medicaid Other | Attending: Family Medicine | Admitting: Family Medicine

## 2017-02-08 VITALS — BP 118/68 | HR 93 | Temp 98.4°F | Wt 161.0 lb

## 2017-02-08 DIAGNOSIS — E1151 Type 2 diabetes mellitus with 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 gangrene: Secondary | ICD-10-CM

## 2017-02-08 DIAGNOSIS — F329 Major depressive disorder, single episode, unspecified: Secondary | ICD-10-CM | POA: Diagnosis present

## 2017-02-08 DIAGNOSIS — Z1159 Encounter for screening for other viral diseases: Secondary | ICD-10-CM

## 2017-02-08 DIAGNOSIS — Z794 Long term (current) use of insulin: Secondary | ICD-10-CM

## 2017-02-08 DIAGNOSIS — F339 Major depressive disorder, recurrent, unspecified: Secondary | ICD-10-CM | POA: Diagnosis not present

## 2017-02-08 DIAGNOSIS — Z1239 Encounter for other screening for malignant neoplasm of breast: Secondary | ICD-10-CM

## 2017-02-08 DIAGNOSIS — Z124 Encounter for screening for malignant neoplasm of cervix: Secondary | ICD-10-CM | POA: Insufficient documentation

## 2017-02-08 DIAGNOSIS — Z0000 Encounter for general adult medical examination without abnormal findings: Secondary | ICD-10-CM

## 2017-02-08 DIAGNOSIS — Z1231 Encounter for screening mammogram for malignant neoplasm of breast: Secondary | ICD-10-CM

## 2017-02-08 LAB — GLUCOSE, POCT (MANUAL RESULT ENTRY): POC GLUCOSE: 257 mg/dL — AB (ref 70–99)

## 2017-02-08 MED ORDER — ESCITALOPRAM OXALATE 20 MG PO TABS: 20.0000 mg | ORAL_TABLET | Freq: Every day | ORAL | 3 refills | Status: DC

## 2017-02-08 NOTE — Progress Notes (Signed)
Subjective:  Patient ID: Lisa Anderson, female    DOB: 05/14/1956  Age: 61 y.o. MRN: [id number]  CC: Annual Exam   HPI Lisa Anderson presents for a complete physical exam. She complains of worsening symptoms of depression ever since she discontinued her Lexapro. Lexapro had been discontinued due to risk of serotonin syndrome given she was already on Cymbalta but the patient would like to get back on Lexapro as she states Cymbalta is just for her pain and Lexapro helps with her anxiety and depression  Past Medical History:  Diagnosis Date  . Asthma   . Cancer ([place])    breast  . Chronic congestive heart failure with left ventricular diastolic dysfunction ([place])   . Diabetes mellitus   . Hyperlipidemia   . Hypertension   . Stroke [facility])     Past Surgical History:  Procedure Laterality Date  . BREAST LUMPECTOMY    . EP IMPLANTABLE DEVICE N/A 08/07/2015   Procedure: Loop Recorder Insertion;  Surgeon: Thompson Grayer, MD;  Location: [place] [facility];  Service: Cardiovascular;  Laterality: N/A;  . TEE WITHOUT CARDIOVERSION N/A 08/07/2015   Procedure: TRANSESOPHAGEAL ECHOCARDIOGRAM (TEE);  Surgeon: Sueanne Margarita, MD;  Location: [facility] ENDOSCOPY;  Service: Cardiovascular;  Laterality: N/A;  . TONSILLECTOMY      Allergies  Allergen Reactions  . Prednisone Hives and Other (See Comments)    Made her "crazy", suicidal  . Benicar [Olmesartan] Other (See Comments)    Did not work at all for patient  . Celexa [Citalopram] Other (See Comments)    Made patient feel crazy, fluoxetine is fine  . Effexor [Venlafaxine] Other (See Comments)    Made patient feel crazy  . Shrimp [Shellfish Allergy] Hives and Swelling      Outpatient Medications Prior to Visit  Medication Sig Dispense Refill  . albuterol (PROVENTIL HFA;VENTOLIN HFA) 108 (90 Base) MCG/ACT inhaler Inhale 2 puffs into the lungs every 6 (six) hours as needed for wheezing or shortness of breath. Shortness of breath 1 Inhaler 3  .  aspirin EC 325 MG tablet Take 325 mg by mouth daily.    . beclomethasone (QVAR) 80 MCG/ACT inhaler Inhale 1 puff into the lungs 2 (two) times daily. Rinse mouth with water after use to reduce aftertaste and incidence of candidiasis. Do not swallow.    . clopidogrel (PLAVIX) 75 MG tablet Take 1 tablet (75 mg total) by mouth daily. 90 tablet 3  . diltiazem (CARDIZEM CD) 360 MG 24 hr capsule Take 1 capsule (360 mg total) by mouth daily. 90 capsule 3  . DULoxetine (CYMBALTA) 60 MG capsule Take 1 capsule (60 mg total) by mouth daily. 60 capsule 3  . folic acid (FOLVITE) 1 MG tablet Take 1 tablet (1 mg total) by mouth daily.    . furosemide (LASIX) 40 MG tablet Take 1 tablet (40 mg total) by mouth daily. 90 tablet 3  . glipiZIDE (GLUCOTROL) 5 MG tablet Take 1 tablet (5 mg total) by mouth 2 (two) times daily before a meal. 60 tablet 3  . hydrALAZINE (APRESOLINE) 50 MG tablet Take 1 tablet (50 mg total) by mouth 3 (three) times daily. 270 tablet 3  . insulin aspart (NOVOLOG) 100 UNIT/ML injection Sliding scale CBG 70 - 120: 0 units CBG 121 - 150: 1 unit,  CBG 151 - 200: 2 units,  CBG 201 - 250: 3 units,  CBG 251 - 300: 5 units,  CBG 301 - 350: 7 units,  CBG 351 - 400: 9  units   CBG > 400: 9 units and notify your MD 1 vial 12  . insulin glargine (LANTUS) 100 UNIT/ML injection Inject 0.15 mLs (15 Units total) into the skin at bedtime. 10 mL 3  . metFORMIN (GLUCOPHAGE) 1000 MG tablet Take 1 tablet (1,000 mg total) by mouth 2 (two) times daily with a meal. 60 tablet 3  . Multiple Vitamin (MULTIVITAMIN WITH MINERALS) TABS tablet Take 1 tablet by mouth daily.    . potassium chloride SA (K-DUR,KLOR-CON) 20 MEQ tablet Take 1 tablet (20 mEq total) by mouth daily. 90 tablet 3  . rosuvastatin (CRESTOR) 20 MG tablet Take 2 tablets (40 mg total) by mouth daily. 180 tablet 3  . traZODone (DESYREL) 150 MG tablet Take 1 tablet (150 mg total) by mouth at bedtime. 30 tablet 3  . escitalopram (LEXAPRO) 20 MG tablet Take 1  tablet (20 mg total) by mouth daily. 30 tablet 0   No facility-administered medications prior to visit.     ROS Review of Systems  Constitutional: Negative for activity change, appetite change and fatigue.  HENT: Negative for congestion, sinus pressure and sore throat.   Eyes: Negative for visual disturbance.  Respiratory: Negative for cough, chest tightness, shortness of breath and wheezing.   Cardiovascular: Negative for chest pain and palpitations.  Gastrointestinal: Negative for abdominal distention, abdominal pain and constipation.  Endocrine: Negative for polydipsia.  Genitourinary: Negative for dysuria and frequency.  Musculoskeletal: Negative for arthralgias and back pain.  Skin: Negative for rash.  Neurological: Negative for tremors, light-headedness and numbness.  Hematological: Does not bruise/bleed easily.  Psychiatric/Behavioral: Negative for agitation, behavioral problems and suicidal ideas.    Objective:  BP 118/68   Pulse 93   Temp 98.4 F (36.9 C) (Oral)   Wt 161 lb (73 kg)   SpO2 97%   BMI 26.79 kg/m   BP/Weight 02/08/2017 03/23/3661 05/30/7653  Systolic BP 650 354 656  Diastolic BP 68 72 70  Wt. (Lbs) 161 157.6 162.6  BMI 26.79 26.23 27.06  Some encounter information is confidential and restricted. Go to Review Flowsheets activity to see all data.      Physical Exam  Constitutional: She is oriented to person, place, and time. She appears well-developed and well-nourished. No distress.  HENT:  Head: Normocephalic.  Right Ear: External ear normal.  Left Ear: External ear normal.  Nose: Nose normal.  Mouth/Throat: Oropharynx is clear and moist.  Eyes: Conjunctivae and EOM are normal. Pupils are equal, round, and reactive to light.  Neck: Normal range of motion. No JVD present.  Cardiovascular: Normal rate, regular rhythm, normal heart sounds and intact distal pulses.  Exam reveals no gallop.   No murmur heard. Pulmonary/Chest: Effort normal and breath  sounds normal. No respiratory distress. She has no wheezes. She has no rales. She exhibits no tenderness. Right breast exhibits no mass and no tenderness. Left breast exhibits no mass and no tenderness.  Abdominal: Soft. Bowel sounds are normal. She exhibits no distension and no mass. There is no tenderness.  Genitourinary:  Genitourinary Comments: Normal external genitalia Vagina, cervix, adnexa are all normal  Musculoskeletal: Normal range of motion. She exhibits no edema or tenderness.  Neurological: She is alert and oriented to person, place, and time. She has normal reflexes.  Skin: Skin is warm and dry. She is not diaphoretic.  Psychiatric: She has a normal mood and affect.     Assessment & Plan:   1. Type 2 diabetes mellitus with 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  gangrene, with long-term current use of insulin (HCC) A1c of 8.6 Tighter glycemic control-diabetic diet, lifestyle modifications - POCT glucose (manual entry) - Microalbumin / creatinine urine ratio  2. Annual physical exam Referred for colonoscopy last month and gastroenterologist was unable to reach her to schedule appointment She has been provided with the number to [facility] to reschedule  3. Screening for breast cancer - MM Digital Screening; Future  4. Screening for cervical cancer - Cytology - PAP [facility]  5. Screening for viral disease - Hepatitis c antibody (reflex) - HIV antibody (with reflex)  6. Recurrent major depressive disorder, remission status unspecified ([place]) Refilled Lexapro I have discussed serotonin syndrome with the patient and she would still want to proceed with Lexapro.   Meds ordered this encounter  Medications  . escitalopram (LEXAPRO) 20 MG tablet    Sig: Take 1 tablet (20 mg total) by mouth daily.    Dispense:  30 tablet    Refill:  3    Follow-up: Return in about 3 months (around 05/11/2017) for follow up of chronic medical conditions.   Arnoldo Morale MD

## 2017-02-08 NOTE — Patient Instructions (Signed)
Health Maintenance, Female Adopting a healthy lifestyle and getting preventive care can go a long way to promote health and wellness. Talk with your health care provider about what schedule of regular examinations is right for you. This is a good chance for you to check in with your provider about disease prevention and staying healthy. In between checkups, there are plenty of things you can do on your own. Experts have done a lot of research about which lifestyle changes and preventive measures are most likely to keep you healthy. Ask your health care provider for more information. Weight and diet Eat a healthy diet  Be sure to include plenty of vegetables, fruits, low-fat dairy products, and lean protein.  Do not eat a lot of foods high in solid fats, added sugars, or salt.  Get regular exercise. This is one of the most important things you can do for your health.  Most adults should exercise for at least 150 minutes each week. The exercise should increase your heart rate and make you sweat (moderate-intensity exercise).  Most adults should also do strengthening exercises at least twice a week. This is in addition to the moderate-intensity exercise. Maintain a healthy weight  Body mass index (BMI) is a measurement that can be used to identify possible weight problems. It estimates body fat based on height and weight. Your health care provider can help determine your BMI and help you achieve or maintain a healthy weight.  For females 59 years of age and older:  A BMI below 18.5 is considered underweight.  A BMI of 18.5 to 24.9 is normal.  A BMI of 25 to 29.9 is considered overweight.  A BMI of 30 and above is considered obese. Watch levels of cholesterol and blood lipids  You should start having your blood tested for lipids and cholesterol at 59 years of age, then have this test every 5 years.  You may need to have your cholesterol levels checked more often if:  Your lipid or  cholesterol levels are high.  You are older than 59 years of age.  You are at high risk for heart disease. Cancer screening Lung Cancer  Lung cancer screening is recommended for adults 59-42 years old who are at high risk for lung cancer because of a history of smoking.  A yearly low-dose CT scan of the lungs is recommended for people who:  Currently smoke.  Have quit within the past 15 years.  Have at least a 30-pack-year history of smoking. A pack year is smoking an average of one pack of cigarettes a day for 1 year.  Yearly screening should continue until it has been 59 years since you quit.  Yearly screening should stop if you develop a health problem that would prevent you from having lung cancer treatment. Breast Cancer  Practice breast self-awareness. This means understanding how your breasts normally appear and feel.  It also means doing regular breast self-exams. Let your health care provider know about any changes, no matter how small.  If you are in your 20s or 30s, you should have a clinical breast exam (CBE) by a health care provider every 1-3 years as part of a regular health exam.  If you are 34 or older, have a CBE every year. Also consider having a breast X-ray (mammogram) every year.  If you have a family history of breast cancer, talk to your health care provider about genetic screening.  If you are at high risk for breast cancer, talk  to your health care provider about having an MRI and a mammogram every year.  Breast cancer gene (BRCA) assessment is recommended for women who have family members with BRCA-related cancers. BRCA-related cancers include:  Breast.  Ovarian.  Tubal.  Peritoneal cancers.  Results of the assessment will determine the need for genetic counseling and BRCA1 and BRCA2 testing. Cervical Cancer  Your health care provider may recommend that you be screened regularly for cancer of the pelvic organs (ovaries, uterus, and vagina).  This screening involves a pelvic examination, including checking for microscopic changes to the surface of your cervix (Pap test). You may be encouraged to have this screening done every 3 years, beginning at age 24.  For women ages 59-65, health care providers may recommend pelvic exams and Pap testing every 3 years, or they may recommend the Pap and pelvic exam, combined with testing for human papilloma virus (HPV), every 5 years. Some types of HPV increase your risk of cervical cancer. Testing for HPV may also be done on women of any age with unclear Pap test results.  Other health care providers may not recommend any screening for nonpregnant women who are considered low risk for pelvic cancer and who do not have symptoms. Ask your health care provider if a screening pelvic exam is right for you.  If you have had past treatment for cervical cancer or a condition that could lead to cancer, you need Pap tests and screening for cancer for at least 20 years after your treatment. If Pap tests have been discontinued, your risk factors (such as having a new sexual partner) need to be reassessed to determine if screening should resume. Some women have medical problems that increase the chance of getting cervical cancer. In these cases, your health care provider may recommend more frequent screening and Pap tests. Colorectal Cancer  This type of cancer can be detected and often prevented.  Routine colorectal cancer screening usually begins at 59 years of age and continues through 59 years of age.  Your health care provider may recommend screening at an earlier age if you have risk factors for colon cancer.  Your health care provider may also recommend using home test kits to check for hidden blood in the stool.  A small camera at the end of a tube can be used to examine your colon directly (sigmoidoscopy or colonoscopy). This is done to check for the earliest forms of colorectal cancer.  Routine  screening usually begins at age 41.  Direct examination of the colon should be repeated every 5-10 years through 59 years of age. However, you may need to be screened more often if early forms of precancerous polyps or small growths are found. Skin Cancer  Check your skin from head to toe regularly.  Tell your health care provider about any new moles or changes in moles, especially if there is a change in a mole's shape or color.  Also tell your health care provider if you have a mole that is larger than the size of a pencil eraser.  Always use sunscreen. Apply sunscreen liberally and repeatedly throughout the day.  Protect yourself by wearing long sleeves, pants, a wide-brimmed hat, and sunglasses whenever you are outside. Heart disease, diabetes, and high blood pressure  High blood pressure causes heart disease and increases the risk of stroke. High blood pressure is more likely to develop in:  People who have blood pressure in the high end of the normal range (130-139/85-89 mm Hg).  People who are overweight or obese.  People who are African American.  If you are 59-24 years of age, have your blood pressure checked every 3-5 years. If you are 34 years of age or older, have your blood pressure checked every year. You should have your blood pressure measured twice-once when you are at a hospital or clinic, and once when you are not at a hospital or clinic. Record the average of the two measurements. To check your blood pressure when you are not at a hospital or clinic, you can use:  An automated blood pressure machine at a pharmacy.  A home blood pressure monitor.  If you are between 29 years and 60 years old, ask your health care provider if you should take aspirin to prevent strokes.  Have regular diabetes screenings. This involves taking a blood sample to check your fasting blood sugar level.  If you are at a normal weight and have a low risk for diabetes, have this test once  every three years after 59 years of age.  If you are overweight and have a high risk for diabetes, consider being tested at a younger age or more often. Preventing infection Hepatitis B  If you have a higher risk for hepatitis B, you should be screened for this virus. You are considered at high risk for hepatitis B if:  You were born in a country where hepatitis B is common. Ask your health care provider which countries are considered high risk.  Your parents were born in a high-risk country, and you have not been immunized against hepatitis B (hepatitis B vaccine).  You have HIV or AIDS.  You use needles to inject street drugs.  You live with someone who has hepatitis B.  You have had sex with someone who has hepatitis B.  You get hemodialysis treatment.  You take certain medicines for conditions, including cancer, organ transplantation, and autoimmune conditions. Hepatitis C  Blood testing is recommended for:  Everyone born from [birth year] through 1963.  Anyone with known risk factors for hepatitis C. Sexually transmitted infections (STIs)  You should be screened for sexually transmitted infections (STIs) including gonorrhea and chlamydia if:  You are sexually active and are younger than 59 years of age.  You are older than 59 years of age and your health care provider tells you that you are at risk for this type of infection.  Your sexual activity has changed since you were last screened and you are at an increased risk for chlamydia or gonorrhea. Ask your health care provider if you are at risk.  If you do not have HIV, but are at risk, it may be recommended that you take a prescription medicine daily to prevent HIV infection. This is called pre-exposure prophylaxis (PrEP). You are considered at risk if:  You are sexually active and do not regularly use condoms or know the HIV status of your partner(s).  You take drugs by injection.  You are sexually active with a partner  who has HIV. Talk with your health care provider about whether you are at high risk of being infected with HIV. If you choose to begin PrEP, you should first be tested for HIV. You should then be tested every 3 months for as long as you are taking PrEP. Pregnancy  If you are premenopausal and you may become pregnant, ask your health care provider about preconception counseling.  If you may become pregnant, take 400 to 800 micrograms (mcg) of folic acid  every day.  If you want to prevent pregnancy, talk to your health care provider about birth control (contraception). Osteoporosis and menopause  Osteoporosis is a disease in which the bones lose minerals and strength with aging. This can result in serious bone fractures. Your risk for osteoporosis can be identified using a bone density scan.  If you are 4 years of age or older, or if you are at risk for osteoporosis and fractures, ask your health care provider if you should be screened.  Ask your health care provider whether you should take a calcium or vitamin D supplement to lower your risk for osteoporosis.  Menopause may have certain physical symptoms and risks.  Hormone replacement therapy may reduce some of these symptoms and risks. Talk to your health care provider about whether hormone replacement therapy is right for you. Follow these instructions at home:  Schedule regular health, dental, and eye exams.  Stay current with your immunizations.  Do not use any tobacco products including cigarettes, chewing tobacco, or electronic cigarettes.  If you are pregnant, do not drink alcohol.  If you are breastfeeding, limit how much and how often you drink alcohol.  Limit alcohol intake to no more than 1 drink per day for nonpregnant women. One drink equals 12 ounces of beer, 5 ounces of wine, or 1 ounces of hard liquor.  Do not use street drugs.  Do not share needles.  Ask your health care provider for help if you need support  or information about quitting drugs.  Tell your health care provider if you often feel depressed.  Tell your health care provider if you have ever been abused or do not feel safe at home. This information is not intended to replace advice given to you by your health care provider. Make sure you discuss any questions you have with your health care provider. Document Released: 03/28/2011 Document Revised: 02/18/2016 Document Reviewed: 06/16/2015 Elsevier Interactive Patient Education  2017 [employer].

## 2017-02-09 LAB — HCV COMMENT:

## 2017-02-09 LAB — HIV ANTIBODY (ROUTINE TESTING W REFLEX): HIV Screen 4th Generation wRfx: NONREACTIVE

## 2017-02-09 LAB — MICROALBUMIN / CREATININE URINE RATIO
Creatinine, Urine: 239.4 mg/dL
MICROALB/CREAT RATIO: 6.2 mg/g{creat} (ref 0.0–30.0)
Microalbumin, Urine: 14.8 ug/mL

## 2017-02-09 LAB — HEPATITIS C ANTIBODY (REFLEX)

## 2017-02-13 ENCOUNTER — Telehealth: Payer: Self-pay | Admitting: *Deleted

## 2017-02-13 LAB — CYTOLOGY - PAP
Diagnosis: NEGATIVE
HPV: NOT DETECTED

## 2017-02-13 NOTE — Telephone Encounter (Signed)
-----   Message from Arnoldo Morale, MD sent at 02/10/2017  5:06 PM EDT ----- Please inform the patient that labs are normal. Thank you.

## 2017-02-13 NOTE — Telephone Encounter (Signed)
Patient verified DOB Patient is aware of labs being normal. No further questions at this time.

## 2017-02-14 NOTE — Progress Notes (Signed)
Cardiology Office Note Date:  02/15/2017  Patient ID:  Lisa Anderson, DOB Jul 09, 1956, MRN [id number] PCP:  Arnoldo Morale, MD  Electrophysiologist:  Dr. Rayann Heman   Chief Complaint: planned f/u visit to review response to therapy changes  History of Present Illness: Lisa Anderson is a 61 y.o. female with history of CVA, TIA's w/ILR, no AF to date at her last visit with Dr. Rayann Heman in March though noted SVT, Migraines, asthma, chronic CHF (diastolic), HTN, HLD, breast cancer tx surgically, DM.  She comes in today to be seen for Dr. 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    She has gotten established with neurology and PMD.  She has not had any AF on loop interrogation.  She reports she has cut down smoking to 4-5 per day only, infrequently will have a single ETOH drink, and has not used cocaine or any illegal drugs since her stroke.  She has not had any CP or palpitations, in the last month she has struggled with significant anxiety and depression, with days of crying and worry, very emotional, her PMD resumed her Lexapro Monday.  She has not had any dizziness, near syncope or syncope, she c/w headaches.   Device information: MDT ILR implanted 08/07/15, cryptogenic stroke, Dr. Rayann Heman   Past Medical History:  Diagnosis Date  . Asthma   . Cancer ([place])    breast  . Chronic congestive heart failure with left ventricular diastolic dysfunction ([place])   . Diabetes mellitus   . Hyperlipidemia   . Hypertension   . Stroke [facility])     Past Surgical History:  Procedure Laterality Date  . BREAST  LUMPECTOMY    . EP IMPLANTABLE DEVICE N/A 08/07/2015   Procedure: Loop Recorder Insertion;  Surgeon: Thompson Grayer, MD;  Location: [place] [facility];  Service: Cardiovascular;  Laterality: N/A;  . TEE WITHOUT CARDIOVERSION N/A 08/07/2015   Procedure: TRANSESOPHAGEAL ECHOCARDIOGRAM (TEE);  Surgeon: Sueanne Margarita, MD;  Location: [facility] ENDOSCOPY;  Service: Cardiovascular;  Laterality: N/A;  . TONSILLECTOMY      Current Outpatient Prescriptions  Medication Sig Dispense Refill  . albuterol (PROVENTIL HFA;VENTOLIN HFA) 108 (90 Base) MCG/ACT inhaler Inhale 2 puffs into the lungs every 6 (six) hours as needed for wheezing or shortness of breath. Shortness of breath 1 Inhaler 3  . aspirin EC 325 MG tablet Take 325 mg by mouth daily.    . beclomethasone (QVAR) 80 MCG/ACT inhaler Inhale 1 puff into the lungs 2 (two) times daily. Rinse mouth with water after use to reduce aftertaste and incidence of candidiasis. Do not swallow.    . clopidogrel (PLAVIX) 75 MG tablet Take 1 tablet (75 mg total) by mouth daily. 90 tablet 3  . diltiazem (CARDIZEM CD) 360 MG 24 hr capsule Take 1 capsule (360 mg total) by mouth daily. 90 capsule 3  . DULoxetine (CYMBALTA) 60 MG capsule Take 1 capsule (60 mg total) by mouth daily. 60 capsule 3  . escitalopram (LEXAPRO) 20 MG tablet Take 1 tablet (20 mg total) by mouth daily. 30 tablet 3  . folic  acid (FOLVITE) 1 MG tablet Take 1 tablet (1 mg total) by mouth daily.    . furosemide (LASIX) 40 MG tablet Take 1 tablet (40 mg total) by mouth daily. 90 tablet 3  . glipiZIDE (GLUCOTROL) 5 MG tablet Take 1 tablet (5 mg total) by mouth 2 (two) times daily before a meal. 60 tablet 3  . hydrALAZINE (APRESOLINE) 50 MG tablet Take 1 tablet (50 mg total) by mouth 3 (three) times daily. 270 tablet 3  . insulin aspart (NOVOLOG) 100 UNIT/ML injection Sliding scale CBG 70 - 120: 0 units CBG 121 - 150: 1 unit,  CBG 151 - 200: 2 units,  CBG 201 - 250: 3 units,  CBG 251 - 300: 5 units,  CBG 301 - 350:  7 units,  CBG 351 - 400: 9 units   CBG > 400: 9 units and notify your MD 1 vial 12  . insulin glargine (LANTUS) 100 UNIT/ML injection Inject 0.15 mLs (15 Units total) into the skin at bedtime. 10 mL 3  . metFORMIN (GLUCOPHAGE) 1000 MG tablet Take 1 tablet (1,000 mg total) by mouth 2 (two) times daily with a meal. 60 tablet 3  . Multiple Vitamin (MULTIVITAMIN WITH MINERALS) TABS tablet Take 1 tablet by mouth daily.    . potassium chloride SA (K-DUR,KLOR-CON) 20 MEQ tablet Take 1 tablet (20 mEq total) by mouth daily. 90 tablet 3  . rosuvastatin (CRESTOR) 20 MG tablet Take 2 tablets (40 mg total) by mouth daily. 180 tablet 3  . traZODone (DESYREL) 150 MG tablet Take 1 tablet (150 mg total) by mouth at bedtime. 30 tablet 3   No current facility-administered medications for this visit.     Allergies:   Prednisone; Benicar [olmesartan]; Celexa [citalopram]; Effexor [venlafaxine]; and Shrimp [shellfish allergy]   Social History:  The patient  reports that she has been smoking.  She has been smoking about 0.50 packs per day. She has never used smokeless tobacco. She reports that she drinks about 3.6 oz of alcohol per week . She reports that she uses drugs, including "Crack" cocaine.   Family History:  The patient's family history includes Asthma in her father; Cancer in her mother; Diabetes in her mother; Hyperlipidemia in her mother; Hypertension in her mother; Rheum arthritis in her father; Stroke in her father; Thyroid disease in her brother and father.  ROS:  Please see the history of present illness.  All other systems are reviewed and otherwise negative.   PHYSICAL EXAM:  VS:  BP (!) 144/76   Pulse 94   Ht 5\' 5"  (1.651 m)   Wt 159 lb (72.1 kg)   BMI 26.46 kg/m  BMI: Body mass index is 26.46 kg/m. Well nourished, well developed, in no acute distress  HEENT: normocephalic, atraumatic  Neck: + JVDpersists, carotid bruits or masses Cardiac:  RRR; no significant murmurs, no rubs, or  gallops Lungs: CTA b/l, no wheezing, rhonchi or rales  Abd: soft, nontender MS: no deformity or atrophy Ext: no edema is appreciated Skin: warm and dry, no rash Neuro:  No gross deficits appreciated Psych: euthymic mood, full affect  ILR site is stable, no tethering or discomfort   EKG:  Done 11/13/16: SR  12/15/16: TTE Study Conclusions - Left ventricle: The cavity size was normal. Wall thickness was   normal. Systolic function was normal. The estimated ejection   fraction was in the range of 60% to 65%. Wall motion was normal;   there were no regional wall motion abnormalities. Doppler  parameters are consistent with abnormal left ventricular   relaxation (grade 1 diastolic dysfunction).  Recent Labs: 11/13/2016: Hemoglobin 11.7; Platelets 272 12/12/2016: BUN 15; Creatinine, Ser 1.14; NT-Pro BNP 31; Potassium 3.8; Sodium 142 01/02/2017: TSH 0.680  No results found for requested labs within last 8760 hours.   CrCl cannot be calculated (Patient's most recent lab result is older than the maximum 21 days allowed.).   Wt Readings from Last 3 Encounters:  02/15/17 159 lb (72.1 kg)  02/08/17 161 lb (73 kg)  01/11/17 157 lb 9.6 oz (71.5 kg)     Other studies reviewed: Additional studies/records reviewed today include: summarized above  ASSESSMENT AND PLAN:  1. Chronic CHF (diastolic)     Recent exacerbation of fluid OL     Only down 2 lbs, her edema is better still with JVD     She is counseled on diet/sodium restrictioin     Will increase her lasix to 60mg  daily will check BMET today  2. HTN, hypertensive heart disease     Needs strict control given secrebla aneurysm     Last week at another MD visit was 118/68, 131/72 prior to that She was recently started on Lexapro for worsening depression and anxiety with widely swinging moods, hopefully improvement in this will aid her BP control as well.    3. Stroke w/ILR     no AF to date     Neuro notes to continue ASA 325mg   dose     4. SVT     None further      Long acting dilt  5. She is counseled on smoking     She tried a nicotine patch of her son's but seemed to exacerbate her headaches, she will discuss w/neurology if OK to retry      She has not used cocaine or any drugs since her stroke and congratulated on this huge accomplichment    Disposition: Will have her see [place] for BP check in a few weeks, get BMET then as well with increased lasix dose.  Will see her back in 3 months sooner if needed.  She will call neurology and/or her PMD regarding her ongoing headaches  Current medicines are reviewed at length with the patient today.  The patient did not have any concerns regarding medicines.  Haywood Lasso, PA-C 02/15/2017 2:15 PM     [address] [place] [address] [phone] (office)  [phone] (fax)

## 2017-02-15 ENCOUNTER — Ambulatory Visit (INDEPENDENT_AMBULATORY_CARE_PROVIDER_SITE_OTHER): Payer: Medicaid Other | Admitting: Physician Assistant

## 2017-02-15 VITALS — BP 144/76 | HR 94 | Ht 65.0 in | Wt 159.0 lb

## 2017-02-15 DIAGNOSIS — Z79899 Other long term (current) drug therapy: Secondary | ICD-10-CM

## 2017-02-15 DIAGNOSIS — I5032 Chronic diastolic (congestive) heart failure: Secondary | ICD-10-CM

## 2017-02-15 DIAGNOSIS — I471 Supraventricular tachycardia: Secondary | ICD-10-CM

## 2017-02-15 DIAGNOSIS — I110 Hypertensive heart disease with heart failure: Secondary | ICD-10-CM

## 2017-02-15 MED ORDER — FUROSEMIDE 40 MG PO TABS: 60.0000 mg | ORAL_TABLET | Freq: Every day | ORAL | 1 refills | Status: DC

## 2017-02-15 NOTE — Patient Instructions (Addendum)
Medication Instructions:   START TAKING LASIX 60 MG ONCE A DAY  ( TABLET AND HALF OF 40 MG )  If you need a refill on your cardiac medications before your next appointment, please call your pharmacy.  Labwork: BMET TODAY    Testing/Procedures:NONE ORDERED  TODAY    Follow-Up: WITH PHARM D BLOOD PRESSURE VISIT IN 3 WEEKS   RENEE IN 3 MIONTHS   Any Other Special Instructions Will Be Listed Below (If Applicable).

## 2017-02-16 ENCOUNTER — Ambulatory Visit: Payer: Medicaid Other

## 2017-02-16 LAB — BASIC METABOLIC PANEL
BUN/Creatinine Ratio: 14 (ref 9–23)
BUN: 18 mg/dL (ref 6–24)
CALCIUM: 9.8 mg/dL (ref 8.7–10.2)
CO2: 29 mmol/L (ref 18–29)
Chloride: 93 mmol/L — ABNORMAL LOW (ref 96–106)
Creatinine, Ser: 1.25 mg/dL — ABNORMAL HIGH (ref 0.57–1.00)
GFR, EST AFRICAN AMERICAN: 54 mL/min/{1.73_m2} — AB (ref >59)
GFR, EST NON AFRICAN AMERICAN: 47 mL/min/{1.73_m2} — AB (ref >59)
Glucose: 221 mg/dL — ABNORMAL HIGH (ref 65–99)
Potassium: 4.5 mmol/L (ref 3.5–5.2)
Sodium: 139 mmol/L (ref 134–144)

## 2017-02-17 ENCOUNTER — Ambulatory Visit (INDEPENDENT_AMBULATORY_CARE_PROVIDER_SITE_OTHER): Payer: Self-pay | Admitting: Gastroenterology

## 2017-02-17 ENCOUNTER — Telehealth: Payer: Self-pay | Admitting: *Deleted

## 2017-02-17 ENCOUNTER — Encounter: Payer: Self-pay | Admitting: Gastroenterology

## 2017-02-17 VITALS — BP 124/70 | HR 92 | Ht 65.0 in | Wt 157.0 lb

## 2017-02-17 DIAGNOSIS — R112 Nausea with vomiting, unspecified: Secondary | ICD-10-CM

## 2017-02-17 DIAGNOSIS — IMO0001 Reserved for inherently not codable concepts without codable children: Secondary | ICD-10-CM

## 2017-02-17 DIAGNOSIS — Z794 Long term (current) use of insulin: Secondary | ICD-10-CM

## 2017-02-17 DIAGNOSIS — K625 Hemorrhage of anus and rectum: Secondary | ICD-10-CM

## 2017-02-17 DIAGNOSIS — E119 Type 2 diabetes mellitus without complications: Secondary | ICD-10-CM

## 2017-02-17 DIAGNOSIS — Z800 Family history of malignant neoplasm of digestive organs: Secondary | ICD-10-CM

## 2017-02-17 DIAGNOSIS — K219 Gastro-esophageal reflux disease without esophagitis: Secondary | ICD-10-CM

## 2017-02-17 NOTE — Patient Instructions (Signed)
Start Miralax daily.  We will call you with the Plavix instructions from Dr. Jaynee Eagles.   You have been scheduled for an endoscopy and colonoscopy. Please follow the written instructions given to you at your visit today. Please pick up your prep supplies at the pharmacy within the next 1-3 days. If you use inhalers (even only as needed), please bring them with you on the day of your procedure. Your physician has requested that you go to www.startemmi.com and enter the access code given to you at your visit today. This web site gives a general overview about your procedure. However, you should still follow specific instructions given to you by our office regarding your preparation for the procedure.

## 2017-02-17 NOTE — Telephone Encounter (Signed)
RE: Lisa Anderson DOB: January 23, 1956 MRN: [id number]   Dear Dr. Sarina Ill,      We have scheduled the above patient for an endoscopic procedure. Our records show that she is on anticoagulation therapy.   Please advise as to how long the patient may come off her therapy of Plavix prior to the procedure, which is scheduled for 03-15-2017.  Please fax back/ or route the Plavix instructions to [facility] at [phone].   Sincerely,    Alonza Bogus PA-C

## 2017-02-21 NOTE — Telephone Encounter (Signed)
Lisa Anderson, They can come off their plavix for 5-7 days before procedure. Would prefer if they could stay on Aspirin if possible while they are off of Plavix, at the discretion of the physician performing the procedure. Please restart Plavix as soon as possible after procedure. There is a small risk of stroke while off of plavix and this should be discussed with patient. thanks

## 2017-02-22 ENCOUNTER — Encounter: Payer: Self-pay | Admitting: Gastroenterology

## 2017-02-22 NOTE — Progress Notes (Addendum)
02/17/2017 Lisa Anderson [id number] 1955-12-14   HISTORY OF PRESENT ILLNESS:  This is a 61 year old female who is new to our practice. She was referred here by her PCP, Dr. Jarold Song, to discuss colonoscopy. She reports family history of colon cancer in her brother who was diagnosed around age 15.  She reports constipation, but does not use anything for her on a regular basis. She says that after several days she will drink magnesium citrate, which does produce a bowel movement.  She also admits to bright red rectal bleeding on and off for the past 3 years. She says that sometimes it is quite a bit of blood. She recalls taking MiraLAX in the past and thinks that she did well with that but is unsure why she stopped taking the medication.  She also reports acid reflux and frequent nausea with episodes of vomiting. She says that the vomiting will usually occur randomly and can sometimes happen when she has only been drinking water. She recently began taking Nexium over-the-counter.  She is an insulin-dependent diabetic and has history of CVA for which she is on Plavix at the direction of Dr. Jaynee Eagles.  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 She has not had any drugs or alcohol since September 2017.    Past Medical History:  Diagnosis Date  . Asthma   . Cancer ([place])    breast  . Chronic congestive heart failure with left ventricular diastolic dysfunction ([place])   . Diabetes mellitus   . Hyperlipidemia   . Hypertension   . Stroke [facility])    Past Surgical History:  Procedure Laterality Date  . BREAST LUMPECTOMY    . EP IMPLANTABLE DEVICE N/A 08/07/2015   Procedure: Loop Recorder Insertion;  Surgeon: Thompson Grayer, MD;  Location: [place] [facility];  Service: Cardiovascular;  Laterality: N/A;  . TEE WITHOUT CARDIOVERSION N/A 08/07/2015   Procedure: TRANSESOPHAGEAL ECHOCARDIOGRAM (TEE);  Surgeon: Sueanne Margarita, MD;  Location: [facility] ENDOSCOPY;  Service:  Cardiovascular;  Laterality: N/A;  . TONSILLECTOMY      reports that she has been smoking.  She has been smoking about 0.50 packs per day. She has never used smokeless tobacco. She reports that she drinks about 3.6 oz of alcohol per week . She reports that she uses drugs, including "Crack" cocaine. family history includes Asthma in her father; Breast cancer in her mother and sister; Colon cancer in her brother; Diabetes in her mother; Hyperlipidemia in her mother; Hypertension in her mother; Rheum arthritis in her father; Stroke in her father; Thyroid disease in her brother and father. Allergies  Allergen Reactions  . Prednisone Hives and Other (See Comments)    Made her "crazy", suicidal  . Benicar [Olmesartan] Other (See Comments)    Did not work at all for patient  . Celexa [Citalopram] Other (See Comments)    Made patient feel crazy, fluoxetine is fine  . Effexor [Venlafaxine] Other (See Comments)    Made patient feel crazy  . Shrimp [Shellfish Allergy] Hives and Swelling      Outpatient Encounter Prescriptions as of 02/17/2017  Medication Sig  . albuterol (PROVENTIL HFA;VENTOLIN HFA) 108 (90 Base) MCG/ACT inhaler Inhale 2 puffs into the lungs every 6 (six) hours as needed for wheezing or shortness of breath. Shortness of breath  . aspirin EC 325 MG tablet Take 325 mg by mouth daily.  . beclomethasone (QVAR) 80 MCG/ACT inhaler Inhale 1 puff into the lungs 2 (  two) times daily. Rinse mouth with water after use to reduce aftertaste and incidence of candidiasis. Do not swallow.  . clopidogrel (PLAVIX) 75 MG tablet Take 1 tablet (75 mg total) by mouth daily.  Marland Kitchen diltiazem (CARDIZEM CD) 360 MG 24 hr capsule Take 1 capsule (360 mg total) by mouth daily.  . DULoxetine (CYMBALTA) 60 MG capsule Take 1 capsule (60 mg total) by mouth daily.  Marland Kitchen escitalopram (LEXAPRO) 20 MG tablet Take 1 tablet (20 mg total) by mouth daily.  . folic acid (FOLVITE) 1 MG tablet Take 1 tablet (1 mg total) by mouth  daily.  . furosemide (LASIX) 40 MG tablet Take 1.5 tablets (60 mg total) by mouth daily.  Marland Kitchen glipiZIDE (GLUCOTROL) 5 MG tablet Take 1 tablet (5 mg total) by mouth 2 (two) times daily before a meal.  . hydrALAZINE (APRESOLINE) 50 MG tablet Take 1 tablet (50 mg total) by mouth 3 (three) times daily.  . insulin aspart (NOVOLOG) 100 UNIT/ML injection Sliding scale CBG 70 - 120: 0 units CBG 121 - 150: 1 unit,  CBG 151 - 200: 2 units,  CBG 201 - 250: 3 units,  CBG 251 - 300: 5 units,  CBG 301 - 350: 7 units,  CBG 351 - 400: 9 units   CBG > 400: 9 units and notify your MD  . insulin glargine (LANTUS) 100 UNIT/ML injection Inject 0.15 mLs (15 Units total) into the skin at bedtime.  . metFORMIN (GLUCOPHAGE) 1000 MG tablet Take 1 tablet (1,000 mg total) by mouth 2 (two) times daily with a meal.  . potassium chloride SA (K-DUR,KLOR-CON) 20 MEQ tablet Take 1 tablet (20 mEq total) by mouth daily.  . rosuvastatin (CRESTOR) 20 MG tablet Take 2 tablets (40 mg total) by mouth daily.  . traZODone (DESYREL) 150 MG tablet Take 1 tablet (150 mg total) by mouth at bedtime.  . [DISCONTINUED] Multiple Vitamin (MULTIVITAMIN WITH MINERALS) TABS tablet Take 1 tablet by mouth daily.   No facility-administered encounter medications on file as of 02/17/2017.      REVIEW OF SYSTEMS  : All other systems reviewed and negative except where noted in the History of Present Illness.   PHYSICAL EXAM: BP 124/70   Pulse 92   Ht 5\' 5"  (1.651 m)   Wt 157 lb (71.2 kg)   BMI 26.13 kg/m  General: Well developed black female in no acute distress Head: Normocephalic and atraumatic Eyes:  Sclerae anicteric, conjunctiva pink. Ears: Normal auditory acuity Lungs: Clear throughout to auscultation; no increased WOB. Heart: Regular rate and rhythm Abdomen: Soft, non-distended. Normal bowel sounds.  Non-tender. Rectal:  Will be done at the time of colonoscopy. Musculoskeletal: Symmetrical with no gross deformities  Skin: No lesions on  visible extremities Extremities: No edema  Neurological: Alert oriented x 4, grossly non-focal Psychological:  Alert and cooperative. Normal mood and affect  ASSESSMENT AND PLAN: -Family history of colon cancer in her brother:  Never had one in the past.  Will schedule with Dr. Carlean Purl. -Rectal bleeding:  On and off for 3 years.  Likely hemorrhoidal.  Will be evaluated with colonoscopy. -Constipation:  Will begin Miralax daily. -GERD with nausea and intermittent vomiting:  Continue Nexium OTC for now.  Will schedule EGD with Dr. Carlean Purl as well.  ? Gastroparesis. -IDDM:  Insulin will be adjusted prior to endoscopic procedure per protocol. Will resume normal dosing after procedure. -Chronic antiplatelet use for history of CVA:  Hold Plavix for 5 days before procedure - will instruct  when and how to resume after procedure. Risks and benefits of procedure including bleeding, perforation, infection, missed lesions, medication reactions and possible hospitalization or surgery if complications occur explained. Additional rare but real risk of cardiovascular event such as heart attack or ischemia/infarct of other organs off of Plavix was explained and need to seek urgent help if this occurs. Will communicate by phone or EMR with patient's prescribing provider, Dr. Jaynee Eagles, that to confirm holding Plavix is reasonable in this case.    CC:  Arnoldo Morale, MD   Agree with Ms. Saina Waage's management.  Gatha Mayer, MD, [facility]

## 2017-02-23 DIAGNOSIS — R112 Nausea with vomiting, unspecified: Secondary | ICD-10-CM | POA: Insufficient documentation

## 2017-02-23 DIAGNOSIS — K625 Hemorrhage of anus and rectum: Secondary | ICD-10-CM | POA: Insufficient documentation

## 2017-02-23 DIAGNOSIS — Z800 Family history of malignant neoplasm of digestive organs: Secondary | ICD-10-CM | POA: Insufficient documentation

## 2017-02-23 DIAGNOSIS — K219 Gastro-esophageal reflux disease without esophagitis: Secondary | ICD-10-CM | POA: Insufficient documentation

## 2017-02-24 ENCOUNTER — Ambulatory Visit (INDEPENDENT_AMBULATORY_CARE_PROVIDER_SITE_OTHER): Payer: Medicaid Other | Admitting: *Deleted

## 2017-02-24 ENCOUNTER — Telehealth: Payer: Self-pay | Admitting: *Deleted

## 2017-02-24 DIAGNOSIS — I63011 Cerebral infarction due to thrombosis of right vertebral artery: Secondary | ICD-10-CM

## 2017-02-24 DIAGNOSIS — Z79899 Other long term (current) drug therapy: Secondary | ICD-10-CM

## 2017-02-24 NOTE — Telephone Encounter (Signed)
SPOKE TO PT DAUGHTER ABOUT LABS RESULTS AND REPEAT BMET ON 03-08-17 BEFORE  BLOOD PRESSURE VISIT WITH PHARM D.

## 2017-02-27 ENCOUNTER — Telehealth: Payer: Self-pay | Admitting: *Deleted

## 2017-02-27 LAB — CUP PACEART REMOTE DEVICE CHECK
Implantable Pulse Generator Implant Date: 20161111
MDC IDC SESS DTM: 20180602044838

## 2017-02-27 NOTE — Progress Notes (Signed)
[facility] Summary Report / Loop Recorder 

## 2017-02-27 NOTE — Telephone Encounter (Signed)
Unable to reach patient at both numbers listed.  LM for daughter, Aida Puffer [facility]), requesting call back.  Presenting rhythm on summary report from 02/25/17 is SVT at 139bpm.  Will determine if patient is taking diltiazem as instructed and if symptomatic with episode.

## 2017-02-28 NOTE — Telephone Encounter (Signed)
Called the patient. She doesn't have voice mail set up on her Cell phone yet. I could not leave a message. I called the Home phone, no answering machine.

## 2017-02-28 NOTE — Telephone Encounter (Signed)
Spoke to patient. Advised she can stop her Plavix on 6-15 and resume it on 03-16-2017.  The patient verbalized understanding the instructions.

## 2017-03-03 ENCOUNTER — Telehealth: Payer: Self-pay | Admitting: Family Medicine

## 2017-03-03 NOTE — Telephone Encounter (Signed)
Pt. Called to know her pap results. Pt. States the nurse called her to tell her about her lab results but not her pap. Please f/u with pt.

## 2017-03-03 NOTE — Telephone Encounter (Signed)
Patient verified DOB Patient is aware of PAP showing no malignancy. Patient had no further questions at this time.

## 2017-03-08 ENCOUNTER — Ambulatory Visit: Payer: Medicaid Other

## 2017-03-08 ENCOUNTER — Other Ambulatory Visit: Payer: Medicaid Other

## 2017-03-08 ENCOUNTER — Telehealth: Payer: Self-pay | Admitting: Cardiology

## 2017-03-08 NOTE — Progress Notes (Deleted)
Patient ID: Lisa Anderson                 DOB: 17-Dec-1955                      MRN: [id number]     HPI: Lamoine Fredricksen is a 61 y.o. female patient of Dr. Rayann Heman who presents today for hypertension evaluation. PMH includes CVA, TIA's w/ILR, SVT, Migraines, asthma, chronic CHF (diastolic), HTN, HLD, breast cancer tx surgically, DM. She was recently seen by Tommye Standard, PA and no medication changes were made as pressure was borderline. She will need strict control given history of aneurysm.    Current HTN meds:  Hydralazine 50mg  TID Furosemide 60mg  daily Diltiazem CD 360mg  daily  Previously tried: amlodipine, lisinopril/HCTZ  BP goal: <130/80  Family History: Asthma in her father; Cancer in her mother; Diabetes in her mother; Hyperlipidemia in her mother; Hypertension in her mother; Rheum arthritis in her father; Stroke in her father; Thyroid disease in her brother and father.  Social History: The patient  reports that she has been smoking.  She has been smoking about 0.50 packs per day. She has never used smokeless tobacco. She reports that she drinks about 3.6 oz of alcohol per week . She reports that she uses drugs, including "Crack" cocaine.   Diet:   Exercise:   Home BP readings:    Wt Readings from Last 3 Encounters:  02/17/17 157 lb (71.2 kg)  02/15/17 159 lb (72.1 kg)  02/08/17 161 lb (73 kg)   BP Readings from Last 3 Encounters:  02/17/17 124/70  02/15/17 (!) 144/76  02/08/17 118/68   Pulse Readings from Last 3 Encounters:  02/17/17 92  02/15/17 94  02/08/17 93    Renal function: CrCl cannot be calculated (Unknown ideal weight.).  Past Medical History:  Diagnosis Date  . Asthma   . Cancer ([place])    breast  . Chronic congestive heart failure with left ventricular diastolic dysfunction ([place])   . Diabetes mellitus   . Hyperlipidemia   . Hypertension   . Stroke [facility])     Current Outpatient Prescriptions on File Prior to Visit  Medication Sig Dispense  Refill  . albuterol (PROVENTIL HFA;VENTOLIN HFA) 108 (90 Base) MCG/ACT inhaler Inhale 2 puffs into the lungs every 6 (six) hours as needed for wheezing or shortness of breath. Shortness of breath 1 Inhaler 3  . aspirin EC 325 MG tablet Take 325 mg by mouth daily.    . beclomethasone (QVAR) 80 MCG/ACT inhaler Inhale 1 puff into the lungs 2 (two) times daily. Rinse mouth with water after use to reduce aftertaste and incidence of candidiasis. Do not swallow.    . clopidogrel (PLAVIX) 75 MG tablet Take 1 tablet (75 mg total) by mouth daily. 90 tablet 3  . diltiazem (CARDIZEM CD) 360 MG 24 hr capsule Take 1 capsule (360 mg total) by mouth daily. 90 capsule 3  . DULoxetine (CYMBALTA) 60 MG capsule Take 1 capsule (60 mg total) by mouth daily. 60 capsule 3  . escitalopram (LEXAPRO) 20 MG tablet Take 1 tablet (20 mg total) by mouth daily. 30 tablet 3  . folic acid (FOLVITE) 1 MG tablet Take 1 tablet (1 mg total) by mouth daily.    . furosemide (LASIX) 40 MG tablet Take 1.5 tablets (60 mg total) by mouth daily. 90 tablet 1  . glipiZIDE (GLUCOTROL) 5 MG tablet Take 1 tablet (5 mg total) by mouth 2 (two) times daily  before a meal. 60 tablet 3  . hydrALAZINE (APRESOLINE) 50 MG tablet Take 1 tablet (50 mg total) by mouth 3 (three) times daily. 270 tablet 3  . insulin aspart (NOVOLOG) 100 UNIT/ML injection Sliding scale CBG 70 - 120: 0 units CBG 121 - 150: 1 unit,  CBG 151 - 200: 2 units,  CBG 201 - 250: 3 units,  CBG 251 - 300: 5 units,  CBG 301 - 350: 7 units,  CBG 351 - 400: 9 units   CBG > 400: 9 units and notify your MD 1 vial 12  . insulin glargine (LANTUS) 100 UNIT/ML injection Inject 0.15 mLs (15 Units total) into the skin at bedtime. 10 mL 3  . metFORMIN (GLUCOPHAGE) 1000 MG tablet Take 1 tablet (1,000 mg total) by mouth 2 (two) times daily with a meal. 60 tablet 3  . potassium chloride SA (K-DUR,KLOR-CON) 20 MEQ tablet Take 1 tablet (20 mEq total) by mouth daily. 90 tablet 3  . rosuvastatin (CRESTOR) 20  MG tablet Take 2 tablets (40 mg total) by mouth daily. 180 tablet 3  . traZODone (DESYREL) 150 MG tablet Take 1 tablet (150 mg total) by mouth at bedtime. 30 tablet 3   No current facility-administered medications on file prior to visit.     Allergies  Allergen Reactions  . Prednisone Hives and Other (See Comments)    Made her "crazy", suicidal  . Benicar [Olmesartan] Other (See Comments)    Did not work at all for patient  . Celexa [Citalopram] Other (See Comments)    Made patient feel crazy, fluoxetine is fine  . Effexor [Venlafaxine] Other (See Comments)    Made patient feel crazy  . Shrimp [Shellfish Allergy] Hives and Swelling    There were no vitals taken for this visit.   Assessment/Plan: Hypertension:    Thank you, Lelan Pons. Patterson Hammersmith, [facility]  03/08/2017 8:09 AM

## 2017-03-08 NOTE — Telephone Encounter (Signed)
LMOVM requesting that pt send manual transmission b/c home monitor has not updated in at least 14 days.    

## 2017-03-09 NOTE — Telephone Encounter (Signed)
LMOVM for daughter requesting call back.

## 2017-03-14 ENCOUNTER — Telehealth: Payer: Self-pay | Admitting: Internal Medicine

## 2017-03-14 NOTE — Telephone Encounter (Signed)
Patient canceled procedure for tomorrow 6.20.18 due to not doing the prep and not having a driver to bring her. Pt resch to 8.22.18.

## 2017-03-14 NOTE — Telephone Encounter (Signed)
OK no charge ?

## 2017-03-15 ENCOUNTER — Encounter: Payer: Medicaid Other | Admitting: Internal Medicine

## 2017-03-27 ENCOUNTER — Ambulatory Visit (INDEPENDENT_AMBULATORY_CARE_PROVIDER_SITE_OTHER): Payer: Self-pay | Admitting: *Deleted

## 2017-03-27 DIAGNOSIS — I63011 Cerebral infarction due to thrombosis of right vertebral artery: Secondary | ICD-10-CM

## 2017-03-27 NOTE — Progress Notes (Signed)
[facility] Summary Report / Loop Recorder 

## 2017-04-06 LAB — CUP PACEART REMOTE DEVICE CHECK
MDC IDC PG IMPLANT DT: 20161111
MDC IDC SESS DTM: 20180702093935

## 2017-04-13 ENCOUNTER — Encounter (HOSPITAL_COMMUNITY): Payer: Self-pay | Admitting: Emergency Medicine

## 2017-04-13 ENCOUNTER — Emergency Department (HOSPITAL_COMMUNITY): Payer: Self-pay

## 2017-04-13 ENCOUNTER — Emergency Department (HOSPITAL_COMMUNITY)
Admission: EM | Admit: 2017-04-13 | Discharge: 2017-04-13 | Disposition: A | Discharge status: Home / Self Care | Payer: Self-pay | Attending: Emergency Medicine | Admitting: Emergency Medicine

## 2017-04-13 DIAGNOSIS — M5416 Radiculopathy, lumbar region: Secondary | ICD-10-CM | POA: Insufficient documentation

## 2017-04-13 DIAGNOSIS — M5410 Radiculopathy, site unspecified: Secondary | ICD-10-CM

## 2017-04-13 DIAGNOSIS — M5432 Sciatica, left side: Secondary | ICD-10-CM

## 2017-04-13 DIAGNOSIS — M5126 Other intervertebral disc displacement, lumbar region: Secondary | ICD-10-CM | POA: Insufficient documentation

## 2017-04-13 DIAGNOSIS — M5442 Lumbago with sciatica, left side: Secondary | ICD-10-CM | POA: Insufficient documentation

## 2017-04-13 LAB — URINALYSIS, ROUTINE W REFLEX MICROSCOPIC
BILIRUBIN URINE: NEGATIVE
Bacteria, UA: NONE SEEN
Hgb urine dipstick: NEGATIVE
KETONES UR: NEGATIVE mg/dL
LEUKOCYTES UA: NEGATIVE
NITRITE: NEGATIVE
PH: 7 (ref 5.0–8.0)
PROTEIN: NEGATIVE mg/dL
Specific Gravity, Urine: 1.003 — ABNORMAL LOW (ref 1.005–1.030)
WBC, UA: NONE SEEN WBC/hpf (ref 0–5)

## 2017-04-13 MED ORDER — LORAZEPAM 2 MG/ML IJ SOLN
2.0000 mg | Freq: Once | INTRAMUSCULAR | Status: AC
  Administered 2017-04-13: 2 mg via INTRAMUSCULAR
  Filled 2017-04-13: qty 1

## 2017-04-13 MED ORDER — HYDROMORPHONE HCL 1 MG/ML IJ SOLN
1.0000 mg | Freq: Once | INTRAMUSCULAR | Status: AC
  Administered 2017-04-13: 1 mg via INTRAMUSCULAR
  Filled 2017-04-13: qty 1

## 2017-04-13 MED ORDER — PREDNISONE 20 MG PO TABS
60.0000 mg | ORAL_TABLET | Freq: Once | ORAL | Status: AC
  Administered 2017-04-13: 60 mg via ORAL
  Filled 2017-04-13: qty 3

## 2017-04-13 MED ORDER — OXYCODONE-ACETAMINOPHEN 5-325 MG PO TABS: 1.0000 | ORAL_TABLET | ORAL | 0 refills | Status: DC | PRN

## 2017-04-13 MED ORDER — PREDNISONE 20 MG PO TABS: ORAL_TABLET | ORAL | 0 refills | Status: DC

## 2017-04-13 MED ORDER — KETOROLAC TROMETHAMINE 60 MG/2ML IM SOLN
30.0000 mg | Freq: Once | INTRAMUSCULAR | Status: AC
  Administered 2017-04-13: 30 mg via INTRAMUSCULAR
  Filled 2017-04-13: qty 2

## 2017-04-13 MED ORDER — OXYCODONE-ACETAMINOPHEN 5-325 MG PO TABS
2.0000 | ORAL_TABLET | Freq: Once | ORAL | Status: AC
  Administered 2017-04-13: 2 via ORAL
  Filled 2017-04-13: qty 2

## 2017-04-13 MED ORDER — CYCLOBENZAPRINE HCL 10 MG PO TABS: 10.0000 mg | ORAL_TABLET | Freq: Two times a day (BID) | ORAL | 0 refills | Status: DC | PRN

## 2017-04-13 NOTE — ED Provider Notes (Signed)
[place] DEPT Provider Note   CSN: [id number] Arrival date & time: 04/13/17  1118     History   Chief Complaint Chief Complaint  Patient presents with  . Back Pain  . Leg Pain    HPI Lisa Anderson is a 61 y.o. female.  HPI Patient ports that a couple weeks ago she was lifting a patient and got pain in her lower back that felt more like a strain. She reports however this is continue to worsen and now she has a severe pain in her left buttocks that radiates down her leg to her calf. She reports it feels like she has a continuous cramp in her calf. The pain in her lower back and buttock is worse with certain movements. She feels that the leg has become a little slightly numb at the foot and weak with walking. No bowel or bladder dysfunction. No fever no chills. She has been taking Aleve and applying patches without any relief. Past Medical History:  Diagnosis Date  . Asthma   . Cancer ([place])    breast  . Chronic congestive heart failure with left ventricular diastolic dysfunction ([place])   . Diabetes mellitus   . Hyperlipidemia   . Hypertension   . Stroke [facility])     Patient Active Problem List   Diagnosis Date Noted  . Family history of colon cancer 02/23/2017  . Rectal bleeding 02/23/2017  . Gastroesophageal reflux disease without esophagitis 02/23/2017  . Intractable vomiting with nausea 02/23/2017  . Abnormal thyroid blood test 01/02/2017  . Insomnia 01/02/2017  . Anxiety 01/02/2017  . IDDM (insulin dependent diabetes mellitus) ([place])   . Asthma 11/12/2015  . Acute respiratory failure ([place]) 11/11/2015  . TIA (transient ischemic attack) 11/01/2015  . Chronic diastolic CHF (congestive heart failure) ([place]) 11/01/2015  . Acute focal neurological deficit 11/01/2015  . Type 2 diabetes mellitus with circulatory disorder ([place])   . HLD (hyperlipidemia)   . Palpitations   . Stroke ([place]) 08/05/2015  . Hypertension 08/05/2015  . Stroke due to embolism of right posterior  cerebral artery ([place]) 08/05/2015  . Diabetes mellitus 08/05/2015  . Alcohol dependency ([place]) 08/10/2013  . Cocaine abuse 08/08/2013  . Alcohol abuse 08/08/2013  . MDD (major depressive disorder) 08/08/2013    Past Surgical History:  Procedure Laterality Date  . BREAST LUMPECTOMY    . EP IMPLANTABLE DEVICE N/A 08/07/2015   Procedure: Loop Recorder Insertion;  Surgeon: Thompson Grayer, MD;  Location: [place] CV LAB;  Service: Cardiovascular;  Laterality: N/A;  . TEE WITHOUT CARDIOVERSION N/A 08/07/2015   Procedure: TRANSESOPHAGEAL ECHOCARDIOGRAM (TEE);  Surgeon: Sueanne Margarita, MD;  Location[facility] ENDOSCOPY;  Service: Cardiovascular;  Laterality: N/A;  . TONSILLECTOMY      OB History    No data available       Home Medications    Prior to Admission medications   Medication Sig Start Date End Date Taking? Authorizing Provider  albuterol (PROVENTIL HFA;VENTOLIN HFA) 108 (90 Base) MCG/ACT inhaler Inhale 2 puffs into the lungs every 6 (six) hours as needed for wheezing or shortness of breath. Shortness of breath 01/02/17  Yes Arnoldo Morale, MD  aspirin EC 81 MG tablet Take 162 mg by mouth daily.    Yes [provider]  beclomethasone (QVAR) 80 MCG/ACT inhaler Inhale 1 puff into the lungs 2 (two) times daily. Rinse mouth with water after use to reduce aftertaste and incidence of candidiasis. Do not swallow. 08/04/16  Yes [provider]  clopidogrel (PLAVIX)  75 MG tablet Take 1 tablet (75 mg total) by mouth daily. 01/04/17  Yes Arnoldo Morale, MD  diltiazem (CARDIZEM CD) 360 MG 24 hr capsule Take 1 capsule (360 mg total) by mouth daily. 12/12/16  Yes Allred, Jeneen Rinks, MD  DULoxetine (CYMBALTA) 60 MG capsule Take 1 capsule (60 mg total) by mouth daily. 01/02/17  Yes Arnoldo Morale, MD  escitalopram (LEXAPRO) 20 MG tablet Take 1 tablet (20 mg total) by mouth daily. 02/08/17  Yes Arnoldo Morale, MD  furosemide (LASIX) 40 MG tablet Take 1.5 tablets (60 mg total) by mouth daily. 02/15/17  05/16/17 Yes Baldwin Jamaica, PA-C  glipiZIDE (GLUCOTROL) 5 MG tablet Take 1 tablet (5 mg total) by mouth 2 (two) times daily before a meal. 01/02/17  Yes Arnoldo Morale, MD  hydrALAZINE (APRESOLINE) 50 MG tablet Take 1 tablet (50 mg total) by mouth 3 (three) times daily. 12/12/16  Yes Allred, Jeneen Rinks, MD  insulin aspart (NOVOLOG) 100 UNIT/ML injection Sliding scale CBG 70 - 120: 0 units CBG 121 - 150: 1 unit,  CBG 151 - 200: 2 units,  CBG 201 - 250: 3 units,  CBG 251 - 300: 5 units,  CBG 301 - 350: 7 units,  CBG 351 - 400: 9 units   CBG > 400: 9 units and notify your MD 01/02/17  Yes Arnoldo Morale, MD  insulin glargine (LANTUS) 100 UNIT/ML injection Inject 0.15 mLs (15 Units total) into the skin at bedtime. 01/02/17  Yes Arnoldo Morale, MD  metFORMIN (GLUCOPHAGE) 1000 MG tablet Take 1 tablet (1,000 mg total) by mouth 2 (two) times daily with a meal. 01/02/17  Yes Amao, Charlane Ferretti, MD  naproxen sodium (ANAPROX) 220 MG tablet Take 440 mg by mouth 2 (two) times daily with a meal.   Yes [provider]  potassium chloride SA (K-DUR,KLOR-CON) 20 MEQ tablet Take 1 tablet (20 mEq total) by mouth daily. 12/12/16  Yes Allred, Jeneen Rinks, MD  rosuvastatin (CRESTOR) 20 MG tablet Take 2 tablets (40 mg total) by mouth daily. 12/12/16  Yes Allred, Jeneen Rinks, MD  traZODone (DESYREL) 150 MG tablet Take 1 tablet (150 mg total) by mouth at bedtime. 01/02/17  Yes Arnoldo Morale, MD  folic acid (FOLVITE) 1 MG tablet Take 1 tablet (1 mg total) by mouth daily. Patient not taking: Reported on 04/13/2017 11/02/15   Geradine Girt, DO    Family History Family History  Problem Relation Age of Onset  . Diabetes Mother   . Hyperlipidemia Mother   . Hypertension Mother   . Breast cancer Mother   . Rheum arthritis Father   . Asthma Father   . Stroke Father   . Thyroid disease Father   . Thyroid disease Brother   . Colon cancer Brother        in his 26s  . Breast cancer Sister     Social History Social History  Substance Use Topics    . Smoking status: Current Every Day Smoker    Packs/day: 0.50  . Smokeless tobacco: Never Used     Comment: none in the past 2 days  . Alcohol use 3.6 oz/week    6 Cans of beer per week     Comment: last time in September 2017     Allergies   Prednisone; Benicar [olmesartan]; Celexa [citalopram]; Effexor [venlafaxine]; and Shrimp [shellfish allergy]   Review of Systems Review of Systems 10 Systems reviewed and are negative for acute change except as noted in the HPI.   Physical Exam Updated Vital  Signs BP 138/72   Pulse 72   Temp 97.7 F (36.5 C) (Oral)   Resp 18   Ht 5\' 5"  (1.651 m)   Wt 72.6 kg (160 lb)   SpO2 99%   BMI 26.63 kg/m   Physical Exam  Constitutional: She is oriented to person, place, and time. She appears well-developed and well-nourished. No distress.  Patient has moderate central obesity. She is alert and appropriate. No respiratory distress.  HENT:  Head: Normocephalic and atraumatic.  Eyes: Conjunctivae and EOM are normal.  Neck: Neck supple.  Cardiovascular: Normal rate and regular rhythm.   No murmur heard. Pulmonary/Chest: Effort normal and breath sounds normal. No respiratory distress.  Abdominal: Soft. She exhibits no distension. There is no tenderness. There is no guarding.  Musculoskeletal: Normal range of motion. She exhibits no edema.  Patient has moderate reproducible pain in the sciatic outlet over the left buttock. She does have intact range of motion at the hip and the knee. No effusion at the joints. Strength is intact for flexion and extension at the hip and the knee of the left lower extremity. She does not have any pain to compression of the calf. No edema of the leg.  Neurological: She is alert and oriented to person, place, and time. No cranial nerve deficit. She exhibits normal muscle tone. Coordination normal.  Skin: Skin is warm and dry.  Psychiatric: She has a normal mood and affect.  Nursing note and vitals  reviewed.    ED Treatments / Results  Labs (all labs ordered are listed, but only abnormal results are displayed) Labs Reviewed  URINALYSIS, ROUTINE W REFLEX MICROSCOPIC - Abnormal; Notable for the following:       Result Value   Color, Urine STRAW (*)    Specific Gravity, Urine 1.003 (*)    Glucose, UA >=500 (*)    Squamous Epithelial / LPF 0-5 (*)    All other components within normal limits    EKG  EKG Interpretation None       Radiology Mr Lumbar Spine Wo Contrast  Result Date: 04/13/2017 CLINICAL DATA:  Initial evaluation for acute left lower back and buttock, radiating down left leg. EXAM: MRI LUMBAR SPINE WITHOUT CONTRAST TECHNIQUE: Multiplanar, multisequence MR imaging of the lumbar spine was performed. No intravenous contrast was administered. COMPARISON:  None available. FINDINGS: Segmentation: Transitional lumbosacral anatomy. Lowest rib-bearing vertebral body is labeled T12. There is partial lumbar levels a shin of the S1 segment. Alignment: Vertebral bodies normally aligned with preservation of the normal lumbar lordosis. No listhesis. Vertebrae: Vertebral body heights are maintained. No evidence for acute or chronic fracture. Mild reactive endplate changes noted about the L5-S1 interspace. Few small benign hemangiomas noted. No worrisome osseous lesions. Signal intensity within the vertebral body bone marrow is normal. Conus medullaris: Extends to the L2 level and appears normal. Paraspinal and other soft tissues: Paraspinous soft tissues within normal limits. Tiny T2 hyperintense simple cysts noted within left kidney. Visualized visceral structures are otherwise unremarkable. Disc levels: L1-2: Mild circumferential disc bulge. No focal disc protrusion. No canal or foraminal stenosis. L2-3: Mild circumferential disc bulge. Mild facet and ligamentum flavum hypertrophy. No canal or foraminal stenosis. L3-4: Mild annular disc bulge. Mild facet and ligamentum flavum hypertrophy.  No significant canal or neural foraminal stenosis. L4-5: Mild annular disc bulge. Superimposed broad left extraforaminal disc protrusion, closely approximating the exiting left L4 nerve root (series 7, image 22). No frank neural impingement. Mild facet and flavum hypertrophy. No significant  canal stenosis. Mild left L4 foraminal narrowing. Right neural foramen widely patent. L5-S1: Moderate-size left subarticular disc extrusion with inferior migration (series 4, image 9). Inferior migration extends 2.7 cm inferior to the parent L5-S1 disc to the level of the S1-2 disc. Extruded disc material encroaches upon the left lateral recess, impinging upon the descending left S1 nerve root. Disc may affect the left S2 nerve root as well. No significant canal stenosis. Foramina remain patent. IMPRESSION: 1. Moderate size left subarticular disc extrusion at L5-S1, impinging upon the descending left S1 nerve root. 2. Broad left extraforaminal disc protrusion at L4-5, closely approximating the exiting left L4 nerve root without frank neural impingement. 3. Transitional lumbosacral anatomy as above. Careful correlation with numbering system on this exam recommended prior to any potential future planned surgical intervention. Electronically Signed   By: Jeannine Boga M.D.   On: 04/13/2017 14:54    Procedures Procedures (including critical care time)  Medications Ordered in ED Medications  predniSONE (DELTASONE) tablet 60 mg (not administered)  oxyCODONE-acetaminophen (PERCOCET/ROXICET) 5-325 MG per tablet 2 tablet (not administered)  HYDROmorphone (DILAUDID) injection 1 mg (1 mg Intramuscular Given 04/13/17 1231)  ketorolac (TORADOL) injection 30 mg (30 mg Intramuscular Given 04/13/17 1231)  LORazepam (ATIVAN) injection 2 mg (2 mg Intramuscular Given 04/13/17 1352)     Initial Impression / Assessment and Plan / ED Course  I have reviewed the triage vital signs and the nursing notes.  Pertinent labs & imaging  results that were available during my care of the patient were reviewed by me and considered in my medical decision making (see chart for details).     Consult: Review Dr. Cyndy Freeze. Will see patient as outpatient  Final Clinical Impressions(s) / ED Diagnoses   Final diagnoses:  Radiculopathy  Sciatica of left side  HNP (herniated nucleus pulposus), lumbar  Patient presents with history consistent with disc herniation. This is confirmed on MRI. Patient is ambulatory and does have intact strength. Surgical management with Dr. Cyndy Freeze versus medical management were discussed and offered. At this time, patient would like to try medical management area she reports her allergy to prednisone is not severe. She reports it just made her feel funny. Will have the patient try steroid taper, muscle relaxers and Percocet for pain control. Patient reports she has financial difficulty paying for medications, caseworker consult it.  New Prescriptions New Prescriptions   No medications on file     Charlesetta Shanks, MD 04/13/17 1547

## 2017-04-13 NOTE — ED Notes (Signed)
Pt reports claustrophobia, Dr. Johnney Killian made aware.

## 2017-04-13 NOTE — ED Triage Notes (Signed)
Patient c/o left lower back/buttock pain that radiates down left leg. Patient reports it started couple weeks ago when she was pulling up a patient but pain would go away but over the past 4 days pain has gotten worse. Patient reports no relief with taking Aleve and pain patches.

## 2017-04-17 ENCOUNTER — Telehealth: Payer: Self-pay

## 2017-04-19 NOTE — Telephone Encounter (Signed)
Returned call to patient and left message that I would call her back on Fri to discuss issue.

## 2017-04-20 ENCOUNTER — Telehealth: Payer: Self-pay | Admitting: Cardiology

## 2017-04-20 NOTE — Telephone Encounter (Signed)
LMOVM requesting that pt send manual transmission b/c home monitor has not updated in at least 14 days.    

## 2017-04-24 NOTE — Telephone Encounter (Signed)
Left message for patient to call back  

## 2017-04-25 NOTE — Telephone Encounter (Signed)
Attempted to call patient again. There was no answer. Left message for patient to call back.

## 2017-04-26 ENCOUNTER — Ambulatory Visit (INDEPENDENT_AMBULATORY_CARE_PROVIDER_SITE_OTHER): Payer: Self-pay | Admitting: *Deleted

## 2017-04-26 DIAGNOSIS — I63011 Cerebral infarction due to thrombosis of right vertebral artery: Secondary | ICD-10-CM

## 2017-04-26 NOTE — Progress Notes (Signed)
[facility] Summary Report / Loop Recorder 

## 2017-04-28 ENCOUNTER — Encounter: Payer: Self-pay | Admitting: Cardiology

## 2017-05-04 NOTE — Telephone Encounter (Signed)
Left message if she still needsd assistance to please call to discuss.  We have tried multiple tomes and left multiple messages.

## 2017-05-08 LAB — CUP PACEART REMOTE DEVICE CHECK
Implantable Pulse Generator Implant Date: 20161111
MDC IDC SESS DTM: 20180801141131

## 2017-05-15 ENCOUNTER — Telehealth: Payer: Self-pay | Admitting: *Deleted

## 2017-05-15 NOTE — Telephone Encounter (Signed)
I called patient to check if she has held her Plavix for her procedures on Wednesday.  She had changed her appointment and I wanted to be sure she has stopped her Plavix.  No answer/left message for patient to call directly into admitting.

## 2017-05-16 ENCOUNTER — Ambulatory Visit: Payer: Medicaid Other | Admitting: Family Medicine

## 2017-05-17 ENCOUNTER — Encounter: Payer: Self-pay | Admitting: Internal Medicine

## 2017-05-19 ENCOUNTER — Encounter (HOSPITAL_COMMUNITY): Payer: Self-pay

## 2017-05-19 ENCOUNTER — Inpatient Hospital Stay (HOSPITAL_COMMUNITY)
Admission: EM | Admit: 2017-05-19 | Discharge: 2017-05-22 | DRG: 918 | Disposition: A | Discharge status: Other Acute Inpatient Hospital | Payer: Self-pay | Attending: Family Medicine | Admitting: Family Medicine

## 2017-05-19 DIAGNOSIS — Z888 Allergy status to other drugs, medicaments and biological substances status: Secondary | ICD-10-CM

## 2017-05-19 DIAGNOSIS — T50901A Poisoning by unspecified drugs, medicaments and biological substances, accidental (unintentional), initial encounter: Secondary | ICD-10-CM | POA: Diagnosis present

## 2017-05-19 DIAGNOSIS — F419 Anxiety disorder, unspecified: Secondary | ICD-10-CM | POA: Diagnosis present

## 2017-05-19 DIAGNOSIS — E119 Type 2 diabetes mellitus without complications: Secondary | ICD-10-CM

## 2017-05-19 DIAGNOSIS — E1122 Type 2 diabetes mellitus with diabetic chronic kidney disease: Secondary | ICD-10-CM | POA: Diagnosis present

## 2017-05-19 DIAGNOSIS — I10 Essential (primary) hypertension: Secondary | ICD-10-CM | POA: Diagnosis present

## 2017-05-19 DIAGNOSIS — F1420 Cocaine dependence, uncomplicated: Secondary | ICD-10-CM

## 2017-05-19 DIAGNOSIS — I959 Hypotension, unspecified: Secondary | ICD-10-CM | POA: Diagnosis present

## 2017-05-19 DIAGNOSIS — I5032 Chronic diastolic (congestive) heart failure: Secondary | ICD-10-CM | POA: Diagnosis present

## 2017-05-19 DIAGNOSIS — R45851 Suicidal ideations: Secondary | ICD-10-CM | POA: Diagnosis present

## 2017-05-19 DIAGNOSIS — Z794 Long term (current) use of insulin: Secondary | ICD-10-CM

## 2017-05-19 DIAGNOSIS — F329 Major depressive disorder, single episode, unspecified: Secondary | ICD-10-CM | POA: Diagnosis present

## 2017-05-19 DIAGNOSIS — Z853 Personal history of malignant neoplasm of breast: Secondary | ICD-10-CM

## 2017-05-19 DIAGNOSIS — E785 Hyperlipidemia, unspecified: Secondary | ICD-10-CM | POA: Diagnosis present

## 2017-05-19 DIAGNOSIS — I952 Hypotension due to drugs: Secondary | ICD-10-CM

## 2017-05-19 DIAGNOSIS — T50902A Poisoning by unspecified drugs, medicaments and biological substances, intentional self-harm, initial encounter: Secondary | ICD-10-CM

## 2017-05-19 DIAGNOSIS — Z7982 Long term (current) use of aspirin: Secondary | ICD-10-CM

## 2017-05-19 DIAGNOSIS — Z79899 Other long term (current) drug therapy: Secondary | ICD-10-CM

## 2017-05-19 DIAGNOSIS — F1424 Cocaine dependence with cocaine-induced mood disorder: Secondary | ICD-10-CM

## 2017-05-19 DIAGNOSIS — I4581 Long QT syndrome: Secondary | ICD-10-CM | POA: Diagnosis present

## 2017-05-19 DIAGNOSIS — J45909 Unspecified asthma, uncomplicated: Secondary | ICD-10-CM | POA: Diagnosis present

## 2017-05-19 DIAGNOSIS — T43212A Poisoning by selective serotonin and norepinephrine reuptake inhibitors, intentional self-harm, initial encounter: Principal | ICD-10-CM | POA: Diagnosis present

## 2017-05-19 DIAGNOSIS — F1410 Cocaine abuse, uncomplicated: Secondary | ICD-10-CM | POA: Diagnosis present

## 2017-05-19 DIAGNOSIS — Z8673 Personal history of transient ischemic attack (TIA), and cerebral infarction without residual deficits: Secondary | ICD-10-CM

## 2017-05-19 DIAGNOSIS — Z7902 Long term (current) use of antithrombotics/antiplatelets: Secondary | ICD-10-CM

## 2017-05-19 DIAGNOSIS — F17210 Nicotine dependence, cigarettes, uncomplicated: Secondary | ICD-10-CM | POA: Diagnosis present

## 2017-05-19 DIAGNOSIS — Z915 Personal history of self-harm: Secondary | ICD-10-CM

## 2017-05-19 DIAGNOSIS — E1159 Type 2 diabetes mellitus with other circulatory complications: Secondary | ICD-10-CM | POA: Diagnosis present

## 2017-05-19 DIAGNOSIS — F332 Major depressive disorder, recurrent severe without psychotic features: Secondary | ICD-10-CM | POA: Diagnosis present

## 2017-05-19 DIAGNOSIS — I130 Hypertensive heart and chronic kidney disease with heart failure and stage 1 through stage 4 chronic kidney disease, or unspecified chronic kidney disease: Secondary | ICD-10-CM | POA: Diagnosis present

## 2017-05-19 DIAGNOSIS — N183 Chronic kidney disease, stage 3 (moderate): Secondary | ICD-10-CM | POA: Diagnosis present

## 2017-05-19 LAB — COMPREHENSIVE METABOLIC PANEL WITH GFR
ALT: 16 U/L (ref 14–54)
AST: 17 U/L (ref 15–41)
Albumin: 3.6 g/dL (ref 3.5–5.0)
Alkaline Phosphatase: 89 U/L (ref 38–126)
Anion gap: 9 (ref 5–15)
BUN: 13 mg/dL (ref 6–20)
CO2: 27 mmol/L (ref 22–32)
Calcium: 8.7 mg/dL — ABNORMAL LOW (ref 8.9–10.3)
Chloride: 103 mmol/L (ref 101–111)
Creatinine, Ser: 1.04 mg/dL — ABNORMAL HIGH (ref 0.44–1.00)
GFR calc Af Amer: >60 mL/min
GFR calc non Af Amer: 58 mL/min — ABNORMAL LOW
Glucose, Bld: 292 mg/dL — ABNORMAL HIGH (ref 65–99)
Potassium: 3.4 mmol/L — ABNORMAL LOW (ref 3.5–5.1)
Sodium: 139 mmol/L (ref 135–145)
Total Bilirubin: 0.5 mg/dL (ref 0.3–1.2)
Total Protein: 7 g/dL (ref 6.5–8.1)

## 2017-05-19 LAB — GLUCOSE, CAPILLARY
GLUCOSE-CAPILLARY: 117 mg/dL — AB (ref 65–99)
Glucose-Capillary: 155 mg/dL — ABNORMAL HIGH (ref 65–99)

## 2017-05-19 LAB — CBC WITH DIFFERENTIAL/PLATELET
Basophils Absolute: 0 K/uL (ref 0.0–0.1)
Basophils Relative: 0 %
Eosinophils Absolute: 0.4 K/uL (ref 0.0–0.7)
Eosinophils Relative: 4 %
HCT: 36.3 % (ref 36.0–46.0)
Hemoglobin: 11.7 g/dL — ABNORMAL LOW (ref 12.0–15.0)
Lymphocytes Relative: 16 %
Lymphs Abs: 1.6 K/uL (ref 0.7–4.0)
MCH: 28.3 pg (ref 26.0–34.0)
MCHC: 32.2 g/dL (ref 30.0–36.0)
MCV: 87.7 fL (ref 78.0–100.0)
Monocytes Absolute: 0.7 K/uL (ref 0.1–1.0)
Monocytes Relative: 7 %
Neutro Abs: 7.2 K/uL (ref 1.7–7.7)
Neutrophils Relative %: 73 %
Platelets: 303 K/uL (ref 150–400)
RBC: 4.14 MIL/uL (ref 3.87–5.11)
RDW: 13.5 % (ref 11.5–15.5)
WBC: 9.8 K/uL (ref 4.0–10.5)

## 2017-05-19 LAB — BASIC METABOLIC PANEL
ANION GAP: 3 — AB (ref 5–15)
BUN: 10 mg/dL (ref 6–20)
CHLORIDE: 117 mmol/L — AB (ref 101–111)
CO2: 26 mmol/L (ref 22–32)
Calcium: 7.5 mg/dL — ABNORMAL LOW (ref 8.9–10.3)
Creatinine, Ser: 0.95 mg/dL (ref 0.44–1.00)
GFR calc non Af Amer: >60 mL/min (ref >60)
Glucose, Bld: 185 mg/dL — ABNORMAL HIGH (ref 65–99)
POTASSIUM: 3.5 mmol/L (ref 3.5–5.1)
SODIUM: 146 mmol/L — AB (ref 135–145)

## 2017-05-19 LAB — ACETAMINOPHEN LEVEL: Acetaminophen (Tylenol), Serum: <10 ug/mL — ABNORMAL LOW (ref 10–30)

## 2017-05-19 LAB — RAPID URINE DRUG SCREEN, HOSP PERFORMED
Amphetamines: NOT DETECTED
Barbiturates: NOT DETECTED
Benzodiazepines: NOT DETECTED
Cocaine: POSITIVE — AB
Opiates: NOT DETECTED
Tetrahydrocannabinol: NOT DETECTED

## 2017-05-19 LAB — MRSA PCR SCREENING: MRSA BY PCR: NEGATIVE

## 2017-05-19 LAB — ETHANOL: Alcohol, Ethyl (B): <5 mg/dL (ref <5)

## 2017-05-19 LAB — MAGNESIUM: Magnesium: 1.9 mg/dL (ref 1.7–2.4)

## 2017-05-19 LAB — CK: Total CK: 74 U/L (ref 38–234)

## 2017-05-19 LAB — SALICYLATE LEVEL

## 2017-05-19 MED ORDER — ENOXAPARIN SODIUM 40 MG/0.4ML ~~LOC~~ SOLN
40.0000 mg | SUBCUTANEOUS | Status: DC
  Administered 2017-05-19 – 2017-05-21 (×3): 40 mg via SUBCUTANEOUS
  Filled 2017-05-19 (×3): qty 0.4

## 2017-05-19 MED ORDER — HYDRALAZINE HCL 20 MG/ML IJ SOLN
5.0000 mg | Freq: Four times a day (QID) | INTRAMUSCULAR | Status: DC | PRN
  Administered 2017-05-20: 5 mg via INTRAVENOUS
  Filled 2017-05-19: qty 0.25

## 2017-05-19 MED ORDER — SODIUM CHLORIDE 0.9 % IV BOLUS (SEPSIS)
1000.0000 mL | Freq: Once | INTRAVENOUS | Status: AC
  Administered 2017-05-19: 1000 mL via INTRAVENOUS

## 2017-05-19 MED ORDER — INSULIN ASPART 100 UNIT/ML ~~LOC~~ SOLN
0.0000 [IU] | Freq: Three times a day (TID) | SUBCUTANEOUS | Status: DC
  Administered 2017-05-20: 2 [IU] via SUBCUTANEOUS
  Administered 2017-05-20 – 2017-05-22 (×4): 3 [IU] via SUBCUTANEOUS

## 2017-05-19 MED ORDER — INSULIN GLARGINE 100 UNIT/ML ~~LOC~~ SOLN
15.0000 [IU] | Freq: Every day | SUBCUTANEOUS | Status: DC
  Administered 2017-05-19 – 2017-05-21 (×3): 15 [IU] via SUBCUTANEOUS
  Filled 2017-05-19 (×4): qty 0.15

## 2017-05-19 MED ORDER — CLOPIDOGREL BISULFATE 75 MG PO TABS
75.0000 mg | ORAL_TABLET | Freq: Every day | ORAL | Status: DC
  Administered 2017-05-20 – 2017-05-22 (×3): 75 mg via ORAL
  Filled 2017-05-19 (×3): qty 1

## 2017-05-19 MED ORDER — ASPIRIN EC 81 MG PO TBEC
162.0000 mg | DELAYED_RELEASE_TABLET | Freq: Every day | ORAL | Status: DC
  Administered 2017-05-20 – 2017-05-22 (×3): 162 mg via ORAL
  Filled 2017-05-19 (×3): qty 2

## 2017-05-19 MED ORDER — SODIUM CHLORIDE 0.9 % IV SOLN
Freq: Once | INTRAVENOUS | Status: DC
Start: 2017-05-19 — End: 2017-05-19

## 2017-05-19 MED ORDER — SODIUM CHLORIDE 0.9 % IV SOLN
1000.0000 mL | INTRAVENOUS | Status: DC
  Administered 2017-05-19: 1000 mL via INTRAVENOUS

## 2017-05-19 MED ORDER — ONDANSETRON HCL 4 MG PO TABS: 4.0000 mg | ORAL_TABLET | Freq: Four times a day (QID) | ORAL | Status: DC | PRN

## 2017-05-19 MED ORDER — MAGNESIUM SULFATE 2 GM/50ML IV SOLN
2.0000 g | Freq: Once | INTRAVENOUS | Status: AC
  Administered 2017-05-19: 2 g via INTRAVENOUS
  Filled 2017-05-19: qty 50

## 2017-05-19 MED ORDER — SENNOSIDES-DOCUSATE SODIUM 8.6-50 MG PO TABS
1.0000 | ORAL_TABLET | Freq: Every evening | ORAL | Status: DC | PRN
  Administered 2017-05-21: 1 via ORAL
  Filled 2017-05-19: qty 1

## 2017-05-19 MED ORDER — POTASSIUM CHLORIDE 10 MEQ/100ML IV SOLN
10.0000 meq | Freq: Once | INTRAVENOUS | Status: AC
  Administered 2017-05-19: 10 meq via INTRAVENOUS
  Filled 2017-05-19: qty 100

## 2017-05-19 MED ORDER — KETOROLAC TROMETHAMINE 15 MG/ML IJ SOLN
15.0000 mg | Freq: Four times a day (QID) | INTRAMUSCULAR | Status: DC | PRN
  Administered 2017-05-20: 15 mg via INTRAVENOUS
  Filled 2017-05-19: qty 1

## 2017-05-19 MED ORDER — SODIUM CHLORIDE 0.45 % IV SOLN
INTRAVENOUS | Status: DC
  Administered 2017-05-19 – 2017-05-20 (×2): via INTRAVENOUS

## 2017-05-19 MED ORDER — ONDANSETRON HCL 4 MG/2ML IJ SOLN: 4.0000 mg | Freq: Four times a day (QID) | INTRAMUSCULAR | Status: DC | PRN

## 2017-05-19 NOTE — H&P (Addendum)
History and Physical    Lisa Anderson BDZ:[id number] DOB: [birth year]  DOA: 05/19/2017 PCP: Arnoldo Morale, MD  Patient coming from: Home   Chief Complaint: Suicidal attempt   HPI: Lisa Anderson is a 61 y.o. female with medical history significant of hypertension, diabetes,, stroke and severe depression presented to the emergency department after a suicide attempt. She took 10 pills of 150 mg of trazodone. She reports that she doesn't want to live anymore. This is her third suicide attempt. Patient has not been seeing her psychiatrist or psychologist on a frequent basis. In the ED she is somewhat somnolent but denies chest pain, shortness of breath, dizziness, palpitations, weakness and abdominal pain. ED physician called poison control who recommended to observe patient overnight. Frequent EKG and blood pressure monitoring. Her blood pressure has been fluctuant, but has remained soft in the last few hours.   Review of Systems:   General: no changes in body weight, no fever chills or decrease in energy.  HEENT: no blurry vision, hearing changes or sore throat Respiratory: no dyspnea, coughing, wheezing CV: no chest pain, no palpitations GI: no nausea, vomiting, abdominal pain, diarrhea, constipation GU: no dysuria, burning on urination, increased urinary frequency, hematuria  Ext:. No deformities,  Neuro: no unilateral weakness, numbness, or tingling, no vision change or hearing loss Skin: No rashes, lesions or wounds. MSK: No muscle spasm, no deformity, no limitation of range of movement in spin Heme: No easy bruising.  Travel history: No recent long distant travel.   Past Medical History:  Diagnosis Date  . Asthma   . Cancer ([place])    breast  . Chronic congestive heart failure with left ventricular diastolic dysfunction ([place])   . Diabetes mellitus   . Hyperlipidemia   . Hypertension   . Stroke [facility])     Past Surgical History:  Procedure Laterality Date  . BREAST  LUMPECTOMY    . EP IMPLANTABLE DEVICE N/A 08/07/2015   Procedure: Loop Recorder Insertion;  Surgeon: Thompson Grayer, MD;  Location: [place] [place] [facility];  Service: Cardiovascular;  Laterality: N/A;  . TEE WITHOUT CARDIOVERSION N/A 08/07/2015   Procedure: TRANSESOPHAGEAL ECHOCARDIOGRAM (TEE);  Surgeon: Sueanne Margarita, MD;  Location: [facility] ENDOSCOPY;  Service: Cardiovascular;  Laterality: N/A;  . TONSILLECTOMY       reports that she has been smoking.  She has been smoking about 0.50 packs per day. She has never used smokeless tobacco. She reports that she drinks about 3.6 oz of alcohol per week . She reports that she uses drugs, including "Crack" cocaine and Cocaine.  Allergies  Allergen Reactions  . Prednisone Hives and Other (See Comments)    Made her "crazy", suicidal  . Benicar [Olmesartan] Other (See Comments)    Did not work at all for patient  . Celexa [Citalopram] Other (See Comments)    Made patient feel crazy, fluoxetine is fine  . Effexor [Venlafaxine] Other (See Comments)    Made patient feel crazy  . Shrimp [Shellfish Allergy] Hives and Swelling    Family History  Problem Relation Age of Onset  . Diabetes Mother   . Hyperlipidemia Mother   . Hypertension Mother   . Breast cancer Mother   . Rheum arthritis Father   . Asthma Father   . Stroke Father   . Thyroid disease Father   . Thyroid disease Brother   . Colon cancer Brother        in his 39s  . Breast cancer Sister     Prior  to Admission medications   Medication Sig Start Date End Date Taking? Authorizing Provider  albuterol (PROVENTIL HFA;VENTOLIN HFA) 108 (90 Base) MCG/ACT inhaler Inhale 2 puffs into the lungs every 6 (six) hours as needed for wheezing or shortness of breath. Shortness of breath 01/02/17   Arnoldo Morale, MD  aspirin EC 81 MG tablet Take 162 mg by mouth daily.     [provider]  beclomethasone (QVAR) 80 MCG/ACT inhaler Inhale 1 puff into the lungs 2 (two) times daily. Rinse mouth with water  after use to reduce aftertaste and incidence of candidiasis. Do not swallow. 08/04/16   [provider]  clopidogrel (PLAVIX) 75 MG tablet Take 1 tablet (75 mg total) by mouth daily. 01/04/17   Arnoldo Morale, MD  cyclobenzaprine (FLEXERIL) 10 MG tablet Take 1 tablet (10 mg total) by mouth 2 (two) times daily as needed for muscle spasms. 04/13/17   Charlesetta Shanks, MD  diltiazem (CARDIZEM CD) 360 MG 24 hr capsule Take 1 capsule (360 mg total) by mouth daily. 12/12/16   Allred, Jeneen Rinks, MD  DULoxetine (CYMBALTA) 60 MG capsule Take 1 capsule (60 mg total) by mouth daily. 01/02/17   Arnoldo Morale, MD  escitalopram (LEXAPRO) 20 MG tablet Take 1 tablet (20 mg total) by mouth daily. 02/08/17   Arnoldo Morale, MD  folic acid (FOLVITE) 1 MG tablet Take 1 tablet (1 mg total) by mouth daily. Patient not taking: Reported on 04/13/2017 11/02/15   Geradine Girt, DO  furosemide (LASIX) 40 MG tablet Take 1.5 tablets (60 mg total) by mouth daily. 02/15/17 05/16/17  Baldwin Jamaica, PA-C  glipiZIDE (GLUCOTROL) 5 MG tablet Take 1 tablet (5 mg total) by mouth 2 (two) times daily before a meal. 01/02/17   Arnoldo Morale, MD  hydrALAZINE (APRESOLINE) 50 MG tablet Take 1 tablet (50 mg total) by mouth 3 (three) times daily. 12/12/16   Allred, Jeneen Rinks, MD  insulin aspart (NOVOLOG) 100 UNIT/ML injection Sliding scale CBG 70 - 120: 0 units CBG 121 - 150: 1 unit,  CBG 151 - 200: 2 units,  CBG 201 - 250: 3 units,  CBG 251 - 300: 5 units,  CBG 301 - 350: 7 units,  CBG 351 - 400: 9 units   CBG > 400: 9 units and notify your MD 01/02/17   Arnoldo Morale, MD  insulin glargine (LANTUS) 100 UNIT/ML injection Inject 0.15 mLs (15 Units total) into the skin at bedtime. 01/02/17   Arnoldo Morale, MD  metFORMIN (GLUCOPHAGE) 1000 MG tablet Take 1 tablet (1,000 mg total) by mouth 2 (two) times daily with a meal. 01/02/17   Arnoldo Morale, MD  naproxen sodium (ANAPROX) 220 MG tablet Take 440 mg by mouth 2 (two) times daily with a meal.    [provider]  oxyCODONE-acetaminophen (PERCOCET) 5-325 MG tablet Take 1-2 tablets by mouth every 4 (four) hours as needed. 04/13/17   Charlesetta Shanks, MD  potassium chloride SA (K-DUR,KLOR-CON) 20 MEQ tablet Take 1 tablet (20 mEq total) by mouth daily. 12/12/16   Allred, Jeneen Rinks, MD  predniSONE (DELTASONE) 20 MG tablet 3 tabs po daily x 3 days, then 2 tabs x 3 days, then 1.5 tabs x 3 days, then 1 tab x 3 days, then 0.5 tabs x 3 days 04/13/17   Charlesetta Shanks, MD  rosuvastatin (CRESTOR) 20 MG tablet Take 2 tablets (40 mg total) by mouth daily. 12/12/16   Allred, Jeneen Rinks, MD  traZODone (DESYREL) 150 MG tablet Take 1 tablet (150 mg total) by  mouth at bedtime. 01/02/17   Arnoldo Morale, MD    Physical Exam: Vitals:   05/19/17 1505 05/19/17 1506 05/19/17 1547 05/19/17 1603  BP: (!) 89/67 96/60 (!) 95/59 (!) 98/59  Pulse: 90 90 83 77  Resp: 17 16 19 14   Temp:      TempSrc:      SpO2: 98% 98% 98% 97%  Weight:      Height:         Constitutional: NAD, calm, comfortable Eyes: PERRL, lids and conjunctivae normal ENMT: Mucous membranes are moist. Posterior pharynx clear of any exudate or lesions. Neck: normal, supple, no masses, no thyromegaly Respiratory: clear to auscultation bilaterally, no wheezing, no crackles. Normal respiratory effort. No accessory muscle use.  Cardiovascular: Regular rate and rhythm, no murmurs / rubs / gallops. No extremity edema. 2+ pedal pulses. Abdomen: Obese no tenderness, no masses palpated. Bowel sounds positive.  Musculoskeletal: no clubbing / cyanosis. No joint deformity upper and lower extremities. Good ROM Skin: no rashes, lesions, ulcers. No induration Neurologic: CN 2-12 grossly intact. Sensation intact, DTR normal. Strength 5/5 in all 4.  Psychiatric: Alert and oriented x 3.    Labs on Admission: I have personally reviewed following labs and imaging studies  CBC:  Recent Labs Lab 05/19/17 0938  WBC 9.8  NEUTROABS 7.2  HGB 11.7*  HCT 36.3  MCV 87.7  PLT 786    Basic Metabolic Panel:  Recent Labs Lab 05/19/17 0938 05/19/17 1623  NA 139 146*  K 3.4* 3.5  CL 103 117*  CO2 27 26  GLUCOSE 292* 185*  BUN 13 10  CREATININE 1.04* 0.95  CALCIUM 8.7* 7.5*  MG 1.9  --    GFR: Estimated Creatinine Clearance: 62.7 mL/min (by C-G formula based on SCr of 0.95 mg/dL). Liver Function Tests:  Recent Labs Lab 05/19/17 0938  AST 17  ALT 16  ALKPHOS 89  BILITOT 0.5  PROT 7.0  ALBUMIN 3.6   No results for input(s): LIPASE, AMYLASE in the last 168 hours. No results for input(s): AMMONIA in the last 168 hours. Coagulation Profile: No results for input(s): INR, PROTIME in the last 168 hours. Cardiac Enzymes:  Recent Labs Lab 05/19/17 1623  CKTOTAL 74   BNP (last 3 results)  Recent Labs  12/12/16 1314  PROBNP 31   HbA1C: No results for input(s): HGBA1C in the last 72 hours. CBG: No results for input(s): GLUCAP in the last 168 hours. Lipid Profile: No results for input(s): CHOL, HDL, LDLCALC, TRIG, CHOLHDL, LDLDIRECT in the last 72 hours. Thyroid Function Tests: No results for input(s): TSH, T4TOTAL, FREET4, T3FREE, THYROIDAB in the last 72 hours. Anemia Panel: No results for input(s): VITAMINB12, FOLATE, FERRITIN, TIBC, IRON, RETICCTPCT in the last 72 hours. Urine analysis:    Component Value Date/Time   COLORURINE STRAW (A) 04/13/2017 1226   APPEARANCEUR CLEAR 04/13/2017 1226   LABSPEC 1.003 (L) 04/13/2017 1226   PHURINE 7.0 04/13/2017 1226   GLUCOSEU >=500 (A) 04/13/2017 1226   HGBUR NEGATIVE 04/13/2017 1226   BILIRUBINUR NEGATIVE 04/13/2017 1226   KETONESUR NEGATIVE 04/13/2017 1226   PROTEINUR NEGATIVE 04/13/2017 1226   UROBILINOGEN 0.2 08/05/2015 1304   NITRITE NEGATIVE 04/13/2017 1226   LEUKOCYTESUR NEGATIVE 04/13/2017 1226   Sepsis Labs: !!!!!!!!!!!!!!!!!!!!!!!!!!!!!!!!!!!!!!!!!!!! @LABRCNTIP (procalcitonin:4,lacticidven:4) )No results found for this or any previous visit (from the past 240 hour(s)).    Radiological Exams on Admission: No results found.  EKG: Independently reviewed. Sinus rhythm, QTC mild prolongated 517  Assessment/Plan 61 year old female with history  of severe depression admitted for suicidal attempt and intentional overdose patient is hemodynamically stable and protecting her airway  Intentional drug overdose with trazodone/suicidal attempt Admit to stepdown unit - patient hemodynamically stable. Patient also cocaine user, need to monitor BP closely as can rapidly increase. Avoid BB  EKG every 8 hours Agree with IV fluid hydration. Psych consult O2 supplement PRN   Hypotension likely due to trazodone overdose  Patient on IV fluid  Will hold BP meds  Continue to monitor   DM type 2  CBGS stable  Resume Lantus 15 un qHS SSI   HTN  BP now soft  Hold BP medications  Will add hydralazine in view of cocaine abuse, trazodone can also elevate BP   Hx of stroke  Continue plavix and ASA   DVT prophylaxis:  Lovenox Code Status: Full code Family Communication:  Disposition Plan: Anticipate discharge to inpatient psych  Consults called: Psych  Admission status: Inpatient/SDU    Chipper Oman MD [facility] Pager: Text Page via [email or web address]  [phone]  If 7PM-7AM, please contact night-coverage [email or web address] Password [facility]  05/19/2017, 5:45 PM

## 2017-05-19 NOTE — ED Notes (Signed)
Bed: RESB Expected date:  Expected time:  Means of arrival:  Comments: 61 yo f overdose

## 2017-05-19 NOTE — Progress Notes (Signed)
Patient refuses blood product

## 2017-05-19 NOTE — BH Assessment (Addendum)
Assessment Note  Lisa Anderson is an 61 y.o. female that presents this date after a attempted overdose on Trazodone. Patient presents with a depressed affect and speaks in a low voice. Patient is tearful at times and is drowsy finding it difficult to interact with this Probation officer. This writer has to wake patient several times during the assessment to ask questions. Patient reports she is currently residing with her adult daughter but due to excessive drug use was asked to leave that residence yesterday. Patient states that "was the final straw" and reported she took 10 tablets of Trazodone in a attempt to end her life.Pt states, "I might as well be dead," due to recurrent addiction problems. Patient states she has a history of 2 suicide attempts by overdose, the most recent of which was in 2014 and a suicidal gesture in which she tried to cut her wrist 5 years ago. These were all precipitated by similar stressors to today's. She denies any history of self mutilation. Patient denies homicidal thoughts or physical aggression.  Patient denies any past history of assaulting behaviors or AVH. Patient is oriented to place only and cannot recall the date/day due to being partially impaired. Patient denies having access to firearms or legal problems at this time. Patient does not appear to be responding to internal stimuli and exhibits no delusional thought. Patient reports ongoing crack cocaine for the last ten years with sporadic alcohol use.  Patient states her longest period of sobriety was 3 months, occurring about 2 years ago. Patient states she has been binging about every other week when she receives her paychecks using up to 500 dollars worth over a two day period. Within the past 24 hours she has used over 300 dollars of cocaine (about 5 grams) along with 6 to 10 12 oz beers. Patient reports current withdrawals to include tremors, agitation and racing thoughts. Patient reports she currently receives services from  [facility] and was last seen by that provider three months ago. Patient states she is currently compliant with her medication regimen for depression and has been prescribed Lexapro and Trazodone. Patient reports multiple attempts at self harm stating she attempted to overdose in 2014 and was admitted at [facility]. Patient states  Pt reports a history of several admissions to [facility] for mood and substance abuse problems. She was also in a 3 month residential rehab program at [place], at the facility on [address]. Additionally, she has been a resident at [facility] facilities in the past. Per EMS- patient states she took Trazodone 1000 mg at 0820 and had previously smoked crack at 0300. Patient told EMS that she was trying to kill herself, Patient had a previous suicide attempt 4 years ago. Case was staffed with Reita Cliche DNP who recommended patient be admitted inpatient as appropriate placement is investigated.  Diagnosis: MDD recurrent without psychotic features, Cocaine use  Past Medical History:  Past Medical History:  Diagnosis Date  . Asthma   . Cancer ([place])    breast  . Chronic congestive heart failure with left ventricular diastolic dysfunction ([place])   . Diabetes mellitus   . Hyperlipidemia   . Hypertension   . Stroke [facility])     Past Surgical History:  Procedure Laterality Date  . BREAST LUMPECTOMY    . EP IMPLANTABLE DEVICE N/A 08/07/2015   Procedure: Loop Recorder Insertion;  Surgeon: Thompson Grayer, MD;  Location: [facility];  Service: Cardiovascular;  Laterality: N/A;  . TEE WITHOUT CARDIOVERSION N/A  08/07/2015   Procedure: TRANSESOPHAGEAL ECHOCARDIOGRAM (TEE);  Surgeon: Sueanne Margarita, MD;  Location: [facility] ENDOSCOPY;  Service: Cardiovascular;  Laterality: N/A;  . TONSILLECTOMY      Family History:  Family History  Problem Relation Age of Onset  . Diabetes Mother   . Hyperlipidemia Mother   . Hypertension Mother   . Breast cancer Mother   . Rheum  arthritis Father   . Asthma Father   . Stroke Father   . Thyroid disease Father   . Thyroid disease Brother   . Colon cancer Brother        in his 91s  . Breast cancer Sister     Social History:  reports that she has been smoking.  She has been smoking about 0.50 packs per day. She has never used smokeless tobacco. She reports that she drinks about 3.6 oz of alcohol per week . She reports that she uses drugs, including "Crack" cocaine and Cocaine.  Additional Social History:  Alcohol / Drug Use Pain Medications: Denies Prescriptions: Denies Over the Counter: Denies History of alcohol / drug use?: Yes Longest period of sobriety (when/how long): 3 months, about 1 year ago Negative Consequences of Use: Work / Youth worker, Museum/gallery curator, Personal relationships Withdrawal Symptoms: Agitation, Tremors Substance #1 Name of Substance 1: Cocaine (Crack) 1 - Age of First Use: 32 1 - Amount (size/oz): 1 gram 1 - Frequency: Two to three days a week 1 - Duration: Last 5 years 1 - Last Use / Amount: 05/19/17 500 dollars Substance #2 Name of Substance 2: Alcohol 2 - Age of First Use: 61 y/o 2 - Amount (size/oz): Variable 2 - Frequency: Along with crack cocaine binges 2 - Duration: 1 month in current relapse 2 - Last Use / Amount: 05/19/17 2 12  oz beers  CIWA: CIWA-Ar BP: (!) 98/59 Pulse Rate: 77 COWS:    Allergies:  Allergies  Allergen Reactions  . Prednisone Hives and Other (See Comments)    Made her "crazy", suicidal  . Benicar [Olmesartan] Other (See Comments)    Did not work at all for patient  . Celexa [Citalopram] Other (See Comments)    Made patient feel crazy, fluoxetine is fine  . Effexor [Venlafaxine] Other (See Comments)    Made patient feel crazy  . Shrimp [Shellfish Allergy] Hives and Swelling    Home Medications:  (Not in a hospital admission)  OB/GYN Status:  No LMP recorded. Patient is postmenopausal.  General Assessment Data Assessment unable to be completed:  Yes Reason for not completing assessment: pt is sedated [facility] Assessment: [facility] TTS Assessment: In system Is this a Tele or Face-to-Face Assessment?: Face-to-Face Is this an Initial Assessment or a Re-assessment for this encounter?: Initial Assessment Marital status: Single Maiden name: NA Is patient pregnant?: No Pregnancy Status: No Living Arrangements: Children Can pt return to current living arrangement?: No Admission Status: Voluntary Is patient capable of signing voluntary admission?: Yes Referral Source: Self/Family/Friend Insurance type: Self Pay  Medical Screening Exam ([place]) Medical Exam completed: Yes  Crisis Care Plan Living Arrangements: Children Legal Guardian:  (NA) Name of Psychiatrist: [place] Name of Therapist: None  Education Status Is patient currently in school?: No Current Grade:  (NA) Highest grade of school patient has completed:  (12) Name of school:  (NA) Contact person:  (NA)  Risk to self with the past 6 months Suicidal Ideation: Yes-Currently Present Has patient been a risk to self within the past 6 months prior to admission? :  Yes Suicidal Intent: Yes-Currently Present Has patient had any suicidal intent within the past 6 months prior to admission? : Yes Is patient at risk for suicide?: Yes Suicidal Plan?: Yes-Currently Present Has patient had any suicidal plan within the past 6 months prior to admission? : Yes Specify Current Suicidal Plan: Overdose Access to Means: Yes Specify Access to Suicidal Means: Pt has medications What has been your use of drugs/alcohol within the last 12 months?: Current use Previous Attempts/Gestures: Yes How many times?: 3 Other Self Harm Risks: NA Triggers for Past Attempts: Unknown Intentional Self Injurious Behavior: None Family Suicide History: No Recent stressful life event(s): Other (Comment) (Increased depression) Persecutory voices/beliefs?: No Depression: Yes Depression  Symptoms: Feeling worthless/self pity Substance abuse history and/or treatment for substance abuse?: Yes Suicide prevention information given to non-admitted patients: Not applicable  Risk to Others within the past 6 months Homicidal Ideation: No Does patient have any lifetime risk of violence toward others beyond the six months prior to admission? : No Thoughts of Harm to Others: No Current Homicidal Intent: No Current Homicidal Plan: No Access to Homicidal Means: No Identified Victim: NA History of harm to others?: No Assessment of Violence: None Noted Violent Behavior Description: NA Does patient have access to weapons?: No Criminal Charges Pending?: No Does patient have a court date: No Is patient on probation?: No  Psychosis Hallucinations: None noted Delusions: None noted  Mental Status Report Appearance/Hygiene: In hospital gown Eye Contact: Fair Motor Activity: Freedom of movement Speech: Soft, Slow Level of Consciousness: Quiet/awake Mood: Depressed Affect: Appropriate to circumstance Anxiety Level: Minimal Thought Processes: Coherent, Relevant Judgement: Impaired Orientation: Place Obsessive Compulsive Thoughts/Behaviors: None  Cognitive Functioning Concentration: Decreased Memory: Recent Intact IQ: Average Insight: Fair Impulse Control: Poor Appetite: Fair Weight Loss: 0 Weight Gain: 0 Sleep: Decreased Total Hours of Sleep: 2 Vegetative Symptoms: None  ADLScreening [facility] Assessment Services) Patient's cognitive ability adequate to safely complete daily activities?: Yes Patient able to express need for assistance with ADLs?: Yes Independently performs ADLs?: Yes (appropriate for developmental age)  Prior Inpatient Therapy Prior Inpatient Therapy: Yes Prior Therapy Dates: 2014 Prior Therapy Facilty/Provider(s): [facility] Reason for Treatment: S/I, SA issues  Prior Outpatient Therapy Prior Outpatient Therapy: Yes Prior Therapy Dates: 2018 Prior  Therapy Facilty/Provider(s): [facility] Reason for Treatment: Med mang Does patient have an ACCT team?: No Does patient have Intensive In-House Services?  : No Does patient have [facility] services? : Yes Does patient have P4CC services?: No  ADL Screening (condition at time of admission) Patient's cognitive ability adequate to safely complete daily activities?: Yes Is the patient deaf or have difficulty hearing?: No Does the patient have difficulty seeing, even when wearing glasses/contacts?: No Does the patient have difficulty concentrating, remembering, or making decisions?: No Patient able to express need for assistance with ADLs?: Yes Does the patient have difficulty dressing or bathing?: No Independently performs ADLs?: Yes (appropriate for developmental age) Does the patient have difficulty walking or climbing stairs?: No Weakness of Legs: None Weakness of Arms/Hands: None  Home Assistive Devices/Equipment Home Assistive Devices/Equipment: None  Therapy Consults (therapy consults require a physician order) PT Evaluation Needed: No OT Evalulation Needed: No SLP Evaluation Needed: No Abuse/Neglect Assessment (Assessment to be complete while patient is alone) Physical Abuse: Denies Verbal Abuse: Denies Sexual Abuse: Denies Exploitation of patient/patient's resources: Denies Self-Neglect: Denies Values / Beliefs Cultural Requests During Hospitalization: None Spiritual Requests During Hospitalization: None Consults Spiritual Care Consult Needed: No Social Work Consult Needed: No Regulatory affairs officer (For  Healthcare) Does Patient Have a Medical Advance Directive?: No Would patient like information on creating a medical advance directive?: No - Patient declined Nutrition Screen- [facility] Adult/[facility] Patient's home diet: Carb modified Has the patient recently lost weight without trying?: No Has the patient been eating poorly because of a decreased appetite?: Yes Malnutrition Screening  Tool Score: 1  Additional Information 1:1 In Past 12 Months?: No CIRT Risk: No Elopement Risk: No Does patient have medical clearance?: No     Disposition: Case was staffed with Reita Cliche DNP who recommended patient be admitted inpatient as appropriate placement is investigated.  Disposition Initial Assessment Completed for this Encounter: Yes Disposition of Patient: Inpatient treatment program Type of inpatient treatment program: Adult  On Site Evaluation by:   Reviewed with Physician:    Mamie Nick 05/19/2017 6:11 PM

## 2017-05-19 NOTE — ED Notes (Signed)
Patient has one bag of belongings in the cabinet for 13-15.

## 2017-05-19 NOTE — ED Triage Notes (Signed)
Per EMS- patient states she took Trazodone 1000 mg at 0820 and had previously smoked crack at 0300. Patient told EMS that she was trying to kill herself, Patient had a previous suicide attempt 4 years ago.

## 2017-05-19 NOTE — ED Provider Notes (Signed)
[place] DEPT Provider Note   CSN: [id number] Arrival date & time: 05/19/17  0907     History   Chief Complaint Chief Complaint  Patient presents with  . Drug Overdose  . Suicidal    HPI Lisa Anderson is a 61 y.o. female.  HPI   61yo female with history of breast cancer, asthma, CHF, DM, htn, hlpd, CVA, depression, cocaine abuse, presents with concern for suicide attempt by overdose.  Reports using crack at 3:30AM, and taking 1000mg  of trazodone 45 minutes prior to arrival.  Denies taking other medications, using etoh.  Reports feels very sleepy now.  No other concerns, no vomiting, no diarrhea.  Reports she did this to hurt herself.  Reports worsening depression leading to this. Tried to hurt herself once before.  Past Medical History:  Diagnosis Date  . Asthma   . Cancer ([place])    breast  . Chronic congestive heart failure with left ventricular diastolic dysfunction ([place])   . Diabetes mellitus   . Hyperlipidemia   . Hypertension   . Stroke [facility])     Patient Active Problem List   Diagnosis Date Noted  . Drug overdose 05/19/2017  . Family history of colon cancer 02/23/2017  . Rectal bleeding 02/23/2017  . Gastroesophageal reflux disease without esophagitis 02/23/2017  . Intractable vomiting with nausea 02/23/2017  . Abnormal thyroid blood test 01/02/2017  . Insomnia 01/02/2017  . Anxiety 01/02/2017  . IDDM (insulin dependent diabetes mellitus) ([place])   . Asthma 11/12/2015  . Acute respiratory failure ([place]) 11/11/2015  . TIA (transient ischemic attack) 11/01/2015  . Chronic diastolic CHF (congestive heart failure) ([place]) 11/01/2015  . Acute focal neurological deficit 11/01/2015  . Type 2 diabetes mellitus with circulatory disorder ([place])   . HLD (hyperlipidemia)   . Palpitations   . Stroke ([place]) 08/05/2015  . Hypertension 08/05/2015  . Stroke due to embolism of right posterior cerebral artery ([place]) 08/05/2015  . Diabetes mellitus 08/05/2015  . Alcohol  dependency ([place]) 08/10/2013  . Cocaine abuse 08/08/2013  . Alcohol abuse 08/08/2013  . MDD (major depressive disorder) 08/08/2013    Past Surgical History:  Procedure Laterality Date  . BREAST LUMPECTOMY    . EP IMPLANTABLE DEVICE N/A 08/07/2015   Procedure: Loop Recorder Insertion;  Surgeon: Thompson Grayer, MD;  Location: [place] CV LAB;  Service: Cardiovascular;  Laterality: N/A;  . TEE WITHOUT CARDIOVERSION N/A 08/07/2015   Procedure: TRANSESOPHAGEAL ECHOCARDIOGRAM (TEE);  Surgeon: Sueanne Margarita, MD;  Location: [facility] ENDOSCOPY;  Service: Cardiovascular;  Laterality: N/A;  . TONSILLECTOMY      OB History    No data available       Home Medications    Prior to Admission medications   Medication Sig Start Date End Date Taking? Authorizing Provider  albuterol (PROVENTIL HFA;VENTOLIN HFA) 108 (90 Base) MCG/ACT inhaler Inhale 2 puffs into the lungs every 6 (six) hours as needed for wheezing or shortness of breath. Shortness of breath 01/02/17   Arnoldo Morale, MD  aspirin EC 81 MG tablet Take 162 mg by mouth daily.     [provider]  beclomethasone (QVAR) 80 MCG/ACT inhaler Inhale 1 puff into the lungs 2 (two) times daily. Rinse mouth with water after use to reduce aftertaste and incidence of candidiasis. Do not swallow. 08/04/16   [provider]  clopidogrel (PLAVIX) 75 MG tablet Take 1 tablet (75 mg total) by mouth daily. 01/04/17   Arnoldo Morale, MD  cyclobenzaprine (FLEXERIL) 10 MG tablet Take 1 tablet (  10 mg total) by mouth 2 (two) times daily as needed for muscle spasms. 04/13/17   Charlesetta Shanks, MD  diltiazem (CARDIZEM CD) 360 MG 24 hr capsule Take 1 capsule (360 mg total) by mouth daily. 12/12/16   Allred, Jeneen Rinks, MD  DULoxetine (CYMBALTA) 60 MG capsule Take 1 capsule (60 mg total) by mouth daily. 01/02/17   Arnoldo Morale, MD  escitalopram (LEXAPRO) 20 MG tablet Take 1 tablet (20 mg total) by mouth daily. 02/08/17   Arnoldo Morale, MD  folic acid (FOLVITE) 1 MG  tablet Take 1 tablet (1 mg total) by mouth daily. Patient not taking: Reported on 04/13/2017 11/02/15   Geradine Girt, DO  furosemide (LASIX) 40 MG tablet Take 1.5 tablets (60 mg total) by mouth daily. 02/15/17 05/16/17  Baldwin Jamaica, PA-C  glipiZIDE (GLUCOTROL) 5 MG tablet Take 1 tablet (5 mg total) by mouth 2 (two) times daily before a meal. 01/02/17   Arnoldo Morale, MD  hydrALAZINE (APRESOLINE) 50 MG tablet Take 1 tablet (50 mg total) by mouth 3 (three) times daily. 12/12/16   Allred, Jeneen Rinks, MD  insulin aspart (NOVOLOG) 100 UNIT/ML injection Sliding scale CBG 70 - 120: 0 units CBG 121 - 150: 1 unit,  CBG 151 - 200: 2 units,  CBG 201 - 250: 3 units,  CBG 251 - 300: 5 units,  CBG 301 - 350: 7 units,  CBG 351 - 400: 9 units   CBG > 400: 9 units and notify your MD 01/02/17   Arnoldo Morale, MD  insulin glargine (LANTUS) 100 UNIT/ML injection Inject 0.15 mLs (15 Units total) into the skin at bedtime. 01/02/17   Arnoldo Morale, MD  metFORMIN (GLUCOPHAGE) 1000 MG tablet Take 1 tablet (1,000 mg total) by mouth 2 (two) times daily with a meal. 01/02/17   Arnoldo Morale, MD  naproxen sodium (ANAPROX) 220 MG tablet Take 440 mg by mouth 2 (two) times daily with a meal.    [provider]  oxyCODONE-acetaminophen (PERCOCET) 5-325 MG tablet Take 1-2 tablets by mouth every 4 (four) hours as needed. 04/13/17   Charlesetta Shanks, MD  potassium chloride SA (K-DUR,KLOR-CON) 20 MEQ tablet Take 1 tablet (20 mEq total) by mouth daily. 12/12/16   Allred, Jeneen Rinks, MD  predniSONE (DELTASONE) 20 MG tablet 3 tabs po daily x 3 days, then 2 tabs x 3 days, then 1.5 tabs x 3 days, then 1 tab x 3 days, then 0.5 tabs x 3 days 04/13/17   Charlesetta Shanks, MD  rosuvastatin (CRESTOR) 20 MG tablet Take 2 tablets (40 mg total) by mouth daily. 12/12/16   Allred, Jeneen Rinks, MD  traZODone (DESYREL) 150 MG tablet Take 1 tablet (150 mg total) by mouth at bedtime. 01/02/17   Arnoldo Morale, MD    Family History Family History  Problem Relation Age of  Onset  . Diabetes Mother   . Hyperlipidemia Mother   . Hypertension Mother   . Breast cancer Mother   . Rheum arthritis Father   . Asthma Father   . Stroke Father   . Thyroid disease Father   . Thyroid disease Brother   . Colon cancer Brother        in his 75s  . Breast cancer Sister     Social History Social History  Substance Use Topics  . Smoking status: Current Every Day Smoker    Packs/day: 0.50  . Smokeless tobacco: Never Used  . Alcohol use 3.6 oz/week    6 Cans of beer per week  Comment: occasionally     Allergies   Prednisone; Benicar [olmesartan]; Celexa [citalopram]; Effexor [venlafaxine]; and Shrimp [shellfish allergy]   Review of Systems Review of Systems  Unable to perform ROS: Mental status change  Constitutional: Negative for fever.  Eyes: Negative for visual disturbance.  Respiratory: Negative for cough and shortness of breath.   Cardiovascular: Negative for chest pain.  Gastrointestinal: Negative for abdominal pain, nausea and vomiting.  Genitourinary: Negative for difficulty urinating.  Skin: Negative for rash.  Neurological: Negative for syncope and headaches.  Psychiatric/Behavioral: Positive for self-injury and suicidal ideas.     Physical Exam Updated Vital Signs BP (!) 91/55   Pulse 81   Temp 98.9 F (37.2 C) (Oral)   Resp 14   Ht 5\' 5"  (1.651 m)   Wt 70.3 kg (155 lb)   SpO2 92%   BMI 25.79 kg/m   Physical Exam  Constitutional: She is oriented to person, place, and time. She appears well-developed and well-nourished. No distress.  HENT:  Head: Normocephalic and atraumatic.  Eyes: Conjunctivae and EOM are normal.  Neck: Normal range of motion.  Cardiovascular: Normal rate, regular rhythm, normal heart sounds and intact distal pulses.  Exam reveals no gallop and no friction rub.   No murmur heard. Pulmonary/Chest: Effort normal and breath sounds normal. No respiratory distress. She has no wheezes. She has no rales.    Abdominal: Soft. She exhibits no distension. There is no tenderness. There is no guarding.  Musculoskeletal: She exhibits no edema or tenderness.  Neurological: She is alert and oriented to person, place, and time.  Skin: Skin is warm and dry. No rash noted. She is not diaphoretic. No erythema.  Nursing note and vitals reviewed.    ED Treatments / Results  Labs (all labs ordered are listed, but only abnormal results are displayed) Labs Reviewed  COMPREHENSIVE METABOLIC PANEL - Abnormal; Notable for the following:       Result Value   Potassium 3.4 (*)    Glucose, Bld 292 (*)    Creatinine, Ser 1.04 (*)    Calcium 8.7 (*)    GFR calc non Af Amer 58 (*)    All other components within normal limits  RAPID URINE DRUG SCREEN, HOSP PERFORMED - Abnormal; Notable for the following:    Cocaine POSITIVE (*)    All other components within normal limits  CBC WITH DIFFERENTIAL/PLATELET - Abnormal; Notable for the following:    Hemoglobin 11.7 (*)    All other components within normal limits  ACETAMINOPHEN LEVEL - Abnormal; Notable for the following:    Acetaminophen (Tylenol), Serum <10 (*)    All other components within normal limits  ACETAMINOPHEN LEVEL - Abnormal; Notable for the following:    Acetaminophen (Tylenol), Serum <10 (*)    All other components within normal limits  BASIC METABOLIC PANEL - Abnormal; Notable for the following:    Sodium 146 (*)    Chloride 117 (*)    Glucose, Bld 185 (*)    Calcium 7.5 (*)    Anion gap 3 (*)    All other components within normal limits  GLUCOSE, CAPILLARY - Abnormal; Notable for the following:    Glucose-Capillary 155 (*)    All other components within normal limits  MRSA PCR SCREENING  ETHANOL  SALICYLATE LEVEL  MAGNESIUM  CK  BASIC METABOLIC PANEL    EKG  EKG Interpretation  Date/Time:  Friday May 19 2017 09:17:45 EDT Ventricular Rate:  93 PR Interval:  QRS Duration: 85 QT Interval:  417 QTC Calculation: 519 R  Axis:   -42 Text Interpretation:  Sinus rhythm Left axis deviation RSR' in V1 or V2, right VCD or RVH Prolonged QT interval No significant change since last tracing Confirmed by Gareth Morgan [phone]) on 05/19/2017 10:24:02 AM       Radiology No results found.  Procedures Procedures (including critical care time)  Medications Ordered in ED Medications  clopidogrel (PLAVIX) tablet 75 mg (not administered)  insulin glargine (LANTUS) injection 15 Units (15 Units Subcutaneous Given 05/19/17 2120)  aspirin EC tablet 162 mg (not administered)  enoxaparin (LOVENOX) injection 40 mg (40 mg Subcutaneous Given 05/19/17 2100)  0.45 % sodium chloride infusion ( Intravenous New Bag/Given 05/19/17 1822)  ketorolac (TORADOL) 15 MG/ML injection 15 mg (not administered)  senna-docusate (Senokot-S) tablet 1 tablet (not administered)  ondansetron (ZOFRAN) tablet 4 mg (not administered)    Or  ondansetron (ZOFRAN) injection 4 mg (not administered)  insulin aspart (novoLOG) injection 0-15 Units (not administered)  hydrALAZINE (APRESOLINE) injection 5 mg (not administered)  magnesium sulfate IVPB 2 g 50 mL (0 g Intravenous Stopped 05/19/17 1052)  sodium chloride 0.9 % bolus 1,000 mL (0 mLs Intravenous Stopped 05/19/17 1027)  sodium chloride 0.9 % bolus 1,000 mL (0 mLs Intravenous Stopped 05/19/17 1113)    Followed by  sodium chloride 0.9 % bolus 1,000 mL (0 mLs Intravenous Stopped 05/19/17 1157)    Followed by  sodium chloride 0.9 % bolus 1,000 mL (0 mLs Intravenous Stopped 05/19/17 1227)  potassium chloride 10 mEq in 100 mL IVPB (0 mEq Intravenous Stopped 05/19/17 1245)  sodium chloride 0.9 % bolus 1,000 mL (1,000 mLs Intravenous New Bag/Given 05/19/17 1623)   CRITICAL CARE: hypotension, overdose, BP less than 80, 3L NS given Performed by: Alvino Chapel   Total critical care time: 30 minutes  Critical care time was exclusive of separately billable procedures and treating other  patients.  Critical care was necessary to treat or prevent imminent or life-threatening deterioration.  Critical care was time spent personally by me on the following activities: development of treatment plan with patient and/or surrogate as well as nursing, discussions with consultants, evaluation of patient's response to treatment, examination of patient, obtaining history from patient or surrogate, ordering and performing treatments and interventions, ordering and review of laboratory studies, ordering and review of radiographic studies, pulse oximetry and re-evaluation of patient's condition.   Initial Impression / Assessment and Plan / ED Course  I have reviewed the triage vital signs and the nursing notes.  Pertinent labs & imaging results that were available during my care of the patient were reviewed by me and considered in my medical decision making (see chart for details).      61yo female with history of breast cancer, asthma, CHF, DM, htn, hlpd, CVA, depression, cocaine abuse, presents with concern for suicide attempt by overdose.  Reports using crack and trazodone. QTc prolonged. Given magnesium, K replacement.  Blood pressures decreased down to 70s, given IV fluids with improvement, followed by fluctuation in blood pressures, however stabilizing around 35K systolic.  Patient somnolent however protecting her airway, awakens to voice.  Labs without significant abnormalities. Discussed with poison control.  4hr acetaminophen negative.  Patient observed 8hr after ingestion with continuing borderline blood pressures and somnolence. Discussed with poison control and suspect possible other coingestion, possible flexeril given this is on medication list.  Will admit for continued care given patient with continuing symptoms. Will consult TTS, consider IVC  if pt attempts to leave however she is voluntary at this time..  Final Clinical Impressions(s) / ED Diagnoses   Final diagnoses:   Intentional drug overdose, initial encounter [facility])  Suicidal ideation    New Prescriptions Current Discharge Medication List       Gareth Morgan, MD 05/19/17 2255

## 2017-05-19 NOTE — BH Assessment (Signed)
[facility] Assessment Progress Note     Case was staffed with Reita Cliche DNP who recommended patient be admitted inpatient as appropriate placement is investigated.

## 2017-05-20 DIAGNOSIS — F419 Anxiety disorder, unspecified: Secondary | ICD-10-CM

## 2017-05-20 DIAGNOSIS — R45851 Suicidal ideations: Secondary | ICD-10-CM

## 2017-05-20 LAB — BASIC METABOLIC PANEL
Anion gap: 3 — ABNORMAL LOW (ref 5–15)
BUN: 10 mg/dL (ref 6–20)
CALCIUM: 7.7 mg/dL — AB (ref 8.9–10.3)
CO2: 26 mmol/L (ref 22–32)
CREATININE: 1 mg/dL (ref 0.44–1.00)
Chloride: 117 mmol/L — ABNORMAL HIGH (ref 101–111)
GFR calc non Af Amer: >60 mL/min (ref >60)
Glucose, Bld: 101 mg/dL — ABNORMAL HIGH (ref 65–99)
Potassium: 3.4 mmol/L — ABNORMAL LOW (ref 3.5–5.1)
SODIUM: 146 mmol/L — AB (ref 135–145)

## 2017-05-20 LAB — GLUCOSE, CAPILLARY
GLUCOSE-CAPILLARY: 88 mg/dL (ref 65–99)
GLUCOSE-CAPILLARY: 189 mg/dL — AB (ref 65–99)
Glucose-Capillary: 127 mg/dL — ABNORMAL HIGH (ref 65–99)
Glucose-Capillary: 151 mg/dL — ABNORMAL HIGH (ref 65–99)

## 2017-05-20 MED ORDER — HYDRALAZINE HCL 50 MG PO TABS
50.0000 mg | ORAL_TABLET | Freq: Three times a day (TID) | ORAL | Status: DC
  Administered 2017-05-20 – 2017-05-22 (×6): 50 mg via ORAL
  Filled 2017-05-20 (×6): qty 1

## 2017-05-20 MED ORDER — ALBUTEROL SULFATE HFA 108 (90 BASE) MCG/ACT IN AERS: 2.0000 | INHALATION_SPRAY | Freq: Four times a day (QID) | RESPIRATORY_TRACT | Status: DC | PRN

## 2017-05-20 MED ORDER — DILTIAZEM HCL ER COATED BEADS 180 MG PO CP24
360.0000 mg | ORAL_CAPSULE | Freq: Every day | ORAL | Status: DC
  Administered 2017-05-20 – 2017-05-22 (×3): 360 mg via ORAL
  Filled 2017-05-20 (×3): qty 2

## 2017-05-20 MED ORDER — ALBUTEROL SULFATE (2.5 MG/3ML) 0.083% IN NEBU: 2.5000 mg | INHALATION_SOLUTION | Freq: Four times a day (QID) | RESPIRATORY_TRACT | Status: DC | PRN

## 2017-05-20 MED ORDER — POTASSIUM CHLORIDE CRYS ER 20 MEQ PO TBCR
40.0000 meq | EXTENDED_RELEASE_TABLET | ORAL | Status: AC
  Administered 2017-05-20 (×2): 40 meq via ORAL
  Filled 2017-05-20 (×2): qty 2

## 2017-05-20 MED ORDER — BUDESONIDE 0.5 MG/2ML IN SUSP
0.5000 mg | Freq: Two times a day (BID) | RESPIRATORY_TRACT | Status: DC
  Administered 2017-05-20 – 2017-05-22 (×2): 0.5 mg via RESPIRATORY_TRACT
  Filled 2017-05-20 (×4): qty 2

## 2017-05-20 MED ORDER — ESCITALOPRAM OXALATE 20 MG PO TABS
20.0000 mg | ORAL_TABLET | Freq: Every day | ORAL | Status: DC
  Administered 2017-05-20 – 2017-05-21 (×2): 20 mg via ORAL
  Filled 2017-05-20 (×2): qty 1

## 2017-05-20 MED ORDER — DULOXETINE HCL 30 MG PO CPEP
60.0000 mg | ORAL_CAPSULE | Freq: Every day | ORAL | Status: DC
  Administered 2017-05-20 – 2017-05-22 (×3): 60 mg via ORAL
  Filled 2017-05-20 (×3): qty 2

## 2017-05-20 MED ORDER — POTASSIUM CHLORIDE CRYS ER 20 MEQ PO TBCR: 40.0000 meq | EXTENDED_RELEASE_TABLET | Freq: Two times a day (BID) | ORAL | Status: DC

## 2017-05-20 NOTE — Progress Notes (Addendum)
PROGRESS NOTE [facility]   Mathea Frieling   BJS:[id number] DOB: 05-20-1956  DOA: 05/19/2017 PCP: Arnoldo Morale, MD   Brief Narrative:  Lisa Anderson a 61 year old female with medical history significant for hypertension, diabetes, stroke and severe depression presented to the emergency department after a suicide attempt. She took 10 pills of 50 mg of trazodone. She was admitted to monitor cardiac arrhythmias and psych evaluation.  Subjective: Patient seen and examined today, she reported feeling okay but still having suicidal thoughts. She stated that "she doesn't need to be here anymore". Denies chest pain, shortness of breath and palpitations. No hallucinations or homicidal ideas.  Assessment & Plan: Intentional drug overdose with trazodone/suicidal attempt Patient hemodynamically stable. Patient also cocaine user See below  Severe depression Awaiting psych consult, patient need inpatient psych This is her fourth suicide attempt she might benefit from ECT. Continue sitter as patient is at risk to harm herself Patient is medically clear  Prolong QTc   Due to trazodone overdose EKG monitoring, QTc trending down as 10/01/1999  Hypotension likely due to trazodone overdose - Resolved  D/c IVF   DM type 2  CBGS stable  Continue Lantus 15 un qHS SSI   HTN   BP not above goal, likely due to fluids and discontinuation of home medications Resume home BP medications Avoid beta blockers as patient is cocaine user  Hx of stroke  Continue plavix and ASA   DVT prophylaxis: Lovenox Code Status: Full code Family Communication: None at bedside Disposition Plan: Anticipate discharge to inpatient psych unit, patient is medically clear at this point    Consultants:   Psych  Procedures:   None   Antimicrobials:  None     Objective: Vitals:   05/20/17 0448 05/20/17 0500 05/20/17 0554 05/20/17 0600  BP: 117/69 (!) 126/58  116/65  Pulse: 83 77  73  Resp: 14  14  (!) 9  Temp:      TempSrc:      SpO2: 98% 99%  99%  Weight:   75.4 kg (166 lb 3.6 oz)   Height:        Intake/Output Summary (Last 24 hours) at 05/20/17 6073 Last data filed at 05/20/17 0400  Gross per 24 hour  Intake           4922.5 ml  Output                0 ml  Net           4922.5 ml   Filed Weights   05/19/17 0953 05/20/17 0554  Weight: 70.3 kg (155 lb) 75.4 kg (166 lb 3.6 oz)    Examination:  General: Pt is alert, awake, not in acute distress Cardiovascular: RRR, S1/S2 +, no rubs, no gallops Respiratory: CTA bilaterally, no wheezing, no rhonchi Abdominal: Soft, NT, ND, bowel sounds + Extremities: no edema, no cyanosis Psychiatry: Positive suicidal ideas, no HI or hallucinations. Flat affect, mood very depressed    Data Reviewed: I have personally reviewed following labs and imaging studies  CBC:  Recent Labs Lab 05/19/17 0938  WBC 9.8  NEUTROABS 7.2  HGB 11.7*  HCT 36.3  MCV 87.7  PLT 710   Basic Metabolic Panel:  Recent Labs Lab 05/19/17 0938 05/19/17 1623 05/20/17 0310  NA 139 146* 146*  K 3.4* 3.5 3.4*  CL 103 117* 117*  CO2 27 26 26   GLUCOSE 292* 185* 101*  BUN 13 10 10   CREATININE 1.04* 0.95 1.00  CALCIUM 8.7*  7.5* 7.7*  MG 1.9  --   --    GFR: Estimated Creatinine Clearance: 61.6 mL/min (by C-G formula based on SCr of 1 mg/dL). Liver Function Tests:  Recent Labs Lab 05/19/17 0938  AST 17  ALT 16  ALKPHOS 89  BILITOT 0.5  PROT 7.0  ALBUMIN 3.6   No results for input(s): LIPASE, AMYLASE in the last 168 hours. No results for input(s): AMMONIA in the last 168 hours. Coagulation Profile: No results for input(s): INR, PROTIME in the last 168 hours. Cardiac Enzymes:  Recent Labs Lab 05/19/17 1623  CKTOTAL 74   BNP (last 3 results)  Recent Labs  12/12/16 1314  PROBNP 31   HbA1C: No results for input(s): HGBA1C in the last 72 hours. CBG:  Recent Labs Lab 05/19/17 2116 05/19/17 2343  GLUCAP 155* 117*   Lipid  Profile: No results for input(s): CHOL, HDL, LDLCALC, TRIG, CHOLHDL, LDLDIRECT in the last 72 hours. Thyroid Function Tests: No results for input(s): TSH, T4TOTAL, FREET4, T3FREE, THYROIDAB in the last 72 hours. Anemia Panel: No results for input(s): VITAMINB12, FOLATE, FERRITIN, TIBC, IRON, RETICCTPCT in the last 72 hours. Sepsis Labs: No results for input(s): PROCALCITON, LATICACIDVEN in the last 168 hours.  Recent Results (from the past 240 hour(s))  MRSA PCR Screening     Status: None   Collection Time: 05/19/17  6:53 PM  Result Value Ref Range Status   MRSA by PCR NEGATIVE NEGATIVE Final    Comment:        The GeneXpert MRSA Assay (FDA approved for NASAL specimens only), is one component of a comprehensive MRSA colonization surveillance program. It is not intended to diagnose MRSA infection nor to guide or monitor treatment for MRSA infections.       Radiology Studies: No results found.    Scheduled Meds: . aspirin EC  162 mg Oral Daily  . clopidogrel  75 mg Oral Daily  . enoxaparin (LOVENOX) injection  40 mg Subcutaneous Q24H  . insulin aspart  0-15 Units Subcutaneous TID WC  . insulin glargine  15 Units Subcutaneous QHS  . potassium chloride  40 mEq Oral Q4H   Continuous Infusions: . sodium chloride 75 mL/hr at 05/19/17 1822     LOS: 1 day    Time spent: Total of 25 minutes spent with pt, greater than 50% of which was spent in discussion of  treatment, counseling and coordination of care    Chipper Oman, MD Pager: Text Page via [email or web address]   If 7PM-7AM, please contact night-coverage [email or web address] 05/20/2017, 8:33 AM

## 2017-05-21 DIAGNOSIS — F332 Major depressive disorder, recurrent severe without psychotic features: Secondary | ICD-10-CM

## 2017-05-21 DIAGNOSIS — T43212A Poisoning by selective serotonin and norepinephrine reuptake inhibitors, intentional self-harm, initial encounter: Principal | ICD-10-CM

## 2017-05-21 DIAGNOSIS — T1491XA Suicide attempt, initial encounter: Secondary | ICD-10-CM

## 2017-05-21 DIAGNOSIS — F1420 Cocaine dependence, uncomplicated: Secondary | ICD-10-CM

## 2017-05-21 DIAGNOSIS — F1410 Cocaine abuse, uncomplicated: Secondary | ICD-10-CM

## 2017-05-21 DIAGNOSIS — F1424 Cocaine dependence with cocaine-induced mood disorder: Secondary | ICD-10-CM

## 2017-05-21 LAB — BASIC METABOLIC PANEL
Anion gap: 4 — ABNORMAL LOW (ref 5–15)
BUN: 15 mg/dL (ref 6–20)
CALCIUM: 8.5 mg/dL — AB (ref 8.9–10.3)
CHLORIDE: 112 mmol/L — AB (ref 101–111)
CO2: 27 mmol/L (ref 22–32)
CREATININE: 1.14 mg/dL — AB (ref 0.44–1.00)
GFR calc non Af Amer: 52 mL/min — ABNORMAL LOW (ref >60)
GFR, EST AFRICAN AMERICAN: 60 mL/min — AB (ref >60)
Glucose, Bld: 90 mg/dL (ref 65–99)
Potassium: 3.6 mmol/L (ref 3.5–5.1)
Sodium: 143 mmol/L (ref 135–145)

## 2017-05-21 LAB — GLUCOSE, CAPILLARY
GLUCOSE-CAPILLARY: 190 mg/dL — AB (ref 65–99)
Glucose-Capillary: 69 mg/dL (ref 65–99)
Glucose-Capillary: 127 mg/dL — ABNORMAL HIGH (ref 65–99)
Glucose-Capillary: 175 mg/dL — ABNORMAL HIGH (ref 65–99)
Glucose-Capillary: 199 mg/dL — ABNORMAL HIGH (ref 65–99)

## 2017-05-21 MED ORDER — SODIUM CHLORIDE 0.9 % IV BOLUS (SEPSIS)
1000.0000 mL | Freq: Once | INTRAVENOUS | Status: AC
  Administered 2017-05-21: 1000 mL via INTRAVENOUS

## 2017-05-21 MED ORDER — GABAPENTIN 100 MG PO CAPS
200.0000 mg | ORAL_CAPSULE | Freq: Two times a day (BID) | ORAL | Status: DC
Start: 2017-05-21 — End: 2017-05-22
  Administered 2017-05-21 – 2017-05-22 (×3): 200 mg via ORAL
  Filled 2017-05-21 (×3): qty 2

## 2017-05-21 MED ORDER — LIP MEDEX EX OINT
TOPICAL_OINTMENT | CUTANEOUS | Status: AC
  Filled 2017-05-21: qty 7

## 2017-05-21 NOTE — Progress Notes (Signed)
Pt had been complaining of missing items including clothes and her ID, AC located items and brought them up on the floor. They have been stored. Pt will be notified of found items when she wakes up.

## 2017-05-21 NOTE — Progress Notes (Signed)
PROGRESS NOTE [facility]   Lisa Anderson   KWI:[id number] DOB: [birth year]  DOA: 05/19/2017 PCP: Arnoldo Morale, MD   Brief Narrative:  Lisa Anderson a 61 year old female with medical history significant for hypertension, diabetes, stroke and severe depression presented to the emergency department after a suicide attempt. She took 10 pills of 50 mg of trazodone. She was admitted to monitor cardiac arrhythmias and psych evaluation.  Subjective: Patient continues to have suicidal thoughts, report "she doesn't have anything to live for" no acute events overnight  Assessment & Plan: Intentional drug overdose with trazodone/suicidal attempt Patient hemodynamically stable. Patient also cocaine user See below  Severe depression Psych consult appreciated patient needs inpatient psych This is her fourth suicide attempt she might benefit from ECT. Continue sitter as patient is at risk to harm herself Patient started on Cymbalta and Lexapro, psychiatry recommended weaning off Lexapro and start gabapentin Patient is medically cleared for inpatient psych admission  Prolong QTc  - resolved Due to trazodone overdose EKG monitoring, QTc normalized  Hypotension likely due to trazodone overdose - Resolved  D/c IVF   DM type 2  CBGs remain stable Continue Lantus 15 un qHS SSI   HTN   BP now stable Resume home BP medications Avoid beta blockers as patient is cocaine user  Hx of stroke  Continue plavix and ASA   DVT prophylaxis: Lovenox Code Status: Full code Family Communication: None at bedside Disposition Plan: Discharge to inpatient psych unit when bed available.  Consultants:   Psych  Procedures:   None   Antimicrobials:  None     Objective: Vitals:   05/20/17 2000 05/20/17 2124 05/21/17 0638 05/21/17 1358  BP:  (!) 148/67 134/65 (!) 153/69  Pulse:  86 73 73  Resp:  18 18 18   Temp:  98 F (36.7 C) 98.8 F (37.1 C) 98.5 F (36.9 C)  TempSrc:  Oral  Oral Oral  SpO2: 96% 95% 99% 98%  Weight:      Height:        Intake/Output Summary (Last 24 hours) at 05/21/17 1637 Last data filed at 05/21/17 1300  Gross per 24 hour  Intake             1080 ml  Output                0 ml  Net             1080 ml   Filed Weights   05/19/17 0953 05/20/17 0554  Weight: 70.3 kg (155 lb) 75.4 kg (166 lb 3.6 oz)    Examination:  General: Pt is alert, awake, not in acute distress Cardiovascular: RRR, S1/S2 +, no rubs, no gallops Respiratory: CTA bilaterally, no wheezing, no rhonchi Psychiatry: Positive suicidal ideas, no HI or hallucinations. Flat affect, mood very depressed    Data Reviewed: I have personally reviewed following labs and imaging studies  CBC:  Recent Labs Lab 05/19/17 0938  WBC 9.8  NEUTROABS 7.2  HGB 11.7*  HCT 36.3  MCV 87.7  PLT 242   Basic Metabolic Panel:  Recent Labs Lab 05/19/17 0938 05/19/17 1623 05/20/17 0310 05/21/17 0625  NA 139 146* 146* 143  K 3.4* 3.5 3.4* 3.6  CL 103 117* 117* 112*  CO2 27 26 26 27   GLUCOSE 292* 185* 101* 90  BUN 13 10 10 15   CREATININE 1.04* 0.95 1.00 1.14*  CALCIUM 8.7* 7.5* 7.7* 8.5*  MG 1.9  --   --   --  GFR: Estimated Creatinine Clearance: 54 mL/min (A) (by C-G formula based on SCr of 1.14 mg/dL (H)). Liver Function Tests:  Recent Labs Lab 05/19/17 0938  AST 17  ALT 16  ALKPHOS 89  BILITOT 0.5  PROT 7.0  ALBUMIN 3.6   No results for input(s): LIPASE, AMYLASE in the last 168 hours. No results for input(s): AMMONIA in the last 168 hours. Coagulation Profile: No results for input(s): INR, PROTIME in the last 168 hours. Cardiac Enzymes:  Recent Labs Lab 05/19/17 1623  CKTOTAL 74   BNP (last 3 results)  Recent Labs  12/12/16 1314  PROBNP 31   HbA1C: No results for input(s): HGBA1C in the last 72 hours. CBG:  Recent Labs Lab 05/20/17 1715 05/20/17 2121 05/21/17 0737 05/21/17 0835 05/21/17 1217  GLUCAP 127* 151* 69 127* 190*   Lipid  Profile: No results for input(s): CHOL, HDL, LDLCALC, TRIG, CHOLHDL, LDLDIRECT in the last 72 hours. Thyroid Function Tests: No results for input(s): TSH, T4TOTAL, FREET4, T3FREE, THYROIDAB in the last 72 hours. Anemia Panel: No results for input(s): VITAMINB12, FOLATE, FERRITIN, TIBC, IRON, RETICCTPCT in the last 72 hours. Sepsis Labs: No results for input(s): PROCALCITON, LATICACIDVEN in the last 168 hours.  Recent Results (from the past 240 hour(s))  MRSA PCR Screening     Status: None   Collection Time: 05/19/17  6:53 PM  Result Value Ref Range Status   MRSA by PCR NEGATIVE NEGATIVE Final    Comment:        The GeneXpert MRSA Assay (FDA approved for NASAL specimens only), is one component of a comprehensive MRSA colonization surveillance program. It is not intended to diagnose MRSA infection nor to guide or monitor treatment for MRSA infections.       Radiology Studies: No results found.    Scheduled Meds: . aspirin EC  162 mg Oral Daily  . budesonide  0.5 mg Nebulization BID  . clopidogrel  75 mg Oral Daily  . diltiazem  360 mg Oral Daily  . DULoxetine  60 mg Oral Daily  . enoxaparin (LOVENOX) injection  40 mg Subcutaneous Q24H  . gabapentin  200 mg Oral BID  . hydrALAZINE  50 mg Oral TID  . insulin aspart  0-15 Units Subcutaneous TID WC  . insulin glargine  15 Units Subcutaneous QHS  . lip balm       Continuous Infusions: . sodium chloride       LOS: 2 days    Time spent: Total of 15 minutes spent with pt, greater than 50% of which was spent in discussion of  treatment, counseling and coordination of care    Chipper Oman, MD Pager: Text Page via [email or web address]   If 7PM-7AM, please contact night-coverage [email or web address] 05/21/2017, 4:37 PM

## 2017-05-21 NOTE — Progress Notes (Signed)
I made two attempts to visit pt today. The first visit she aroused a little and agreed that she would like a visit later. On my second attempt, pt was asleep. I will refer her to our day Chaplains. Please page if assistance is needed prior to Monday morning. [place], [place]   05/21/17 1900  Clinical Encounter Type  Visited With Other (Comment)

## 2017-05-21 NOTE — Consult Note (Signed)
[facility] Consult   Reason for Consult:  Intentional overdose Referring Physician:  Dr. Quincy Simmonds Patient Identification: Lisa Anderson MRN:  [id number] Principal Diagnosis: Major depressive disorder, recurrent episode, severe ([place]) Diagnosis:   Patient Active Problem List   Diagnosis Date Noted  . Major depressive disorder, recurrent episode, severe ([place]) [F33.2] 05/21/2017  . Cocaine use disorder, severe, dependence ([place]) [F14.20] 05/21/2017  . Suicidal intent [R45.851] 05/20/2017  . Drug overdose [T50.901A] 05/19/2017  . Family history of colon cancer [Z80.0] 02/23/2017  . Rectal bleeding [K62.5] 02/23/2017  . Gastroesophageal reflux disease without esophagitis [K21.9] 02/23/2017  . Intractable vomiting with nausea [R11.2] 02/23/2017  . Abnormal thyroid blood test [R94.6] 01/02/2017  . Insomnia [G47.00] 01/02/2017  . Anxiety [F41.9] 01/02/2017  . IDDM (insulin dependent diabetes mellitus) ([place]) [E11.9, Z79.4]   . Asthma [J45.909] 11/12/2015  . Acute respiratory failure ([place]) [J96.00] 11/11/2015  . TIA (transient ischemic attack) [G45.9] 11/01/2015  . Chronic diastolic CHF (congestive heart failure) ([place]) [I50.32] 11/01/2015  . Acute focal neurological deficit [R29.818] 11/01/2015  . 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 HLD (hyperlipidemia) [E78.5]   . Palpitations [R00.2]   . Stroke ([place]) [I63.9] 08/05/2015  . Hypertension [I10] 08/05/2015  . Stroke due to embolism of right posterior cerebral artery ([place]) [I63.431] 08/05/2015  . Diabetes mellitus 08/05/2015  . Alcohol dependency ([place]) [F10.20] 08/10/2013  . Cocaine abuse [F14.10] 08/08/2013  . Alcohol abuse [F10.10] 08/08/2013  . MDD (major depressive disorder) [F32.9] 08/08/2013    Total Time spent with patient: 45 minutes  Subjective:   Lisa Anderson is a 61 y.o. female patient admitted with suicide attempt.  HPI:   Patient who reports  history significant of  hypertension, diabetes, stroke, Major depressive disorder and severe cocaine use disorder. Patient reports worsening depressive symptoms characterized by hopelessness, lack of motivation,low energy level, poor sleep and suicidal thoughts. She reports been overwhelmed and stressed out due to her inability to stop using Cocaine. Patient reports suicide attempt prior to coming to ER by taking 10 pills of 100 mg of Trazodone which she intentionally took, patient states that : ''I wanted to sleep and not wake up.'' Patient denies psychosis, delusions or other illicit drug use. She is unable to contract for safety.  Past Psychiatric History: as above  Risk to Self: Suicidal Ideation: Yes-Currently Present Suicidal Intent: Yes-Currently Present Is patient at risk for suicide?: Yes Suicidal Plan?: Yes-Currently Present Specify Current Suicidal Plan: Overdose Access to Means: Yes Specify Access to Suicidal Means: Pt has medications What has been your use of drugs/alcohol within the last 12 months?: Current use How many times?: 3 Other Self Anderson Risks: NA Triggers for Past Attempts: Unknown Intentional Self Injurious Behavior: None Risk to Others: Homicidal Ideation: No Thoughts of Anderson to Others: No Current Homicidal Intent: No Current Homicidal Plan: No Access to Homicidal Means: No Identified Victim: NA History of Anderson to others?: No Assessment of Violence: None Noted Violent Behavior Description: NA Does patient have access to weapons?: No Criminal Charges Pending?: No Does patient have a court date: No Prior Inpatient Therapy: Prior Inpatient Therapy: Yes Prior Therapy Dates: 2014 Prior Therapy Facilty/Provider(s): [facility] Reason for Treatment: S/I, SA issues Prior Outpatient Therapy: Prior Outpatient Therapy: Yes Prior Therapy Dates: 2018 Prior Therapy Facilty/Provider(s): [facility] Reason for Treatment: Med mang Does patient have an ACCT team?: No Does patient have Intensive In-House  Services?  : No Does patient have [facility] services? : Yes Does patient have P4CC  services?: No  Past Medical History:  Past Medical History:  Diagnosis Date  . Asthma   . Cancer ([place])    breast  . Chronic congestive heart failure with left ventricular diastolic dysfunction ([place])   . Diabetes mellitus   . Hyperlipidemia   . Hypertension   . Stroke [facility])     Past Surgical History:  Procedure Laterality Date  . BREAST LUMPECTOMY    . EP IMPLANTABLE DEVICE N/A 08/07/2015   Procedure: Loop Recorder Insertion;  Surgeon: Thompson Grayer, MD;  Location: [facility];  Service: Cardiovascular;  Laterality: N/A;  . TEE WITHOUT CARDIOVERSION N/A 08/07/2015   Procedure: TRANSESOPHAGEAL ECHOCARDIOGRAM (TEE);  Surgeon: Sueanne Margarita, MD;  Location: [facility] ENDOSCOPY;  Service: Cardiovascular;  Laterality: N/A;  . TONSILLECTOMY     Family History:  Family History  Problem Relation Age of Onset  . Diabetes Mother   . Hyperlipidemia Mother   . Hypertension Mother   . Breast cancer Mother   . Rheum arthritis Father   . Asthma Father   . Stroke Father   . Thyroid disease Father   . Thyroid disease Brother   . Colon cancer Brother        in his 21s  . Breast cancer Sister    Family Psychiatric  History:  Social History:  History  Alcohol Use  . 3.6 oz/week  . 6 Cans of beer per week    Comment: occasionally     History  Drug Use  . Types: "Crack" cocaine, Cocaine    Comment: crack cocaine    Social History   Social History  . Marital status: Single    Spouse name: N/A  . Number of children: 5  . Years of education: 12   Occupational History  . CNA- [employer]    Social History Main Topics  . Smoking status: Current Every Day Smoker    Packs/day: 0.50  . Smokeless tobacco: Never Used  . Alcohol use 3.6 oz/week    6 Cans of beer per week     Comment: occasionally  . Drug use: Yes    Types: "Crack" cocaine, Cocaine     Comment: crack cocaine  . Sexual  activity: No     Comment: recovering cocaine addict x6 months   Other Topics Concern  . None   Social History Narrative   Lives with daughter      Caffeine use: Drinks soda (2- 20 oz soda per day)   Additional Social History:    Allergies:   Allergies  Allergen Reactions  . Prednisone Hives and Other (See Comments)    Made her "crazy", suicidal  . Benicar [Olmesartan] Other (See Comments)    Did not work at all for patient  . Celexa [Citalopram] Other (See Comments)    Made patient feel crazy, fluoxetine is fine  . Effexor [Venlafaxine] Other (See Comments)    Made patient feel crazy  . Shrimp [Shellfish Allergy] Hives and Swelling    Labs:  Results for orders placed or performed during the hospital encounter of 05/19/17 (from the past 48 hour(s))  Acetaminophen level     Status: Abnormal   Collection Time: 05/19/17  1:23 PM  Result Value Ref Range   Acetaminophen (Tylenol), Serum <10 (L) 10 - 30 ug/mL    Comment:        THERAPEUTIC CONCENTRATIONS VARY SIGNIFICANTLY. A RANGE OF 10-30 ug/mL MAY BE AN EFFECTIVE CONCENTRATION FOR MANY PATIENTS. HOWEVER, SOME ARE BEST  TREATED AT CONCENTRATIONS OUTSIDE THIS RANGE. ACETAMINOPHEN CONCENTRATIONS >150 ug/mL AT 4 HOURS AFTER INGESTION AND >50 ug/mL AT 12 HOURS AFTER INGESTION ARE OFTEN ASSOCIATED WITH TOXIC REACTIONS.   CK     Status: None   Collection Time: 05/19/17  4:23 PM  Result Value Ref Range   Total CK 74 38 - 234 U/L  Basic metabolic panel     Status: Abnormal   Collection Time: 05/19/17  4:23 PM  Result Value Ref Range   Sodium 146 (H) 135 - 145 mmol/L    Comment: DELTA CHECK NOTED REPEATED TO VERIFY    Potassium 3.5 3.5 - 5.1 mmol/L   Chloride 117 (H) 101 - 111 mmol/L   CO2 26 22 - 32 mmol/L   Glucose, Bld 185 (H) 65 - 99 mg/dL   BUN 10 6 - 20 mg/dL   Creatinine, Ser 0.95 0.44 - 1.00 mg/dL   Calcium 7.5 (L) 8.9 - 10.3 mg/dL   GFR calc non Af Amer >60 >60 mL/min   GFR calc Af Amer >60 >60 mL/min     Comment: (NOTE) The eGFR has been calculated using the CKD EPI equation. This calculation has not been validated in all clinical situations. eGFR's persistently <60 mL/min signify possible Chronic Kidney Disease.    Anion gap 3 (L) 5 - 15  MRSA PCR Screening     Status: None   Collection Time: 05/19/17  6:53 PM  Result Value Ref Range   MRSA by PCR NEGATIVE NEGATIVE    Comment:        The GeneXpert MRSA Assay (FDA approved for NASAL specimens only), is one component of a comprehensive MRSA colonization surveillance program. It is not intended to diagnose MRSA infection nor to guide or monitor treatment for MRSA infections.   Glucose, capillary     Status: Abnormal   Collection Time: 05/19/17  9:16 PM  Result Value Ref Range   Glucose-Capillary 155 (H) 65 - 99 mg/dL   Comment 1 Notify RN    Comment 2 Document in Chart   Glucose, capillary     Status: Abnormal   Collection Time: 05/19/17 11:43 PM  Result Value Ref Range   Glucose-Capillary 117 (H) 65 - 99 mg/dL   Comment 1 Notify RN    Comment 2 Document in Chart   Basic metabolic panel     Status: Abnormal   Collection Time: 05/20/17  3:10 AM  Result Value Ref Range   Sodium 146 (H) 135 - 145 mmol/L   Potassium 3.4 (L) 3.5 - 5.1 mmol/L   Chloride 117 (H) 101 - 111 mmol/L   CO2 26 22 - 32 mmol/L   Glucose, Bld 101 (H) 65 - 99 mg/dL   BUN 10 6 - 20 mg/dL   Creatinine, Ser 1.00 0.44 - 1.00 mg/dL   Calcium 7.7 (L) 8.9 - 10.3 mg/dL   GFR calc non Af Amer >60 >60 mL/min   GFR calc Af Amer >60 >60 mL/min    Comment: (NOTE) The eGFR has been calculated using the CKD EPI equation. This calculation has not been validated in all clinical situations. eGFR's persistently <60 mL/min signify possible Chronic Kidney Disease.    Anion gap 3 (L) 5 - 15  Glucose, capillary     Status: None   Collection Time: 05/20/17  8:22 AM  Result Value Ref Range   Glucose-Capillary 88 65 - 99 mg/dL   Comment 1 Notify RN    Comment 2  Document in Chart  Glucose, capillary     Status: Abnormal   Collection Time: 05/20/17 12:07 PM  Result Value Ref Range   Glucose-Capillary 189 (H) 65 - 99 mg/dL  Glucose, capillary     Status: Abnormal   Collection Time: 05/20/17  5:15 PM  Result Value Ref Range   Glucose-Capillary 127 (H) 65 - 99 mg/dL   Comment 1 Document in Chart   Glucose, capillary     Status: Abnormal   Collection Time: 05/20/17  9:21 PM  Result Value Ref Range   Glucose-Capillary 151 (H) 65 - 99 mg/dL   Comment 1 Document in Chart   Basic metabolic panel     Status: Abnormal   Collection Time: 05/21/17  6:25 AM  Result Value Ref Range   Sodium 143 135 - 145 mmol/L   Potassium 3.6 3.5 - 5.1 mmol/L   Chloride 112 (H) 101 - 111 mmol/L   CO2 27 22 - 32 mmol/L   Glucose, Bld 90 65 - 99 mg/dL   BUN 15 6 - 20 mg/dL   Creatinine, Ser 1.14 (H) 0.44 - 1.00 mg/dL   Calcium 8.5 (L) 8.9 - 10.3 mg/dL   GFR calc non Af Amer 52 (L) >60 mL/min   GFR calc Af Amer 60 (L) >60 mL/min    Comment: (NOTE) The eGFR has been calculated using the CKD EPI equation. This calculation has not been validated in all clinical situations. eGFR's persistently <60 mL/min signify possible Chronic Kidney Disease.    Anion gap 4 (L) 5 - 15  Glucose, capillary     Status: None   Collection Time: 05/21/17  7:37 AM  Result Value Ref Range   Glucose-Capillary 69 65 - 99 mg/dL   Comment 1 Notify RN    Comment 2 Document in Chart   Glucose, capillary     Status: Abnormal   Collection Time: 05/21/17  8:35 AM  Result Value Ref Range   Glucose-Capillary 127 (H) 65 - 99 mg/dL  Glucose, capillary     Status: Abnormal   Collection Time: 05/21/17 12:17 PM  Result Value Ref Range   Glucose-Capillary 190 (H) 65 - 99 mg/dL    Current Facility-Administered Medications  Medication Dose Route Frequency Provider Last Rate Last Dose  . albuterol (PROVENTIL) (2.5 MG/3ML) 0.083% nebulizer solution 2.5 mg  2.5 mg Nebulization Q6H PRN Patrecia Pour,  Christean Grief, MD      . aspirin EC tablet 162 mg  162 mg Oral Daily Patrecia Pour, Christean Grief, MD   162 mg at 05/21/17 1039  . budesonide (PULMICORT) nebulizer solution 0.5 mg  0.5 mg Nebulization BID Patrecia Pour, Christean Grief, MD   0.5 mg at 05/20/17 2000  . clopidogrel (PLAVIX) tablet 75 mg  75 mg Oral Daily Patrecia Pour, Christean Grief, MD   75 mg at 05/21/17 1039  . diltiazem (CARDIZEM CD) 24 hr capsule 360 mg  360 mg Oral Daily Patrecia Pour, Christean Grief, MD   360 mg at 05/21/17 1039  . DULoxetine (CYMBALTA) DR capsule 60 mg  60 mg Oral Daily Patrecia Pour, Christean Grief, MD   60 mg at 05/21/17 1038  . enoxaparin (LOVENOX) injection 40 mg  40 mg Subcutaneous Q24H Patrecia Pour, Christean Grief, MD   40 mg at 05/20/17 1930  . gabapentin (NEURONTIN) capsule 200 mg  200 mg Oral BID Berdia Lachman, MD      . hydrALAZINE (APRESOLINE) injection 5 mg  5 mg Intravenous Q6H PRN Patrecia Pour, Christean Grief, MD   5 mg at 05/20/17 1215  .  hydrALAZINE (APRESOLINE) tablet 50 mg  50 mg Oral TID Patrecia Pour, Christean Grief, MD   50 mg at 05/21/17 1039  . insulin aspart (novoLOG) injection 0-15 Units  0-15 Units Subcutaneous TID WC Patrecia Pour, Christean Grief, MD   2 Units at 05/20/17 1827  . insulin glargine (LANTUS) injection 15 Units  15 Units Subcutaneous QHS Patrecia Pour, Christean Grief, MD   15 Units at 05/20/17 2125  . ketorolac (TORADOL) 15 MG/ML injection 15 mg  15 mg Intravenous Q6H PRN Patrecia Pour, Christean Grief, MD   15 mg at 05/20/17 1306  . ondansetron (ZOFRAN) tablet 4 mg  4 mg Oral Q6H PRN Patrecia Pour, Christean Grief, MD       Or  . ondansetron [facility]) injection 4 mg  4 mg Intravenous Q6H PRN Patrecia Pour, Christean Grief, MD      . senna-docusate (Senokot-S) tablet 1 tablet  1 tablet Oral QHS PRN Doreatha Lew, MD        Musculoskeletal: Strength & Muscle Tone: within normal limits Gait & Station: normal Patient leans: N/A  Psychiatric Specialty Exam: Physical Exam  Psychiatric: Judgment normal. Her speech is delayed. She is slowed and withdrawn. Cognition and memory are normal. She  exhibits a depressed mood. She expresses suicidal ideation. She expresses suicidal plans.    Review of Systems  Constitutional: Positive for malaise/fatigue.  HENT: Negative.   Eyes: Negative.   Respiratory: Negative.   Cardiovascular: Negative.   Gastrointestinal: Negative.   Genitourinary: Negative.   Musculoskeletal: Negative.   Skin: Negative.   Neurological: Negative.   Endo/Heme/Allergies: Negative.   Psychiatric/Behavioral: Positive for depression, substance abuse and suicidal ideas. The patient has insomnia.     Blood pressure 134/65, pulse 73, temperature 98.8 F (37.1 C), temperature source Oral, resp. rate 18, height '5\' 5"'  (1.651 m), weight 75.4 kg (166 lb 3.6 oz), SpO2 99 %.Body mass index is 27.66 kg/m.  General Appearance: Casual  Eye Contact:  Minimal  Speech:  Clear and Coherent and Slow  Volume:  Decreased  Mood:  Depressed and Dysphoric  Affect:  Tearful  Thought Process:  Coherent and Descriptions of Associations: Intact  Orientation:  Full (Time, Place, and Person)  Thought Content:  Logical  Suicidal Thoughts:  Yes.  with intent/plan  Homicidal Thoughts:  No  Memory:  Immediate;   Fair Recent;   Fair Remote;   Good  Judgement:  Poor  Insight:  Shallow  Psychomotor Activity:  Decreased and Psychomotor Retardation  Concentration:  Concentration: Fair and Attention Span: Fair  Recall:  [employer] of Knowledge:  Fair  Language:  Good  Akathisia:  No  Handed:  Right  AIMS (if indicated):     Assets:  Communication Skills Social Support  ADL's:  Intact  Cognition:  WNL  Sleep:   poor     Treatment Plan Summary: Daily contact with patient to assess and evaluate symptoms and progress in treatment and Medication management  -Continue Cymbalta -Wean patient off Lexapro -Start Gabapentin 272m twice daily for cocaine/insomnia  Disposition: Recommend psychiatric Inpatient admission when medically cleared.  ACorena Pilgrim MD 05/21/2017 12:46 PM

## 2017-05-22 ENCOUNTER — Encounter (HOSPITAL_COMMUNITY): Payer: Self-pay

## 2017-05-22 ENCOUNTER — Inpatient Hospital Stay (HOSPITAL_COMMUNITY)
Admission: AD | Admit: 2017-05-22 | Discharge: 2017-05-26 | DRG: 885 | Disposition: A | Discharge status: Home / Self Care | Payer: Self-pay | Source: Intra-hospital | Attending: Psychiatry | Admitting: Psychiatry

## 2017-05-22 DIAGNOSIS — E1151 Type 2 diabetes mellitus with 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 gangrene: Secondary | ICD-10-CM

## 2017-05-22 DIAGNOSIS — Z91013 Allergy to seafood: Secondary | ICD-10-CM

## 2017-05-22 DIAGNOSIS — F319 Bipolar disorder, unspecified: Secondary | ICD-10-CM

## 2017-05-22 DIAGNOSIS — E785 Hyperlipidemia, unspecified: Secondary | ICD-10-CM | POA: Diagnosis present

## 2017-05-22 DIAGNOSIS — Z8673 Personal history of transient ischemic attack (TIA), and cerebral infarction without residual deficits: Secondary | ICD-10-CM

## 2017-05-22 DIAGNOSIS — E1159 Type 2 diabetes mellitus with other circulatory complications: Secondary | ICD-10-CM | POA: Diagnosis present

## 2017-05-22 DIAGNOSIS — Z7982 Long term (current) use of aspirin: Secondary | ICD-10-CM

## 2017-05-22 DIAGNOSIS — Z79899 Other long term (current) drug therapy: Secondary | ICD-10-CM

## 2017-05-22 DIAGNOSIS — F17210 Nicotine dependence, cigarettes, uncomplicated: Secondary | ICD-10-CM | POA: Diagnosis present

## 2017-05-22 DIAGNOSIS — Z888 Allergy status to other drugs, medicaments and biological substances status: Secondary | ICD-10-CM

## 2017-05-22 DIAGNOSIS — F1011 Alcohol abuse, in remission: Secondary | ICD-10-CM | POA: Diagnosis present

## 2017-05-22 DIAGNOSIS — Z86718 Personal history of other venous thrombosis and embolism: Secondary | ICD-10-CM

## 2017-05-22 DIAGNOSIS — T501X6A Underdosing of loop [high-ceiling] diuretics, initial encounter: Secondary | ICD-10-CM | POA: Diagnosis present

## 2017-05-22 DIAGNOSIS — R45851 Suicidal ideations: Secondary | ICD-10-CM | POA: Diagnosis present

## 2017-05-22 DIAGNOSIS — I110 Hypertensive heart disease with heart failure: Secondary | ICD-10-CM | POA: Diagnosis present

## 2017-05-22 DIAGNOSIS — Y900 Blood alcohol level of less than 20 mg/100 ml: Secondary | ICD-10-CM | POA: Diagnosis present

## 2017-05-22 DIAGNOSIS — T50902D Poisoning by unspecified drugs, medicaments and biological substances, intentional self-harm, subsequent encounter: Secondary | ICD-10-CM

## 2017-05-22 DIAGNOSIS — Z886 Allergy status to analgesic agent status: Secondary | ICD-10-CM

## 2017-05-22 DIAGNOSIS — Z7902 Long term (current) use of antithrombotics/antiplatelets: Secondary | ICD-10-CM

## 2017-05-22 DIAGNOSIS — J45909 Unspecified asthma, uncomplicated: Secondary | ICD-10-CM | POA: Diagnosis present

## 2017-05-22 DIAGNOSIS — K5900 Constipation, unspecified: Secondary | ICD-10-CM | POA: Diagnosis present

## 2017-05-22 DIAGNOSIS — M549 Dorsalgia, unspecified: Secondary | ICD-10-CM | POA: Diagnosis present

## 2017-05-22 DIAGNOSIS — F419 Anxiety disorder, unspecified: Secondary | ICD-10-CM | POA: Diagnosis present

## 2017-05-22 DIAGNOSIS — I5032 Chronic diastolic (congestive) heart failure: Secondary | ICD-10-CM | POA: Diagnosis present

## 2017-05-22 DIAGNOSIS — F1410 Cocaine abuse, uncomplicated: Secondary | ICD-10-CM | POA: Diagnosis present

## 2017-05-22 DIAGNOSIS — F332 Major depressive disorder, recurrent severe without psychotic features: Principal | ICD-10-CM | POA: Diagnosis present

## 2017-05-22 DIAGNOSIS — Z7951 Long term (current) use of inhaled steroids: Secondary | ICD-10-CM

## 2017-05-22 DIAGNOSIS — F1420 Cocaine dependence, uncomplicated: Secondary | ICD-10-CM | POA: Diagnosis present

## 2017-05-22 DIAGNOSIS — Z95818 Presence of other cardiac implants and grafts: Secondary | ICD-10-CM

## 2017-05-22 DIAGNOSIS — Z794 Long term (current) use of insulin: Secondary | ICD-10-CM

## 2017-05-22 DIAGNOSIS — Z915 Personal history of self-harm: Secondary | ICD-10-CM

## 2017-05-22 LAB — GLUCOSE, CAPILLARY
GLUCOSE-CAPILLARY: 97 mg/dL (ref 65–99)
GLUCOSE-CAPILLARY: 141 mg/dL — AB (ref 65–99)
Glucose-Capillary: 196 mg/dL — ABNORMAL HIGH (ref 65–99)
Glucose-Capillary: 227 mg/dL — ABNORMAL HIGH (ref 65–99)

## 2017-05-22 LAB — URINALYSIS, ROUTINE W REFLEX MICROSCOPIC
BILIRUBIN URINE: NEGATIVE
Glucose, UA: 500 mg/dL — AB
HGB URINE DIPSTICK: NEGATIVE
KETONES UR: NEGATIVE mg/dL
Leukocytes, UA: NEGATIVE
Nitrite: NEGATIVE
PROTEIN: NEGATIVE mg/dL
Specific Gravity, Urine: <1.005 — ABNORMAL LOW (ref 1.005–1.030)
pH: 6 (ref 5.0–8.0)

## 2017-05-22 LAB — RENAL FUNCTION PANEL
Albumin: 3.1 g/dL — ABNORMAL LOW (ref 3.5–5.0)
Anion gap: 5 (ref 5–15)
BUN: 16 mg/dL (ref 6–20)
CALCIUM: 8.4 mg/dL — AB (ref 8.9–10.3)
CHLORIDE: 107 mmol/L (ref 101–111)
CO2: 28 mmol/L (ref 22–32)
CREATININE: 1.31 mg/dL — AB (ref 0.44–1.00)
GFR, EST AFRICAN AMERICAN: 51 mL/min — AB (ref >60)
GFR, EST NON AFRICAN AMERICAN: 44 mL/min — AB (ref >60)
Glucose, Bld: 133 mg/dL — ABNORMAL HIGH (ref 65–99)
Phosphorus: 3.9 mg/dL (ref 2.5–4.6)
Potassium: 3.2 mmol/L — ABNORMAL LOW (ref 3.5–5.1)
Sodium: 140 mmol/L (ref 135–145)

## 2017-05-22 LAB — NA AND K (SODIUM & POTASSIUM), RAND UR
Potassium Urine: 12 mmol/L
Sodium, Ur: 68 mmol/L

## 2017-05-22 LAB — CREATININE, URINE, RANDOM: CREATININE, URINE: 18.63 mg/dL

## 2017-05-22 MED ORDER — HYDROXYZINE HCL 25 MG PO TABS: 25.0000 mg | ORAL_TABLET | Freq: Three times a day (TID) | ORAL | Status: DC | PRN

## 2017-05-22 MED ORDER — METFORMIN HCL 500 MG PO TABS
1000.0000 mg | ORAL_TABLET | Freq: Two times a day (BID) | ORAL | Status: DC
  Administered 2017-05-22 – 2017-05-26 (×8): 1000 mg via ORAL
  Filled 2017-05-22 (×13): qty 2

## 2017-05-22 MED ORDER — GABAPENTIN 100 MG PO CAPS
200.0000 mg | ORAL_CAPSULE | Freq: Two times a day (BID) | ORAL | Status: DC
  Administered 2017-05-22 – 2017-05-23 (×2): 200 mg via ORAL
  Filled 2017-05-22 (×5): qty 2

## 2017-05-22 MED ORDER — INSULIN ASPART 100 UNIT/ML ~~LOC~~ SOLN
0.0000 [IU] | Freq: Three times a day (TID) | SUBCUTANEOUS | Status: DC
  Administered 2017-05-22: 5 [IU] via SUBCUTANEOUS
  Administered 2017-05-23 – 2017-05-24 (×2): 2 [IU] via SUBCUTANEOUS
  Administered 2017-05-24: 3 [IU] via SUBCUTANEOUS
  Administered 2017-05-25: 2 [IU] via SUBCUTANEOUS

## 2017-05-22 MED ORDER — SODIUM CHLORIDE 0.9 % IV BOLUS (SEPSIS)
1000.0000 mL | Freq: Once | INTRAVENOUS | Status: AC
  Administered 2017-05-22: 1000 mL via INTRAVENOUS

## 2017-05-22 MED ORDER — HYDRALAZINE HCL 25 MG PO TABS
50.0000 mg | ORAL_TABLET | Freq: Three times a day (TID) | ORAL | Status: DC
  Administered 2017-05-22 – 2017-05-26 (×12): 50 mg via ORAL
  Filled 2017-05-22 (×2): qty 2
  Filled 2017-05-22: qty 1
  Filled 2017-05-22 (×5): qty 2
  Filled 2017-05-22: qty 1
  Filled 2017-05-22 (×11): qty 2

## 2017-05-22 MED ORDER — SENNOSIDES-DOCUSATE SODIUM 8.6-50 MG PO TABS: 1.0000 | ORAL_TABLET | Freq: Every evening | ORAL | Status: DC | PRN

## 2017-05-22 MED ORDER — HYDROXYZINE HCL 50 MG PO TABS
50.0000 mg | ORAL_TABLET | Freq: Every evening | ORAL | Status: DC | PRN
  Administered 2017-05-22 – 2017-05-25 (×3): 50 mg via ORAL
  Filled 2017-05-22 (×2): qty 1
  Filled 2017-05-22: qty 10
  Filled 2017-05-22: qty 1

## 2017-05-22 MED ORDER — CLOPIDOGREL BISULFATE 75 MG PO TABS
75.0000 mg | ORAL_TABLET | Freq: Every day | ORAL | Status: DC
  Administered 2017-05-23 – 2017-05-26 (×4): 75 mg via ORAL
  Filled 2017-05-22 (×6): qty 1

## 2017-05-22 MED ORDER — TRAZODONE HCL 150 MG PO TABS: 150.0000 mg | ORAL_TABLET | Freq: Every day | ORAL | Status: DC

## 2017-05-22 MED ORDER — DILTIAZEM HCL ER COATED BEADS 360 MG PO CP24
360.0000 mg | ORAL_CAPSULE | Freq: Every day | ORAL | Status: DC
  Administered 2017-05-23 – 2017-05-26 (×4): 360 mg via ORAL
  Filled 2017-05-22 (×6): qty 1

## 2017-05-22 MED ORDER — POTASSIUM CHLORIDE CRYS ER 20 MEQ PO TBCR
40.0000 meq | EXTENDED_RELEASE_TABLET | ORAL | Status: AC
  Administered 2017-05-22 (×2): 40 meq via ORAL
  Filled 2017-05-22: qty 2

## 2017-05-22 MED ORDER — ALBUTEROL SULFATE HFA 108 (90 BASE) MCG/ACT IN AERS: 2.0000 | INHALATION_SPRAY | Freq: Four times a day (QID) | RESPIRATORY_TRACT | Status: DC | PRN

## 2017-05-22 MED ORDER — INSULIN GLARGINE 100 UNIT/ML ~~LOC~~ SOLN
15.0000 [IU] | Freq: Every day | SUBCUTANEOUS | Status: DC
  Administered 2017-05-23 – 2017-05-25 (×3): 15 [IU] via SUBCUTANEOUS

## 2017-05-22 MED ORDER — ASPIRIN EC 81 MG PO TBEC
162.0000 mg | DELAYED_RELEASE_TABLET | Freq: Every day | ORAL | Status: DC
  Administered 2017-05-23 – 2017-05-26 (×4): 162 mg via ORAL
  Filled 2017-05-22 (×8): qty 2

## 2017-05-22 MED ORDER — DULOXETINE HCL 60 MG PO CPEP
60.0000 mg | ORAL_CAPSULE | Freq: Every day | ORAL | Status: DC
  Administered 2017-05-23 – 2017-05-26 (×4): 60 mg via ORAL
  Filled 2017-05-22 (×5): qty 1
  Filled 2017-05-22: qty 7

## 2017-05-22 MED ORDER — GABAPENTIN 100 MG PO CAPS: 200.0000 mg | ORAL_CAPSULE | Freq: Two times a day (BID) | ORAL | Status: DC

## 2017-05-22 MED ORDER — INSULIN ASPART 100 UNIT/ML ~~LOC~~ SOLN: 0.0000 [IU] | Freq: Three times a day (TID) | SUBCUTANEOUS | Status: DC

## 2017-05-22 NOTE — Care Management Note (Signed)
Case Management Note  Patient Details  Name: Lisa Anderson MRN: [id number] Date of Birth: June 10, 1956  Subjective/Objective:    overdose                Action/Plan: Date:  May 22, 2017  Chart reviewed for concurrent status and case management needs.  Will continue to follow patient progress.  Discharge Planning: following for needs  Expected discharge date: [id number]  Velva Harman, BSN, [place], [place]   Expected Discharge Date:   (UNKNOWN)               Expected Discharge Plan:  Home/Self Care  In-House Referral:  Clinical Social Work  Discharge planning Services  CM Consult  Post Acute Care Choice:    Choice offered to:     DME Arranged:    DME Agency:     HH Arranged:    [place] Agency:     Status of Service:  In process, will continue to follow  If discussed at Long Length of Stay Meetings, dates discussed:    Additional Comments:  Leeroy Cha, RN 05/22/2017, 9:36 AM

## 2017-05-22 NOTE — Tx Team (Signed)
Initial Treatment Plan 05/22/2017 8:57 PM [name] RFX:[id number]    PATIENT STRESSORS: Financial difficulties Health problems Marital or family conflict Substance abuse   PATIENT STRENGTHS: Capable of independent living Communication skills General fund of knowledge Work skills   PATIENT IDENTIFIED PROBLEMS: Depression  Suicidal ideation  Substance abuse  "I would like to go to the [employer]"  "Get with people like me and get to the meetings"             DISCHARGE CRITERIA:  Improved stabilization in mood, thinking, and/or behavior Verbal commitment to aftercare and medication compliance Withdrawal symptoms are absent or subacute and managed without 24-hour nursing intervention  PRELIMINARY DISCHARGE PLAN: Outpatient therapy Medication management  PATIENT/FAMILY INVOLVEMENT: This treatment plan has been presented to and reviewed with the patient, Lisa Anderson.  The patient and family have been given the opportunity to ask questions and make suggestions.  Windell Moment, RN 05/22/2017, 8:57 PM

## 2017-05-22 NOTE — Clinical Social Work Note (Signed)
Clinical Social Work Assessment  Patient Details  Name: Lisa Anderson MRN: [id number] Date of Birth: May 16, 1956  Date of referral:  05/22/17               Reason for consult:  Substance Use/ETOH Abuse, Suicide Risk/Attempt (Intention Overdose)                Permission sought to share information with:    Permission granted to share information::     Name::        Agency::     Relationship::   Daughter   Contact Information:     Housing/Transportation Living arrangements for the past 2 months:  Single Family Home Source of Information:  Patient Patient Interpreter Needed:  None Criminal Activity/Legal Involvement Pertinent to Current Situation/Hospitalization:  No - Comment as needed Significant Relationships:  Adult Children, Dependent Children Lives with:  Adult Children Do you feel safe going back to the place where you live?  Yes Need for family participation in patient care:  Yes (Comment)  Care giving concerns:  Patient admitted for intentional overdose. Hx. Bipolar and Depression. Illicit drug use.  Patient seen by psychiatrist and recommends further evaluation at inpatient  psychiatric hospital.   Social Worker assessment / plan: CSW met with patient at bedside, explained role and reason for visit. Patient reports, she intentionally tried  to end her life after watching her daughter cry because of her drug use. Patient took ten trazodone and called EMS immediately afterwards.  Patient reports she has been using crack cocaine for ten years. She reports, "it controls me, I have tried years to take control of my own life again." She intentionally overdosed two times prior years ago.Patient reports she has been to several rehab facilities in [place] including a behavioral health hospital in [place] and multiple residential facilities.Patient went to narcotics anonymous meetings daily for awhile then stopped going after she relapsed.  Patient reports she recently lost a good job  because of her drug use and reports loosing several jobs in the past. She worries about never having a steady job to support herself and family. Patient also worries about her grandchildren learning about her addiction and "no longer loving me."   Medications: Cymbalta & Lexapro Patient reports her primary physician prescribes her medications. She does not have an outpatient therapist of counselor. Patient is open to see therapist after [facility] stay.   Plan: Inpatient [facility]  Patient voluntary formed faxed to [phone] Accepted to [facility] Room 400 Bed 1 Accepting [place] Attending: Dr. Domingo Cocking scheduled for transport 3:30-4:00   Employment status:  Unemployed Insurance information:  Other (Comment Required) PT Recommendations:  Not assessed at this time Information / Referral to community resources:  Inpatient Psychiatric Care (Comment Required)  Patient/Family's Response to care:  Patient is voluntary and agreeble to psychiatric care.   Patient/Family's Understanding of and Emotional Response to Diagnosis, Current Treatment, and Prognosis: "My children have disowned me because of my addiction. I have one daughter that I currently live with and she cares about my life. I keep disappointing her, I  Need help." Patient family not involved in care or treatment. Patient prefers CSW not update daughter. "she needs time to cool off."  Emotional Assessment Appearance:  Appears stated age Attitude/Demeanor/Rapport:    Affect (typically observed):  Accepting, Depressed Orientation:  Oriented to Self, Oriented to Place, Oriented to  Time, Oriented to Situation Alcohol / Substance use:  Illicit Drugs Psych involvement (Current and /or  in the community):  Yes   Discharge Needs  Concerns to be addressed:  Discharge Planning Concerns, Substance Abuse Concerns, Mental Health Concerns Readmission within the last 30 days:  Yes Current discharge risk:  Substance Abuse, Psychiatric  Illness Barriers to Discharge:  No Barriers Identified   Lia Hopping, [place] 05/22/2017, 12:43 PM

## 2017-05-22 NOTE — Progress Notes (Signed)
CSW following for discharge needs. CSW provided [facility] w/ pt. Information for placement. Will inform medical staff when bed is available.   Kathrin Greathouse, Latanya Presser, MSW Clinical Social Worker 5E and Psychiatric Service Line [phone] 05/22/2017  10:24 AM

## 2017-05-22 NOTE — Discharge Summary (Signed)
Physician Discharge Summary  Lisa Anderson  WER:[id number]  DOB: 10-02-1955  DOA: 05/19/2017 PCP: Arnoldo Morale, MD  Admit date: 05/19/2017 Discharge date: 05/22/2017  Admitted From: Home  Disposition:  [facility]  Recommendations for Outpatient Follow-up:  1. Follow up with PCP in 1 weeks 2. Please obtain BMP/CBC in one week to monitor renal function and hgb   Discharge Condition: Stable  CODE STATUS: FULL  Diet recommendation: Heart Healthy   Brief/Interim Summary:  For full details see H&P and progress notes but in brief, Lisa Anderson is a 61 year old female with medical history significant for hypertension, diabetes, stroke and severe depression presented to the emergency department after a suicide attempt. She took 10 pills of 150 mg of trazodone. She was admitted to monitor cardiac arrhythmias and psych evaluation. EKG showed prolonged QTC which subsequently resolved with supportive measures. Psychiatry evaluated patient and recommended inpatient psych admission. During hospital stay patient remained stable, no acute events occurred and she was medically cleared for transfer to psych floor.  Subjective:  Patient seen and examined on the day of discharge. She reports no complaints at this time although continues to have suicidal thoughts. Patient is tolerating diet well, afebrile and ambulating without issues.   Discharge Diagnoses/Hospital Course:  Intentional drug overdose with trazodone/suicidal attempt Patient hemodynamically stable. Patient also cocaine user See below  Severe depression Psych consult appreciated patient needs inpatient psych This is her fourth suicide attempt she might benefit from ECT deferred to psych. Patient started on Cymbalta and Lexapro, psychiatry recommended weaning off Lexapro and start gabapentin  Prolong QTc  - resolved Due to trazodone overdose EKG monitoring, QTc normalized  Hypotension likely due to trazodone overdose - Resolved  D/c IVF    DM type 2  CBGs stable during hospital stay Treated with insulin Can resume home regimen  No changes in meds were made during hospital stay  CKD stage III Cr near baseline Patient given IVF for supportive treatment Check BMP in 1 week  HTN BP stabilized during hospital stay Home BP meds resumed without further changes  Hx of stroke  Continue plavix and ASA   All other chronic medical condition were stable during the hospitalization.  On the day of the discharge the patient's vitals were stable, and no other acute medical condition were reported by patient. the patient was felt safe to be discharge to psych floor.  Discharge Instructions  You were cared for by a hospitalist during your hospital stay. If you have any questions about your discharge medications or the care you received while you were in the hospital after you are discharged, you can call the unit and asked to speak with the hospitalist on call if the hospitalist that took care of you is not available. Once you are discharged, your primary care physician will handle any further medical issues. Please note that NO REFILLS for any discharge medications will be authorized once you are discharged, as it is imperative that you return to your primary care physician (or establish a relationship with a primary care physician if you do not have one) for your aftercare needs so that they can reassess your need for medications and monitor your lab values.  Discharge Instructions    Call MD for:  difficulty breathing, headache or visual disturbances    Complete by:  As directed    Call MD for:  extreme fatigue    Complete by:  As directed    Call MD for:  hives    Complete  by:  As directed    Call MD for:  persistant dizziness or light-headedness    Complete by:  As directed    Call MD for:  persistant nausea and vomiting    Complete by:  As directed    Call MD for:  redness, tenderness, or signs of infection (pain,  swelling, redness, odor or green/yellow discharge around incision site)    Complete by:  As directed    Call MD for:  severe uncontrolled pain    Complete by:  As directed    Call MD for:  temperature >100.4    Complete by:  As directed    Diet - low sodium heart healthy    Complete by:  As directed    Increase activity slowly    Complete by:  As directed      Allergies as of 05/22/2017      Reactions   Prednisone Hives, Other (See Comments)   Made her "crazy", suicidal   Benicar [olmesartan] Other (See Comments)   Did not work at all for patient   Celexa [citalopram] Other (See Comments)   Made patient feel crazy, fluoxetine is fine   Effexor [venlafaxine] Other (See Comments)   Made patient feel crazy   Shrimp [shellfish Allergy] Hives, Swelling      Medication List    STOP taking these medications   cyclobenzaprine 10 MG tablet Commonly known as:  FLEXERIL   escitalopram 20 MG tablet Commonly known as:  LEXAPRO   folic acid 1 MG tablet Commonly known as:  FOLVITE   furosemide 40 MG tablet Commonly known as:  LASIX   naproxen sodium 220 MG tablet Commonly known as:  ANAPROX   oxyCODONE-acetaminophen 5-325 MG tablet Commonly known as:  PERCOCET   predniSONE 20 MG tablet Commonly known as:  DELTASONE   traZODone 150 MG tablet Commonly known as:  DESYREL     TAKE these medications   albuterol 108 (90 Base) MCG/ACT inhaler Commonly known as:  PROVENTIL HFA;VENTOLIN HFA Inhale 2 puffs into the lungs every 6 (six) hours as needed for wheezing or shortness of breath. Shortness of breath   aspirin EC 81 MG tablet Take 162 mg by mouth daily.   beclomethasone 80 MCG/ACT inhaler Commonly known as:  QVAR Inhale 1 puff into the lungs 2 (two) times daily. Rinse mouth with water after use to reduce aftertaste and incidence of candidiasis. Do not swallow.   clopidogrel 75 MG tablet Commonly known as:  PLAVIX Take 1 tablet (75 mg total) by mouth daily.   diltiazem  360 MG 24 hr capsule Commonly known as:  CARDIZEM CD Take 1 capsule (360 mg total) by mouth daily.   DULoxetine 60 MG capsule Commonly known as:  CYMBALTA Take 1 capsule (60 mg total) by mouth daily.   gabapentin 100 MG capsule Commonly known as:  NEURONTIN Take 2 capsules (200 mg total) by mouth 2 (two) times daily.   glipiZIDE 5 MG tablet Commonly known as:  GLUCOTROL Take 1 tablet (5 mg total) by mouth 2 (two) times daily before a meal.   hydrALAZINE 50 MG tablet Commonly known as:  APRESOLINE Take 1 tablet (50 mg total) by mouth 3 (three) times daily.   insulin aspart 100 UNIT/ML injection Commonly known as:  novoLOG Sliding scale CBG 70 - 120: 0 units CBG 121 - 150: 1 unit,  CBG 151 - 200: 2 units,  CBG 201 - 250: 3 units,  CBG 251 - 300: 5 units,  CBG 301 - 350: 7 units,  CBG 351 - 400: 9 units   CBG > 400: 9 units and notify your MD   insulin glargine 100 UNIT/ML injection Commonly known as:  LANTUS Inject 0.15 mLs (15 Units total) into the skin at bedtime.   metFORMIN 1000 MG tablet Commonly known as:  GLUCOPHAGE Take 1 tablet (1,000 mg total) by mouth 2 (two) times daily with a meal.   potassium chloride SA 20 MEQ tablet Commonly known as:  K-DUR,KLOR-CON Take 1 tablet (20 mEq total) by mouth daily.   rosuvastatin 20 MG tablet Commonly known as:  CRESTOR Take 2 tablets (40 mg total) by mouth daily.            Discharge Care Instructions        Start     Ordered   05/22/17 0000  Increase activity slowly     05/22/17 1544   05/22/17 0000  Diet - low sodium heart healthy     05/22/17 1544   05/22/17 0000  Call MD for:  temperature >100.4     05/22/17 1544   05/22/17 0000  Call MD for:  persistant nausea and vomiting     05/22/17 1544   05/22/17 0000  Call MD for:  severe uncontrolled pain     05/22/17 1544   05/22/17 0000  Call MD for:  redness, tenderness, or signs of infection (pain, swelling, redness, odor or green/yellow discharge around incision  site)     05/22/17 1544   05/22/17 0000  Call MD for:  difficulty breathing, headache or visual disturbances     05/22/17 1544   05/22/17 0000  Call MD for:  hives     05/22/17 1544   05/22/17 0000  Call MD for:  persistant dizziness or light-headedness     05/22/17 1544   05/22/17 0000  Call MD for:  extreme fatigue     05/22/17 1544      Allergies  Allergen Reactions  . Prednisone Hives and Other (See Comments)    Made her "crazy", suicidal  . Benicar [Olmesartan] Other (See Comments)    Did not work at all for patient  . Celexa [Citalopram] Other (See Comments)    Made patient feel crazy, fluoxetine is fine  . Effexor [Venlafaxine] Other (See Comments)    Made patient feel crazy  . Shrimp [Shellfish Allergy] Hives and Swelling    Consultations:  Psychiatry   Procedures/Studies: No results found.  Discharge Exam: Vitals:   05/22/17 0609 05/22/17 0737  BP: (!) 150/74   Pulse: 70   Resp: 18   Temp: 98 F (36.7 C)   SpO2: 98% 96%   Vitals:   05/21/17 1358 05/21/17 2042 05/22/17 0609 05/22/17 0737  BP: (!) 153/69 139/68 (!) 150/74   Pulse: 73 69 70   Resp: 18 18 18    Temp: 98.5 F (36.9 C) 97.9 F (36.6 C) 98 F (36.7 C)   TempSrc: Oral Oral Oral   SpO2: 98% 98% 98% 96%  Weight:      Height:        General: Pt is alert, awake, not in acute distress Cardiovascular: RRR, S1/S2 +, no rubs, no gallops Respiratory: CTA bilaterally, no wheezing, no rhonchi Abdominal: Soft, NT, ND, bowel sounds + Extremities: no edema, no cyanosis Psych: + SI, no AH or HI   The results of significant diagnostics from this hospitalization (including imaging, microbiology, ancillary and laboratory) are listed below for reference.  Microbiology: Recent Results (from the past 240 hour(s))  MRSA PCR Screening     Status: None   Collection Time: 05/19/17  6:53 PM  Result Value Ref Range Status   MRSA by PCR NEGATIVE NEGATIVE Final    Comment:        The GeneXpert MRSA  Assay (FDA approved for NASAL specimens only), is one component of a comprehensive MRSA colonization surveillance program. It is not intended to diagnose MRSA infection nor to guide or monitor treatment for MRSA infections.      Labs: BNP (last 3 results) No results for input(s): BNP in the last 8760 hours. Basic Metabolic Panel:  Recent Labs Lab 05/19/17 0938 05/19/17 1623 05/20/17 0310 05/21/17 0625 05/22/17 0523  NA 139 146* 146* 143 140  K 3.4* 3.5 3.4* 3.6 3.2*  CL 103 117* 117* 112* 107  CO2 27 26 26 27 28   GLUCOSE 292* 185* 101* 90 133*  BUN 13 10 10 15 16   CREATININE 1.04* 0.95 1.00 1.14* 1.31*  CALCIUM 8.7* 7.5* 7.7* 8.5* 8.4*  MG 1.9  --   --   --   --   PHOS  --   --   --   --  3.9   Liver Function Tests:  Recent Labs Lab 05/19/17 0938 05/22/17 0523  AST 17  --   ALT 16  --   ALKPHOS 89  --   BILITOT 0.5  --   PROT 7.0  --   ALBUMIN 3.6 3.1*   No results for input(s): LIPASE, AMYLASE in the last 168 hours. No results for input(s): AMMONIA in the last 168 hours. CBC:  Recent Labs Lab 05/19/17 0938  WBC 9.8  NEUTROABS 7.2  HGB 11.7*  HCT 36.3  MCV 87.7  PLT 303   Cardiac Enzymes:  Recent Labs Lab 05/19/17 1623  CKTOTAL 74   BNP: Invalid input(s): POCBNP CBG:  Recent Labs Lab 05/21/17 1707 05/21/17 2148 05/22/17 0748 05/22/17 1154 05/22/17 1728  GLUCAP 175* 199* 97 196* 227*   D-Dimer No results for input(s): DDIMER in the last 72 hours. Hgb A1c No results for input(s): HGBA1C in the last 72 hours. Lipid Profile No results for input(s): CHOL, HDL, LDLCALC, TRIG, CHOLHDL, LDLDIRECT in the last 72 hours. Thyroid function studies No results for input(s): TSH, T4TOTAL, T3FREE, THYROIDAB in the last 72 hours.  Invalid input(s): FREET3 Anemia work up No results for input(s): VITAMINB12, FOLATE, FERRITIN, TIBC, IRON, RETICCTPCT in the last 72 hours. Urinalysis    Component Value Date/Time   COLORURINE YELLOW 05/22/2017  0959   APPEARANCEUR CLEAR 05/22/2017 0959   LABSPEC <1.005 (L) 05/22/2017 0959   PHURINE 6.0 05/22/2017 0959   GLUCOSEU 500 (A) 05/22/2017 0959   HGBUR NEGATIVE 05/22/2017 0959   BILIRUBINUR NEGATIVE 05/22/2017 0959   KETONESUR NEGATIVE 05/22/2017 0959   PROTEINUR NEGATIVE 05/22/2017 0959   UROBILINOGEN 0.2 08/05/2015 1304   NITRITE NEGATIVE 05/22/2017 0959   LEUKOCYTESUR NEGATIVE 05/22/2017 0959   Sepsis Labs Invalid input(s): PROCALCITONIN,  WBC,  LACTICIDVEN Microbiology Recent Results (from the past 240 hour(s))  MRSA PCR Screening     Status: None   Collection Time: 05/19/17  6:53 PM  Result Value Ref Range Status   MRSA by PCR NEGATIVE NEGATIVE Final    Comment:        The GeneXpert MRSA Assay (FDA approved for NASAL specimens only), is one component of a comprehensive MRSA colonization surveillance program. It is not intended to diagnose MRSA infection  nor to guide or monitor treatment for MRSA infections.      Time coordinating discharge: 35 minutes  SIGNED:  Chipper Oman, MD  [facility] 05/22/2017, 7:07 PM  Pager please text page via  [email or web address] Password [email or web address]

## 2017-05-22 NOTE — Progress Notes (Signed)
Pt will be discharged to [facility] via [facility]. Belongings will be sent with patient.

## 2017-05-22 NOTE — Progress Notes (Signed)
Resa is a 61 year old female being admitted voluntarily to 400-1 from [facility].  She came to the ED with attempted overdose of trazodone (1000mg ).  She was medically hospitalized.  She reported her recent stressors are excessive drug use and her daughter (in which she lives with) asked her to leave because of her excessive drug use.  She denied HI or A/V hallucinations.  She reported consistently using crack cocaine.  He has history of multiple suicide attempts in the past.  She is diagnosed with Major Depressive Disorder and Cocaine use.  She was pleasant and cooperative during [facility] admission.  She continues to report suicidal ideation and is able to contract for safety on the unit.  She reported that she was concerned that she got fluids today at the medical floor.  "I have chronic heart failure and I haven't been taking my Lasix."  Encouraged her to talk with the doctor about her concerns.  Oriented her to the unit.  Admission paperwork completed and signed.  Belongings searched and secured in locker # 80.  Skin assessment completed and no skin issues noted.  Q 15 minute checks initiated for safety.  We will monitor the progress towards her goals.

## 2017-05-22 NOTE — Progress Notes (Signed)
Per Charlean Merl, NP., writer may d/c trazodone due to pt recent OD on 1,000 mg of Trazodone. Writer may modify insulin sliding scale to start at supper time today per NP. Writer may order Vistaril 50 mg at bedtime for sleep as needed per NP.

## 2017-05-23 DIAGNOSIS — F17210 Nicotine dependence, cigarettes, uncomplicated: Secondary | ICD-10-CM

## 2017-05-23 DIAGNOSIS — F319 Bipolar disorder, unspecified: Secondary | ICD-10-CM

## 2017-05-23 DIAGNOSIS — T1491XA Suicide attempt, initial encounter: Secondary | ICD-10-CM

## 2017-05-23 DIAGNOSIS — I509 Heart failure, unspecified: Secondary | ICD-10-CM

## 2017-05-23 DIAGNOSIS — M549 Dorsalgia, unspecified: Secondary | ICD-10-CM

## 2017-05-23 DIAGNOSIS — F1420 Cocaine dependence, uncomplicated: Secondary | ICD-10-CM

## 2017-05-23 DIAGNOSIS — T43212A Poisoning by selective serotonin and norepinephrine reuptake inhibitors, intentional self-harm, initial encounter: Secondary | ICD-10-CM

## 2017-05-23 DIAGNOSIS — F332 Major depressive disorder, recurrent severe without psychotic features: Principal | ICD-10-CM

## 2017-05-23 DIAGNOSIS — K5909 Other constipation: Secondary | ICD-10-CM

## 2017-05-23 LAB — GLUCOSE, CAPILLARY
GLUCOSE-CAPILLARY: 103 mg/dL — AB (ref 65–99)
GLUCOSE-CAPILLARY: 209 mg/dL — AB (ref 65–99)
Glucose-Capillary: 127 mg/dL — ABNORMAL HIGH (ref 65–99)

## 2017-05-23 MED ORDER — TRAZODONE HCL 50 MG PO TABS
50.0000 mg | ORAL_TABLET | Freq: Every evening | ORAL | Status: DC | PRN
  Administered 2017-05-23 – 2017-05-25 (×3): 50 mg via ORAL
  Filled 2017-05-23: qty 7
  Filled 2017-05-23 (×3): qty 1

## 2017-05-23 MED ORDER — HYDROCHLOROTHIAZIDE 12.5 MG PO CAPS
12.5000 mg | ORAL_CAPSULE | Freq: Every day | ORAL | Status: DC
  Administered 2017-05-23 – 2017-05-26 (×4): 12.5 mg via ORAL
  Filled 2017-05-23 (×6): qty 1

## 2017-05-23 MED ORDER — GABAPENTIN 100 MG PO CAPS
200.0000 mg | ORAL_CAPSULE | Freq: Three times a day (TID) | ORAL | Status: DC
  Administered 2017-05-23 – 2017-05-26 (×10): 200 mg via ORAL
  Filled 2017-05-23 (×5): qty 2
  Filled 2017-05-23: qty 42
  Filled 2017-05-23: qty 2
  Filled 2017-05-23 (×2): qty 42
  Filled 2017-05-23 (×6): qty 2

## 2017-05-23 MED ORDER — POLYETHYLENE GLYCOL 3350 17 G PO PACK
17.0000 g | PACK | Freq: Every day | ORAL | Status: DC | PRN
  Administered 2017-05-23 – 2017-05-25 (×3): 17 g via ORAL
  Filled 2017-05-23 (×3): qty 1

## 2017-05-23 MED ORDER — DOCUSATE SODIUM 100 MG PO CAPS
100.0000 mg | ORAL_CAPSULE | Freq: Two times a day (BID) | ORAL | Status: DC
  Administered 2017-05-23 – 2017-05-26 (×7): 100 mg via ORAL
  Filled 2017-05-23 (×12): qty 1

## 2017-05-23 MED ORDER — ACETAMINOPHEN 325 MG PO TABS
650.0000 mg | ORAL_TABLET | Freq: Four times a day (QID) | ORAL | Status: DC | PRN
  Administered 2017-05-25 – 2017-05-26 (×2): 650 mg via ORAL
  Filled 2017-05-23 (×2): qty 2

## 2017-05-23 MED ORDER — MAGNESIUM HYDROXIDE 400 MG/5ML PO SUSP: 30.0000 mL | Freq: Every evening | ORAL | Status: DC | PRN

## 2017-05-23 MED ORDER — POTASSIUM CHLORIDE CRYS ER 20 MEQ PO TBCR
20.0000 meq | EXTENDED_RELEASE_TABLET | Freq: Every day | ORAL | Status: DC
  Administered 2017-05-23 – 2017-05-26 (×4): 20 meq via ORAL
  Filled 2017-05-23 (×6): qty 1

## 2017-05-23 NOTE — Progress Notes (Signed)
Patient ID: Lisa Anderson, female   DOB: [birth year], 61 y.o.   MRN: [id number] D:Affect is sad/flat,mood is depressed. Rates her depression as a 7 and anxiety as a 5 today. C/O constipation and appropriate meds were ordered and given with no result at this time. She has attended and participated in groups and activities today with the exception of going to cafeteria for lunch saying she wasn't hungry due to being"stopped up". A:Support and encouragement offered. R:Receptive. Safety maintained.

## 2017-05-23 NOTE — [facility] Suicide Risk Assessment (Addendum)
[facility] Admission Suicide Risk Assessment   Nursing information obtained from:  Patient/ chart  Demographic factors:  61 year old divorced female, lives with daughter and grandchildren, patient is employed , denies legal issues  Current Mental Status:  See below Loss Factors:  Loss of significant relationship, Financial problems / change in socioeconomic status Historical Factors:  History of prior psychiatric admissions , for " cocaine problems and depression". History of cocaine abuse . Two prior suicide attempts by overdosing . Denies alcohol abuse . Risk Reduction Factors:  Sense of responsibility to her family. Total Time spent with patient: 45 minutes Principal Problem: Diagnosis:   Patient Active Problem List   Diagnosis Date Noted  . MDD (major depressive disorder), recurrent severe, without psychosis ([place]) [F33.2] 05/22/2017  . Major depressive disorder, recurrent episode, severe ([place]) [F33.2] 05/21/2017  . Cocaine use disorder, severe, dependence ([place]) [F14.20] 05/21/2017  . Suicidal intent [R45.851] 05/20/2017  . Drug overdose [T50.901A] 05/19/2017  . Family history of colon cancer [Z80.0] 02/23/2017  . Rectal bleeding [K62.5] 02/23/2017  . Gastroesophageal reflux disease without esophagitis [K21.9] 02/23/2017  . Intractable vomiting with nausea [R11.2] 02/23/2017  . Abnormal thyroid blood test [R94.6] 01/02/2017  . Insomnia [G47.00] 01/02/2017  . Anxiety [F41.9] 01/02/2017  . IDDM (insulin dependent diabetes mellitus) ([place]) [E11.9, Z79.4]   . Asthma [J45.909] 11/12/2015  . Acute respiratory failure ([place]) [J96.00] 11/11/2015  . TIA (transient ischemic attack) [G45.9] 11/01/2015  . Chronic diastolic CHF (congestive heart failure) ([place]) [I50.32] 11/01/2015  . Acute focal neurological deficit [R29.818] 11/01/2015  . 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 HLD (hyperlipidemia) [E78.5]   . Palpitations [R00.2]   . Stroke ([place]) [I63.9] 08/05/2015  .  Hypertension [I10] 08/05/2015  . Stroke due to embolism of right posterior cerebral artery ([place]) [I63.431] 08/05/2015  . Diabetes mellitus 08/05/2015  . Alcohol dependency ([place]) [F10.20] 08/10/2013  . Cocaine abuse [F14.10] 08/08/2013  . Alcohol abuse [F10.10] 08/08/2013  . MDD (major depressive disorder) [F32.9] 08/08/2013    Continued Clinical Symptoms:  Alcohol Use Disorder Identification Test Final Score (AUDIT): 4 The "Alcohol Use Disorders Identification Test", Guidelines for Use in Primary Care, Second Edition.  World Pharmacologist [facility]). Score between 0-7:  no or low risk or alcohol related problems. Score between 8-15:  moderate risk of alcohol related problems. Score between 16-19:  high risk of alcohol related problems. Score 20 or above:  warrants further diagnostic evaluation for alcohol dependence and treatment.   CLINICAL FACTORS:  61 year old female. Reports history of Cocaine Dependence. She reports she recently returned home after episode of drug use and saw that her adult daughter, with whom she lives, was very upset about her drug abuse. Patient reports she felt acutely guilty, felt she was a burden to her family, and impulsively overdosed on Trazodone, after which she contacted 911. Following overdose she was admitted to medical unit , was discharged to inpatient psychiatric unit yesterday. She states she is no longer feeling suicidal , and identifies her love for her grandchild as a protective factor against suicide , but states " I am feeling pretty depressed " Denies psychotic symptoms.  Dx- Cocaine Induced Mood Disorder, Depressed, Cocaine Dependence  Plan- Inpatient admission.  Continue Cymbalta 60 mgrs QDAY for pain and depression.  Continue Neurontin 200 mgrs TID for pain, anxiety Recheck BMP in AM      Musculoskeletal: Strength & Muscle Tone: within normal limits Gait & Station: normal Patient leans: N/A  Psychiatric Specialty Exam: Physical  Exam  ROS denies headache, no chest pain, no shortness of breath, no vomiting , reports sciatica symptoms ( lower back pain, radiating down L leg, with " tingling and numbness " at times )   Blood pressure (!) 170/73, pulse 64, temperature 98.2 F (36.8 C), temperature source Oral, resp. rate 16, height 5\' 5"  (1.651 m), weight 73 kg (161 lb).Body mass index is 26.79 kg/m.  General Appearance: Fairly Groomed  Eye Contact:  Fair  Speech:  Normal Rate  Volume:  Decreased  Mood:  depressed, but states she is feeling partially better   Affect:  constricted, but smiles at times appropriately   Thought Process:  Linear and Descriptions of Associations: Intact  Orientation:  Other:  fully alert and attentive  Thought Content:  denies hallucinations, no delusions, not internally preoccupied   Suicidal Thoughts:  No denies suicidal or self injurious ideations at this time and contracts for safety on unit, denies homicidal ideations   Homicidal Thoughts:  No  Memory:  recent and remote grossly intact   Judgement:  Fair  Insight:  Fair  Psychomotor Activity:  Decreased  Concentration:  Concentration: Good and Attention Span: Good  Recall:  Good  Fund of Knowledge:  Good  Language:  Good  Akathisia:  No  Handed:  Right  AIMS (if indicated):     Assets:  Communication Skills Desire for Improvement Resilience  ADL's:  Intact  Cognition:  WNL  Sleep:  Number of Hours: 6.75      COGNITIVE FEATURES THAT CONTRIBUTE TO RISK:  Closed-mindedness and Loss of executive function    SUICIDE RISK:   Moderate:  Frequent suicidal ideation with limited intensity, and duration, some specificity in terms of plans, no associated intent, good self-control, limited dysphoria/symptomatology, some risk factors present, and identifiable protective factors, including available and accessible social support.  PLAN OF CARE: Patient will be admitted to inpatient psychiatric unit for stabilization and safety. Will  provide and encourage milieu participation. Provide medication management and maked adjustments as needed.  Will follow daily.    I certify that inpatient services furnished can reasonably be expected to improve the patient's condition.   Jenne Campus, MD 05/23/2017, 9:13 AM

## 2017-05-23 NOTE — Progress Notes (Signed)
Pt came to the nurse's station early in the shift asking for her sleep meds, stating that she has not been able to sleep in the last few nights.  She also reports that she does not want her Lantus because she is afraid her blood sugar level will drop during the night.  Writer explained that the Lantus is a basal insulin that works over a 24 hr period, but she was adamant that she not get it because she was not going to eat a snack.  Pt denies SI/HI/AVH at this time.  She was given the Vistaril 50 mg for sleep along with the scheduled hydralazine for her BP at 2014.  She remained up in her room until around 2100 then went to bed.  Support and encouragement offered.  Discharge plans are in process.  Safety maintained with q15 minute checks.

## 2017-05-23 NOTE — [facility] Suicide Risk Assessment (Signed)
[place] INPATIENT:  Family/Significant Other Suicide Prevention Education  Suicide Prevention Education:  Patient Refusal for Family/Significant Other Suicide Prevention Education: The patient Lisa Anderson has refused to provide written consent for family/significant other to be provided Family/Significant Other Suicide Prevention Education during admission and/or prior to discharge.  Physician notified.  Georga Kaufmann, MSW, LCSWA 05/23/2017, 4:27 PM

## 2017-05-23 NOTE — H&P (Signed)
Psychiatric Admission Assessment Adult  Patient Identification: Lisa Anderson MRN:  [id number] Date of Evaluation:  05/23/2017 Chief Complaint:  MDD REC SEVE Principal Diagnosis: MDD (major depressive disorder), recurrent severe, without psychosis ([place]) Diagnosis:   Patient Active Problem List   Diagnosis Date Noted  . MDD (major depressive disorder), recurrent severe, without psychosis ([place]) [F33.2] 05/22/2017  . Major depressive disorder, recurrent episode, severe ([place]) [F33.2] 05/21/2017  . Cocaine use disorder, severe, dependence ([place]) [F14.20] 05/21/2017  . Suicidal intent [R45.851] 05/20/2017  . Drug overdose [T50.901A] 05/19/2017  . Family history of colon cancer [Z80.0] 02/23/2017  . Rectal bleeding [K62.5] 02/23/2017  . Gastroesophageal reflux disease without esophagitis [K21.9] 02/23/2017  . Intractable vomiting with nausea [R11.2] 02/23/2017  . Abnormal thyroid blood test [R94.6] 01/02/2017  . Insomnia [G47.00] 01/02/2017  . Anxiety [F41.9] 01/02/2017  . IDDM (insulin dependent diabetes mellitus) ([place]) [E11.9, Z79.4]   . Asthma [J45.909] 11/12/2015  . Acute respiratory failure ([place]) [J96.00] 11/11/2015  . TIA (transient ischemic attack) [G45.9] 11/01/2015  . Chronic diastolic CHF (congestive heart failure) ([place]) [I50.32] 11/01/2015  . Acute focal neurological deficit [R29.818] 11/01/2015  . 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 HLD (hyperlipidemia) [E78.5]   . Palpitations [R00.2]   . Stroke ([place]) [I63.9] 08/05/2015  . Hypertension [I10] 08/05/2015  . Stroke due to embolism of right posterior cerebral artery ([place]) [I63.431] 08/05/2015  . Diabetes mellitus 08/05/2015  . Alcohol dependency ([place]) [F10.20] 08/10/2013  . Cocaine abuse [F14.10] 08/08/2013  . Alcohol abuse [F10.10] 08/08/2013  . MDD (major depressive disorder) [F32.9] 08/08/2013   History of Present Illness: Admission assessment to [facility]: Lisa Anderson is a 61 year old female  being admitted voluntarily to [facility] from [facility].  She came to the ED with attempted overdose of trazodone (1066m).  She was medically hospitalized.  She reported her recent stressors are excessive drug use and her daughter (in which she lives with) asked her to leave because of her excessive drug use.  She denied HI or A/V hallucinations.  She reported consistently using crack cocaine.  He has history of multiple suicide attempts in the past.  She is diagnosed with Major Depressive Disorder and Cocaine use.  [facility]  She continues to report suicidal ideation and is able to contract for safety on the unit.  She reported that she was concerned that she got fluids today at the medical floor.  "I have chronic heart failure and I haven't been taking my Lasix."  Initial [facility]: Patient reports today that she was at the hospital since 05/19/17 for treatment for her overdose. She states that her daughter kicked her out for her continued cocaine use. Patient states that she has been battling cocaine abuse for 10 years. She also has a history of ETOH abuse as well, but none recently. She states that when her daughter kicked her out she felt it was better not to be here anymore and thought about killing herself. She contineus to state that she overdosed on Trazodone. She blames part of her substance abuse on her daughter by saying, "When she has friends over and they start drinking and smoking marijuana then I can't stop myself." [facility] [place] [place] [facility]  She has a complaint of  some back pain and constipation today. Will start medications. Patient normally takes Kdur at home for low potassium and I will restart it here. Will also start HCTZ to assist Hydralazine with HTN.    Associated Signs/Symptoms: Depression Symptoms:  depressed mood, feelings of worthlessness/guilt, hopelessness, suicidal thoughts with specific plan, suicidal attempt, anxiety, disturbed sleep, (Hypo) Manic Symptoms:  Denies currently but reports a hx of symptoms for mania Anxiety Symptoms:  Excessive Worry, Psychotic Symptoms:  Denies PTSD Symptoms: Had a traumatic exposure:  but does not interfere with her life at all. Total Time spent with patient: 30 minutes  Past Psychiatric History: Cocaine abuse, MDD, ETOH abuse, Bipolar I  Is the patient at risk to self? No.  Has the patient been a risk to self in the past 6 months? No.  Has the patient been a risk to self within the distant past? No.  Is the patient a risk to others? No.  Has the patient been a risk to others in the past 6 months? No.  Has the patient been a risk to others within the distant past? No.   Prior Inpatient Therapy:   Prior Outpatient Therapy:    Alcohol Screening: 1. How often do you have a drink containing alcohol?: 2 to 4 times a month 2. How many drinks containing alcohol do you have on a typical day when you are drinking?: 3 or 4 3. How often do you have six or more drinks on one occasion?: Less than monthly Preliminary Score: 2 4. How often during the last year have you found that you were not able to stop drinking once you had started?: Never 5. How often during the last year have you failed to do what was normally expected from you becasue of drinking?: Never 6. How often during the last year have you needed a first drink in the morning to get yourself going after a heavy drinking session?: Never 7. How often during the last year have you had a feeling of guilt of remorse after drinking?: Never 8. How often during the last year have you been unable to remember what happened the night before because you had been drinking?: Never 9. Have you or someone else been injured as a result of your drinking?:  No 10. Has a relative or friend or a doctor or another health worker been concerned about your drinking or suggested you cut down?: No Alcohol Use Disorder Identification Test Final Score (AUDIT): 4 Brief Intervention: AUDIT score less than 7 or less-screening does not suggest unhealthy drinking-brief intervention not indicated Substance Abuse History in the last 12 months:  Yes.   Consequences of Substance Abuse: Medical Consequences:  reviewed Legal Consequences:  reviewed Family Consequences:  reviewed Previous Psychotropic Medications: Yes  Psychological Evaluations: Yes  Past Medical History:  Past Medical History:  Diagnosis Date  . Asthma   . Cancer ([place])    breast  . Chronic congestive heart failure with left ventricular diastolic dysfunction ([place])   . Diabetes mellitus   . Hyperlipidemia   . Hypertension   . Stroke [facility])     Past Surgical History:  Procedure Laterality Date  . BREAST LUMPECTOMY    . EP IMPLANTABLE DEVICE N/A 08/07/2015   Procedure: Loop Recorder Insertion;  Surgeon: Thompson Grayer, MD;  Location: [place] [facility];  Service: Cardiovascular;  Laterality: N/A;  . TEE WITHOUT CARDIOVERSION N/A 08/07/2015   Procedure: TRANSESOPHAGEAL ECHOCARDIOGRAM (TEE);  Surgeon: Sueanne Margarita, MD;  Location: [place];  Service: Cardiovascular;  Laterality: N/A;  . TONSILLECTOMY     Family History:  Family History  Problem Relation Age of Onset  . Diabetes Mother   . Hyperlipidemia Mother   . Hypertension Mother   . Breast cancer Mother   . Rheum arthritis Father   . Asthma Father   . Stroke Father   . Thyroid disease Father   . Thyroid disease Brother   . Colon cancer Brother        in his 29s  . Breast cancer Sister    Family Psychiatric  History: Son - schizophrenia; Brother, sister, and nephew - mental health (doesn't know diagnosis) Tobacco Screening: Have you used any form of tobacco in the last 30 days? (Cigarettes, Smokeless Tobacco, Cigars, and/or  Pipes): Yes Tobacco use, Select all that apply: 5 or more cigarettes per day Are you interested in Tobacco Cessation Medications?: No, patient refused Counseled patient on smoking cessation including recognizing danger situations, developing coping skills and basic information about quitting provided: Refused/Declined practical counseling Social History:  History  Alcohol Use  . 3.6 oz/week  . 6 Cans of beer per week    Comment: occasionally     History  Drug Use  . Types: "Crack" cocaine, Cocaine    Comment: crack cocaine    Additional Social History: Marital status: Single Does patient have children?: Yes How many children?: 5 How is patient's relationship with their children?: 3 daughters and 2 kids; When I use, my kids don't come around me. My daughter that I live with is my main support.        Allergies:   Allergies  Allergen Reactions  . Prednisone Hives and Other (See Comments)    Made her "crazy", suicidal  . Benicar [Olmesartan] Other (See Comments)    Did not work at all for patient  . Celexa [Citalopram] Other (See Comments)    Made patient feel crazy, fluoxetine is fine  . Effexor [Venlafaxine] Other (See Comments)    Made patient feel crazy  . Shrimp [Shellfish Allergy] Hives and Swelling   Lab Results:  Results for orders placed or performed during the hospital encounter of 05/22/17 (from the past 48 hour(s))  Glucose, capillary     Status: Abnormal   Collection Time: 05/22/17  5:28 PM  Result Value Ref Range   Glucose-Capillary 227 (H) 65 - 99 mg/dL  Glucose, capillary     Status: Abnormal   Collection Time: 05/22/17  8:16 PM  Result Value Ref Range   Glucose-Capillary 141 (H) 65 - 99 mg/dL  Glucose, capillary     Status: Abnormal   Collection Time: 05/23/17  5:49 AM  Result Value Ref Range   Glucose-Capillary 127 (H) 65 - 99 mg/dL    Blood Alcohol level:  Lab Results  Component Value Date   ETH <5 05/19/2017   ETH <5 72/53/6644    Metabolic  Disorder Labs:  Lab Results  Component Value Date   HGBA1C 8.6 01/02/2017   MPG 237 11/02/2015   MPG 237 08/06/2015   No results found for: PROLACTIN Lab Results  Component Value Date   CHOL 200 11/02/2015   TRIG 183 (H) 11/02/2015   HDL 36 (L) 11/02/2015   CHOLHDL 5.6 11/02/2015   VLDL 37 11/02/2015   LDLCALC 127 (H) 11/02/2015   LDLCALC 138 (H) 08/06/2015    Current Medications: Current Facility-Administered Medications  Medication Dose Route Frequency Provider Last Rate Last Dose  . acetaminophen (TYLENOL) tablet 650 mg  650 mg Oral Q6H PRN Patriciaann Clan E, PA-C      . albuterol (PROVENTIL HFA;VENTOLIN HFA) 108 (90 Base) MCG/ACT inhaler 2 puff  2 puff Inhalation Q6H PRN Okonkwo, Justina A, NP      . aspirin EC tablet 162 mg  162 mg Oral Daily Okonkwo, Justina A, NP   162 mg at 05/23/17 0840  . clopidogrel (PLAVIX) tablet 75 mg  75 mg Oral Daily Okonkwo, Justina A, NP   75 mg at 05/23/17 0839  . diltiazem (CARDIZEM CD) 24 hr capsule 360 mg  360 mg Oral Daily Okonkwo, Justina A, NP   360 mg at 05/23/17 0840  . docusate sodium (COLACE) capsule 100 mg  100 mg Oral BID Money, Lowry Ram, FNP      . DULoxetine (CYMBALTA) DR capsule 60 mg  60 mg Oral Daily Okonkwo, Justina A, NP   60 mg at 05/23/17 0840  . gabapentin (NEURONTIN) capsule 200 mg  200 mg Oral TID Money, Lowry Ram, FNP      . hydrALAZINE (APRESOLINE) tablet 50 mg  50 mg Oral TID Lu Duffel, Justina A, NP   50 mg at 05/23/17 0840  . hydrOXYzine (ATARAX/VISTARIL) tablet 25 mg  25 mg Oral TID PRN Lu Duffel, Justina A, NP      . hydrOXYzine (ATARAX/VISTARIL) tablet 50 mg  50 mg Oral QHS PRN Okonkwo, Justina A, NP   50 mg at 05/22/17 2014  . insulin aspart (novoLOG) injection 0-15 Units  0-15 Units Subcutaneous TID WC Okonkwo, Justina A, NP   2 Units at 05/23/17 [phone]  . insulin glargine (LANTUS) injection 15 Units  15 Units Subcutaneous QHS Okonkwo, Justina A, NP      . metFORMIN (GLUCOPHAGE) tablet 1,000 mg  1,000 mg Oral BID WC  Okonkwo, Justina A, NP   1,000 mg at 05/23/17 0840  . polyethylene glycol (MIRALAX / GLYCOLAX) packet 17 g  17 g Oral Daily PRN Money, Lowry Ram, FNP      . traZODone (DESYREL) tablet 50 mg  50 mg Oral QHS PRN Money, Lowry Ram, FNP       PTA Medications: Prescriptions Prior to Admission  Medication Sig Dispense Refill Last Dose  . albuterol (PROVENTIL HFA;VENTOLIN HFA) 108 (90 Base) MCG/ACT inhaler Inhale 2 puffs into the lungs every 6 (six) hours as needed for wheezing or shortness of breath. Shortness of breath 1 Inhaler 3 Past Week at Unknown time  . aspirin EC 81 MG tablet Take 162 mg by mouth daily.    Past Week at Unknown time  . beclomethasone (QVAR) 80 MCG/ACT inhaler Inhale 1 puff into the lungs 2 (two) times daily. Rinse mouth with water after use to reduce aftertaste and incidence of candidiasis. Do not swallow.   Past Week at Unknown time  . clopidogrel (PLAVIX) 75 MG tablet Take 1 tablet (75 mg total) by mouth daily. 90 tablet 3 Past Week at Unknown time  . diltiazem (CARDIZEM CD) 360 MG 24 hr capsule Take 1 capsule (360 mg total) by mouth daily. 90 capsule 3 Past Week at Unknown time  . DULoxetine (CYMBALTA) 60 MG capsule Take 1 capsule (60 mg total) by mouth daily. 60 capsule 3 Past Week at Unknown time  . gabapentin (NEURONTIN) 100 MG capsule Take 2 capsules (200 mg total) by mouth 2 (two) times daily.     Marland Kitchen glipiZIDE (GLUCOTROL) 5 MG tablet Take 1 tablet (5 mg total) by mouth 2 (two) times daily before a meal. 60 tablet 3 Past  Week at Unknown time  . hydrALAZINE (APRESOLINE) 50 MG tablet Take 1 tablet (50 mg total) by mouth 3 (three) times daily. 270 tablet 3 Past Week at Unknown time  . insulin aspart (NOVOLOG) 100 UNIT/ML injection Sliding scale CBG 70 - 120: 0 units CBG 121 - 150: 1 unit,  CBG 151 - 200: 2 units,  CBG 201 - 250: 3 units,  CBG 251 - 300: 5 units,  CBG 301 - 350: 7 units,  CBG 351 - 400: 9 units   CBG > 400: 9 units and notify your MD 1 vial 12 Past Week at Unknown  time  . insulin glargine (LANTUS) 100 UNIT/ML injection Inject 0.15 mLs (15 Units total) into the skin at bedtime. 10 mL 3 Past Month at Unknown time  . metFORMIN (GLUCOPHAGE) 1000 MG tablet Take 1 tablet (1,000 mg total) by mouth 2 (two) times daily with a meal. 60 tablet 3 Past Week at Unknown time  . potassium chloride SA (K-DUR,KLOR-CON) 20 MEQ tablet Take 1 tablet (20 mEq total) by mouth daily. 90 tablet 3 Past Month at Unknown time  . rosuvastatin (CRESTOR) 20 MG tablet Take 2 tablets (40 mg total) by mouth daily. 180 tablet 3 Past Week at Unknown time    Musculoskeletal: Strength & Muscle Tone: within normal limits Gait & Station: normal Patient leans: N/A  Psychiatric Specialty Exam: Physical Exam  Nursing note and vitals reviewed. Constitutional: She is oriented to person, place, and time. She appears well-developed and well-nourished.  Cardiovascular: Normal rate.   Respiratory: Effort normal.  Musculoskeletal: Normal range of motion.  Neurological: She is alert and oriented to person, place, and time.  Skin: Skin is warm.    Review of Systems  Constitutional: Negative.   HENT: Negative.   Eyes: Negative.   Respiratory: Negative.   Cardiovascular: Negative.   Gastrointestinal: Positive for constipation.  Genitourinary: Negative.   Musculoskeletal: Positive for back pain.  Skin: Negative.   Neurological: Negative.   Endo/Heme/Allergies: Negative.     Blood pressure (!) 170/73, pulse 64, temperature 98.2 F (36.8 C), temperature source Oral, resp. rate 16, height '5\' 5"'$  (1.651 m), weight 73 kg (161 lb).Body mass index is 26.79 kg/m.  General Appearance: Casual  Eye Contact:  Good  Speech:  Clear and Coherent and Normal Rate  Volume:  Normal  Mood:  Depressed  Affect:  Flat  Thought Process:  Coherent and Descriptions of Associations: Intact  Orientation:  Full (Time, Place, and Person)  Thought Content:  WDL  Suicidal Thoughts:  No  Homicidal Thoughts:  No   Memory:  Immediate;   Good Recent;   Good  Judgement:  Fair  Insight:  Fair  Psychomotor Activity:  Normal  Concentration:  Concentration: Fair  Recall:  Good  Fund of Knowledge:  Fair  Language:  Good  Akathisia:  Negative  Handed:  Right  AIMS (if indicated):     Assets:  Financial Resources/Insurance  ADL's:  Intact  Cognition:  WNL  Sleep:  Number of Hours: 6.75    Treatment Plan Summary: Daily contact with patient to assess and evaluate symptoms and progress in treatment, Medication management and Plan See physician SRA and MAR for medication changes  Observation Level/Precautions:  15 minute checks  Laboratory:  Reviewed  Psychotherapy:  Group Therapy  Medications:  See [facility]  Consultations:  As needed  Discharge Concerns:  Relapse  Estimated LOS: 3-5 Days  Other:  Admit to [facility]  for Primary Diagnosis: MDD (major depressive disorder), recurrent severe, without psychosis ([place]) Long Term Goal(s): Improvement in symptoms so as ready for discharge  Short Term Goals: Ability to verbalize feelings will improve and Ability to disclose and discuss suicidal ideas  Physician Treatment Plan for Secondary Diagnosis: Principal Problem:   MDD (major depressive disorder), recurrent severe, without psychosis ([place]) Active Problems:   Cocaine abuse  Long Term Goal(s): Improvement in symptoms so as ready for discharge  Short Term Goals: Ability to identify and develop effective coping behaviors will improve and Compliance with prescribed medications will improve  I certify that inpatient services furnished can reasonably be expected to improve the patient's condition.    Lewis Shock, FNP 8/28/201810:56 AM  I have reviewed case with NP and have met with patient Agree with NP Assessment  61 year old female. Reports history of Cocaine Dependence. She reports she recently returned home after episode of drug use and saw that her adult daughter, with  whom she lives, was very upset about her drug abuse. Patient reports she felt acutely guilty, felt she was a burden to her family, and impulsively overdosed on Trazodone, after which she contacted 911. Following overdose she was admitted to medical unit , was discharged to inpatient psychiatric unit yesterday. She states she is no longer feeling suicidal , and identifies her love for her grandchild as a protective factor against suicide , but states " I am feeling pretty depressed " Denies psychotic symptoms.  Dx- Cocaine Induced Mood Disorder, Depressed, Cocaine Dependence  Plan- Inpatient admission.  Continue Cymbalta 60 mgrs QDAY for pain and depression.  Continue Neurontin 200 mgrs TID for pain, anxiety Recheck BMP in AM

## 2017-05-23 NOTE — B[facility] Group Notes (Signed)
LCSW Group Therapy Note  05/23/2017 1:15pm  Type of Therapy/Topic:  Group Therapy:  Feelings about Diagnosis  Participation Level: Active  Description of Group:   This group will allow patients to explore their thoughts and feelings about diagnoses they have received. Patients will be guided to explore their level of understanding and acceptance of these diagnoses. Facilitator will encourage patients to process their thoughts and feelings about the reactions of others to their diagnosis and will guide patients in identifying ways to discuss their diagnosis with significant others in their lives. This group will be process-oriented, with patients participating in exploration of their own experiences, giving and receiving support, and processing challenge from other group members.   Therapeutic Goals: 1. Patient will demonstrate understanding of diagnosis as evidenced by identifying two or more symptoms of the disorder 2. Patient will be able to express two feelings regarding the diagnosis 3. Patient will demonstrate their ability to communicate their needs through discussion and/or role play  Summary of Patient Progress: Pt states that she has a hard time enjoying the activities that she used to enjoy because of her depression. Pt also states that spending time with her family is difficult because they don't understand the way that she is feeling. Pt states that she isolates when she is not feeling well.    Therapeutic Modalities:   Cognitive Behavioral Therapy Brief Therapy Feelings Identification    Georga Kaufmann, MSW, [place] 05/23/2017 3:11 PM
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
## 2017-05-24 LAB — BASIC METABOLIC PANEL
ANION GAP: 10 (ref 5–15)
BUN: 19 mg/dL (ref 6–20)
CALCIUM: 9.6 mg/dL (ref 8.9–10.3)
CO2: 27 mmol/L (ref 22–32)
Chloride: 103 mmol/L (ref 101–111)
Creatinine, Ser: 1.08 mg/dL — ABNORMAL HIGH (ref 0.44–1.00)
GFR, EST NON AFRICAN AMERICAN: 55 mL/min — AB (ref >60)
GLUCOSE: 127 mg/dL — AB (ref 65–99)
POTASSIUM: 3.5 mmol/L (ref 3.5–5.1)
Sodium: 140 mmol/L (ref 135–145)

## 2017-05-24 LAB — GLUCOSE, CAPILLARY
GLUCOSE-CAPILLARY: 161 mg/dL — AB (ref 65–99)
Glucose-Capillary: 134 mg/dL — ABNORMAL HIGH (ref 65–99)
Glucose-Capillary: 114 mg/dL — ABNORMAL HIGH (ref 65–99)
Glucose-Capillary: 134 mg/dL — ABNORMAL HIGH (ref 65–99)

## 2017-05-24 MED ORDER — NICOTINE POLACRILEX 2 MG MT GUM
2.0000 mg | CHEWING_GUM | OROMUCOSAL | Status: DC | PRN
  Administered 2017-05-24 – 2017-05-26 (×3): 2 mg via ORAL
  Filled 2017-05-24: qty 1
  Filled 2017-05-24: qty 10
  Filled 2017-05-24: qty 1

## 2017-05-24 NOTE — Progress Notes (Addendum)
[facility] MD Progress Note  05/24/2017 10:18 AM Lisa Anderson  MRN:  [id number] Subjective: patient reports she is feeling better. States that although she is upset her daughter was angry with her , she can see that the outcome is positive . " If she hadn't, everything would have stayed the same, and I needed a wake up call". Reports she was using cocaine intermittently, depending on money availability, and states she feels she was in denial about it until recent events . At this time is future oriented, and states her plan is to go to an [facility] at discharge. Denies cravings for cocaine at this time. Denies medication side effects. Denies any lingering physical symptoms associated with recent Trazodone overdose.  Objective : I have discussed case with treatment team and have met with patient . Patient presents with improving mood and range of affect, less constricted, smiles at times appropriately. Denies suicidal ideations at this time. As above, she reports increasing insight regarding her substance use disorder and expresses motivation in sobriety . Thus far tolerating medications well , currently  denies side effects- RN staff reports patient had complained of some drowsiness earlier. . Behavior on unit in good control, no disruptive or agitated behaviors on unit, going to groups. Labs reviewed- renal function tests improving - creatinine down to 1.08, GFR improved to 55   Principal Problem: MDD (major depressive disorder), recurrent severe, without psychosis ([place]) Diagnosis:   Patient Active Problem List   Diagnosis Date Noted  . Bipolar I disorder ([place]) [F31.9] 05/23/2017  . MDD (major depressive disorder), recurrent severe, without psychosis ([place]) [F33.2] 05/22/2017  . Major depressive disorder, recurrent episode, severe ([place]) [F33.2] 05/21/2017  . Cocaine use disorder, severe, dependence ([place]) [F14.20] 05/21/2017  . Suicidal intent [R45.851] 05/20/2017  . Drug overdose [T50.901A]  05/19/2017  . Family history of colon cancer [Z80.0] 02/23/2017  . Rectal bleeding [K62.5] 02/23/2017  . Gastroesophageal reflux disease without esophagitis [K21.9] 02/23/2017  . Intractable vomiting with nausea [R11.2] 02/23/2017  . Abnormal thyroid blood test [R94.6] 01/02/2017  . Insomnia [G47.00] 01/02/2017  . Anxiety [F41.9] 01/02/2017  . IDDM (insulin dependent diabetes mellitus) ([place]) [E11.9, Z79.4]   . Asthma [J45.909] 11/12/2015  . Acute respiratory failure ([place]) [J96.00] 11/11/2015  . TIA (transient ischemic attack) [G45.9] 11/01/2015  . Chronic diastolic CHF (congestive heart failure) ([place]) [I50.32] 11/01/2015  . Acute focal neurological deficit [R29.818] 11/01/2015  . 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 HLD (hyperlipidemia) [E78.5]   . Palpitations [R00.2]   . Stroke ([place]) [I63.9] 08/05/2015  . Hypertension [I10] 08/05/2015  . Stroke due to embolism of right posterior cerebral artery ([place]) [I63.431] 08/05/2015  . Diabetes mellitus 08/05/2015  . Alcohol dependency ([place]) [F10.20] 08/10/2013  . Cocaine abuse [F14.10] 08/08/2013  . Alcohol abuse [F10.10] 08/08/2013  . MDD (major depressive disorder) [F32.9] 08/08/2013   Total Time spent with patient: 20 minutes   Past Medical History:  Past Medical History:  Diagnosis Date  . Asthma   . Cancer ([place])    breast  . Chronic congestive heart failure with left ventricular diastolic dysfunction ([place])   . Diabetes mellitus   . Hyperlipidemia   . Hypertension   . Stroke [facility])     Past Surgical History:  Procedure Laterality Date  . BREAST LUMPECTOMY    . EP IMPLANTABLE DEVICE N/A 08/07/2015   Procedure: Loop Recorder Insertion;  Surgeon: Thompson Grayer, MD;  Location: [facility];  Service: Cardiovascular;  Laterality: N/A;  . TEE WITHOUT CARDIOVERSION N/A 08/07/2015   Procedure: TRANSESOPHAGEAL ECHOCARDIOGRAM (TEE);  Surgeon: Sueanne Margarita, MD;  Location[facility];  Service:  Cardiovascular;  Laterality: N/A;  . TONSILLECTOMY     Family History:  Family History  Problem Relation Age of Onset  . Diabetes Mother   . Hyperlipidemia Mother   . Hypertension Mother   . Breast cancer Mother   . Rheum arthritis Father   . Asthma Father   . Stroke Father   . Thyroid disease Father   . Thyroid disease Brother   . Colon cancer Brother        in his 73s  . Breast cancer Sister    Social History:  History  Alcohol Use  . 3.6 oz/week  . 6 Cans of beer per week    Comment: occasionally     History  Drug Use  . Types: "Crack" cocaine, Cocaine    Comment: crack cocaine    Social History   Social History  . Marital status: Single    Spouse name: N/A  . Number of children: 5  . Years of education: 12   Occupational History  . CNA- [employer]    Social History Main Topics  . Smoking status: Current Every Day Smoker    Packs/day: 0.50  . Smokeless tobacco: Never Used  . Alcohol use 3.6 oz/week    6 Cans of beer per week     Comment: occasionally  . Drug use: Yes    Types: "Crack" cocaine, Cocaine     Comment: crack cocaine  . Sexual activity: No     Comment: recovering cocaine addict x6 months   Other Topics Concern  . None   Social History Narrative   Lives with daughter      Caffeine use: Drinks soda (2- 20 oz soda per day)   Additional Social History:   Sleep: Good  Appetite:  Fair  Current Medications: Current Facility-Administered Medications  Medication Dose Route Frequency Provider Last Rate Last Dose  . acetaminophen (TYLENOL) tablet 650 mg  650 mg Oral Q6H PRN Patriciaann Clan E, PA-C      . albuterol (PROVENTIL HFA;VENTOLIN HFA) 108 (90 Base) MCG/ACT inhaler 2 puff  2 puff Inhalation Q6H PRN Okonkwo, Justina A, NP      . aspirin EC tablet 162 mg  162 mg Oral Daily Okonkwo, Justina A, NP   162 mg at 05/24/17 0811  . clopidogrel (PLAVIX) tablet 75 mg  75 mg Oral Daily Okonkwo, Justina A, NP   75 mg at 05/24/17 0812   . diltiazem (CARDIZEM CD) 24 hr capsule 360 mg  360 mg Oral Daily Okonkwo, Justina A, NP   360 mg at 05/24/17 0811  . docusate sodium (COLACE) capsule 100 mg  100 mg Oral BID Money, Darnelle Maffucci B, FNP   100 mg at 05/24/17 0813  . DULoxetine (CYMBALTA) DR capsule 60 mg  60 mg Oral Daily Okonkwo, Justina A, NP   60 mg at 05/24/17 0812  . gabapentin (NEURONTIN) capsule 200 mg  200 mg Oral TID Money, Lowry Ram, FNP   200 mg at 05/24/17 0813  . hydrALAZINE (APRESOLINE) tablet 50 mg  50 mg Oral TID Hughie Closs A, NP   50 mg at 05/24/17 0814  . hydrochlorothiazide (MICROZIDE) capsule 12.5 mg  12.5 mg Oral Daily Money, Lowry Ram, FNP   12.5 mg at 05/24/17 4536  . hydrOXYzine (ATARAX/VISTARIL) tablet 25 mg  25 mg Oral  TID PRN Hughie Closs A, NP      . hydrOXYzine (ATARAX/VISTARIL) tablet 50 mg  50 mg Oral QHS PRN Okonkwo, Justina A, NP   50 mg at 05/22/17 2014  . insulin aspart (novoLOG) injection 0-15 Units  0-15 Units Subcutaneous TID WC Okonkwo, Justina A, NP   2 Units at 05/24/17 0620  . insulin glargine (LANTUS) injection 15 Units  15 Units Subcutaneous QHS Okonkwo, Justina A, NP   15 Units at 05/23/17 2107  . metFORMIN (GLUCOPHAGE) tablet 1,000 mg  1,000 mg Oral BID WC Okonkwo, Justina A, NP   1,000 mg at 05/24/17 0813  . polyethylene glycol (MIRALAX / GLYCOLAX) packet 17 g  17 g Oral Daily PRN Money, Lowry Ram, FNP   17 g at 05/24/17 1660  . potassium chloride SA (K-DUR,KLOR-CON) CR tablet 20 mEq  20 mEq Oral Daily Money, Lowry Ram, FNP   20 mEq at 05/24/17 0814  . traZODone (DESYREL) tablet 50 mg  50 mg Oral QHS PRN Money, Lowry Ram, FNP   50 mg at 05/23/17 2111    Lab Results:  Results for orders placed or performed during the hospital encounter of 05/22/17 (from the past 48 hour(s))  Glucose, capillary     Status: Abnormal   Collection Time: 05/22/17  5:28 PM  Result Value Ref Range   Glucose-Capillary 227 (H) 65 - 99 mg/dL  Glucose, capillary     Status: Abnormal   Collection Time: 05/22/17   8:16 PM  Result Value Ref Range   Glucose-Capillary 141 (H) 65 - 99 mg/dL  Glucose, capillary     Status: Abnormal   Collection Time: 05/23/17  5:49 AM  Result Value Ref Range   Glucose-Capillary 127 (H) 65 - 99 mg/dL  Glucose, capillary     Status: Abnormal   Collection Time: 05/23/17  5:22 PM  Result Value Ref Range   Glucose-Capillary 103 (H) 65 - 99 mg/dL  Glucose, capillary     Status: Abnormal   Collection Time: 05/23/17  8:51 PM  Result Value Ref Range   Glucose-Capillary 209 (H) 65 - 99 mg/dL  Basic metabolic panel     Status: Abnormal   Collection Time: 05/24/17  6:07 AM  Result Value Ref Range   Sodium 140 135 - 145 mmol/L   Potassium 3.5 3.5 - 5.1 mmol/L   Chloride 103 101 - 111 mmol/L   CO2 27 22 - 32 mmol/L   Glucose, Bld 127 (H) 65 - 99 mg/dL   BUN 19 6 - 20 mg/dL   Creatinine, Ser 1.08 (H) 0.44 - 1.00 mg/dL   Calcium 9.6 8.9 - 10.3 mg/dL   GFR calc non Af Amer 55 (L) >60 mL/min   GFR calc Af Amer >60 >60 mL/min    Comment: (NOTE) The eGFR has been calculated using the CKD EPI equation. This calculation has not been validated in all clinical situations. eGFR's persistently <60 mL/min signify possible Chronic Kidney Disease.    Anion gap 10 5 - 15    Comment: Performed at [facility], [place] [address] [place], [place] [address]  Glucose, capillary     Status: Abnormal   Collection Time: 05/24/17  6:07 AM  Result Value Ref Range   Glucose-Capillary 134 (H) 65 - 99 mg/dL    Blood Alcohol level:  Lab Results  Component Value Date   [facility] <5 05/19/2017   ETH <5 10/04/3233    Metabolic Disorder Labs: Lab Results  Component Value Date  HGBA1C 8.6 01/02/2017   MPG 237 11/02/2015   MPG 237 08/06/2015   No results found for: PROLACTIN Lab Results  Component Value Date   CHOL 200 11/02/2015   TRIG 183 (H) 11/02/2015   HDL 36 (L) 11/02/2015   CHOLHDL 5.6 11/02/2015   VLDL 37 11/02/2015   LDLCALC 127 (H) 11/02/2015   LDLCALC 138 (H)  08/06/2015    Physical Findings: AIMS: Facial and Oral Movements Muscles of Facial Expression: None, normal Lips and Perioral Area: None, normal Jaw: None, normal Tongue: None, normal,Extremity Movements Upper (arms, wrists, hands, fingers): None, normal Lower (legs, knees, ankles, toes): None, normal, Trunk Movements Neck, shoulders, hips: None, normal, Overall Severity Severity of abnormal movements (highest score from questions above): None, normal Incapacitation due to abnormal movements: None, normal Patient's awareness of abnormal movements (rate only patient's report): No Awareness, Dental Status Current problems with teeth and/or dentures?: No Does patient usually wear dentures?: No  CIWA:    COWS:     Musculoskeletal: Strength & Muscle Tone: within normal limits Gait & Station: normal Patient leans: N/A  Psychiatric Specialty Exam: Physical Exam  ROS denies chest pain, no shortness of breath, no vomiting. Constipation reported as improved at this time - had BM earlier today Denies any bleeding - is on Plavix/ASA  Blood pressure 139/80, pulse 77, temperature 98.5 F (36.9 C), temperature source Oral, resp. rate 18, height '5\' 5"'  (1.651 m), weight 73 kg (161 lb).Body mass index is 26.79 kg/m.  General Appearance: Fairly Groomed- improving grooming   Eye Contact:  improving   Speech:  Normal Rate  Volume:  Normal  Mood:  improving mood, states she feels better  Affect:  less constricted, smiles at times appropriately   Thought Process:  Linear and Descriptions of Associations: Intact  Orientation:  Full (Time, Place, and Person)  Thought Content:  denies hallucinations, no delusions, not internally preoccupied   Suicidal Thoughts:  No- denies suicidal plan or intention and is able to contract for safety on unit at this time, denies homicidal or violent ideations  Homicidal Thoughts:  No  Memory:  recent and remote grossly intact   Judgement:  Other:  improved    Insight:  improving   Psychomotor Activity:  Normal  Concentration:  Concentration: Good and Attention Span: Good  Recall:  Good  Fund of Knowledge:  Good  Language:  Good  Akathisia:  Negative  Handed:  Right  AIMS (if indicated):     Assets:  Communication Skills Desire for Improvement Resilience  ADL's:  Intact  Cognition:  WNL  Sleep:  Number of Hours: 6.75   Assessment - patient presents with improving mood, less constricted affect, and denies current suicidal ideations. She reports she feels she is more insightful about cocaine use disorder and motivated in addressing it. She wants to go to an [facility] setting after discharge. Thus far tolerating medications well .  Treatment Plan Summary: Daily contact with patient to assess and evaluate symptoms and progress in treatment, Medication management, Plan inpatient treatment  and medications as below Encourage group and milieu participation to work on coping skills and symptom reduction Encourage efforts to work on recovery and relapse prevention Continue Cymbalta 60 mgrs QDAY for depression, anxiety Continue Neurontin 200 mgrs TID for anxiety, pain Continue Trazodone 50 mgrs QHS PRN for insomnia as needed  Continue Vistaril 25 mgrs Q 8 hours PRN for anxiety as needed  Continue Plavix and ASA for history of CVA- patient aware of potential  increased risk of bleeding, particularly GI bleed, denies any current symptoms Jenne Campus, MD 05/24/2017, 10:18 AM

## 2017-05-24 NOTE — Tx Team (Signed)
Interdisciplinary Treatment and Diagnostic Plan Update 05/24/2017 Time of Session: 9:30am  Lisa Anderson  MRN: [id number]  Principal Diagnosis: MDD (major depressive disorder), recurrent severe, without psychosis ([place])  Secondary Diagnoses: Principal Problem:   MDD (major depressive disorder), recurrent severe, without psychosis (Lisa Anderson) Active Problems:   Cocaine abuse   Bipolar I disorder (Lisa Anderson)   Current Medications:  Current Facility-Administered Medications  Medication Dose Route Frequency Provider Last Rate Last Dose  . acetaminophen (TYLENOL) tablet 650 mg  650 mg Oral Q6H PRN Patriciaann Clan E, PA-C      . albuterol (PROVENTIL HFA;VENTOLIN HFA) 108 (90 Base) MCG/ACT inhaler 2 puff  2 puff Inhalation Q6H PRN Okonkwo, Justina A, NP      . aspirin EC tablet 162 mg  162 mg Oral Daily Okonkwo, Justina A, NP   162 mg at 05/24/17 0811  . clopidogrel (PLAVIX) tablet 75 mg  75 mg Oral Daily Okonkwo, Justina A, NP   75 mg at 05/24/17 0812  . diltiazem (CARDIZEM CD) 24 hr capsule 360 mg  360 mg Oral Daily Okonkwo, Justina A, NP   360 mg at 05/24/17 0811  . docusate sodium (COLACE) capsule 100 mg  100 mg Oral BID Money, Darnelle Maffucci B, FNP   100 mg at 05/24/17 0813  . DULoxetine (CYMBALTA) DR capsule 60 mg  60 mg Oral Daily Okonkwo, Justina A, NP   60 mg at 05/24/17 0812  . gabapentin (NEURONTIN) capsule 200 mg  200 mg Oral TID Money, Lowry Ram, FNP   200 mg at 05/24/17 1303  . hydrALAZINE (APRESOLINE) tablet 50 mg  50 mg Oral TID Lu Duffel, Justina A, NP   50 mg at 05/24/17 1302  . hydrochlorothiazide (MICROZIDE) capsule 12.5 mg  12.5 mg Oral Daily Money, Lowry Ram, FNP   12.5 mg at 05/24/17 1027  . hydrOXYzine (ATARAX/VISTARIL) tablet 25 mg  25 mg Oral TID PRN Lu Duffel, Justina A, NP      . hydrOXYzine (ATARAX/VISTARIL) tablet 50 mg  50 mg Oral QHS PRN Okonkwo, Justina A, NP   50 mg at 05/22/17 2014  . insulin aspart (novoLOG) injection 0-15 Units  0-15 Units Subcutaneous TID WC Okonkwo, Justina A, NP    2 Units at 05/24/17 0620  . insulin glargine (LANTUS) injection 15 Units  15 Units Subcutaneous QHS Okonkwo, Justina A, NP   15 Units at 05/23/17 2107  . metFORMIN (GLUCOPHAGE) tablet 1,000 mg  1,000 mg Oral BID WC Okonkwo, Justina A, NP   1,000 mg at 05/24/17 0813  . polyethylene glycol (MIRALAX / GLYCOLAX) packet 17 g  17 g Oral Daily PRN Money, Lowry Ram, FNP   17 g at 05/24/17 2536  . potassium chloride SA (K-DUR,KLOR-CON) CR tablet 20 mEq  20 mEq Oral Daily Money, Lowry Ram, FNP   20 mEq at 05/24/17 0814  . traZODone (DESYREL) tablet 50 mg  50 mg Oral QHS PRN Money, Lowry Ram, FNP   50 mg at 05/23/17 2111    PTA Medications: Prescriptions Prior to Admission  Medication Sig Dispense Refill Last Dose  . albuterol (PROVENTIL HFA;VENTOLIN HFA) 108 (90 Base) MCG/ACT inhaler Inhale 2 puffs into the lungs every 6 (six) hours as needed for wheezing or shortness of breath. Shortness of breath 1 Inhaler 3 Past Week at Unknown time  . aspirin EC 81 MG tablet Take 162 mg by mouth daily.    Past Week at Unknown time  . beclomethasone (QVAR) 80 MCG/ACT inhaler Inhale 1 puff into the lungs 2 (two)  times daily. Rinse mouth with water after use to reduce aftertaste and incidence of candidiasis. Do not swallow.   Past Week at Unknown time  . clopidogrel (PLAVIX) 75 MG tablet Take 1 tablet (75 mg total) by mouth daily. 90 tablet 3 Past Week at Unknown time  . diltiazem (CARDIZEM CD) 360 MG 24 hr capsule Take 1 capsule (360 mg total) by mouth daily. 90 capsule 3 Past Week at Unknown time  . DULoxetine (CYMBALTA) 60 MG capsule Take 1 capsule (60 mg total) by mouth daily. 60 capsule 3 Past Week at Unknown time  . gabapentin (NEURONTIN) 100 MG capsule Take 2 capsules (200 mg total) by mouth 2 (two) times daily.     Marland Kitchen glipiZIDE (GLUCOTROL) 5 MG tablet Take 1 tablet (5 mg total) by mouth 2 (two) times daily before a meal. 60 tablet 3 Past Week at Unknown time  . hydrALAZINE (APRESOLINE) 50 MG tablet Take 1 tablet (50  mg total) by mouth 3 (three) times daily. 270 tablet 3 Past Week at Unknown time  . insulin aspart (NOVOLOG) 100 UNIT/ML injection Sliding scale CBG 70 - 120: 0 units CBG 121 - 150: 1 unit,  CBG 151 - 200: 2 units,  CBG 201 - 250: 3 units,  CBG 251 - 300: 5 units,  CBG 301 - 350: 7 units,  CBG 351 - 400: 9 units   CBG > 400: 9 units and notify your MD 1 vial 12 Past Week at Unknown time  . insulin glargine (LANTUS) 100 UNIT/ML injection Inject 0.15 mLs (15 Units total) into the skin at bedtime. 10 mL 3 Past Month at Unknown time  . metFORMIN (GLUCOPHAGE) 1000 MG tablet Take 1 tablet (1,000 mg total) by mouth 2 (two) times daily with a meal. 60 tablet 3 Past Week at Unknown time  . potassium chloride SA (K-DUR,KLOR-CON) 20 MEQ tablet Take 1 tablet (20 mEq total) by mouth daily. 90 tablet 3 Past Month at Unknown time  . rosuvastatin (CRESTOR) 20 MG tablet Take 2 tablets (40 mg total) by mouth daily. 180 tablet 3 Past Week at Unknown time    Treatment Modalities: Medication Management, Group therapy, Case management,  1 to 1 session with clinician, Psychoeducation, Recreational therapy.  Patient Stressors: Financial difficulties Health problems Marital or family conflict Substance abuse Patient Strengths: Capable of independent living Curator fund of knowledge Work Artist for Primary Diagnosis: MDD (major depressive disorder), recurrent severe, without psychosis ([place]) Long Term Goal(s): Improvement in symptoms so as ready for discharge Short Term Goals: Ability to verbalize feelings will improve Ability to disclose and discuss suicidal ideas Ability to identify and develop effective coping behaviors will improve Compliance with prescribed medications will improve  Medication Management: Evaluate patient's response, side effects, and tolerance of medication regimen.  Therapeutic Interventions: 1 to 1 sessions, Unit Group sessions and Medication  administration.  Evaluation of Outcomes: Progressing  Physician Treatment Plan for Secondary Diagnosis: Principal Problem:   MDD (major depressive disorder), recurrent severe, without psychosis ([place]) Active Problems:   Cocaine abuse   Bipolar I disorder ([place])  Long Term Goal(s): Improvement in symptoms so as ready for discharge  Short Term Goals: Ability to verbalize feelings will improve Ability to disclose and discuss suicidal ideas Ability to identify and develop effective coping behaviors will improve Compliance with prescribed medications will improve  Medication Management: Evaluate patient's response, side effects, and tolerance of medication regimen.  Therapeutic Interventions: 1 to 1 sessions, Unit  Group sessions and Medication administration.  Evaluation of Outcomes: Progressing  RN Treatment Plan for Primary Diagnosis: MDD (major depressive disorder), recurrent severe, without psychosis ([place]) Long Term Goal(s): Knowledge of disease and therapeutic regimen to maintain health will improve  Short Term Goals: Ability to disclose and discuss suicidal ideas and Compliance with prescribed medications will improve  Medication Management: RN will administer medications as ordered by provider, will assess and evaluate patient's response and provide education to patient for prescribed medication. RN will report any adverse and/or side effects to prescribing provider.  Therapeutic Interventions: 1 on 1 counseling sessions, Psychoeducation, Medication administration, Evaluate responses to treatment, Monitor vital signs and CBGs as ordered, Perform/monitor CIWA, COWS, AIMS and Fall Risk screenings as ordered, Perform wound care treatments as ordered.  Evaluation of Outcomes: Progressing  LCSW Treatment Plan for Primary Diagnosis: MDD (major depressive disorder), recurrent severe, without psychosis ([place]) Long Term Goal(s): Safe transition to appropriate next level of care at discharge,  Engage patient in therapeutic group addressing interpersonal concerns. Short Term Goals: Engage patient in aftercare planning with referrals and resources, Increase emotional regulation, Identify triggers associated with mental health/substance abuse issues and Increase skills for wellness and recovery  Therapeutic Interventions: Assess for all discharge needs, 1 to 1 time with Social worker, Explore available resources and support systems, Assess for adequacy in community support network, Educate family and significant other(s) on suicide prevention, Complete Psychosocial Assessment, Interpersonal group therapy.  Evaluation of Outcomes: Progressing  Progress in Treatment: Attending groups: Yes Participating in groups: Yes Taking medication as prescribed: Yes, MD continues to assess for medication changes as needed Toleration medication: Yes, no side effects reported at this time Family/Significant other contact made: No, pt declined contact Patient understands diagnosis: Continuing to assess Discussing patient identified problems/goals with staff: Yes Medical problems stabilized or resolved: Yes Denies suicidal/homicidal ideation: Yes Issues/concerns per patient self-inventory: None Other: N/A  New problem(s) identified: None identified at this time.   New Short Term/Long Term Goal(s): "I want to go back to work"   Discharge Plan or Barriers: Pt will return home and follow up with an outpatient provider.  Reason for Continuation of Hospitalization:  Anxiety  Depression Medication stabilization Suicidal ideation Withdrawal symptoms  Estimated Length of Stay: 1-2 days; Estimated discharge date 05/26/17  Attendees: Patient: Lisa Anderson 05/24/2017 2:43 PM  Physician: Dr. Parke Poisson 05/24/2017 2:43 PM  Nursing: Opal Sidles, RN 05/24/2017 2:43 PM  RN Care Manager: Lars Pinks, RN 05/24/2017 2:43 PM  Social Worker: Matthew Saras, [place] 05/24/2017 2:43 PM  Recreational Therapist:  05/24/2017  2:43 PM  Other: Lindell Spar, NP 05/24/2017 2:43 PM  Other:  05/24/2017 2:43 PM  Other: 05/24/2017 2:43 PM  Scribe for Treatment Team: Georga Kaufmann, MSW,LCSWA 05/24/2017 2:43 PM

## 2017-05-24 NOTE — Progress Notes (Signed)
Recreation Therapy Notes  Date:  05/24/17 Time: 0930 Location: [address] Dayroom  Group Topic: Stress Management  Goal Area(s) Addresses:  Patient will verbalize importance of using healthy stress management.  Patient will identify positive emotions associated with healthy stress management.   Intervention: Stress Management  Activity :  Guided Imagery.  LRT introduced the stress management technique of guided imagery.  LRT read a script so patients could follow along and imagine being in their peaceful place.  Patients were to follow along as LRT read the script to engage in the activity.  Education:  Stress Management, Discharge Planning.   Education Outcome: Acknowledges edcuation/In group clarification offered/Needs additional education  Clinical Observations/Feedback:  Pt did not attend group.    Victorino Sparrow, LRT/CTRS        Victorino Sparrow A 05/24/2017 12:14 PM
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
## 2017-05-24 NOTE — Progress Notes (Signed)
D) Pt. Affect improving and mood reported as "feeling better".  Pt.focused on d/c this Friday and states she is eager to return to work.  Pt. States she needs to focus on staying sober and clean and is looking forward to spending the weekend with her grandchildren.  Pt. Reports blood sugars have been "good", but pt. Acknowledged eating a piece of blackberry cobbler.  A) Pt. Offered support and encouraged to continue to watch nutrition.  R) Pt. Receptive and remains safe at this time.

## 2017-05-24 NOTE — [facility] Group Notes (Signed)
[facility] Group Therapy 05/24/2017 1:15pm  Type of Therapy: Mental Health Association Presentation  Participation Level: Active  Participation Quality: Attentive  Affect: Appropriate  Cognitive: Oriented  Insight: Developing/Improving  Engagement in Therapy: Engaged  Modes of Intervention: Discussion, Education and Socialization  Summary of Progress/Problems: Mental Health Association ([place]) Speaker came to talk about his personal journey with living with a mental health diagnosis. The pt processed ways by which to relate to the speaker. [place] speaker provided handouts and educational information pertaining to groups and services offered by the [facility]. Pt was engaged in speaker's presentation and was receptive to resources provided.    Georga Kaufmann, MSW, LCSWA 05/24/2017 2:33 PM

## 2017-05-24 NOTE — Progress Notes (Signed)
D- Patient alert and oriented. Patient is in a depressed mood with a flat affect.  Patient currently denies SI, HI, AVH, and pain. Patient states "I've been depressed all day".  Patient reports that she has not attended any therapeutic groups today and states "my medicine had me drowsy so I stayed in bed".  Patient verbalizes that she will try and attend groups next shift.  No further issues reported. A- Scheduled medications administered to patient, per MD orders. Support and encouragement provided.  Routine safety checks conducted every 15 minutes.  Patient informed to notify staff with problems or concerns. R- No adverse drug reactions noted. Patient contracts for safety at this time. Patient compliant with medications and treatment plan. Patient receptive, calm, and cooperative. Patient remains safe at this time.

## 2017-05-25 DIAGNOSIS — F419 Anxiety disorder, unspecified: Secondary | ICD-10-CM

## 2017-05-25 DIAGNOSIS — G4700 Insomnia, unspecified: Secondary | ICD-10-CM

## 2017-05-25 LAB — GLUCOSE, CAPILLARY
GLUCOSE-CAPILLARY: 108 mg/dL — AB (ref 65–99)
Glucose-Capillary: 135 mg/dL — ABNORMAL HIGH (ref 65–99)
Glucose-Capillary: 100 mg/dL — ABNORMAL HIGH (ref 65–99)
Glucose-Capillary: 148 mg/dL — ABNORMAL HIGH (ref 65–99)

## 2017-05-25 NOTE — Progress Notes (Signed)
[facility] MD Progress Note  05/25/2017 9:57 AM Lisa Anderson  MRN:  [id number] Subjective: patient reports she is feeling better, and remains motivated in sobriety. Denies suicidal ideations. At this time denies medication side effects.  Objective : I have discussed case with treatment team and have met with patient . Patient reports feeling better compared to admission, and presents with a fuller range of affect. She is future oriented, and wanting to go to an [employer] setting soon after discharge.Denies suicidal ideations. Denies medication side effects. As per staff report , patient remains constricted in affect, but reports improvement . Limited group participation. No disruptive or agitated behaviors. Presents pleasant and cooperative on approach. She denies cravings for cocaine- we have reviewed importance of avoiding people, places and situations that she associates with drug abuse in order to minimize risk of relapsing .   Principal Problem: MDD (major depressive disorder), recurrent severe, without psychosis ([place]) Diagnosis:   Patient Active Problem List   Diagnosis Date Noted  . Bipolar I disorder ([place]) [F31.9] 05/23/2017  . MDD (major depressive disorder), recurrent severe, without psychosis ([place]) [F33.2] 05/22/2017  . Major depressive disorder, recurrent episode, severe ([place]) [F33.2] 05/21/2017  . Cocaine use disorder, severe, dependence ([place]) [F14.20] 05/21/2017  . Suicidal intent [R45.851] 05/20/2017  . Drug overdose [T50.901A] 05/19/2017  . Family history of colon cancer [Z80.0] 02/23/2017  . Rectal bleeding [K62.5] 02/23/2017  . Gastroesophageal reflux disease without esophagitis [K21.9] 02/23/2017  . Intractable vomiting with nausea [R11.2] 02/23/2017  . Abnormal thyroid blood test [R94.6] 01/02/2017  . Insomnia [G47.00] 01/02/2017  . Anxiety [F41.9] 01/02/2017  . IDDM (insulin dependent diabetes mellitus) ([place]) [E11.9, Z79.4]   . Asthma [J45.909] 11/12/2015  . Acute  respiratory failure ([place]) [J96.00] 11/11/2015  . TIA (transient ischemic attack) [G45.9] 11/01/2015  . Chronic diastolic CHF (congestive heart failure) ([place]) [I50.32] 11/01/2015  . Acute focal neurological deficit [R29.818] 11/01/2015  . 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 HLD (hyperlipidemia) [E78.5]   . Palpitations [R00.2]   . Stroke ([place]) [I63.9] 08/05/2015  . Hypertension [I10] 08/05/2015  . Stroke due to embolism of right posterior cerebral artery ([place]) [I63.431] 08/05/2015  . Diabetes mellitus 08/05/2015  . Alcohol dependency ([place]) [F10.20] 08/10/2013  . Cocaine abuse [F14.10] 08/08/2013  . Alcohol abuse [F10.10] 08/08/2013  . MDD (major depressive disorder) [F32.9] 08/08/2013   Total Time spent with patient: 20 minutes   Past Medical History:  Past Medical History:  Diagnosis Date  . Asthma   . Cancer ([place])    breast  . Chronic congestive heart failure with left ventricular diastolic dysfunction ([place])   . Diabetes mellitus   . Hyperlipidemia   . Hypertension   . Stroke [facility])     Past Surgical History:  Procedure Laterality Date  . BREAST LUMPECTOMY    . EP IMPLANTABLE DEVICE N/A 08/07/2015   Procedure: Loop Recorder Insertion;  Surgeon: Thompson Grayer, MD;  Location: [facility];  Service: Cardiovascular;  Laterality: N/A;  . TEE WITHOUT CARDIOVERSION N/A 08/07/2015   Procedure: TRANSESOPHAGEAL ECHOCARDIOGRAM (TEE);  Surgeon: Sueanne Margarita, MD;  Location: [facility] ENDOSCOPY;  Service: Cardiovascular;  Laterality: N/A;  . TONSILLECTOMY     Family History:  Family History  Problem Relation Age of Onset  . Diabetes Mother   . Hyperlipidemia Mother   . Hypertension Mother   . Breast cancer Mother   . Rheum arthritis Father   . Asthma Father   . Stroke Father   .  Thyroid disease Father   . Thyroid disease Brother   . Colon cancer Brother        in his 63s  . Breast cancer Sister    Social History:  History  Alcohol Use  .  3.6 oz/week  . 6 Cans of beer per week    Comment: occasionally     History  Drug Use  . Types: "Crack" cocaine, Cocaine    Comment: crack cocaine    Social History   Social History  . Marital status: Single    Spouse name: N/A  . Number of children: 5  . Years of education: 12   Occupational History  . CNA- [employer]    Social History Main Topics  . Smoking status: Current Every Day Smoker    Packs/day: 0.50  . Smokeless tobacco: Never Used  . Alcohol use 3.6 oz/week    6 Cans of beer per week     Comment: occasionally  . Drug use: Yes    Types: "Crack" cocaine, Cocaine     Comment: crack cocaine  . Sexual activity: No     Comment: recovering cocaine addict x6 months   Other Topics Concern  . None   Social History Narrative   Lives with daughter      Caffeine use: Drinks soda (2- 20 oz soda per day)   Additional Social History:   Sleep: Good  Appetite:  good  Current Medications: Current Facility-Administered Medications  Medication Dose Route Frequency Provider Last Rate Last Dose  . acetaminophen (TYLENOL) tablet 650 mg  650 mg Oral Q6H PRN Patriciaann Clan E, PA-C      . albuterol (PROVENTIL HFA;VENTOLIN HFA) 108 (90 Base) MCG/ACT inhaler 2 puff  2 puff Inhalation Q6H PRN Okonkwo, Justina A, NP      . aspirin EC tablet 162 mg  162 mg Oral Daily Okonkwo, Justina A, NP   162 mg at 05/25/17 0858  . clopidogrel (PLAVIX) tablet 75 mg  75 mg Oral Daily Okonkwo, Justina A, NP   75 mg at 05/25/17 0858  . diltiazem (CARDIZEM CD) 24 hr capsule 360 mg  360 mg Oral Daily Okonkwo, Justina A, NP   360 mg at 05/25/17 0858  . docusate sodium (COLACE) capsule 100 mg  100 mg Oral BID Money, Lowry Ram, FNP   100 mg at 05/25/17 0858  . DULoxetine (CYMBALTA) DR capsule 60 mg  60 mg Oral Daily Okonkwo, Justina A, NP   60 mg at 05/25/17 0859  . gabapentin (NEURONTIN) capsule 200 mg  200 mg Oral TID Money, Lowry Ram, FNP   200 mg at 05/25/17 0857  . hydrALAZINE  (APRESOLINE) tablet 50 mg  50 mg Oral TID Hughie Closs A, NP   50 mg at 05/25/17 0858  . hydrochlorothiazide (MICROZIDE) capsule 12.5 mg  12.5 mg Oral Daily Money, Lowry Ram, FNP   12.5 mg at 05/25/17 0858  . hydrOXYzine (ATARAX/VISTARIL) tablet 25 mg  25 mg Oral TID PRN Lu Duffel, Justina A, NP      . hydrOXYzine (ATARAX/VISTARIL) tablet 50 mg  50 mg Oral QHS PRN Okonkwo, Justina A, NP   50 mg at 05/24/17 2153  . insulin aspart (novoLOG) injection 0-15 Units  0-15 Units Subcutaneous TID WC Okonkwo, Justina A, NP   3 Units at 05/24/17 1726  . insulin glargine (LANTUS) injection 15 Units  15 Units Subcutaneous QHS Okonkwo, Justina A, NP   15 Units at 05/24/17 2153  . metFORMIN (GLUCOPHAGE) tablet  1,000 mg  1,000 mg Oral BID WC Okonkwo, Justina A, NP   1,000 mg at 05/25/17 0858  . nicotine polacrilex (NICORETTE) gum 2 mg  2 mg Oral PRN Saifan Rayford, Myer Peer, MD   2 mg at 05/24/17 1649  . polyethylene glycol (MIRALAX / GLYCOLAX) packet 17 g  17 g Oral Daily PRN Money, Lowry Ram, FNP   17 g at 05/24/17 6203  . potassium chloride SA (K-DUR,KLOR-CON) CR tablet 20 mEq  20 mEq Oral Daily Money, Lowry Ram, FNP   20 mEq at 05/25/17 0858  . traZODone (DESYREL) tablet 50 mg  50 mg Oral QHS PRN Money, Lowry Ram, FNP   50 mg at 05/24/17 2153    Lab Results:  Results for orders placed or performed during the hospital encounter of 05/22/17 (from the past 48 hour(s))  Glucose, capillary     Status: Abnormal   Collection Time: 05/23/17  5:22 PM  Result Value Ref Range   Glucose-Capillary 103 (H) 65 - 99 mg/dL  Glucose, capillary     Status: Abnormal   Collection Time: 05/23/17  8:51 PM  Result Value Ref Range   Glucose-Capillary 209 (H) 65 - 99 mg/dL  Basic metabolic panel     Status: Abnormal   Collection Time: 05/24/17  6:07 AM  Result Value Ref Range   Sodium 140 135 - 145 mmol/L   Potassium 3.5 3.5 - 5.1 mmol/L   Chloride 103 101 - 111 mmol/L   CO2 27 22 - 32 mmol/L   Glucose, Bld 127 (H) 65 - 99 mg/dL    BUN 19 6 - 20 mg/dL   Creatinine, Ser 1.08 (H) 0.44 - 1.00 mg/dL   Calcium 9.6 8.9 - 10.3 mg/dL   GFR calc non Af Amer 55 (L) >60 mL/min   GFR calc Af Amer >60 >60 mL/min    Comment: (NOTE) The eGFR has been calculated using the CKD EPI equation. This calculation has not been validated in all clinical situations. eGFR's persistently <60 mL/min signify possible Chronic Kidney Disease.    Anion gap 10 5 - 15    Comment: Performed at [facility], [place] [address] [place], [place] [address]  Glucose, capillary     Status: Abnormal   Collection Time: 05/24/17  6:07 AM  Result Value Ref Range   Glucose-Capillary 134 (H) 65 - 99 mg/dL  Glucose, capillary     Status: Abnormal   Collection Time: 05/24/17 11:57 AM  Result Value Ref Range   Glucose-Capillary 114 (H) 65 - 99 mg/dL   Comment 1 Notify RN   Glucose, capillary     Status: Abnormal   Collection Time: 05/24/17  5:19 PM  Result Value Ref Range   Glucose-Capillary 161 (H) 65 - 99 mg/dL  Glucose, capillary     Status: Abnormal   Collection Time: 05/24/17  9:06 PM  Result Value Ref Range   Glucose-Capillary 134 (H) 65 - 99 mg/dL   Comment 1 Notify RN    Comment 2 Document in Chart   Glucose, capillary     Status: Abnormal   Collection Time: 05/25/17  5:45 AM  Result Value Ref Range   Glucose-Capillary 135 (H) 65 - 99 mg/dL   Comment 1 Notify RN    Comment 2 Document in Chart     Blood Alcohol level:  Lab Results  Component Value Date   ETH <5 05/19/2017   ETH <5 16/38/4536    Metabolic Disorder Labs: Lab Results  Component Value Date   HGBA1C 8.6 01/02/2017   MPG 237 11/02/2015   MPG 237 08/06/2015   No results found for: PROLACTIN Lab Results  Component Value Date   CHOL 200 11/02/2015   TRIG 183 (H) 11/02/2015   HDL 36 (L) 11/02/2015   CHOLHDL 5.6 11/02/2015   VLDL 37 11/02/2015   LDLCALC 127 (H) 11/02/2015   LDLCALC 138 (H) 08/06/2015    Physical Findings: AIMS: Facial and Oral  Movements Muscles of Facial Expression: None, normal Lips and Perioral Area: None, normal Jaw: None, normal Tongue: None, normal,Extremity Movements Upper (arms, wrists, hands, fingers): None, normal Lower (legs, knees, ankles, toes): None, normal, Trunk Movements Neck, shoulders, hips: None, normal, Overall Severity Severity of abnormal movements (highest score from questions above): None, normal Incapacitation due to abnormal movements: None, normal Patient's awareness of abnormal movements (rate only patient's report): No Awareness, Dental Status Current problems with teeth and/or dentures?: No Does patient usually wear dentures?: No  CIWA:    COWS:     Musculoskeletal: Strength & Muscle Tone: within normal limits Gait & Station: normal Patient leans: N/A  Psychiatric Specialty Exam: Physical Exam  ROS no headache, no chest pain, no shortness of breath Denies any bleeding - is on Plavix/ASA  Blood pressure 132/65, pulse 87, temperature 98.4 F (36.9 C), temperature source Oral, resp. rate 16, height '5\' 5"'  (1.651 m), weight 73 kg (161 lb).Body mass index is 26.79 kg/m.  General Appearance: improving grooming   Eye Contact:  improving   Speech:  Normal Rate  Volume:  Normal  Mood:  less depressed, reports feeling better  Affect:  more reactive  Thought Process:  Linear and Descriptions of Associations: Intact  Orientation:  Full (Time, Place, and Person)  Thought Content:  denies hallucinations, no delusions, not internally preoccupied   Suicidal Thoughts:  No- denies suicidal plan or intention and is able to contract for safety on unit at this time, denies homicidal or violent ideations  Homicidal Thoughts:  No  Memory:  recent and remote grossly intact   Judgement:  Other:  improved   Insight:  improving   Psychomotor Activity:  Normal  Concentration:  Concentration: Good and Attention Span: Good  Recall:  Good  Fund of Knowledge:  Good  Language:  Good  Akathisia:   Negative  Handed:  Right  AIMS (if indicated):     Assets:  Communication Skills Desire for Improvement Resilience  ADL's:  Intact  Cognition:  WNL  Sleep:  Number of Hours: 6.25   Assessment -  Patient has continued to present with a constricted affect and limited milieu participation, but does present improved compared to admission. At this time she states she is feeling significantly better than she did prior to admission and is future oriented, hoping for discharge soon and planning on going to an [facility] soon after discharge. Denies medication side effects, No SI.   Treatment Plan Summary: Treatment plan  Reviewed as below today 8/30 Daily contact with patient to assess and evaluate symptoms and progress in treatment, Medication management, Plan inpatient treatment  and medications as below Encourage group and milieu participation to work on coping skills and symptom reduction Encourage efforts to work on recovery and relapse prevention Continue Cymbalta 60 mgrs QDAY for depression, anxiety Continue Neurontin 200 mgrs TID for anxiety, pain Continue Trazodone 50 mgrs QHS PRN for insomnia as needed  Continue Vistaril 25 mgrs Q 8 hours PRN for anxiety as needed  Continue Plavix and  ASA for history of CVA- patient aware of potential increased risk of bleeding, particularly GI bleed, denies any current symptoms Jenne Campus, MD 05/25/2017, 9:57 AM   Patient ID: Lisa Anderson, female   DOB: 07-03-1956, 61 y.o.   MRN: [id number]

## 2017-05-25 NOTE — Plan of Care (Signed)
Problem: Safety: Goal: Periods of time without injury will increase Outcome: Completed/Met Date Met: 05/25/17 Patient has not engaged in self harm. Denies thoughts to do so, no SI.  Problem: Medication: Goal: Compliance with prescribed medication regimen will improve Outcome: Completed/Met Date Met: 05/25/17 Patient has been med compliant and verbalizes commitment to remain so upon discharge.

## 2017-05-25 NOTE — Plan of Care (Signed)
Problem: Coping: Goal: Ability to cope will improve Outcome: Progressing Pt denies SI at this time, pt stated she felt better about her situation than she did before coming in

## 2017-05-25 NOTE — Plan of Care (Signed)
Problem: Safety: Goal: Periods of time without injury will increase Outcome: Progressing Pt safe on the unit at this time   

## 2017-05-25 NOTE — BHH Group Notes (Signed)
LCSW Group Therapy Note  05/25/2017 1:15pm  Type of Therapy/Topic:  Group Therapy:  Balance in Life  Participation Level:  Did Not Attend  Description of Group:    This group will address the concept of balance and how it feels and looks when one is unbalanced. Patients will be encouraged to process areas in their lives that are out of balance and identify reasons for remaining unbalanced. Facilitators will guide patients in utilizing problem-solving interventions to address and correct the stressor making their life unbalanced. Understanding and applying boundaries will be explored and addressed for obtaining and maintaining a balanced life. Patients will be encouraged to explore ways to assertively make their unbalanced needs known to significant others in their lives, using other group members and facilitator for support and feedback.  Therapeutic Goals: 1. Patient will identify two or more emotions or situations they have that consume much of in their lives. 2. Patient will identify signs/triggers that life has become out of balance:  3. Patient will identify two ways to set boundaries in order to achieve balance in their lives:  4. Patient will demonstrate ability to communicate their needs through discussion and/or role plays    Therapeutic Modalities:   Cognitive Behavioral Therapy Solution-Focused Therapy Assertiveness Training  Gladstone Lighter, LCSW 05/25/2017 2:52 PM

## 2017-05-25 NOTE — Progress Notes (Signed)
Date: 05/25/17 Time: 0900 Group: Nursing  Patient attended group and appeared engaged.  Patient listened attentively and asked appropriate questions.  Comer Locket., RN

## 2017-05-25 NOTE — BHH Group Notes (Signed)
Pt attended spiritual care group on grief and loss facilitated by chaplain Mekayla Soman   Group opened with brief discussion and psycho-social ed around grief and loss in relationships and in relation to self - identifying life patterns, circumstances, changes that cause losses. Established group norm of speaking from own life experience. Group goal of establishing open and affirming space for members to share loss and experience with grief, normalize grief experience and provide psycho social education and grief support.     

## 2017-05-25 NOTE — Progress Notes (Signed)
D: Pt denies SI/HI/AVH. Pt is pleasant and cooperative. Pt stated she felt better about her living situation, pt said she wants the [place], she understands she can not stay sober around her daughter ant her friends.   A: Pt was offered support and encouragement. Pt was given scheduled medications. Pt was encourage to attend groups. Q 15 minute checks were done for safety.   R:Pt attends groups and interacts well with peers and staff. Pt is taking medication. Pt has no complaints.Pt receptive to treatment and safety maintained on unit.

## 2017-05-25 NOTE — Progress Notes (Signed)
D: Patient observed up and around the milieu though interaction with others is minimal. Cautious in approach. Completed hygiene this AM. Frequent contacts made 1:1 throughout morning. Patient verbalizes to this Probation officer she is starting to improve. Patient's affect flat, mood depressed. Per self inventory and discussions with writer, rates depression at a 4/10, hopelessness at a 4/10 and anxiety at a 5/10. Rates sleep as good, appetite as fair, energy as normal and concentration as good.  States goal for today is to "just feel better and get out of bed." Complaining of L leg nerve pain and states neurontin generally manages it. No other physical problems.   A: Medicated per orders, prn tylenol given for discomfort. Level III obs in place for safety. Emotional support offered and self inventory reviewed. Encouraged completion of Suicide Safety Plan and programming participation. Discussed POC with MD, SW.  R: Patient verbalizes understanding of POC. Will reassess pain in one hour. Patient denies SI/HI/AVH and remains safe on level III obs. Will continue to monitor closely and make verbal contact frequently.

## 2017-05-25 NOTE — Progress Notes (Signed)
D: Pt denies SI/HI/AVH. Pt is pleasant and cooperative. Pt interacts but minimally, pt stated she was feeling good today.  A: Pt was offered support and encouragement. Pt was given scheduled medications. Pt was encourage to attend groups. Q 15 minute checks were done for safety.   R:Pt attends groups and interacts well with peers and staff. Pt is taking medication. Pt has no complaints.Pt receptive to treatment and safety maintained on unit.

## 2017-05-26 ENCOUNTER — Ambulatory Visit (INDEPENDENT_AMBULATORY_CARE_PROVIDER_SITE_OTHER): Payer: Self-pay | Admitting: *Deleted

## 2017-05-26 DIAGNOSIS — I63011 Cerebral infarction due to thrombosis of right vertebral artery: Secondary | ICD-10-CM

## 2017-05-26 LAB — CUP PACEART REMOTE DEVICE CHECK
Date Time Interrogation Session: 20180831153922
MDC IDC PG IMPLANT DT: 20161111

## 2017-05-26 LAB — GLUCOSE, CAPILLARY
GLUCOSE-CAPILLARY: 83 mg/dL (ref 65–99)
Glucose-Capillary: 76 mg/dL (ref 65–99)

## 2017-05-26 MED ORDER — DULOXETINE HCL 60 MG PO CPEP: 60.0000 mg | ORAL_CAPSULE | Freq: Every day | ORAL | 0 refills | Status: DC

## 2017-05-26 MED ORDER — ALBUTEROL SULFATE HFA 108 (90 BASE) MCG/ACT IN AERS: 2.0000 | INHALATION_SPRAY | Freq: Four times a day (QID) | RESPIRATORY_TRACT | 0 refills | Status: DC | PRN

## 2017-05-26 MED ORDER — GABAPENTIN 100 MG PO CAPS: 200.0000 mg | ORAL_CAPSULE | Freq: Three times a day (TID) | ORAL | 0 refills | Status: DC

## 2017-05-26 MED ORDER — HYDROXYZINE HCL 50 MG PO TABS: 50.0000 mg | ORAL_TABLET | Freq: Three times a day (TID) | ORAL | 0 refills | Status: DC | PRN

## 2017-05-26 MED ORDER — HYDROCHLOROTHIAZIDE 12.5 MG PO CAPS: 12.5000 mg | ORAL_CAPSULE | Freq: Every day | ORAL | 0 refills | Status: DC

## 2017-05-26 MED ORDER — DILTIAZEM HCL ER COATED BEADS 360 MG PO CP24: 360.0000 mg | ORAL_CAPSULE | Freq: Every day | ORAL | 0 refills | Status: DC

## 2017-05-26 MED ORDER — ASPIRIN 81 MG PO TBEC: 162.0000 mg | DELAYED_RELEASE_TABLET | Freq: Every day | ORAL | 0 refills | Status: DC

## 2017-05-26 MED ORDER — METFORMIN HCL 1000 MG PO TABS: 1000.0000 mg | ORAL_TABLET | Freq: Two times a day (BID) | ORAL | 0 refills | Status: DC

## 2017-05-26 MED ORDER — TRAZODONE HCL 50 MG PO TABS: 50.0000 mg | ORAL_TABLET | Freq: Every evening | ORAL | 0 refills | Status: DC | PRN

## 2017-05-26 MED ORDER — INSULIN GLARGINE 100 UNIT/ML ~~LOC~~ SOLN: 15.0000 [IU] | Freq: Every day | SUBCUTANEOUS | 0 refills | Status: DC

## 2017-05-26 MED ORDER — POTASSIUM CHLORIDE CRYS ER 20 MEQ PO TBCR: 20.0000 meq | EXTENDED_RELEASE_TABLET | Freq: Every day | ORAL | 0 refills | Status: DC

## 2017-05-26 MED ORDER — CLOPIDOGREL BISULFATE 75 MG PO TABS: 75.0000 mg | ORAL_TABLET | Freq: Every day | ORAL | 0 refills | Status: DC

## 2017-05-26 MED ORDER — INSULIN ASPART 100 UNIT/ML ~~LOC~~ SOLN: 0.0000 [IU] | Freq: Three times a day (TID) | SUBCUTANEOUS | 0 refills | Status: DC

## 2017-05-26 MED ORDER — HYDRALAZINE HCL 50 MG PO TABS: 50.0000 mg | ORAL_TABLET | Freq: Three times a day (TID) | ORAL | 0 refills | Status: DC

## 2017-05-26 NOTE — BHH Suicide Risk Assessment (Signed)
[facility] Discharge Suicide Risk Assessment   Principal Problem: MDD (major depressive disorder), recurrent severe, without psychosis ([place]) Discharge Diagnoses:  Patient Active Problem List   Diagnosis Date Noted  . Bipolar I disorder ([place]) [F31.9] 05/23/2017  . MDD (major depressive disorder), recurrent severe, without psychosis ([place]) [F33.2] 05/22/2017  . Major depressive disorder, recurrent episode, severe ([place]) [F33.2] 05/21/2017  . Cocaine use disorder, severe, dependence ([place]) [F14.20] 05/21/2017  . Suicidal intent [R45.851] 05/20/2017  . Drug overdose [T50.901A] 05/19/2017  . Family history of colon cancer [Z80.0] 02/23/2017  . Rectal bleeding [K62.5] 02/23/2017  . Gastroesophageal reflux disease without esophagitis [K21.9] 02/23/2017  . Intractable vomiting with nausea [R11.2] 02/23/2017  . Abnormal thyroid blood test [R94.6] 01/02/2017  . Insomnia [G47.00] 01/02/2017  . Anxiety [F41.9] 01/02/2017  . IDDM (insulin dependent diabetes mellitus) ([place]) [E11.9, Z79.4]   . Asthma [J45.909] 11/12/2015  . Acute respiratory failure ([place]) [J96.00] 11/11/2015  . TIA (transient ischemic attack) [G45.9] 11/01/2015  . Chronic diastolic CHF (congestive heart failure) ([place]) [I50.32] 11/01/2015  . Acute focal neurological deficit [R29.818] 11/01/2015  . 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 HLD (hyperlipidemia) [E78.5]   . Palpitations [R00.2]   . Stroke ([place]) [I63.9] 08/05/2015  . Hypertension [I10] 08/05/2015  . Stroke due to embolism of right posterior cerebral artery ([place]) [I63.431] 08/05/2015  . Diabetes mellitus 08/05/2015  . Alcohol dependency ([place]) [F10.20] 08/10/2013  . Cocaine abuse [F14.10] 08/08/2013  . Alcohol abuse [F10.10] 08/08/2013  . MDD (major depressive disorder) [F32.9] 08/08/2013    Total Time spent with patient: 30 minutes  Musculoskeletal: Strength & Muscle Tone: within normal limits Gait & Station: normal Patient leans:  N/A  Psychiatric Specialty Exam: ROS denies headache, no visual disturbances, no chest pain, no shortness of breath  Blood pressure (!) 163/64, pulse 92, temperature 98.4 F (36.9 C), temperature source Oral, resp. rate 16, height 5\' 5"  (1.651 m), weight 73 kg (161 lb).Body mass index is 26.79 kg/m.  General Appearance: Well Groomed  Eye Contact::  Good  Speech:  Normal Rate409  Volume:  Normal  Mood:  improved, presents euthymic at this time  Affect:  Appropriate and reactive   Thought Process:  Goal Directed and Linear  Orientation:  Full (Time, Place, and Person)  Thought Content:  no hallucinations, no delusions  Suicidal Thoughts:  No denies suicidal or self injurious ideations, no homicidal ideation   Homicidal Thoughts:  No  Memory:  recent and remote grossly intact   Judgement:  Other:  improved   Insight:  improved   Psychomotor Activity:  Normal  Concentration:  Good  Recall:  Good  Fund of Knowledge:Good  Language: Good  Akathisia:  Negative  Handed:  Right  AIMS (if indicated):     Assets:  Communication Skills Desire for Improvement Resilience  Sleep:  Number of Hours: 6.25  Cognition: WNL  ADL's:  Intact   Mental Status Per Nursing Assessment::   On Admission:  Suicidal ideation indicated by patient  Demographic Factors:  61 year old , has 5 adult children, lives with a daughter, employed   Loss Factors: Substance abuse , strain in her relationship with family, daughter related to same  Historical Factors: History of depression, history of cocaine abuse in binges, history of prior suicidal ideations  Risk Reduction Factors:   Sense of responsibility to family, Employed, Living with another person, especially a relative and Positive coping skills or problem solving skills  Continued Clinical Symptoms:  At this time patient is alert, attentive, well groomed, mood is "OK", and denies depression, affect is brighter than on admission, no thought disorder,  no suicidal or self injurious ideations, no homicidal or violent ideations, no psychotic symptoms, future oriented. Pleasant on approach, behavior on unit calm and in good control. Denies medication side effects . Patient insightful about cocaine use disorder, and expressing motivation in sobriety and recovery , at this time reporting her plan is to go to an [place] soon  Cognitive Features That Contribute To Risk:  No gross cognitive deficits noted upon discharge. Is alert , attentive, and oriented x 3  Suicide Risk:  Mild:  Suicidal ideation of limited frequency, intensity, duration, and specificity.  There are no identifiable plans, no associated intent, mild dysphoria and related symptoms, good self-control (both objective and subjective assessment), few other risk factors, and identifiable protective factors, including available and accessible social support.  Follow-up Information    [facility] up.   Specialty:  Behavioral Health Why:  Hospital discharge follow up appointment on  Contact information: [place] [address] [phone]           Plan Of Care/Follow-up recommendations:  Activity:  as tolerated Diet:  heart healthy Tests:  NA Other:  See below Patient is expressing readiness for discharge and is leaving unit in good spirits  Plans to return home and plans to go to an [facility] in the next few days  We reviewed importance of staying away from people, places and situations associated with drug use, to minimize risk of relapse - 12 step program participation recommended. Follow up as above Jenne Campus, MD 05/26/2017, 8:45 AM

## 2017-05-26 NOTE — Progress Notes (Signed)
Nursing Discharge Note 05/26/2017 [id number]  Data Reports sleeping good with PRN sleep med.  Did not complete self-inventory.  Affect appropriate.  Denies HI, SI, AVH.  In milieu, reports feeling ready to discharge today. Plans to go to [place], states her daughter is picking her up.  Received discharge orders.  Action Spoke with patient 1:1, nurse offered support to patient throughout shift.  Reviewed medications, discharge instructions, and follow up appointments with patient. Medication samples and scripts reviewed and given to patient.  Paperwork, AVS, SRA, and transition record handed to patient.   Escorted off of unit at 1354. Belongings returned per belongings form.  Discharged to lobby where she was met by daughter.    Response Verbalized understanding of discharge teaching. Agrees to contact someone or 911 with thoughts/intent to harm self or others.    To follow up per AVS.

## 2017-05-26 NOTE — Progress Notes (Signed)
Recreation Therapy Notes  Date: 05/26/17 Time: 0930 Location: 500 Hall Dayroom  Group Topic: Stress Management  Goal Area(s) Addresses:  Patient will verbalize importance of using healthy stress management.  Patient will identify positive emotions associated with healthy stress management.   Behavioral Response: Engaged  Intervention: Stress Management  Activity :  Progressive Muscle Relaxation.  LRT introduced the stress management technique of progressive muscle relaxation.  Patients were to follow along as LRT read script to fully engage in the technique.  Education:  Stress Management, Discharge Planning.   Education Outcome: Acknowledges edcuation/In group clarification offered/Needs additional education  Clinical Observations/Feedback: Pt attended group.   Victorino Sparrow, LRT/CTRS         Victorino Sparrow A 05/26/2017 12:07 PM

## 2017-05-26 NOTE — Progress Notes (Signed)
[facility] summary report received. Battery status OK. Normal device function. No new symptom episodes, tachy episodes, brady, or pause episodes. No new AF episodes. Monthly summary reports and ROV/PRN 

## 2017-05-26 NOTE — Discharge Summary (Signed)
Physician Discharge Summary Note  Patient:  Lisa [facility] is an 61 y.o., female MRN:  [id number] DOB:  September 06, 1956 Patient phone:  [phone] ([facility])  Patient address:   [address] [place] [address],  Total Time spent with patient: 20 minutes  Date of Admission:  05/22/2017 Date of Discharge: 05/26/17   Reason for Admission:  Substance abuse with worsening depression with suicide attempt by overdose of Trazodone  Principal Problem: MDD (major depressive disorder), recurrent severe, without psychosis [facility]) Discharge Diagnoses: Patient Active Problem List   Diagnosis Date Noted  . Bipolar I disorder (Lisa [facility]) [F31.9] 05/23/2017  . MDD (major depressive disorder), recurrent severe, without psychosis ([facility]) [F33.2] 05/22/2017  . Major depressive disorder, recurrent episode, severe ([facility]) [F33.2] 05/21/2017  . Cocaine use disorder, severe, dependence ([facility]isa [facility]) [F14.20] 05/21/2017  . Suicidal intent [R45.851] 05/20/2017  . Drug overdose [T50.901A] 05/19/2017  . Family history of colon cancer [Z80.0] 02/23/2017  . Rectal bleeding [K62.5] 02/23/2017  . Gastroesophageal reflux disease without esophagitis [K21.9] 02/23/2017  . Intractable vomiting with nausea [R11.2] 02/23/2017  . Abnormal thyroid blood test [R94.6] 01/02/2017  . Insomnia [G47.00] 01/02/2017  . Anxiety [F41.9] 01/02/2017  . IDDM (insulin dependent diabetes mellitus) ([place]) [E11.9, Z79.4]   . Asthma [J45.909] 11/12/2015  . Acute respiratory failure ([place]) [J96.00] 11/11/2015  . TIA (transient ischemic attack) [G45.9] 11/01/2015  . Chronic diastolic CHF (congestive heart failure) ([place]) [I50.32] 11/01/2015  . Acute focal neurological deficit [R29.818] 11/01/2015  . 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 HLD (hyperlipidemia) [E78.5]   . Palpitations [R00.2]   . Stroke ([place]) [I63.9] 08/05/2015  . Hypertension [I10] 08/05/2015  . Stroke due to embolism of right posterior cerebral artery  ([place]) [I63.431] 08/05/2015  . Diabetes mellitus 08/05/2015  . Alcohol dependency ([place]) [F10.20] 08/10/2013  . Cocaine abuse [F14.10] 08/08/2013  . Alcohol abuse [F10.10] 08/08/2013  . MDD (major depressive disorder) [F32.9] 08/08/2013    Past Psychiatric History: Cocaine abuse, MDD, ETOH abuse, Bipolar I  Past Medical History:  Past Medical History:  Diagnosis Date  . Asthma   . Cancer (Lisa [facility])    breast  . Chronic congestive heart failure with left ventricular diastolic dysfunction (Lisa Anderson)   . Diabetes mellitus   . Hyperlipidemia   . Hypertension   . Stroke [facility])     Past Surgical History:  Procedure Laterality Date  . BREAST LUMPECTOMY    . EP IMPLANTABLE DEVICE N/A 08/07/2015   Procedure: Loop Recorder Insertion;  Surgeon: Thompson Grayer, MD;  Location: [facility];  Service: Cardiovascular;  Laterality: N/A;  . TEE WITHOUT CARDIOVERSION N/A 08/07/2015   Procedure: TRANSESOPHAGEAL ECHOCARDIOGRAM (TEE);  Surgeon: Sueanne Margarita, MD;  Location: [facility];  Service: Cardiovascular;  Laterality: N/A;  . TONSILLECTOMY     Family History:  Family History  Problem Relation Age of Onset  . Diabetes Mother   . Hyperlipidemia Mother   . Hypertension Mother   . Breast cancer Mother   . Rheum arthritis Father   . Asthma Father   . Stroke Father   . Thyroid disease Father   . Thyroid disease Brother   . Colon cancer Brother        in his 25s  . Breast cancer Sister    Family Psychiatric  History: Son - schizophrenia; Brother, sister, and nephew - mental health (doesn't know diagnosis) Social History:  History  Alcohol Use  . 3.6 oz/week  . 6 Cans of beer per  week    Comment: occasionally     History  Drug Use  . Types: "Crack" cocaine, Cocaine    Comment: crack cocaine    Social History   Social History  . Marital status: Single    Spouse name: N/A  . Number of children: 5  . Years of education: 12   Occupational History  . CNA- [facility]     Social History Main Topics  . Smoking status: Current Every Day Smoker    Packs/day: 0.50  . Smokeless tobacco: Never Used  . Alcohol use 3.6 oz/week    6 Cans of beer per week     Comment: occasionally  . Drug use: Yes    Types: "Crack" cocaine, Cocaine     Comment: crack cocaine  . Sexual activity: No     Comment: recovering cocaine addict x6 months   Other Topics Concern  . None   Social History Narrative   Lives with daughter      Caffeine use: Drinks soda (2- 20 oz soda per day)    [facility] Course:  Admission assessment to [facility]: Lisa Anderson is a 61 year old female being admitted voluntarily to [facility] from [facility]. She came to the ED with attempted overdose of trazodone (1000mg ). She was medically hospitalized. She reported her recent stressors are excessive drug use and her daughter (in which she lives with) asked her to leave because of her excessive drug use. She denied HI or A/V hallucinations. She reported consistently using crack cocaine. He has history of multiple suicide attempts in the past. She is diagnosed with Major Depressive Disorder and Cocaine use. She was pleasant and cooperative during [facility] admission. She continues to report suicidal ideation and is able to contract for safety on the unit. She reported that she was concerned that she got fluids today at the medical floor. "I have chronic heart failure and I haven't been taking my Lasix."  Initial BHH: Patient reports today that she was at the [facility] since 05/19/17 for treatment for her overdose. She states that her daughter kicked her out for her continued cocaine use. Patient states that she has been battling cocaine abuse for 10 years. She also has a history of ETOH abuse as well, but none recently. She states that when her daughter kicked her out she felt it was better not to be here anymore and thought about killing herself. She contineus to state that she overdosed on Trazodone. She blames part of her  substance abuse on her daughter by saying, "When she has friends over and they start drinking and smoking marijuana then I can't stop myself." She is requesting assistance with getting into [facility]. She reports being at [facility] in [place], [place] 3 years ago, [facility] (years ago when it was called [facility]), and was seen in [facility] for mental health.  She has a complaint of some back pain and constipation today. Will start medications.Also started back Kdur and started HCTZ for elevated BP. Patient was continued on Cymbalta 60 mg PO Daily and Neurontin 200 mg PO TID and patient stabilized in 3 days. Patient states she spoke with her daughter and they are getting along well now. Her daughter is picking her up and taking her to stay at [facility]. She agrees to continue her medications and follow up with her outpatient provides as scheduled.She was seen interacting with staff and patients appropriately and she attended groups. She was provided with prescriptions and  a work note for her [facility] stay. Patient denies any SI/HI/AVH and contracts for safety.   Physical Findings: AIMS: Facial and Oral Movements Muscles of Facial Expression: None, normal Lips and Perioral Area: None, normal Jaw: None, normal Tongue: None, normal,Extremity Movements Upper (arms, wrists, hands, fingers): None, normal Lower (legs, knees, ankles, toes): None, normal, Trunk Movements Neck, shoulders, hips: None, normal, Overall Severity Severity of abnormal movements (highest score from questions above): None, normal Incapacitation due to abnormal movements: None, normal Patient's awareness of abnormal movements (rate only patient's report): No Awareness, Dental Status Current problems with teeth and/or dentures?: No Does patient usually wear dentures?: No  CIWA:    COWS:     Musculoskeletal: Strength & Muscle Tone: within normal limits Gait & Station: normal Patient leans:  N/A  Psychiatric Specialty Exam: Physical Exam  Nursing note and vitals reviewed. Constitutional: She is oriented to person, place, and time. She appears well-developed and well-nourished.  Respiratory: Effort normal.  Musculoskeletal: Normal range of motion.  Neurological: She is alert and oriented to person, place, and time.  Skin: Skin is warm.    Review of Systems  Constitutional: Negative.   HENT: Negative.   Eyes: Negative.   Respiratory: Negative.   Cardiovascular: Negative.   Gastrointestinal: Negative.   Genitourinary: Negative.   Musculoskeletal: Negative.   Skin: Negative.   Neurological: Negative.   Endo/Heme/Allergies: Negative.     Blood pressure (!) 178/63, pulse 92, temperature 98.2 F (36.8 C), resp. rate 16, height 5\' 5"  (1.651 m), weight 73 kg (161 lb).Body mass index is 26.79 kg/m.  General Appearance: Casual  Eye Contact:  Good  Speech:  Clear and Coherent and Normal Rate  Volume:  Normal  Mood:  Euthymic  Affect:  Appropriate  Thought Process:  Coherent and Descriptions of Associations: Intact  Orientation:  Full (Time, Place, and Person)  Thought Content:  WDL  Suicidal Thoughts:  No  Homicidal Thoughts:  No  Memory:  Immediate;   Good Recent;   Good  Judgement:  Good  Insight:  Good  Psychomotor Activity:  Normal  Concentration:  Concentration: Good and Attention Span: Good  Recall:  Good  Fund of Knowledge:  Good  Language:  Good  Akathisia:  Negative  Handed:  Right  AIMS (if indicated):     Assets:  Communication Skills Desire for Improvement Housing Resilience Social Support Transportation  ADL's:  Intact  Cognition:  WNL  Sleep:  Number of Hours: 6.25     Have you used any form of tobacco in the last 30 days? (Cigarettes, Smokeless Tobacco, Cigars, and/or Pipes): Yes  Has this patient used any form of tobacco in the last 30 days? (Cigarettes, Smokeless Tobacco, Cigars, and/or Pipes) Yes, Yes, A prescription for an  FDA-approved tobacco cessation medication was offered at discharge and the patient refused  Blood Alcohol level:  [facility] Results  Component Value Date   [facility] <5 05/19/2017   ETH <5 29/93/7169    Metabolic Disorder Labs:  [facility] Results  Component Value Date   HGBA1C 8.6 01/02/2017   MPG 237 11/02/2015   MPG 237 08/06/2015   No results found for: PROLACTIN [facility] Results  Component Value Date   CHOL 200 11/02/2015   TRIG 183 (H) 11/02/2015   HDL 36 (L) 11/02/2015   CHOLHDL 5.6 11/02/2015   VLDL 37 11/02/2015   LDLCALC 127 (H) 11/02/2015   LDLCALC 138 (H) 08/06/2015    See Psychiatric Specialty Exam and Suicide Risk Assessment completed by  Attending Physician prior to discharge.  Discharge destination:  [facility]  Is patient on multiple antipsychotic therapies at discharge:  No   Has Patient had three or more failed trials of antipsychotic monotherapy by history:  No  Recommended Plan for Multiple Antipsychotic Therapies: NA   Allergies as of 05/26/2017      Reactions   Prednisone Hives, Other (See Comments)   Made her "crazy", suicidal   Benicar [olmesartan] Other (See Comments)   Did not work at all for patient   Celexa [citalopram] Other (See Comments)   Made patient feel crazy, fluoxetine is fine   Effexor [venlafaxine] Other (See Comments)   Made patient feel crazy   Shrimp [shellfish Allergy] Hives, Swelling      Medication List    STOP taking these medications   beclomethasone 80 MCG/ACT inhaler Commonly known as:  QVAR     TAKE these medications     Indication  albuterol 108 (90 Base) MCG/ACT inhaler Commonly known as:  PROVENTIL HFA;VENTOLIN HFA Inhale 2 puffs into the lungs every 6 (six) hours as needed for wheezing or shortness of breath. Shortness of breath What changed:  Another medication with the same name was added. Make sure you understand how and when to take each.  Indication:  Asthma   albuterol 108 (90 Base) MCG/ACT inhaler Commonly known as:   PROVENTIL HFA;VENTOLIN HFA Inhale 2 puffs into the lungs every 6 (six) hours as needed for wheezing or shortness of breath. What changed:  You were already taking a medication with the same name, and this prescription was added. Make sure you understand how and when to take each.  Indication:  Asthma   aspirin 81 MG EC tablet Take 2 tablets (162 mg total) by mouth daily.  Indication:  History of blood clots   clopidogrel 75 MG tablet Commonly known as:  PLAVIX Take 1 tablet (75 mg total) by mouth daily. What changed:  Another medication with the same name was added. Make sure you understand how and when to take each.  Indication:  History of blood clots   clopidogrel 75 MG tablet Commonly known as:  PLAVIX Take 1 tablet (75 mg total) by mouth daily. What changed:  You were already taking a medication with the same name, and this prescription was added. Make sure you understand how and when to take each.  Indication:  history of blood clots   diltiazem 360 MG 24 hr capsule Commonly known as:  CARDIZEM CD Take 1 capsule (360 mg total) by mouth daily. What changed:  Another medication with the same name was added. Make sure you understand how and when to take each.  Indication:  Chronic Angina Following Exercise, Stress, or Excitement, High Blood Pressure Disorder   diltiazem 360 MG 24 hr capsule Commonly known as:  CARDIZEM CD Take 1 capsule (360 mg total) by mouth daily. What changed:  You were already taking a medication with the same name, and this prescription was added. Make sure you understand how and when to take each.  Indication:  High Blood Pressure Disorder   DULoxetine 60 MG capsule Commonly known as:  CYMBALTA Take 1 capsule (60 mg total) by mouth daily. For mood control What changed:  additional instructions  Indication:  Major Depressive Disorder   gabapentin 100 MG capsule Commonly known as:  NEURONTIN Take 2 capsules (200 mg total) by mouth 3 (three) times  daily. What changed:  when to take this  Indication:  Cocaine Dependence, Trouble Sleeping  glipiZIDE 5 MG tablet Commonly known as:  GLUCOTROL Take 1 tablet (5 mg total) by mouth 2 (two) times daily before a meal.  Indication:  Type 2 Diabetes   hydrALAZINE 50 MG tablet Commonly known as:  APRESOLINE Take 1 tablet (50 mg total) by mouth 3 (three) times daily. For High blood pressure What changed:  additional instructions  Indication:  High Blood Pressure Disorder   hydrochlorothiazide 12.5 MG capsule Commonly known as:  MICROZIDE Take 1 capsule (12.5 mg total) by mouth daily.  Indication:  High Blood Pressure Disorder   hydrOXYzine 50 MG tablet Commonly known as:  ATARAX/VISTARIL Take 1 tablet (50 mg total) by mouth 3 (three) times daily as needed (sleep). And sleep  Indication:  Feeling Anxious, and sleep   insulin aspart 100 UNIT/ML injection Commonly known as:  novoLOG Sliding scale CBG 70 - 120: 0 units CBG 121 - 150: 1 unit,  CBG 151 - 200: 2 units,  CBG 201 - 250: 3 units,  CBG 251 - 300: 5 units,  CBG 301 - 350: 7 units,  CBG 351 - 400: 9 units   CBG > 400: 9 units and notify your MD What changed:  Another medication with the same name was added. Make sure you understand how and when to take each.  Indication:  Type 2 Diabetes   insulin aspart 100 UNIT/ML injection Commonly known as:  novoLOG Inject 0-15 Units into the skin 3 (three) times daily with meals. What changed:  You were already taking a medication with the same name, and this prescription was added. Make sure you understand how and when to take each.  Indication:  Type 2 Diabetes   insulin glargine 100 UNIT/ML injection Commonly known as:  LANTUS Inject 0.15 mLs (15 Units total) into the skin at bedtime.  Indication:  Type 2 Diabetes   metFORMIN 1000 MG tablet Commonly known as:  GLUCOPHAGE Take 1 tablet (1,000 mg total) by mouth 2 (two) times daily with a meal.  Indication:  Type 2 Diabetes    potassium chloride SA 20 MEQ tablet Commonly known as:  K-DUR,KLOR-CON Take 1 tablet (20 mEq total) by mouth daily. What changed:  Another medication with the same name was added. Make sure you understand how and when to take each.  Indication:  Low Amount of Potassium in the Blood   potassium chloride SA 20 MEQ tablet Commonly known as:  K-DUR,KLOR-CON Take 1 tablet (20 mEq total) by mouth daily. What changed:  You were already taking a medication with the same name, and this prescription was added. Make sure you understand how and when to take each.  Indication:  Low Amount of Potassium in the Blood   rosuvastatin 20 MG tablet Commonly known as:  CRESTOR Take 2 tablets (40 mg total) by mouth daily.  Indication:  High Amount of Fats in the Blood   traZODone 50 MG tablet Commonly known as:  DESYREL Take 1 tablet (50 mg total) by mouth at bedtime as needed for sleep.  Indication:  Trouble Sleeping      Follow-up Information    [facility] Follow up on 05/30/2017.   Specialty:  Behavioral Health Why:  at 8:00am for your [facility] discharge appointment. Please plan to stay for a few hours. Please bring a photo ID.  Contact information: [address] [place] [address] [phone]           Follow-up recommendations:  Continue activity as tolerated. Continue diet as recommended by your  PCP. Ensure to keep all appointments with outpatient providers.  Comments:  Patient is instructed prior to discharge to: Take all medications as prescribed by his/her mental healthcare provider. Report any adverse effects and or reactions from the medicines to his/her outpatient provider promptly. Patient has been instructed & cautioned: To not engage in alcohol and or illegal drug use while on prescription medicines. In the event of worsening symptoms, patient is instructed to call the crisis hotline, 911 and or go to the nearest ED for appropriate evaluation and treatment of symptoms. To  follow-up with his/her primary care provider for your other medical issues, concerns and or health care needs.    Signed: [place], FNP 05/26/2017, 12:43 PM   Patient seen, Suicide Assessment Completed.  Disposition Plan Reviewed

## 2017-05-26 NOTE — Progress Notes (Signed)
  [facility] Adult Case Management Discharge Plan :  Will you be returning to the same living situation after discharge:  Yes,  Pt returning home At discharge, do you have transportation home?: Yes,  Pt family to pick up Do you have the ability to pay for your medications: Yes,  Pt provided with samples and prescriptions  Release of information consent forms completed and in the chart;  Patient's signature needed at discharge.  Patient to Follow up at: Follow-up Information    [facility] Follow up on 05/30/2017.   Specialty:  Behavioral Health Why:  at 8:00am for your hospital discharge appointment. Please plan to stay for a few hours. Please bring a photo ID.  Contact information: [place] [address] [phone]           Next level of care provider has access to [place] and Suicide Prevention discussed: Yes,  with Pt; declined family contact  Have you used any form of tobacco in the last 30 days? (Cigarettes, Smokeless Tobacco, Cigars, and/or Pipes): Yes  Has patient been referred to the Quitline?: Patient refused referral  Patient has been referred for addiction treatment: Yes  [place] Lighter, LCSW 05/26/2017, 11:30 AM

## 2017-05-26 NOTE — Progress Notes (Signed)
[facility] Summary Report / Loop Recorder 

## 2017-06-05 NOTE — Telephone Encounter (Signed)
Ms Coventry was a NO CALL- NO SHOW for her ECL procedure.

## 2017-06-26 ENCOUNTER — Ambulatory Visit (INDEPENDENT_AMBULATORY_CARE_PROVIDER_SITE_OTHER): Payer: Self-pay | Admitting: *Deleted

## 2017-06-26 DIAGNOSIS — I63011 Cerebral infarction due to thrombosis of right vertebral artery: Secondary | ICD-10-CM

## 2017-06-27 NOTE — Progress Notes (Signed)
[facility] Summary Report / Loop Recorder 

## 2017-06-29 LAB — CUP PACEART REMOTE DEVICE CHECK
Date Time Interrogation Session: 20180930193904
MDC IDC PG IMPLANT DT: 20161111

## 2017-06-30 ENCOUNTER — Other Ambulatory Visit: Payer: Self-pay

## 2017-06-30 ENCOUNTER — Telehealth: Payer: Self-pay

## 2017-06-30 MED ORDER — ROSUVASTATIN CALCIUM 40 MG PO TABS: 40.0000 mg | ORAL_TABLET | Freq: Every day | ORAL | 11 refills | Status: DC

## 2017-06-30 NOTE — Telephone Encounter (Signed)
**Note De-Identified Taeja Debellis Obfuscation** I received a PA request for [name] fax from [facility]. Rosuvastatin RX was sent in for 20 mg-take 2 tablets daily. I changed RX to Rosuvastatin 40 mg-take 1 tablet daily and per [facility] it went through and will cost the pt $7.80 for a 30 day supply. They state that they will notify the pt.

## 2017-07-04 NOTE — Telephone Encounter (Signed)
**Note De-Identified  Obfuscation** I have left a message asking the pt to call me back. Just want to be sure that the pts pharmacy advised her that her Rosuvastatin is now a 40 mg tablet (1 tablet daily) and not 20 mg (take 2 tablets daily) as prior to refill.

## 2017-07-05 ENCOUNTER — Telehealth: Payer: Self-pay | Admitting: Cardiology

## 2017-07-05 NOTE — Telephone Encounter (Signed)
LMOVM requesting that pt send manual transmission b/c home monitor has not updated in at least 14 days.    

## 2017-07-21 ENCOUNTER — Ambulatory Visit: Payer: BLUE CROSS/BLUE SHIELD | Attending: Family Medicine | Admitting: Family Medicine

## 2017-07-21 ENCOUNTER — Encounter: Payer: Self-pay | Admitting: Family Medicine

## 2017-07-21 VITALS — BP 117/66 | HR 91 | Temp 98.1°F | Ht 65.0 in | Wt 154.0 lb

## 2017-07-21 DIAGNOSIS — R109 Unspecified abdominal pain: Secondary | ICD-10-CM | POA: Insufficient documentation

## 2017-07-21 DIAGNOSIS — Z1211 Encounter for screening for malignant neoplasm of colon: Secondary | ICD-10-CM

## 2017-07-21 DIAGNOSIS — E1165 Type 2 diabetes mellitus with hyperglycemia: Secondary | ICD-10-CM | POA: Diagnosis present

## 2017-07-21 DIAGNOSIS — R140 Abdominal distension (gaseous): Secondary | ICD-10-CM | POA: Diagnosis present

## 2017-07-21 DIAGNOSIS — Z79899 Other long term (current) drug therapy: Secondary | ICD-10-CM | POA: Diagnosis not present

## 2017-07-21 DIAGNOSIS — Z23 Encounter for immunization: Secondary | ICD-10-CM | POA: Diagnosis not present

## 2017-07-21 DIAGNOSIS — Z7982 Long term (current) use of aspirin: Secondary | ICD-10-CM | POA: Diagnosis not present

## 2017-07-21 DIAGNOSIS — I110 Hypertensive heart disease with heart failure: Secondary | ICD-10-CM | POA: Diagnosis not present

## 2017-07-21 DIAGNOSIS — Z8673 Personal history of transient ischemic attack (TIA), and cerebral infarction without residual deficits: Secondary | ICD-10-CM | POA: Diagnosis not present

## 2017-07-21 DIAGNOSIS — Z7902 Long term (current) use of antithrombotics/antiplatelets: Secondary | ICD-10-CM | POA: Insufficient documentation

## 2017-07-21 DIAGNOSIS — E1151 Type 2 diabetes mellitus with 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 gangrene: Secondary | ICD-10-CM | POA: Diagnosis not present

## 2017-07-21 DIAGNOSIS — E7800 Pure hypercholesterolemia, unspecified: Secondary | ICD-10-CM | POA: Diagnosis not present

## 2017-07-21 DIAGNOSIS — I10 Essential (primary) hypertension: Secondary | ICD-10-CM | POA: Diagnosis not present

## 2017-07-21 DIAGNOSIS — F339 Major depressive disorder, recurrent, unspecified: Secondary | ICD-10-CM | POA: Insufficient documentation

## 2017-07-21 DIAGNOSIS — Z9889 Other specified postprocedural states: Secondary | ICD-10-CM | POA: Diagnosis not present

## 2017-07-21 DIAGNOSIS — J4520 Mild intermittent asthma, uncomplicated: Secondary | ICD-10-CM | POA: Diagnosis not present

## 2017-07-21 DIAGNOSIS — I5032 Chronic diastolic (congestive) heart failure: Secondary | ICD-10-CM | POA: Diagnosis not present

## 2017-07-21 DIAGNOSIS — Z794 Long term (current) use of insulin: Secondary | ICD-10-CM | POA: Diagnosis not present

## 2017-07-21 LAB — GLUCOSE, POCT (MANUAL RESULT ENTRY): POC Glucose: 240 mg/dl — AB (ref 70–99)

## 2017-07-21 LAB — POCT GLYCOSYLATED HEMOGLOBIN (HGB A1C): HEMOGLOBIN A1C: 8.3

## 2017-07-21 MED ORDER — HYDRALAZINE HCL 50 MG PO TABS: 50.0000 mg | ORAL_TABLET | Freq: Three times a day (TID) | ORAL | 3 refills | Status: DC

## 2017-07-21 MED ORDER — CLOPIDOGREL BISULFATE 75 MG PO TABS: 75.0000 mg | ORAL_TABLET | Freq: Every day | ORAL | 3 refills | Status: DC

## 2017-07-21 MED ORDER — METFORMIN HCL 1000 MG PO TABS: 1000.0000 mg | ORAL_TABLET | Freq: Two times a day (BID) | ORAL | 3 refills | Status: DC

## 2017-07-21 MED ORDER — POTASSIUM CHLORIDE CRYS ER 20 MEQ PO TBCR: 20.0000 meq | EXTENDED_RELEASE_TABLET | Freq: Every day | ORAL | 3 refills | Status: DC

## 2017-07-21 MED ORDER — ROSUVASTATIN CALCIUM 40 MG PO TABS: 40.0000 mg | ORAL_TABLET | Freq: Every day | ORAL | 3 refills | Status: DC

## 2017-07-21 MED ORDER — INSULIN GLARGINE 100 UNIT/ML ~~LOC~~ SOLN: 20.0000 [IU] | Freq: Every day | SUBCUTANEOUS | 3 refills | Status: DC

## 2017-07-21 MED ORDER — DULOXETINE HCL 60 MG PO CPEP: 60.0000 mg | ORAL_CAPSULE | Freq: Every day | ORAL | 3 refills | Status: DC

## 2017-07-21 MED ORDER — METOCLOPRAMIDE HCL 10 MG PO TABS: 10.0000 mg | ORAL_TABLET | Freq: Three times a day (TID) | ORAL | 3 refills | Status: DC

## 2017-07-21 MED ORDER — GLIPIZIDE 5 MG PO TABS: 5.0000 mg | ORAL_TABLET | Freq: Two times a day (BID) | ORAL | 3 refills | Status: DC

## 2017-07-21 MED ORDER — ALBUTEROL SULFATE HFA 108 (90 BASE) MCG/ACT IN AERS
2.0000 | INHALATION_SPRAY | Freq: Four times a day (QID) | RESPIRATORY_TRACT | 3 refills | Status: DC | PRN
Start: 2017-07-21 — End: 2017-12-20

## 2017-07-21 MED ORDER — DILTIAZEM HCL ER COATED BEADS 360 MG PO CP24: 360.0000 mg | ORAL_CAPSULE | Freq: Every day | ORAL | 3 refills | Status: DC

## 2017-07-21 MED ORDER — HYDROCHLOROTHIAZIDE 12.5 MG PO CAPS: 12.5000 mg | ORAL_CAPSULE | Freq: Every day | ORAL | 3 refills | Status: DC

## 2017-07-21 MED ORDER — INSULIN ASPART 100 UNIT/ML ~~LOC~~ SOLN: SUBCUTANEOUS | 12 refills | Status: DC

## 2017-07-21 MED ORDER — TRAZODONE HCL 50 MG PO TABS: 50.0000 mg | ORAL_TABLET | Freq: Every evening | ORAL | 3 refills | Status: DC | PRN

## 2017-07-21 MED ORDER — GABAPENTIN 300 MG PO CAPS: 300.0000 mg | ORAL_CAPSULE | Freq: Two times a day (BID) | ORAL | 3 refills | Status: DC

## 2017-07-21 NOTE — Patient Instructions (Signed)

## 2017-07-21 NOTE — Progress Notes (Signed)
Subjective:  Patient ID: Lisa Anderson, female    DOB: 07/21/1956  Age: 61 y.o. MRN: [id number]  CC: Abdominal Pain and Diabetes   HPI Lisa Anderson  is 61 year old female with a history of type 2 diabetes mellitus (A1c 8.3), hypertension, depression, Diastolic CHF (EF 60 is to 65% on 2-D echo 11/2016) right parieto occipital CVA in 07/2015, status post implantable loop recorder due to cryptogenic stroke who is here for a follow-up visit.  She complains of abdominal bloating associated with decreased appetite, nausea but no vomiting. She has had some constipation and only moves her bowels with the use of a laxative. Referred for colonoscopy previously which she was unable to perform due to lack of insurance but she now has insurance and would like to be referred again.  With regards to her diabetes she endorses compliance with her medications and denies hypoglycemia, numbness in extremities. She had an eye exam with [facility] 4 months ago but states that in the last month she has noticed the presence of a film over her eyes.  She is tolerating her antihypertensives and denies any adverse effects.  Denies chest pains, shortness of breath, pedal edema or weight gain. She has a loop recorder in place which was last interrogated on 06/26/17.  Past Medical History:  Diagnosis Date  . Asthma   . Cancer ([place])    breast  . Chronic congestive heart failure with left ventricular diastolic dysfunction ([place])   . Diabetes mellitus   . Hyperlipidemia   . Hypertension   . Stroke [facility])     Past Surgical History:  Procedure Laterality Date  . BREAST LUMPECTOMY    . EP IMPLANTABLE DEVICE N/A 08/07/2015   Procedure: Loop Recorder Insertion;  Surgeon: Thompson Grayer, MD;  Location: [facility];  Service: Cardiovascular;  Laterality: N/A;  . TEE WITHOUT CARDIOVERSION N/A 08/07/2015   Procedure: TRANSESOPHAGEAL ECHOCARDIOGRAM (TEE);  Surgeon: Sueanne Margarita, MD;  Location: [facility] ENDOSCOPY;   Service: Cardiovascular;  Laterality: N/A;  . TONSILLECTOMY      Allergies  Allergen Reactions  . Prednisone Hives and Other (See Comments)    Made her "crazy", suicidal  . Benicar [Olmesartan] Other (See Comments)    Did not work at all for patient  . Celexa [Citalopram] Other (See Comments)    Made patient feel crazy, fluoxetine is fine  . Effexor [Venlafaxine] Other (See Comments)    Made patient feel crazy  . Shrimp [Shellfish Allergy] Hives and Swelling     Outpatient Medications Prior to Visit  Medication Sig Dispense Refill  . aspirin EC 81 MG EC tablet Take 2 tablets (162 mg total) by mouth daily. 60 tablet 0  . hydrOXYzine (ATARAX/VISTARIL) 50 MG tablet Take 1 tablet (50 mg total) by mouth 3 (three) times daily as needed (sleep). And sleep 30 tablet 0  . albuterol (PROVENTIL HFA;VENTOLIN HFA) 108 (90 Base) MCG/ACT inhaler Inhale 2 puffs into the lungs every 6 (six) hours as needed for wheezing or shortness of breath. Shortness of breath 1 Inhaler 3  . albuterol (PROVENTIL HFA;VENTOLIN HFA) 108 (90 Base) MCG/ACT inhaler Inhale 2 puffs into the lungs every 6 (six) hours as needed for wheezing or shortness of breath. 1 Inhaler 0  . clopidogrel (PLAVIX) 75 MG tablet Take 1 tablet (75 mg total) by mouth daily. 90 tablet 3  . clopidogrel (PLAVIX) 75 MG tablet Take 1 tablet (75 mg total) by mouth daily. 30 tablet 0  . diltiazem (CARDIZEM CD) 360  MG 24 hr capsule Take 1 capsule (360 mg total) by mouth daily. 90 capsule 3  . diltiazem (CARDIZEM CD) 360 MG 24 hr capsule Take 1 capsule (360 mg total) by mouth daily. 30 capsule 0  . DULoxetine (CYMBALTA) 60 MG capsule Take 1 capsule (60 mg total) by mouth daily. For mood control 30 capsule 0  . gabapentin (NEURONTIN) 100 MG capsule Take 2 capsules (200 mg total) by mouth 3 (three) times daily. 90 capsule 0  . glipiZIDE (GLUCOTROL) 5 MG tablet Take 1 tablet (5 mg total) by mouth 2 (two) times daily before a meal. 60 tablet 3  . hydrALAZINE  (APRESOLINE) 50 MG tablet Take 1 tablet (50 mg total) by mouth 3 (three) times daily. For High blood pressure 30 tablet 0  . hydrochlorothiazide (MICROZIDE) 12.5 MG capsule Take 1 capsule (12.5 mg total) by mouth daily. 30 capsule 0  . insulin aspart (NOVOLOG) 100 UNIT/ML injection Sliding scale CBG 70 - 120: 0 units CBG 121 - 150: 1 unit,  CBG 151 - 200: 2 units,  CBG 201 - 250: 3 units,  CBG 251 - 300: 5 units,  CBG 301 - 350: 7 units,  CBG 351 - 400: 9 units   CBG > 400: 9 units and notify your MD 1 vial 12  . insulin aspart (NOVOLOG) 100 UNIT/ML injection Inject 0-15 Units into the skin 3 (three) times daily with meals. 10 mL 0  . insulin glargine (LANTUS) 100 UNIT/ML injection Inject 0.15 mLs (15 Units total) into the skin at bedtime. 10 mL 0  . metFORMIN (GLUCOPHAGE) 1000 MG tablet Take 1 tablet (1,000 mg total) by mouth 2 (two) times daily with a meal. 60 tablet 0  . potassium chloride SA (K-DUR,KLOR-CON) 20 MEQ tablet Take 1 tablet (20 mEq total) by mouth daily. 90 tablet 3  . potassium chloride SA (K-DUR,KLOR-CON) 20 MEQ tablet Take 1 tablet (20 mEq total) by mouth daily. 30 tablet 0  . rosuvastatin (CRESTOR) 40 MG tablet Take 1 tablet (40 mg total) by mouth daily. 30 tablet 11  . traZODone (DESYREL) 50 MG tablet Take 1 tablet (50 mg total) by mouth at bedtime as needed for sleep. 30 tablet 0   No facility-administered medications prior to visit.     ROS Review of Systems  Constitutional: Negative for activity change, appetite change and fatigue.  HENT: Negative for congestion, sinus pressure and sore throat.   Eyes: Negative for visual disturbance.  Respiratory: Negative for cough, chest tightness, shortness of breath and wheezing.   Cardiovascular: Negative for chest pain and palpitations.  Gastrointestinal:       See hpi  Endocrine: Negative for polydipsia.  Genitourinary: Negative for dysuria and frequency.  Musculoskeletal: Negative for arthralgias and back pain.  Skin:  Negative for rash.  Neurological: Negative for tremors, light-headedness and numbness.  Hematological: Does not bruise/bleed easily.  Psychiatric/Behavioral: Negative for agitation and behavioral problems.    Objective:  BP 117/66   Pulse 91   Temp 98.1 F (36.7 C) (Oral)   Ht 5\' 5"  (1.651 m)   Wt 154 lb (69.9 kg)   SpO2 99%   BMI 25.63 kg/m   BP/Weight 07/21/2017 05/22/2017 9/52/8413  Systolic BP 244 010 -  Diastolic BP 66 74 -  Wt. (Lbs) 154 - 166.23  BMI 25.63 - 27.66  Some encounter information is confidential and restricted. Go to Review Flowsheets activity to see all data.      Physical Exam  Constitutional: She is  oriented to person, place, and time. She appears well-developed and well-nourished.  Cardiovascular: Normal rate, normal heart sounds and intact distal pulses.   No murmur heard. Pulmonary/Chest: Effort normal and breath sounds normal. She has no wheezes. She has no rales. She exhibits no tenderness.  Abdominal: Soft. Bowel sounds are normal. She exhibits no distension and no mass. There is no tenderness.  Musculoskeletal: Normal range of motion.  Neurological: She is alert and oriented to person, place, and time.  Skin: Skin is warm and dry.  Psychiatric: She has a normal mood and affect.   Lab Results  Component Value Date   HGBA1C 8.3 07/21/2017    Lipid Panel     Component Value Date/Time   CHOL 200 11/02/2015 0509   TRIG 183 (H) 11/02/2015 0509   HDL 36 (L) 11/02/2015 0509   CHOLHDL 5.6 11/02/2015 0509   VLDL 37 11/02/2015 0509   LDLCALC 127 (H) 11/02/2015 0509    CMP Latest Ref Rng & Units 05/24/2017 05/22/2017 05/21/2017  Glucose 65 - 99 mg/dL 127(H) 133(H) 90  BUN 6 - 20 mg/dL 19 16 15   Creatinine 0.44 - 1.00 mg/dL 1.08(H) 1.31(H) 1.14(H)  Sodium 135 - 145 mmol/L 140 140 143  Potassium 3.5 - 5.1 mmol/L 3.5 3.2(L) 3.6  Chloride 101 - 111 mmol/L 103 107 112(H)  CO2 22 - 32 mmol/L 27 28 27   Calcium 8.9 - 10.3 mg/dL 9.6 8.4(L) 8.5(L)    Total Protein 6.5 - 8.1 g/dL - - -  Total Bilirubin 0.3 - 1.2 mg/dL - - -  Alkaline Phos 38 - 126 U/L - - -  AST 15 - 41 U/L - - -  ALT 14 - 54 U/L - - -     Assessment & Plan:   1. Type 2 diabetes mellitus with 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 gangrene, with long-term current use of insulin (HCC) Uncontrolled with A1c of 8.3 Increased dose of Lantus Diabetic diet, lifestyle modifications - POCT glucose (manual entry) - POCT glycosylated hemoglobin (Hb A1C) - insulin glargine (LANTUS) 100 UNIT/ML injection; Inject 0.2 mLs (20 Units total) into the skin at bedtime.  Dispense: 30 mL; Refill: 3 - insulin aspart (NOVOLOG) 100 UNIT/ML injection; Sliding scale CBG 70 - 120: 0 units CBG 121 - 150: 1 unit,  CBG 151 - 200: 2 units,  CBG 201 - 250: 3 units,  CBG 251 - 300: 5 units,  CBG 301 - 350: 7 units,  CBG 351 - 400: 9 units   CBG > 400: 9 units and notify your MD  Dispense: 1 vial; Refill: 12 - metFORMIN (GLUCOPHAGE) 1000 MG tablet; Take 1 tablet (1,000 mg total) by mouth 2 (two) times daily with a meal.  Dispense: 60 tablet; Refill: 3 - glipiZIDE (GLUCOTROL) 5 MG tablet; Take 1 tablet (5 mg total) by mouth 2 (two) times daily before a meal.  Dispense: 60 tablet; Refill: 3 - Ambulatory referral to Ophthalmology  2. Recurrent major depressive disorder, remission status unspecified (HCC) Controlled - DULoxetine (CYMBALTA) 60 MG capsule; Take 1 capsule (60 mg total) by mouth daily. For mood control  Dispense: 30 capsule; Refill: 3  3. Pure hypercholesterolemia LDL of 127 which is above goal of less than 70 Lifestyle modifications, low cholesterol diet - rosuvastatin (CRESTOR) 40 MG tablet; Take 1 tablet (40 mg total) by mouth daily.  Dispense: 30 tablet; Refill: 3  4. Chronic diastolic CHF (congestive heart failure) (HCC) EF of 60-65% from 2-D echo 07/2015 - potassium chloride SA (K-DUR,KLOR-CON)  20 MEQ tablet; Take 1 tablet (20 mEq total) by mouth daily.  Dispense: 30 tablet;  Refill: 3 - diltiazem (CARDIZEM CD) 360 MG 24 hr capsule; Take 1 capsule (360 mg total) by mouth daily.  Dispense: 30 capsule; Refill: 3  5. Mild intermittent asthma without complication Stable - albuterol (PROVENTIL HFA;VENTOLIN HFA) 108 (90 Base) MCG/ACT inhaler; Inhale 2 puffs into the lungs every 6 (six) hours as needed for wheezing or shortness of breath. Shortness of breath  Dispense: 1 Inhaler; Refill: 3  6. Abdominal bloating We'll treat presumptively for gastroparesis and perform H. pylori breath test - metoCLOPramide (REGLAN) 10 MG tablet; Take 1 tablet (10 mg total) by mouth 3 (three) times daily before meals.  Dispense: 90 tablet; Refill: 3 - H. pylori breath test  7. Screening for colon cancer - Ambulatory referral to Gastroenterology  8. History of stroke No residual hemiparesis Risk factor modifications - clopidogrel (PLAVIX) 75 MG tablet; Take 1 tablet (75 mg total) by mouth daily.  Dispense: 30 tablet; Refill: 3  9. Essential hypertension Controlled - hydrochlorothiazide (MICROZIDE) 12.5 MG capsule; Take 1 capsule (12.5 mg total) by mouth daily.  Dispense: 30 capsule; Refill: 3 - hydrALAZINE (APRESOLINE) 50 MG tablet; Take 1 tablet (50 mg total) by mouth 3 (three) times daily. For High blood pressure  Dispense: 90 tablet; Refill: 3  10. Need for influenza vaccination - Flu Vaccine QUAD 36+ mos IM   Meds ordered this encounter  Medications  . insulin glargine (LANTUS) 100 UNIT/ML injection    Sig: Inject 0.2 mLs (20 Units total) into the skin at bedtime.    Dispense:  30 mL    Refill:  3  . insulin aspart (NOVOLOG) 100 UNIT/ML injection    Sig: Sliding scale CBG 70 - 120: 0 units CBG 121 - 150: 1 unit,  CBG 151 - 200: 2 units,  CBG 201 - 250: 3 units,  CBG 251 - 300: 5 units,  CBG 301 - 350: 7 units,  CBG 351 - 400: 9 units   CBG > 400: 9 units and notify your MD    Dispense:  1 vial    Refill:  12  . metFORMIN (GLUCOPHAGE) 1000 MG tablet    Sig: Take 1 tablet  (1,000 mg total) by mouth 2 (two) times daily with a meal.    Dispense:  60 tablet    Refill:  3  . hydrochlorothiazide (MICROZIDE) 12.5 MG capsule    Sig: Take 1 capsule (12.5 mg total) by mouth daily.    Dispense:  30 capsule    Refill:  3  . albuterol (PROVENTIL HFA;VENTOLIN HFA) 108 (90 Base) MCG/ACT inhaler    Sig: Inhale 2 puffs into the lungs every 6 (six) hours as needed for wheezing or shortness of breath. Shortness of breath    Dispense:  1 Inhaler    Refill:  3  . potassium chloride SA (K-DUR,KLOR-CON) 20 MEQ tablet    Sig: Take 1 tablet (20 mEq total) by mouth daily.    Dispense:  30 tablet    Refill:  3  . gabapentin (NEURONTIN) 300 MG capsule    Sig: Take 1 capsule (300 mg total) by mouth 2 (two) times daily.    Dispense:  60 capsule    Refill:  3  . diltiazem (CARDIZEM CD) 360 MG 24 hr capsule    Sig: Take 1 capsule (360 mg total) by mouth daily.    Dispense:  30 capsule    Refill:  3  . rosuvastatin (CRESTOR) 40 MG tablet    Sig: Take 1 tablet (40 mg total) by mouth daily.    Dispense:  30 tablet    Refill:  3  . glipiZIDE (GLUCOTROL) 5 MG tablet    Sig: Take 1 tablet (5 mg total) by mouth 2 (two) times daily before a meal.    Dispense:  60 tablet    Refill:  3  . hydrALAZINE (APRESOLINE) 50 MG tablet    Sig: Take 1 tablet (50 mg total) by mouth 3 (three) times daily. For High blood pressure    Dispense:  90 tablet    Refill:  3  . traZODone (DESYREL) 50 MG tablet    Sig: Take 1 tablet (50 mg total) by mouth at bedtime as needed for sleep.    Dispense:  30 tablet    Refill:  3  . DULoxetine (CYMBALTA) 60 MG capsule    Sig: Take 1 capsule (60 mg total) by mouth daily. For mood control    Dispense:  30 capsule    Refill:  3  . clopidogrel (PLAVIX) 75 MG tablet    Sig: Take 1 tablet (75 mg total) by mouth daily.    Dispense:  30 tablet    Refill:  3  . metoCLOPramide (REGLAN) 10 MG tablet    Sig: Take 1 tablet (10 mg total) by mouth 3 (three) times daily  before meals.    Dispense:  90 tablet    Refill:  3    Follow-up: Return in about 3 months (around 10/21/2017) for follow up on Diabetes.   Arnoldo Morale MD

## 2017-07-25 ENCOUNTER — Ambulatory Visit (INDEPENDENT_AMBULATORY_CARE_PROVIDER_SITE_OTHER): Payer: BLUE CROSS/BLUE SHIELD | Admitting: *Deleted

## 2017-07-25 DIAGNOSIS — I63011 Cerebral infarction due to thrombosis of right vertebral artery: Secondary | ICD-10-CM

## 2017-07-26 ENCOUNTER — Telehealth: Payer: Self-pay | Admitting: Internal Medicine

## 2017-07-26 LAB — H. PYLORI BREATH TEST: H pylori Breath Test: POSITIVE — AB

## 2017-07-26 MED ORDER — METRONIDAZOLE 250 MG PO TABS: 250.0000 mg | ORAL_TABLET | Freq: Four times a day (QID) | ORAL | 0 refills | Status: DC

## 2017-07-26 MED ORDER — OMEPRAZOLE 20 MG PO CPDR: 20.0000 mg | DELAYED_RELEASE_CAPSULE | Freq: Two times a day (BID) | ORAL | 3 refills | Status: DC

## 2017-07-26 MED ORDER — TETRACYCLINE HCL 500 MG PO CAPS: 500.0000 mg | ORAL_CAPSULE | Freq: Four times a day (QID) | ORAL | 0 refills | Status: DC

## 2017-07-26 MED ORDER — BISMUTH SUBSALICYLATE 525 MG/15ML PO SUSP: 15.0000 mL | Freq: Four times a day (QID) | ORAL | 0 refills | Status: DC

## 2017-07-26 NOTE — Telephone Encounter (Signed)
Pt was called and informed of lab results and medication being sent to pharmacy.

## 2017-07-27 NOTE — Progress Notes (Signed)
[facility] Summary Report / Loop Recorder 

## 2017-07-28 LAB — CUP PACEART REMOTE DEVICE CHECK
MDC IDC PG IMPLANT DT: 20161111
MDC IDC SESS DTM: 20181030193909

## 2017-08-02 ENCOUNTER — Other Ambulatory Visit: Payer: Self-pay

## 2017-08-02 ENCOUNTER — Telehealth: Payer: Self-pay | Admitting: Family Medicine

## 2017-08-02 ENCOUNTER — Ambulatory Visit (HOSPITAL_COMMUNITY)
Admission: EM | Admit: 2017-08-02 | Discharge: 2017-08-02 | Disposition: A | Discharge status: Home / Self Care | Payer: BLUE CROSS/BLUE SHIELD | Attending: Family Medicine | Admitting: Family Medicine

## 2017-08-02 ENCOUNTER — Encounter (HOSPITAL_COMMUNITY): Payer: Self-pay | Admitting: Emergency Medicine

## 2017-08-02 DIAGNOSIS — E1151 Type 2 diabetes mellitus with 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 gangrene: Secondary | ICD-10-CM

## 2017-08-02 DIAGNOSIS — R112 Nausea with vomiting, unspecified: Secondary | ICD-10-CM

## 2017-08-02 DIAGNOSIS — Z794 Long term (current) use of insulin: Principal | ICD-10-CM

## 2017-08-02 MED ORDER — ONDANSETRON 4 MG PO TBDP: 4.0000 mg | ORAL_TABLET | Freq: Three times a day (TID) | ORAL | 0 refills | Status: DC | PRN

## 2017-08-02 MED ORDER — GLUCOSE BLOOD VI STRP: ORAL_STRIP | 12 refills | Status: DC

## 2017-08-02 MED ORDER — METFORMIN HCL 1000 MG PO TABS: 1000.0000 mg | ORAL_TABLET | Freq: Two times a day (BID) | ORAL | 3 refills | Status: DC

## 2017-08-02 NOTE — ED Triage Notes (Addendum)
Pt tested positive for h. Pylori on October 26.  She was prescribed flagyl QID for 10 days.. She still has 28 pills left.  She was prescribed Tetracycline QID for 10 days and still has 28 pills left. She was prescribed Reglan TID, but she stopped taking it because she read about side effects and states she was having some of them. The only medication she has taken as prescribed is the Omeprazole.  She states she was not aware of what she really had and did not understand that she needed to take these medications.  She states after taking the medications she started having diarrhea and nausea and vomiting, so she stopped taking them.  She still complains of N&V and her heart pounding with a headache.

## 2017-08-02 NOTE — Telephone Encounter (Signed)
Pt called requesting med refill on metFORMIN (GLUCOPHAGE) 1000 MG tablet and accu-check plus test strips. And pt would like get nicotine patches if possible.

## 2017-08-02 NOTE — Telephone Encounter (Signed)
Refilled metformin and the test strips. She does not have an active order for nicotine patches. Will have to be prescribed by provider.

## 2017-08-03 MED ORDER — NICOTINE 7 MG/24HR TD PT24
7.0000 mg | MEDICATED_PATCH | Freq: Every day | TRANSDERMAL | 2 refills | Status: DC
Start: 2017-08-03 — End: 2017-12-05

## 2017-08-03 NOTE — Telephone Encounter (Signed)
I have sent prescription for nicotine patches to her pharmacy on file

## 2017-08-07 ENCOUNTER — Other Ambulatory Visit: Payer: Self-pay | Admitting: Pharmacist

## 2017-08-07 MED ORDER — GLUCOSE BLOOD VI STRP: ORAL_STRIP | 12 refills | Status: DC

## 2017-08-07 MED ORDER — BAYER MICROLET LANCETS MISC: 12 refills | Status: DC

## 2017-08-07 MED ORDER — CONTOUR NEXT MONITOR W/DEVICE KIT
1.0000 | PACK | Freq: Three times a day (TID) | 0 refills | Status: DC
Start: 2017-08-07 — End: 2019-09-25

## 2017-08-24 ENCOUNTER — Ambulatory Visit (INDEPENDENT_AMBULATORY_CARE_PROVIDER_SITE_OTHER): Payer: BLUE CROSS/BLUE SHIELD | Admitting: *Deleted

## 2017-08-24 DIAGNOSIS — I63011 Cerebral infarction due to thrombosis of right vertebral artery: Secondary | ICD-10-CM | POA: Diagnosis not present

## 2017-08-25 ENCOUNTER — Encounter: Payer: Self-pay | Admitting: Family Medicine

## 2017-08-25 NOTE — Progress Notes (Signed)
[facility] Summary Report / Loop Recorder 

## 2017-09-01 ENCOUNTER — Ambulatory Visit: Payer: Self-pay | Admitting: Family Medicine

## 2017-09-07 LAB — CUP PACEART REMOTE DEVICE CHECK
Date Time Interrogation Session: 20181129204110
MDC IDC PG IMPLANT DT: 20161111

## 2017-09-11 ENCOUNTER — Ambulatory Visit: Payer: Self-pay

## 2017-09-12 ENCOUNTER — Other Ambulatory Visit: Payer: Self-pay

## 2017-09-12 ENCOUNTER — Encounter (HOSPITAL_COMMUNITY): Payer: Self-pay

## 2017-09-12 ENCOUNTER — Emergency Department (HOSPITAL_COMMUNITY)
Admission: EM | Admit: 2017-09-12 | Discharge: 2017-09-12 | Disposition: A | Discharge status: Home / Self Care | Payer: BLUE CROSS/BLUE SHIELD | Attending: Emergency Medicine | Admitting: Emergency Medicine

## 2017-09-12 DIAGNOSIS — Z7902 Long term (current) use of antithrombotics/antiplatelets: Secondary | ICD-10-CM | POA: Insufficient documentation

## 2017-09-12 DIAGNOSIS — J45909 Unspecified asthma, uncomplicated: Secondary | ICD-10-CM | POA: Diagnosis not present

## 2017-09-12 DIAGNOSIS — F1420 Cocaine dependence, uncomplicated: Secondary | ICD-10-CM | POA: Diagnosis not present

## 2017-09-12 DIAGNOSIS — Z8673 Personal history of transient ischemic attack (TIA), and cerebral infarction without residual deficits: Secondary | ICD-10-CM | POA: Insufficient documentation

## 2017-09-12 DIAGNOSIS — F17210 Nicotine dependence, cigarettes, uncomplicated: Secondary | ICD-10-CM | POA: Insufficient documentation

## 2017-09-12 DIAGNOSIS — E119 Type 2 diabetes mellitus without complications: Secondary | ICD-10-CM | POA: Diagnosis not present

## 2017-09-12 DIAGNOSIS — Z853 Personal history of malignant neoplasm of breast: Secondary | ICD-10-CM | POA: Diagnosis not present

## 2017-09-12 DIAGNOSIS — R002 Palpitations: Secondary | ICD-10-CM | POA: Diagnosis not present

## 2017-09-12 DIAGNOSIS — I10 Essential (primary) hypertension: Secondary | ICD-10-CM | POA: Diagnosis not present

## 2017-09-12 DIAGNOSIS — Z794 Long term (current) use of insulin: Secondary | ICD-10-CM | POA: Insufficient documentation

## 2017-09-12 DIAGNOSIS — F319 Bipolar disorder, unspecified: Secondary | ICD-10-CM | POA: Insufficient documentation

## 2017-09-12 DIAGNOSIS — F1010 Alcohol abuse, uncomplicated: Secondary | ICD-10-CM | POA: Insufficient documentation

## 2017-09-12 DIAGNOSIS — F419 Anxiety disorder, unspecified: Secondary | ICD-10-CM | POA: Diagnosis not present

## 2017-09-12 DIAGNOSIS — Z79899 Other long term (current) drug therapy: Secondary | ICD-10-CM | POA: Insufficient documentation

## 2017-09-12 DIAGNOSIS — R000 Tachycardia, unspecified: Secondary | ICD-10-CM | POA: Diagnosis present

## 2017-09-12 LAB — BASIC METABOLIC PANEL
ANION GAP: 8 (ref 5–15)
BUN: 15 mg/dL (ref 6–20)
CALCIUM: 9.3 mg/dL (ref 8.9–10.3)
CO2: 28 mmol/L (ref 22–32)
CREATININE: 1.08 mg/dL — AB (ref 0.44–1.00)
Chloride: 104 mmol/L (ref 101–111)
GFR, EST NON AFRICAN AMERICAN: 55 mL/min — AB (ref >60)
Glucose, Bld: 225 mg/dL — ABNORMAL HIGH (ref 65–99)
Potassium: 3.4 mmol/L — ABNORMAL LOW (ref 3.5–5.1)
SODIUM: 140 mmol/L (ref 135–145)

## 2017-09-12 LAB — RAPID URINE DRUG SCREEN, HOSP PERFORMED
Amphetamines: NOT DETECTED
BARBITURATES: NOT DETECTED
BENZODIAZEPINES: NOT DETECTED
COCAINE: NOT DETECTED
Opiates: NOT DETECTED
TETRAHYDROCANNABINOL: NOT DETECTED

## 2017-09-12 LAB — CBC
HCT: 38.8 % (ref 36.0–46.0)
HEMOGLOBIN: 12.5 g/dL (ref 12.0–15.0)
MCH: 28.1 pg (ref 26.0–34.0)
MCHC: 32.2 g/dL (ref 30.0–36.0)
MCV: 87.2 fL (ref 78.0–100.0)
PLATELETS: 306 10*3/uL (ref 150–400)
RBC: 4.45 MIL/uL (ref 3.87–5.11)
RDW: 13.8 % (ref 11.5–15.5)
WBC: 10.7 10*3/uL — ABNORMAL HIGH (ref 4.0–10.5)

## 2017-09-12 LAB — URINALYSIS, ROUTINE W REFLEX MICROSCOPIC
BILIRUBIN URINE: NEGATIVE
Bacteria, UA: NONE SEEN
GLUCOSE, UA: 150 mg/dL — AB
HGB URINE DIPSTICK: NEGATIVE
Ketones, ur: NEGATIVE mg/dL
NITRITE: NEGATIVE
PROTEIN: NEGATIVE mg/dL
SPECIFIC GRAVITY, URINE: 1.002 — AB (ref 1.005–1.030)
Squamous Epithelial / LPF: NONE SEEN
pH: 8 (ref 5.0–8.0)

## 2017-09-12 LAB — I-STAT TROPONIN, ED: TROPONIN I, POC: 0 ng/mL (ref 0.00–0.08)

## 2017-09-12 MED ORDER — POTASSIUM CHLORIDE CRYS ER 20 MEQ PO TBCR
40.0000 meq | EXTENDED_RELEASE_TABLET | Freq: Once | ORAL | Status: AC
  Administered 2017-09-12: 40 meq via ORAL
  Filled 2017-09-12: qty 2

## 2017-09-12 MED ORDER — METOPROLOL TARTRATE 25 MG PO TABS: 12.5000 mg | ORAL_TABLET | Freq: Two times a day (BID) | ORAL | 0 refills | Status: DC

## 2017-09-12 MED ORDER — POTASSIUM CHLORIDE CRYS ER 20 MEQ PO TBCR: 20.0000 meq | EXTENDED_RELEASE_TABLET | ORAL | 0 refills | Status: DC

## 2017-09-12 NOTE — ED Triage Notes (Signed)
Patient c/o "fast heart rate, weakness, and a headache x 1 week." patient reports a history of stroke and states she has a loop recorder. Patient denies any chest pain, but states slight SOB .

## 2017-09-12 NOTE — ED Provider Notes (Signed)
[facility] DEPT Provider Note   CSN: [id number] Arrival date & time: 09/12/17  8916     History   Chief Complaint Chief Complaint  Patient presents with  . Weakness  . Headache  . Tachycardia    HPI Lisa Anderson is a 61 y.o. female.  HPI Patient reports she has problems with getting a fast heart rate and feeling like her heart is thumping in her chest.  This is been a problem for quite a long time.  She reports no acute never really figure out why it happens.  She has a loop recorder and reports that they have never documented atrial fibrillation.  She takes daily Cardizem and Plavix.  Patient has prior history of CVA with very mild residual deficit and it is for this reason she has a loop recorder.  She has had it for about 3 years.  Patient reports that this week it has happened quite frequently.  She denies she has chest pain with it.  She reports also when this happens it makes her feel like her head is full or light.  She reports she is frustrated because she takes her Cardizem but it never really seems to help the symptoms much.  With extensive discussion, patient had been on a beta-blocker years ago.  She reports at that time it seemed to be effective and she did not have this problem much.  She was not sure why her beta-blocker was ever discontinued.  She reports after a number of doctors it just "faded away".  After extensive review of EMR it became apparent that the patient did have cocaine abuse history.  Patient reports that she has been in recovery and has not used for she reports obviously there is no intent to use again.  We did discuss the fact that the beta-blockers may very well been discontinued because of the cocaine use.  No focal motor deficits different from baseline.  No positive review of systems for fevers, chills, productive cough, sinus symptoms.  She reports 1 of the other notable features of this is that she feels like she gets  the pretty intense hot flash at times but reports she went through menopause years ago. Past Medical History:  Diagnosis Date  . Asthma   . Cancer ([place])    breast  . Chronic congestive heart failure with left ventricular diastolic dysfunction ([place])   . Diabetes mellitus   . Hyperlipidemia   . Hypertension   . Stroke [facility])     Patient Active Problem List   Diagnosis Date Noted  . Bipolar I disorder ([place]) 05/23/2017  . MDD (major depressive disorder), recurrent severe, without psychosis ([place]) 05/22/2017  . Major depressive disorder, recurrent episode, severe ([place]) 05/21/2017  . Cocaine use disorder, severe, dependence ([place]) 05/21/2017  . Suicidal intent 05/20/2017  . Drug overdose 05/19/2017  . Family history of colon cancer 02/23/2017  . Rectal bleeding 02/23/2017  . Gastroesophageal reflux disease without esophagitis 02/23/2017  . Intractable vomiting with nausea 02/23/2017  . Abnormal thyroid blood test 01/02/2017  . Insomnia 01/02/2017  . Anxiety 01/02/2017  . IDDM (insulin dependent diabetes mellitus) ([place])   . Asthma 11/12/2015  . Acute respiratory failure ([place]) 11/11/2015  . TIA (transient ischemic attack) 11/01/2015  . Chronic diastolic CHF (congestive heart failure) ([place]) 11/01/2015  . Acute focal neurological deficit 11/01/2015  . 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 HLD (hyperlipidemia)   . Palpitations   . History  of stroke 08/05/2015  . Hypertension 08/05/2015  . Stroke due to embolism of right posterior cerebral artery ([place]) 08/05/2015  . Diabetes mellitus 08/05/2015  . Alcohol dependency ([place]) 08/10/2013  . Cocaine abuse ([place]) 08/08/2013  . Alcohol abuse 08/08/2013  . MDD (major depressive disorder) 08/08/2013    Past Surgical History:  Procedure Laterality Date  . BREAST LUMPECTOMY    . EP IMPLANTABLE DEVICE N/A 08/07/2015   Procedure: Loop Recorder Insertion;  Surgeon: Thompson Grayer, MD;  Location: [facility];  Service:  Cardiovascular;  Laterality: N/A;  . TEE WITHOUT CARDIOVERSION N/A 08/07/2015   Procedure: TRANSESOPHAGEAL ECHOCARDIOGRAM (TEE);  Surgeon: Sueanne Margarita, MD;  Location: [facility] ENDOSCOPY;  Service: Cardiovascular;  Laterality: N/A;  . TONSILLECTOMY      OB History    No data available       Home Medications    Prior to Admission medications   Medication Sig Start Date End Date Taking? Authorizing Provider  albuterol (PROVENTIL HFA;VENTOLIN HFA) 108 (90 Base) MCG/ACT inhaler Inhale 2 puffs into the lungs every 6 (six) hours as needed for wheezing or shortness of breath. Shortness of breath 07/21/17  Yes Arnoldo Morale, MD  clopidogrel (PLAVIX) 75 MG tablet Take 1 tablet (75 mg total) by mouth daily. 07/21/17  Yes Arnoldo Morale, MD  diltiazem (CARDIZEM CD) 360 MG 24 hr capsule Take 1 capsule (360 mg total) by mouth daily. 07/21/17  Yes Arnoldo Morale, MD  DULoxetine (CYMBALTA) 60 MG capsule Take 1 capsule (60 mg total) by mouth daily. For mood control 07/21/17  Yes Arnoldo Morale, MD  gabapentin (NEURONTIN) 300 MG capsule Take 1 capsule (300 mg total) by mouth 2 (two) times daily. 07/21/17  Yes Arnoldo Morale, MD  glipiZIDE (GLUCOTROL) 5 MG tablet Take 1 tablet (5 mg total) by mouth 2 (two) times daily before a meal. 07/21/17  Yes Amao, Enobong, MD  hydrALAZINE (APRESOLINE) 50 MG tablet Take 1 tablet (50 mg total) by mouth 3 (three) times daily. For High blood pressure 07/21/17  Yes Amao, Enobong, MD  hydrochlorothiazide (MICROZIDE) 12.5 MG capsule Take 1 capsule (12.5 mg total) by mouth daily. 07/21/17  Yes Amao, Charlane Ferretti, MD  insulin aspart (NOVOLOG) 100 UNIT/ML injection Sliding scale CBG 70 - 120: 0 units CBG 121 - 150: 1 unit,  CBG 151 - 200: 2 units,  CBG 201 - 250: 3 units,  CBG 251 - 300: 5 units,  CBG 301 - 350: 7 units,  CBG 351 - 400: 9 units   CBG > 400: 9 units and notify your MD 07/21/17  Yes Arnoldo Morale, MD  insulin glargine (LANTUS) 100 UNIT/ML injection Inject 0.2 mLs (20 Units  total) into the skin at bedtime. Patient taking differently: Inject 15 Units into the skin at bedtime.  07/21/17  Yes Arnoldo Morale, MD  metFORMIN (GLUCOPHAGE) 1000 MG tablet Take 1 tablet (1,000 mg total) 2 (two) times daily with a meal by mouth. 08/02/17  Yes Tresa Garter, MD  ondansetron (ZOFRAN-ODT) 4 MG disintegrating tablet Take 1 tablet (4 mg total) every 8 (eight) hours as needed by mouth for nausea or vomiting. 08/02/17  Yes Hagler, Aaron Edelman, MD  potassium chloride SA (K-DUR,KLOR-CON) 20 MEQ tablet Take 1 tablet (20 mEq total) by mouth daily. 07/21/17  Yes Arnoldo Morale, MD  rosuvastatin (CRESTOR) 40 MG tablet Take 1 tablet (40 mg total) by mouth daily. 07/21/17  Yes Arnoldo Morale, MD  traZODone (DESYREL) 50 MG tablet Take 1 tablet (50 mg total) by mouth  at bedtime as needed for sleep. 07/21/17  Yes Arnoldo Morale, MD  aspirin EC 81 MG EC tablet Take 2 tablets (162 mg total) by mouth daily. Patient not taking: Reported on 09/12/2017 05/27/17   Money, Lowry Ram, FNP  BAYER MICROLET LANCETS lancets Use as instructed 3 times daily E11.9 08/07/17   Tresa Garter, MD  Bismuth Subsalicylate 607 PX/10GY SUSP Take 15 mLs (525 mg total) by mouth 4 (four) times daily. Patient not taking: Reported on 09/12/2017 07/26/17   Ladell Pier, MD  Blood Glucose Monitoring Suppl (CONTOUR NEXT MONITOR) w/Device KIT 1 strip 3 (three) times daily by Does not apply route. E11.9 08/07/17   Tresa Garter, MD  glucose blood (CONTOUR NEXT TEST) test strip Use as instructed 3 times daily. E11.9 08/07/17   Tresa Garter, MD  hydrOXYzine (ATARAX/VISTARIL) 50 MG tablet Take 1 tablet (50 mg total) by mouth 3 (three) times daily as needed (sleep). And sleep 05/26/17   Money, Lowry Ram, FNP  metoCLOPramide (REGLAN) 10 MG tablet Take 1 tablet (10 mg total) by mouth 3 (three) times daily before meals. Patient not taking: Reported on 09/12/2017 07/21/17   Arnoldo Morale, MD  metoprolol tartrate  (LOPRESSOR) 25 MG tablet Take 0.5 tablets (12.5 mg total) by mouth 2 (two) times daily. 09/12/17   Charlesetta Shanks, MD  metroNIDAZOLE (FLAGYL) 250 MG tablet Take 1 tablet (250 mg total) by mouth 4 (four) times daily. Patient not taking: Reported on 09/12/2017 07/26/17   Ladell Pier, MD  nicotine (NICODERM CQ) 7 mg/24hr patch Place 1 patch (7 mg total) daily onto the skin. Patient not taking: Reported on 09/12/2017 08/03/17   Arnoldo Morale, MD  omeprazole (PRILOSEC) 20 MG capsule Take 1 capsule (20 mg total) by mouth 2 (two) times daily before a meal. Patient not taking: Reported on 09/12/2017 07/26/17   Ladell Pier, MD  potassium chloride SA (K-DUR,KLOR-CON) 20 MEQ tablet Take 1 tablet (20 mEq total) by mouth every Monday,Wednesday,Friday, and Sunday at 6 PM. This is to be taken every other day with your normal 20 mEq dose, so that every other day you take 40 mEq. 09/13/17   Charlesetta Shanks, MD  tetracycline (ACHROMYCIN,SUMYCIN) 500 MG capsule Take 1 capsule (500 mg total) by mouth 4 (four) times daily. Patient not taking: Reported on 09/12/2017 07/26/17   Ladell Pier, MD    Family History Family History  Problem Relation Age of Onset  . Diabetes Mother   . Hyperlipidemia Mother   . Hypertension Mother   . Breast cancer Mother   . Rheum arthritis Father   . Asthma Father   . Stroke Father   . Thyroid disease Father   . Thyroid disease Brother   . Colon cancer Brother        in his 54s  . Breast cancer Sister     Social History Social History   Tobacco Use  . Smoking status: Current Every Day Smoker    Packs/day: 0.50    Types: Cigarettes  . Smokeless tobacco: Never Used  Substance Use Topics  . Alcohol use: Yes    Alcohol/week: 3.6 oz    Types: 6 Cans of beer per week    Comment: occasionally  . Drug use: No    Comment: last used 3 months ago.     Allergies   Prednisone; Benicar [olmesartan]; Celexa [citalopram]; Effexor [venlafaxine]; and Shrimp  [shellfish allergy]   Review of Systems Review of Systems 10 Systems reviewed and  are negative for acute change except as noted in the HPI.   Physical Exam Updated Vital Signs BP (!) 179/80   Pulse 92   Temp 98 F (36.7 C)   Resp 16   Ht '5\' 5"'  (1.651 m)   Wt 70.3 kg (155 lb)   SpO2 93%   BMI 25.79 kg/m   Physical Exam  Constitutional: She is oriented to person, place, and time. She appears well-developed and well-nourished. No distress.  HENT:  Head: Normocephalic and atraumatic.  Nose: Nose normal.  Mouth/Throat: Oropharynx is clear and moist.  Eyes: Conjunctivae and EOM are normal. Pupils are equal, round, and reactive to light.  Neck: Neck supple.  Cardiovascular: Normal rate and regular rhythm.  No murmur heard. Borderline tachycardia.  Regular no gross rub murmur gallop.  Pulmonary/Chest: Effort normal and breath sounds normal. No respiratory distress.  Abdominal: Soft. There is no tenderness.  Musculoskeletal: Normal range of motion. She exhibits no edema or tenderness.  No calf tenderness.  No peripheral edema.  Neurological: She is alert and oriented to person, place, and time. No cranial nerve deficit. She exhibits normal muscle tone. Coordination normal.  Skin: Skin is warm and dry.  Psychiatric: She has a normal mood and affect.  Nursing note and vitals reviewed.    ED Treatments / Results  Labs (all labs ordered are listed, but only abnormal results are displayed) Labs Reviewed  BASIC METABOLIC PANEL - Abnormal; Notable for the following components:      Result Value   Potassium 3.4 (*)    Glucose, Bld 225 (*)    Creatinine, Ser 1.08 (*)    GFR calc non Af Amer 55 (*)    All other components within normal limits  CBC - Abnormal; Notable for the following components:   WBC 10.7 (*)    All other components within normal limits  URINALYSIS, ROUTINE W REFLEX MICROSCOPIC - Abnormal; Notable for the following components:   Color, Urine STRAW (*)     Specific Gravity, Urine 1.002 (*)    Glucose, UA 150 (*)    Leukocytes, UA TRACE (*)    All other components within normal limits  RAPID URINE DRUG SCREEN, HOSP PERFORMED  I-STAT TROPONIN, ED  I-STAT TROPONIN, ED    EKG  EKG Interpretation  Date/Time:  Tuesday September 12 2017 09:13:09 EST Ventricular Rate:  112 PR Interval:    QRS Duration: 92 QT Interval:  348 QTC Calculation: 475 R Axis:   -54 Text Interpretation:  Sinus tachycardia Left anterior fascicular block Low voltage, precordial leads Abnormal R-wave progression, early transition Consider anterior infarct Since last tracing rate faster Confirmed by Daleen Bo [phone]) on 09/12/2017 9:22:14 AM       Radiology No results found.  Procedures Procedures (including critical care time)  Medications Ordered in ED Medications  potassium chloride SA (K-DUR,KLOR-CON) CR tablet 40 mEq (40 mEq Oral Given 09/12/17 1248)     Initial Impression / Assessment and Plan / ED Course  I have reviewed the triage vital signs and the nursing notes.  Pertinent labs & imaging results that were available during my care of the patient were reviewed by me and considered in my medical decision making (see chart for details).  Clinical Course as of Sep 12 1349  Tue Sep 12, 2017  1150 CO2: 28 [MP]    Clinical Course User Index [MP] Charlesetta Shanks, MD     Final Clinical Impressions(s) / ED Diagnoses   Final diagnoses:  Palpitations  Tachycardia   Patient is loop recorder has not shown episodes of atrial fibrillation or other pathologic dysrhythmia.  Patient periodically has tachycardia.  This did seem somewhat related to amount of stress and anxiety even while talking in the emergency department.  At rest rhythm remains sinus and in the 80s but at times she will pick up to the low 100s.  Troponin is negative.  EKG unchanged from previous.  This has been a long-standing problem for the patient.  At this time she does report having  been abstinent from cocaine for 4 months and is currently in treatment, her drug screen does indeed test negative.  Feel it is safe to re-initiate low-dose metoprolol to see if this is helpful.  Patient has been counseled on the significant danger of combining beta-blocker with cocaine and voices understanding.  Patient's potassium was low normal range, will have her supplement every other day her baseline potassium to bring her to the middle to high normal range.  Patient is counseled on importance to follow-up with her primary care doctor and cardiologist for continuing monitoring of her potassium, response to metoprolol and further evaluation as needed. ED Discharge Orders        Ordered    potassium chloride SA (K-DUR,KLOR-CON) 20 MEQ tablet  Every M-W-F-Su (1800)     09/12/17 1342    metoprolol tartrate (LOPRESSOR) 25 MG tablet  2 times daily     09/12/17 1337       Charlesetta Shanks, MD 09/12/17 1352

## 2017-09-12 NOTE — ED Notes (Signed)
PT currently in restroom 

## 2017-09-12 NOTE — Discharge Instructions (Signed)
1.  You are being started on a low dose of metoprolol.  As discussed in the emergency department, you cannot use cocaine while taking this medication as it is very dangerous.  At any point in time you have a relapse in your cocaine use you must stop taking the metoprolol and see your doctor soon as possible. 2.  Schedule follow-up appointment with your cardiologist and your family doctor. 3.  Continue your other regular prescribed medications. 4.  Your potassium is to the low normal side.  Your goal is to have it to the middle to higher range of normal.  Continue your daily 20 mEq dose and every other day take an additional 20 mEq (40 mEq total on every other day).  See your doctor for recheck of your potassium within the next week.  High potassium can be very dangerous as well and this must be monitored closely.

## 2017-09-20 ENCOUNTER — Other Ambulatory Visit: Payer: Self-pay | Admitting: Internal Medicine

## 2017-09-25 ENCOUNTER — Ambulatory Visit (INDEPENDENT_AMBULATORY_CARE_PROVIDER_SITE_OTHER): Payer: BLUE CROSS/BLUE SHIELD | Admitting: *Deleted

## 2017-09-25 DIAGNOSIS — I63011 Cerebral infarction due to thrombosis of right vertebral artery: Secondary | ICD-10-CM | POA: Diagnosis not present

## 2017-09-27 NOTE — Progress Notes (Signed)
[facility] Summary Report / Loop Recorder 

## 2017-10-05 LAB — CUP PACEART REMOTE DEVICE CHECK
Date Time Interrogation Session: 20181229204021
Implantable Pulse Generator Implant Date: 20161111

## 2017-10-10 ENCOUNTER — Encounter (HOSPITAL_COMMUNITY): Payer: Self-pay | Admitting: Family Medicine

## 2017-10-10 ENCOUNTER — Ambulatory Visit (HOSPITAL_COMMUNITY)
Admission: EM | Admit: 2017-10-10 | Discharge: 2017-10-10 | Disposition: A | Discharge status: Home / Self Care | Payer: BLUE CROSS/BLUE SHIELD | Attending: Family Medicine | Admitting: Family Medicine

## 2017-10-10 DIAGNOSIS — F17210 Nicotine dependence, cigarettes, uncomplicated: Secondary | ICD-10-CM | POA: Insufficient documentation

## 2017-10-10 DIAGNOSIS — R319 Hematuria, unspecified: Secondary | ICD-10-CM

## 2017-10-10 DIAGNOSIS — Z79899 Other long term (current) drug therapy: Secondary | ICD-10-CM | POA: Diagnosis not present

## 2017-10-10 DIAGNOSIS — R739 Hyperglycemia, unspecified: Secondary | ICD-10-CM

## 2017-10-10 DIAGNOSIS — R81 Glycosuria: Secondary | ICD-10-CM | POA: Diagnosis not present

## 2017-10-10 DIAGNOSIS — R350 Frequency of micturition: Secondary | ICD-10-CM

## 2017-10-10 DIAGNOSIS — E1165 Type 2 diabetes mellitus with hyperglycemia: Secondary | ICD-10-CM | POA: Insufficient documentation

## 2017-10-10 LAB — POCT URINALYSIS DIP (DEVICE)
Bilirubin Urine: NEGATIVE
Glucose, UA: >=1000 mg/dL — AB
HGB URINE DIPSTICK: NEGATIVE
LEUKOCYTES UA: NEGATIVE
NITRITE: NEGATIVE
Protein, ur: NEGATIVE mg/dL
Specific Gravity, Urine: 1.02 (ref 1.005–1.030)
Urobilinogen, UA: 0.2 mg/dL (ref 0.0–1.0)
pH: 5 (ref 5.0–8.0)

## 2017-10-10 LAB — GLUCOSE, CAPILLARY: GLUCOSE-CAPILLARY: 291 mg/dL — AB (ref 65–99)

## 2017-10-10 NOTE — ED Provider Notes (Signed)
[facility]    CSN: [id number] Arrival date & time: 10/10/17  1327     History   Chief Complaint Chief Complaint  Patient presents with  . Hematuria    HPI Lisa Anderson is a 62 y.o. female.   Lisa Anderson presents with concerns of blood to her urine. She states she had three days of blood to her urine approximately 3 weeks ago. It has since resolved but she feels her urine is dark in color again. She states she has mild pelvic pressure. Some urinary frequency without pain with urination. Denies vaginal symptoms. She is not sexually active. States last year she had an episode of vaginal bleeding and was recommended to follow with gynecology which she has not done. Denies diarrhea or constipation. Denies fevers. Without known urological history. History of diabetes, hypertension, CHF, CVA, breast cancer. She is on plavix, insulin. States she does feel that she has had elevated blood sugars.    ROS per HPI.       Past Medical History:  Diagnosis Date  . Asthma   . Cancer ([place])    breast  . Chronic congestive heart failure with left ventricular diastolic dysfunction ([place])   . Diabetes mellitus   . Hyperlipidemia   . Hypertension   . Stroke [facility])     Patient Active Problem List   Diagnosis Date Noted  . Bipolar I disorder ([place]) 05/23/2017  . 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 Major depressive disorder, recurrent episode, severe ([place]) 05/21/2017  . Cocaine use disorder, severe, dependence ([place]) 05/21/2017  . Suicidal intent 05/20/2017  . Drug overdose 05/19/2017  . Family history of colon cancer 02/23/2017  . Rectal bleeding 02/23/2017  . Gastroesophageal reflux disease without esophagitis 02/23/2017  . Intractable vomiting with nausea 02/23/2017  . Abnormal thyroid blood test 01/02/2017  . Insomnia 01/02/2017  . Anxiety 01/02/2017  . IDDM (insulin dependent diabetes mellitus) ([place])   . Asthma  11/12/2015  . Acute respiratory failure ([place]) 11/11/2015  . TIA (transient ischemic attack) 11/01/2015  . Chronic diastolic CHF (congestive heart failure) ([place]) 11/01/2015  . Acute focal neurological deficit 11/01/2015  . 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 HLD (hyperlipidemia)   . Palpitations   . History of stroke 08/05/2015  . Hypertension 08/05/2015  . Stroke due to embolism of right posterior cerebral artery ([place]) 08/05/2015  . Diabetes mellitus 08/05/2015  . Alcohol dependency ([place]) 08/10/2013  . Cocaine abuse ([place]) 08/08/2013  . Alcohol abuse 08/08/2013  . MDD (major depressive disorder) 08/08/2013    Past Surgical History:  Procedure Laterality Date  . BREAST LUMPECTOMY    . EP IMPLANTABLE DEVICE N/A 08/07/2015   Procedure: Loop Recorder Insertion;  Surgeon: Thompson Grayer, MD;  Location: [facility];  Service: Cardiovascular;  Laterality: N/A;  . TEE WITHOUT CARDIOVERSION N/A 08/07/2015   Procedure: TRANSESOPHAGEAL ECHOCARDIOGRAM (TEE);  Surgeon: Sueanne Margarita, MD;  Location: [facility] ENDOSCOPY;  Service: Cardiovascular;  Laterality: N/A;  . TONSILLECTOMY      OB History    No data available       Home Medications    Prior to Admission medications   Medication Sig Start Date End Date Taking? Authorizing Provider  albuterol (PROVENTIL HFA;VENTOLIN HFA) 108 (90 Base) MCG/ACT inhaler Inhale 2 puffs into the lungs every 6 (six) hours as needed for wheezing or shortness of breath. Shortness of breath 07/21/17   Arnoldo Morale, MD  aspirin EC  81 MG EC tablet Take 2 tablets (162 mg total) by mouth daily. Patient not taking: Reported on 09/12/2017 05/27/17   Money, Lowry Ram, FNP  BAYER MICROLET LANCETS lancets Use as instructed 3 times daily E11.9 08/07/17   Tresa Garter, MD  Bismuth Subsalicylate 423 NT/61WE SUSP Take 15 mLs (525 mg total) by mouth 4 (four) times daily. Patient not taking: Reported on 09/12/2017 07/26/17   Ladell Pier,  MD  Blood Glucose Monitoring Suppl (CONTOUR NEXT MONITOR) w/Device KIT 1 strip 3 (three) times daily by Does not apply route. E11.9 08/07/17   Tresa Garter, MD  clopidogrel (PLAVIX) 75 MG tablet Take 1 tablet (75 mg total) by mouth daily. 07/21/17   Arnoldo Morale, MD  diltiazem (CARDIZEM CD) 360 MG 24 hr capsule Take 1 capsule (360 mg total) by mouth daily. 07/21/17   Arnoldo Morale, MD  DULoxetine (CYMBALTA) 60 MG capsule Take 1 capsule (60 mg total) by mouth daily. For mood control 07/21/17   Arnoldo Morale, MD  gabapentin (NEURONTIN) 300 MG capsule Take 1 capsule (300 mg total) by mouth 2 (two) times daily. 07/21/17   Arnoldo Morale, MD  glipiZIDE (GLUCOTROL) 5 MG tablet Take 1 tablet (5 mg total) by mouth 2 (two) times daily before a meal. 07/21/17   Arnoldo Morale, MD  glucose blood (CONTOUR NEXT TEST) test strip Use as instructed 3 times daily. E11.9 08/07/17   Tresa Garter, MD  hydrALAZINE (APRESOLINE) 50 MG tablet Take 1 tablet (50 mg total) by mouth 3 (three) times daily. For High blood pressure 07/21/17   Arnoldo Morale, MD  hydrochlorothiazide (MICROZIDE) 12.5 MG capsule Take 1 capsule (12.5 mg total) by mouth daily. 07/21/17   Arnoldo Morale, MD  hydrOXYzine (ATARAX/VISTARIL) 50 MG tablet Take 1 tablet (50 mg total) by mouth 3 (three) times daily as needed (sleep). And sleep 05/26/17   Money, Darnelle Maffucci B, FNP  insulin aspart (NOVOLOG) 100 UNIT/ML injection Sliding scale CBG 70 - 120: 0 units CBG 121 - 150: 1 unit,  CBG 151 - 200: 2 units,  CBG 201 - 250: 3 units,  CBG 251 - 300: 5 units,  CBG 301 - 350: 7 units,  CBG 351 - 400: 9 units   CBG > 400: 9 units and notify your MD 07/21/17   Arnoldo Morale, MD  insulin glargine (LANTUS) 100 UNIT/ML injection Inject 0.2 mLs (20 Units total) into the skin at bedtime. Patient taking differently: Inject 15 Units into the skin at bedtime.  07/21/17   Arnoldo Morale, MD  metFORMIN (GLUCOPHAGE) 1000 MG tablet Take 1 tablet (1,000 mg total) 2 (two)  times daily with a meal by mouth. 08/02/17   Tresa Garter, MD  metoCLOPramide (REGLAN) 10 MG tablet Take 1 tablet (10 mg total) by mouth 3 (three) times daily before meals. Patient not taking: Reported on 09/12/2017 07/21/17   Arnoldo Morale, MD  metoprolol tartrate (LOPRESSOR) 25 MG tablet Take 0.5 tablets (12.5 mg total) by mouth 2 (two) times daily. 09/12/17   Charlesetta Shanks, MD  metroNIDAZOLE (FLAGYL) 250 MG tablet Take 1 tablet (250 mg total) by mouth 4 (four) times daily. Patient not taking: Reported on 09/12/2017 07/26/17   Ladell Pier, MD  nicotine (NICODERM CQ) 7 mg/24hr patch Place 1 patch (7 mg total) daily onto the skin. Patient not taking: Reported on 09/12/2017 08/03/17   Arnoldo Morale, MD  omeprazole (PRILOSEC) 20 MG capsule Take 1 capsule (20 mg total) by mouth 2 (two) times daily  before a meal. Patient not taking: Reported on 09/12/2017 07/26/17   Ladell Pier, MD  ondansetron (ZOFRAN-ODT) 4 MG disintegrating tablet Take 1 tablet (4 mg total) every 8 (eight) hours as needed by mouth for nausea or vomiting. 08/02/17   Vanessa Kick, MD  potassium chloride SA (K-DUR,KLOR-CON) 20 MEQ tablet Take 1 tablet (20 mEq total) by mouth daily. 07/21/17   Arnoldo Morale, MD  potassium chloride SA (K-DUR,KLOR-CON) 20 MEQ tablet Take 1 tablet (20 mEq total) by mouth every Monday,Wednesday,Friday, and Sunday at 6 PM. This is to be taken every other day with your normal 20 mEq dose, so that every other day you take 40 mEq. 09/13/17   Charlesetta Shanks, MD  rosuvastatin (CRESTOR) 40 MG tablet Take 1 tablet (40 mg total) by mouth daily. 07/21/17   Arnoldo Morale, MD  tetracycline (ACHROMYCIN,SUMYCIN) 500 MG capsule Take 1 capsule (500 mg total) by mouth 4 (four) times daily. Patient not taking: Reported on 09/12/2017 07/26/17   Ladell Pier, MD  traZODone (DESYREL) 50 MG tablet Take 1 tablet (50 mg total) by mouth at bedtime as needed for sleep. 07/21/17   Arnoldo Morale, MD     Family History Family History  Problem Relation Age of Onset  . Diabetes Mother   . Hyperlipidemia Mother   . Hypertension Mother   . Breast cancer Mother   . Rheum arthritis Father   . Asthma Father   . Stroke Father   . Thyroid disease Father   . Thyroid disease Brother   . Colon cancer Brother        in his 32s  . Breast cancer Sister     Social History Social History   Tobacco Use  . Smoking status: Current Every Day Smoker    Packs/day: 0.50    Types: Cigarettes  . Smokeless tobacco: Never Used  Substance Use Topics  . Alcohol use: Yes    Alcohol/week: 3.6 oz    Types: 6 Cans of beer per week    Comment: occasionally  . Drug use: No    Comment: last used 3 months ago.     Allergies   Prednisone; Benicar [olmesartan]; Celexa [citalopram]; Effexor [venlafaxine]; and Shrimp [shellfish allergy]   Review of Systems Review of Systems   Physical Exam Triage Vital Signs ED Triage Vitals  Enc Vitals Group     BP 10/10/17 1414 140/63     Pulse Rate 10/10/17 1414 86     Resp 10/10/17 1414 18     Temp 10/10/17 1414 98.6 F (37 C)     Temp src --      SpO2 10/10/17 1414 99 %     Weight --      Height --      Head Circumference --      Peak Flow --      Pain Score 10/10/17 1413 6     Pain Loc --      Pain Edu? --      Excl. in [place]? --    No data found.  Updated Vital Signs BP 140/63   Pulse 86   Temp 98.6 F (37 C)   Resp 18   SpO2 99%   Visual Acuity Right Eye Distance:   Left Eye Distance:   Bilateral Distance:    Right Eye Near:   Left Eye Near:    Bilateral Near:     Physical Exam  Constitutional: She is oriented to person, place, and time. She appears  well-developed and well-nourished. No distress.  Cardiovascular: Normal rate, regular rhythm and normal heart sounds.  Pulmonary/Chest: Effort normal and breath sounds normal.  Abdominal: Soft. There is no tenderness.  Without any tenderness on exam  Neurological: She is alert and  oriented to person, place, and time.  Skin: Skin is warm and dry.     UC Treatments / Results  Labs (all labs ordered are listed, but only abnormal results are displayed) Labs Reviewed  GLUCOSE, CAPILLARY - Abnormal; Notable for the following components:      Result Value   Glucose-Capillary 291 (*)    All other components within normal limits  POCT URINALYSIS DIP (DEVICE) - Abnormal; Notable for the following components:   Glucose, UA >=1000 (*)    Ketones, ur TRACE (*)    All other components within normal limits  URINE CYTOLOGY ANCILLARY ONLY    EKG  EKG Interpretation None       Radiology No results found.  Procedures Procedures (including critical care time)  Medications Ordered in UC Medications - No data to display   Initial Impression / Assessment and Plan / UC Course  I have reviewed the triage vital signs and the nursing notes.  Pertinent labs & imaging results that were available during my care of the patient were reviewed by me and considered in my medical decision making (see chart for details).     Without hgb to urine dip today. Large glucose and ketones present. POC BS 291. Without acute abdominal findings at this time. Without tachypnea, tachycardia. Afebrile. Discussed with patient that concentrated urine likely due to mild dehydration related to elevated blood sugar and frequent urination. Push fluids. Monitor blood sugars, insulin as ordered. Recommend follow up with PCP for diabetes recheck and management. Continue to follow with gyn as previously recommended as if pelvic discomfort persists, pelvic exam deferred at this time.   On return to room to discuss discharge patient had left clinic. Left prior to receiving discharge education and discussion.    Final Clinical Impressions(s) / UC Diagnoses   Final diagnoses:  Elevated blood sugar  Glucose found in urine on examination    ED Discharge Orders    None       Controlled Substance  Prescriptions [place] Controlled Substance Registry consulted? Not Applicable   Zigmund Gottron, NP 10/10/17 1502    Zigmund Gottron, NP 10/10/17 1511    Zigmund Gottron, NP 10/10/17 1514

## 2017-10-10 NOTE — ED Triage Notes (Signed)
Pt here for hematuria. Reports that it has been going on for weeks. sts lymphadenopathy and she feels like she has "rocks" in the bottom of her stomach. sts also some leakage.

## 2017-10-10 NOTE — Discharge Instructions (Signed)
Your urine is without blood today in clinic. Increase your water intake as this will likely help with your elevated blood sugars.  Monitor your blood sugar and use of insulin as ordered to keep managed.  Please follow up with your primary care provider for recheck of your diabetes and management of your blood sugars. Please follow up with gynecology as previously recommended. If develop dizziness, increased abdominal pain, weakness, increased blood in your urine return to be seen or go to the Er.

## 2017-10-11 LAB — URINE CYTOLOGY ANCILLARY ONLY
Chlamydia: NEGATIVE
Neisseria Gonorrhea: NEGATIVE
Trichomonas: NEGATIVE

## 2017-10-12 ENCOUNTER — Emergency Department (HOSPITAL_COMMUNITY): Payer: BLUE CROSS/BLUE SHIELD

## 2017-10-12 ENCOUNTER — Emergency Department (HOSPITAL_COMMUNITY)
Admission: EM | Admit: 2017-10-12 | Discharge: 2017-10-12 | Discharge status: Against Medical Advice | Payer: BLUE CROSS/BLUE SHIELD | Attending: Emergency Medicine | Admitting: Emergency Medicine

## 2017-10-12 ENCOUNTER — Other Ambulatory Visit: Payer: Self-pay

## 2017-10-12 ENCOUNTER — Encounter (HOSPITAL_COMMUNITY): Payer: Self-pay

## 2017-10-12 DIAGNOSIS — M25562 Pain in left knee: Secondary | ICD-10-CM | POA: Insufficient documentation

## 2017-10-12 DIAGNOSIS — Z5321 Procedure and treatment not carried out due to patient leaving prior to being seen by health care provider: Secondary | ICD-10-CM | POA: Diagnosis not present

## 2017-10-12 LAB — CBC
HCT: 39.8 % (ref 36.0–46.0)
Hemoglobin: 12.8 g/dL (ref 12.0–15.0)
MCH: 28.3 pg (ref 26.0–34.0)
MCHC: 32.2 g/dL (ref 30.0–36.0)
MCV: 87.9 fL (ref 78.0–100.0)
PLATELETS: 324 10*3/uL (ref 150–400)
RBC: 4.53 MIL/uL (ref 3.87–5.11)
RDW: 13.8 % (ref 11.5–15.5)
WBC: 9.4 10*3/uL (ref 4.0–10.5)

## 2017-10-12 LAB — BASIC METABOLIC PANEL
Anion gap: 11 (ref 5–15)
BUN: 12 mg/dL (ref 6–20)
CALCIUM: 9 mg/dL (ref 8.9–10.3)
CHLORIDE: 102 mmol/L (ref 101–111)
CO2: 26 mmol/L (ref 22–32)
CREATININE: 1.02 mg/dL — AB (ref 0.44–1.00)
GFR calc non Af Amer: 59 mL/min — ABNORMAL LOW (ref >60)
Glucose, Bld: 181 mg/dL — ABNORMAL HIGH (ref 65–99)
Potassium: 3.5 mmol/L (ref 3.5–5.1)
SODIUM: 139 mmol/L (ref 135–145)

## 2017-10-12 LAB — URINALYSIS, ROUTINE W REFLEX MICROSCOPIC
BILIRUBIN URINE: NEGATIVE
Glucose, UA: 150 mg/dL — AB
Hgb urine dipstick: NEGATIVE
Ketones, ur: NEGATIVE mg/dL
Leukocytes, UA: NEGATIVE
NITRITE: NEGATIVE
Protein, ur: NEGATIVE mg/dL
SPECIFIC GRAVITY, URINE: 1.004 — AB (ref 1.005–1.030)
pH: 6 (ref 5.0–8.0)

## 2017-10-12 NOTE — ED Triage Notes (Signed)
Pt states she has left knee pain that is painful to walk. She also states she has hematuria. She brought a sample of urine that is orange in color.

## 2017-10-13 ENCOUNTER — Other Ambulatory Visit: Payer: Self-pay

## 2017-10-13 ENCOUNTER — Encounter (HOSPITAL_COMMUNITY): Payer: Self-pay | Admitting: Emergency Medicine

## 2017-10-13 ENCOUNTER — Emergency Department (HOSPITAL_COMMUNITY)
Admission: EM | Admit: 2017-10-13 | Discharge: 2017-10-13 | Disposition: A | Discharge status: Home / Self Care | Payer: BLUE CROSS/BLUE SHIELD | Attending: Emergency Medicine | Admitting: Emergency Medicine

## 2017-10-13 DIAGNOSIS — Z794 Long term (current) use of insulin: Secondary | ICD-10-CM | POA: Insufficient documentation

## 2017-10-13 DIAGNOSIS — Z853 Personal history of malignant neoplasm of breast: Secondary | ICD-10-CM | POA: Diagnosis not present

## 2017-10-13 DIAGNOSIS — Z79899 Other long term (current) drug therapy: Secondary | ICD-10-CM | POA: Diagnosis not present

## 2017-10-13 DIAGNOSIS — I5032 Chronic diastolic (congestive) heart failure: Secondary | ICD-10-CM | POA: Insufficient documentation

## 2017-10-13 DIAGNOSIS — Z7902 Long term (current) use of antithrombotics/antiplatelets: Secondary | ICD-10-CM | POA: Insufficient documentation

## 2017-10-13 DIAGNOSIS — R319 Hematuria, unspecified: Secondary | ICD-10-CM | POA: Diagnosis not present

## 2017-10-13 DIAGNOSIS — F17210 Nicotine dependence, cigarettes, uncomplicated: Secondary | ICD-10-CM | POA: Diagnosis not present

## 2017-10-13 DIAGNOSIS — J45909 Unspecified asthma, uncomplicated: Secondary | ICD-10-CM | POA: Diagnosis not present

## 2017-10-13 DIAGNOSIS — I110 Hypertensive heart disease with heart failure: Secondary | ICD-10-CM | POA: Insufficient documentation

## 2017-10-13 DIAGNOSIS — M25562 Pain in left knee: Secondary | ICD-10-CM | POA: Diagnosis not present

## 2017-10-13 DIAGNOSIS — R3989 Other symptoms and signs involving the genitourinary system: Secondary | ICD-10-CM

## 2017-10-13 DIAGNOSIS — E1159 Type 2 diabetes mellitus with other circulatory complications: Secondary | ICD-10-CM | POA: Diagnosis not present

## 2017-10-13 DIAGNOSIS — Z7982 Long term (current) use of aspirin: Secondary | ICD-10-CM | POA: Insufficient documentation

## 2017-10-13 LAB — URINALYSIS, ROUTINE W REFLEX MICROSCOPIC
Bilirubin Urine: NEGATIVE
Hgb urine dipstick: NEGATIVE
Ketones, ur: NEGATIVE mg/dL
Leukocytes, UA: NEGATIVE
Nitrite: NEGATIVE
PH: 5 (ref 5.0–8.0)
Protein, ur: NEGATIVE mg/dL
SPECIFIC GRAVITY, URINE: 1.013 (ref 1.005–1.030)

## 2017-10-13 MED ORDER — NAPROXEN 250 MG PO TABS
500.0000 mg | ORAL_TABLET | Freq: Once | ORAL | Status: AC
Start: 2017-10-13 — End: 2017-10-13
  Administered 2017-10-13: 500 mg via ORAL
  Filled 2017-10-13: qty 2

## 2017-10-13 NOTE — ED Provider Notes (Signed)
[facility] EMERGENCY DEPARTMENT Provider Note   CSN: [id number] Arrival date & time: 10/13/17  1005     History   Chief Complaint Chief Complaint  Patient presents with  . Leg Pain  . Hematuria    HPI Lisa Anderson is a 62 y.o. female.  Lisa Anderson is a 63 y.o. Female with history of asthma, hypertension, hyperlipidemia, diabetes, stroke, CHF and breast cancer, presents to the ED for evaluation of discoloration of her urine as well as left knee pain.  Patient reports both symptoms have been present for several weeks.  Reports her urine has been orange in color, particularly with her first urine in the morning, denies any brown urine or bright red blood per urine has not been passing any clots.  Patient denies any fevers, chills, nausea, vomiting or abdominal pain.  Patient does report some feeling of fullness in the lower abdomen and pelvic region.  Patient denies any vaginal discharge or bleeding.  She has not followed up with her gynecologist in a long time.  Was seen at urgent care for this problem 2 days ago, at this time there was no evidence of blood in the urine, only glucose, patient has diabetes managed appointment appointment next week.  Patient also complaining of left knee pain, primarily over the medial aspect, no warmth or discoloration, no inciting injury, but pt is on her feet most of the day at work.  Patient has been taking Naprosyn with mild improvement.  Patient denies any numbness or tingling, reports she feels like her knee is going to give out sometimes, but denies any falls.       Past Medical History:  Diagnosis Date  . Asthma   . Cancer ([place])    breast  . Chronic congestive heart failure with left ventricular diastolic dysfunction ([place])   . Diabetes mellitus   . Hyperlipidemia   . Hypertension   . Stroke [facility])     Patient Active Problem List   Diagnosis Date Noted  . Bipolar I disorder ([place]) 05/23/2017  . MDD (major  depressive disorder), recurrent severe, without psychosis ([place]) 05/22/2017  . Major depressive disorder, recurrent episode, severe ([place]) 05/21/2017  . Cocaine use disorder, severe, dependence ([place]) 05/21/2017  . Suicidal intent 05/20/2017  . Drug overdose 05/19/2017  . Family history of colon cancer 02/23/2017  . Rectal bleeding 02/23/2017  . Gastroesophageal reflux disease without esophagitis 02/23/2017  . Intractable vomiting with nausea 02/23/2017  . Abnormal thyroid blood test 01/02/2017  . Insomnia 01/02/2017  . Anxiety 01/02/2017  . IDDM (insulin dependent diabetes mellitus) ([place])   . Asthma 11/12/2015  . Acute respiratory failure ([place]) 11/11/2015  . TIA (transient ischemic attack) 11/01/2015  . Chronic diastolic CHF (congestive heart failure) ([place]) 11/01/2015  . Acute focal neurological deficit 11/01/2015  . 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 HLD (hyperlipidemia)   . Palpitations   . History of stroke 08/05/2015  . Hypertension 08/05/2015  . Stroke due to embolism of right posterior cerebral artery ([place]) 08/05/2015  . Diabetes mellitus 08/05/2015  . Alcohol dependency ([place]) 08/10/2013  . Cocaine abuse ([place]) 08/08/2013  . Alcohol abuse 08/08/2013  . MDD (major depressive disorder) 08/08/2013    Past Surgical History:  Procedure Laterality Date  . BREAST LUMPECTOMY    . EP IMPLANTABLE DEVICE N/A 08/07/2015   Procedure: Loop Recorder Insertion;  Surgeon: Thompson Grayer, MD;  Location: [facility];  Service: Cardiovascular;  Laterality: N/A;  .  TEE WITHOUT CARDIOVERSION N/A 08/07/2015   Procedure: TRANSESOPHAGEAL ECHOCARDIOGRAM (TEE);  Surgeon: Sueanne Margarita, MD;  Location[facility] ENDOSCOPY;  Service: Cardiovascular;  Laterality: N/A;  . TONSILLECTOMY      OB History    No data available       Home Medications    Prior to Admission medications   Medication Sig Start Date End Date Taking? Authorizing Provider  albuterol (PROVENTIL  HFA;VENTOLIN HFA) 108 (90 Base) MCG/ACT inhaler Inhale 2 puffs into the lungs every 6 (six) hours as needed for wheezing or shortness of breath. Shortness of breath 07/21/17  Yes Arnoldo Morale, MD  aspirin EC 81 MG EC tablet Take 2 tablets (162 mg total) by mouth daily. 05/27/17  Yes Money, Lowry Ram, FNP  Blood Glucose Monitoring Suppl (CONTOUR NEXT MONITOR) w/Device KIT 1 strip 3 (three) times daily by Does not apply route. E11.9 08/07/17  Yes Tresa Garter, MD  clopidogrel (PLAVIX) 75 MG tablet Take 1 tablet (75 mg total) by mouth daily. 07/21/17  Yes Arnoldo Morale, MD  diltiazem (CARDIZEM CD) 360 MG 24 hr capsule Take 1 capsule (360 mg total) by mouth daily. 07/21/17  Yes Arnoldo Morale, MD  DULoxetine (CYMBALTA) 60 MG capsule Take 1 capsule (60 mg total) by mouth daily. For mood control 07/21/17  Yes Arnoldo Morale, MD  gabapentin (NEURONTIN) 300 MG capsule Take 1 capsule (300 mg total) by mouth 2 (two) times daily. 07/21/17  Yes Arnoldo Morale, MD  glipiZIDE (GLUCOTROL) 5 MG tablet Take 1 tablet (5 mg total) by mouth 2 (two) times daily before a meal. 07/21/17  Yes Amao, Enobong, MD  glucose blood (CONTOUR NEXT TEST) test strip Use as instructed 3 times daily. E11.9 08/07/17  Yes Tresa Garter, MD  hydrALAZINE (APRESOLINE) 50 MG tablet Take 1 tablet (50 mg total) by mouth 3 (three) times daily. For High blood pressure 07/21/17  Yes Amao, Enobong, MD  hydrochlorothiazide (MICROZIDE) 12.5 MG capsule Take 1 capsule (12.5 mg total) by mouth daily. 07/21/17  Yes Amao, Charlane Ferretti, MD  insulin aspart (NOVOLOG) 100 UNIT/ML injection Sliding scale CBG 70 - 120: 0 units CBG 121 - 150: 1 unit,  CBG 151 - 200: 2 units,  CBG 201 - 250: 3 units,  CBG 251 - 300: 5 units,  CBG 301 - 350: 7 units,  CBG 351 - 400: 9 units   CBG > 400: 9 units and notify your MD 07/21/17  Yes Arnoldo Morale, MD  insulin glargine (LANTUS) 100 UNIT/ML injection Inject 0.2 mLs (20 Units total) into the skin at bedtime. Patient  taking differently: Inject 15 Units into the skin at bedtime.  07/21/17  Yes Arnoldo Morale, MD  metFORMIN (GLUCOPHAGE) 1000 MG tablet Take 1 tablet (1,000 mg total) 2 (two) times daily with a meal by mouth. 08/02/17  Yes Tresa Garter, MD  metoprolol tartrate (LOPRESSOR) 25 MG tablet Take 0.5 tablets (12.5 mg total) by mouth 2 (two) times daily. 09/12/17  Yes Charlesetta Shanks, MD  potassium chloride SA (K-DUR,KLOR-CON) 20 MEQ tablet Take 1 tablet (20 mEq total) by mouth every Monday,Wednesday,Friday, and Sunday at 6 PM. This is to be taken every other day with your normal 20 mEq dose, so that every other day you take 40 mEq. 09/13/17  Yes Charlesetta Shanks, MD  rosuvastatin (CRESTOR) 40 MG tablet Take 1 tablet (40 mg total) by mouth daily. 07/21/17  Yes Arnoldo Morale, MD  traZODone (DESYREL) 50 MG tablet Take 1 tablet (50 mg total) by mouth  at bedtime as needed for sleep. 07/21/17  Yes Arnoldo Morale, MD  BAYER MICROLET LANCETS lancets Use as instructed 3 times daily E11.9 Patient not taking: Reported on 10/13/2017 08/07/17   Tresa Garter, MD  Bismuth Subsalicylate 053 ZJ/67HA SUSP Take 15 mLs (525 mg total) by mouth 4 (four) times daily. Patient not taking: Reported on 09/12/2017 07/26/17   Ladell Pier, MD  hydrOXYzine (ATARAX/VISTARIL) 50 MG tablet Take 1 tablet (50 mg total) by mouth 3 (three) times daily as needed (sleep). And sleep Patient not taking: Reported on 10/13/2017 05/26/17   Money, Lowry Ram, FNP  metoCLOPramide (REGLAN) 10 MG tablet Take 1 tablet (10 mg total) by mouth 3 (three) times daily before meals. Patient not taking: Reported on 09/12/2017 07/21/17   Arnoldo Morale, MD  metroNIDAZOLE (FLAGYL) 250 MG tablet Take 1 tablet (250 mg total) by mouth 4 (four) times daily. Patient not taking: Reported on 09/12/2017 07/26/17   Ladell Pier, MD  nicotine (NICODERM CQ) 7 mg/24hr patch Place 1 patch (7 mg total) daily onto the skin. Patient not taking: Reported on  09/12/2017 08/03/17   Arnoldo Morale, MD  omeprazole (PRILOSEC) 20 MG capsule Take 1 capsule (20 mg total) by mouth 2 (two) times daily before a meal. Patient not taking: Reported on 09/12/2017 07/26/17   Ladell Pier, MD  ondansetron (ZOFRAN-ODT) 4 MG disintegrating tablet Take 1 tablet (4 mg total) every 8 (eight) hours as needed by mouth for nausea or vomiting. Patient not taking: Reported on 10/13/2017 08/02/17   Vanessa Kick, MD  potassium chloride SA (K-DUR,KLOR-CON) 20 MEQ tablet Take 1 tablet (20 mEq total) by mouth daily. Patient not taking: Reported on 10/13/2017 07/21/17   Arnoldo Morale, MD  tetracycline (ACHROMYCIN,SUMYCIN) 500 MG capsule Take 1 capsule (500 mg total) by mouth 4 (four) times daily. Patient not taking: Reported on 09/12/2017 07/26/17   Ladell Pier, MD    Family History Family History  Problem Relation Age of Onset  . Diabetes Mother   . Hyperlipidemia Mother   . Hypertension Mother   . Breast cancer Mother   . Rheum arthritis Father   . Asthma Father   . Stroke Father   . Thyroid disease Father   . Thyroid disease Brother   . Colon cancer Brother        in his 83s  . Breast cancer Sister     Social History Social History   Tobacco Use  . Smoking status: Current Every Day Smoker    Packs/day: 0.50    Types: Cigarettes  . Smokeless tobacco: Never Used  Substance Use Topics  . Alcohol use: Yes    Alcohol/week: 3.6 oz    Types: 6 Cans of beer per week    Comment: occasionally  . Drug use: No    Comment: last used 3 months ago.     Allergies   Prednisone; Benicar [olmesartan]; Celexa [citalopram]; Effexor [venlafaxine]; and Shrimp [shellfish allergy]   Review of Systems Review of Systems  Constitutional: Negative for chills and fever.  HENT: Negative.   Eyes: Negative.   Respiratory: Negative for chest tightness and shortness of breath.   Cardiovascular: Negative for chest pain and leg swelling.  Gastrointestinal: Negative for  abdominal pain, blood in stool, constipation, diarrhea, nausea and vomiting.  Genitourinary: Negative for dysuria, flank pain, frequency, vaginal bleeding and vaginal discharge.       Urine Discoloration  Musculoskeletal: Positive for arthralgias (Left knee).  Skin: Negative for color change,  rash and wound.  Neurological: Negative for weakness and numbness.  All other systems reviewed and are negative.    Physical Exam Updated Vital Signs BP 104/86 (BP Location: Right Arm)   Pulse 81   Temp 98.1 F (36.7 C) (Oral)   Resp 16   SpO2 99%   Physical Exam  Constitutional: She is oriented to person, place, and time. She appears well-developed and well-nourished. No distress.  HENT:  Head: Normocephalic and atraumatic.  Mouth/Throat: Oropharynx is clear and moist.  Eyes: Right eye exhibits no discharge. Left eye exhibits no discharge.  Neck: Neck supple.  Cardiovascular: Normal rate, regular rhythm, normal heart sounds and intact distal pulses.  Pulmonary/Chest: Effort normal and breath sounds normal. No stridor. No respiratory distress. She has no wheezes. She has no rales.  Abdominal: Soft. Bowel sounds are normal. She exhibits no distension and no mass. There is no tenderness. There is no guarding.  Genitourinary:  Genitourinary Comments: Offered pelvic exam but patient declined.  No lymphadenopathy  Musculoskeletal:  Tenderness over the medial aspect of the knee, no appreciable joint effusion, no erythema or warmth, full extension and flexion of the knee, no distal swelling, DP and TP pulses 2+, sensation intact, 5/5 dorsi and plantar flexion  Neurological: She is alert and oriented to person, place, and time. Coordination normal.  Skin: Skin is warm and dry. She is not diaphoretic.  Psychiatric: She has a normal mood and affect. Her behavior is normal.  Nursing note and vitals reviewed.    ED Treatments / Results  Labs (all labs ordered are listed, but only abnormal results  are displayed) Labs Reviewed  URINALYSIS, ROUTINE W REFLEX MICROSCOPIC - Abnormal; Notable for the following components:      Result Value   Glucose, UA >=500 (*)    Bacteria, UA RARE (*)    Squamous Epithelial / LPF 0-5 (*)    All other components within normal limits    EKG  EKG Interpretation None       Radiology Dg Knee Complete 4 Views Left  Result Date: 10/12/2017 CLINICAL DATA:  Buckling of left knee 3 weeks ago with pain. EXAM: LEFT KNEE - COMPLETE 4+ VIEW COMPARISON:  None. FINDINGS: No evidence of fracture, dislocation, or joint effusion. Mild narrow femoral tibial joint space is noted. Soft tissues are unremarkable. IMPRESSION: No acute fracture or dislocation. Electronically Signed   By: Abelardo Diesel M.D.   On: 10/12/2017 16:35    Procedures Procedures (including critical care time)  Medications Ordered in ED Medications  naproxen (NAPROSYN) tablet 500 mg (500 mg Oral Given 10/13/17 1256)     Initial Impression / Assessment and Plan / ED Course  I have reviewed the triage vital signs and the nursing notes.  Pertinent labs & imaging results that were available during my care of the patient were reviewed by me and considered in my medical decision making (see chart for details).  Patient presents with concern for hematuria has had discolored urine for the past several weeks.  No urinary symptoms, fevers, chills, abdominal pain, nausea or vomiting.  No bright red blood per urine or clots.  No evidence of blood or hemoglobin in the urine today on urinalysis were on previous UA at urgent care.  There is glucose present in the urine.  Abdomen is benign, patient declined pelvic exam.  Will have patient follow-up with her PCP referral provided for gynecologist as well.  Left knee tender to palpation over the medial aspect, left lower  extremity is neurovascularly intact, x-ray shows no evidence of acute fracture or dislocation, no appreciable joint effusion.  Exam  non-concerning for septic arthritis.  X-ray does show some narrowing of the tibiofemoral joint space.  Have patient continue with NSAID therapy, provided patient with knee sleeve which she reports has improved knee pain and stability already.  If knee pain is still not improving will have patient follow-up with orthopedics.  Strict return precautions discussed.  At this time patient is stable for discharge home.  She expresses understanding and is in agreement with plan.  Final Clinical Impressions(s) / ED Diagnoses   Final diagnoses:  Left knee pain, unspecified chronicity  Urine discoloration    ED Discharge Orders    None       Jacqlyn Larsen, [place] 10/13/17 1434    Gareth Morgan, MD 10/15/17 [phone]

## 2017-10-13 NOTE — Discharge Instructions (Addendum)
Your evaluation has been reassuring, x-ray shows no evidence of fracture or acute bony abnormality, urine shows no evidence of blood and your kidney function on your labs yesterday was at baseline and hemoglobin normal.  Continue using Naprosyn and/or Tylenol for knee pain and wear knee brace provided please call to schedule follow-up appointment with orthopedics if pain is not improving.  Regarding pelvic discomfort it is very important that you follow-up with gynecology, use the referral provided and call to schedule an appointment.  Please follow-up with your primary doctor later this month as planned as well.  If you have fevers, nausea, vomiting, or focal abdominal pain, or bright red blood noted in the urine please return to the ED for reevaluation.

## 2017-10-13 NOTE — ED Triage Notes (Addendum)
Pt reports left knee pain and hematuria, has sample of urine that she brought with her from home. Seen here for same yesterday, had labs and xrays, states she left before a doctor saw her.

## 2017-10-16 LAB — URINE CYTOLOGY ANCILLARY ONLY
BACTERIAL VAGINITIS: NEGATIVE
Candida vaginitis: NEGATIVE

## 2017-10-23 ENCOUNTER — Ambulatory Visit: Payer: Self-pay | Admitting: Family Medicine

## 2017-10-23 ENCOUNTER — Ambulatory Visit (INDEPENDENT_AMBULATORY_CARE_PROVIDER_SITE_OTHER): Payer: BLUE CROSS/BLUE SHIELD | Admitting: *Deleted

## 2017-10-23 DIAGNOSIS — I63011 Cerebral infarction due to thrombosis of right vertebral artery: Secondary | ICD-10-CM | POA: Diagnosis not present

## 2017-10-24 NOTE — Progress Notes (Signed)
[facility] Summary Report / Loop Recorder 

## 2017-11-01 ENCOUNTER — Telehealth: Payer: Self-pay | Admitting: Cardiology

## 2017-11-01 NOTE — Telephone Encounter (Signed)
LMOVM requesting that pt send manual transmission b/c home monitor has not updated in at least 14 days.    

## 2017-11-06 LAB — CUP PACEART REMOTE DEVICE CHECK
Date Time Interrogation Session: 20190128210949
MDC IDC PG IMPLANT DT: 20161111

## 2017-11-09 ENCOUNTER — Encounter: Payer: Self-pay | Admitting: Cardiology

## 2017-11-23 ENCOUNTER — Encounter: Payer: Self-pay | Admitting: Cardiology

## 2017-11-27 ENCOUNTER — Encounter: Payer: BLUE CROSS/BLUE SHIELD | Admitting: *Deleted

## 2017-12-05 ENCOUNTER — Encounter (HOSPITAL_COMMUNITY): Payer: Self-pay | Admitting: Emergency Medicine

## 2017-12-05 ENCOUNTER — Emergency Department (HOSPITAL_COMMUNITY): Payer: BLUE CROSS/BLUE SHIELD

## 2017-12-05 ENCOUNTER — Observation Stay (HOSPITAL_COMMUNITY)
Admission: EM | Admit: 2017-12-05 | Discharge: 2017-12-07 | Disposition: A | Discharge status: Home / Self Care | Payer: BLUE CROSS/BLUE SHIELD | Attending: Internal Medicine | Admitting: Internal Medicine

## 2017-12-05 DIAGNOSIS — I771 Stricture of artery: Secondary | ICD-10-CM | POA: Diagnosis not present

## 2017-12-05 DIAGNOSIS — R4701 Aphasia: Principal | ICD-10-CM | POA: Insufficient documentation

## 2017-12-05 DIAGNOSIS — Z79899 Other long term (current) drug therapy: Secondary | ICD-10-CM | POA: Insufficient documentation

## 2017-12-05 DIAGNOSIS — I63431 Cerebral infarction due to embolism of right posterior cerebral artery: Secondary | ICD-10-CM

## 2017-12-05 DIAGNOSIS — Z7902 Long term (current) use of antithrombotics/antiplatelets: Secondary | ICD-10-CM | POA: Diagnosis not present

## 2017-12-05 DIAGNOSIS — Z794 Long term (current) use of insulin: Secondary | ICD-10-CM | POA: Insufficient documentation

## 2017-12-05 DIAGNOSIS — Z8673 Personal history of transient ischemic attack (TIA), and cerebral infarction without residual deficits: Secondary | ICD-10-CM | POA: Diagnosis not present

## 2017-12-05 DIAGNOSIS — E119 Type 2 diabetes mellitus without complications: Secondary | ICD-10-CM | POA: Diagnosis not present

## 2017-12-05 DIAGNOSIS — IMO0001 Reserved for inherently not codable concepts without codable children: Secondary | ICD-10-CM

## 2017-12-05 DIAGNOSIS — E785 Hyperlipidemia, unspecified: Secondary | ICD-10-CM | POA: Insufficient documentation

## 2017-12-05 DIAGNOSIS — D72829 Elevated white blood cell count, unspecified: Secondary | ICD-10-CM | POA: Diagnosis not present

## 2017-12-05 DIAGNOSIS — E1159 Type 2 diabetes mellitus with other circulatory complications: Secondary | ICD-10-CM | POA: Diagnosis not present

## 2017-12-05 DIAGNOSIS — I10 Essential (primary) hypertension: Secondary | ICD-10-CM

## 2017-12-05 DIAGNOSIS — G459 Transient cerebral ischemic attack, unspecified: Secondary | ICD-10-CM | POA: Diagnosis not present

## 2017-12-05 DIAGNOSIS — F17210 Nicotine dependence, cigarettes, uncomplicated: Secondary | ICD-10-CM | POA: Diagnosis not present

## 2017-12-05 DIAGNOSIS — E876 Hypokalemia: Secondary | ICD-10-CM

## 2017-12-05 LAB — URINALYSIS, ROUTINE W REFLEX MICROSCOPIC
BILIRUBIN URINE: NEGATIVE
Bacteria, UA: NONE SEEN
HGB URINE DIPSTICK: NEGATIVE
KETONES UR: NEGATIVE mg/dL
LEUKOCYTES UA: NEGATIVE
Nitrite: NEGATIVE
PH: 6 (ref 5.0–8.0)
PROTEIN: NEGATIVE mg/dL
Specific Gravity, Urine: 1.017 (ref 1.005–1.030)

## 2017-12-05 LAB — COMPREHENSIVE METABOLIC PANEL
ALBUMIN: 3.8 g/dL (ref 3.5–5.0)
ALK PHOS: 96 U/L (ref 38–126)
ALT: 19 U/L (ref 14–54)
ANION GAP: 11 (ref 5–15)
AST: 21 U/L (ref 15–41)
BILIRUBIN TOTAL: 0.3 mg/dL (ref 0.3–1.2)
BUN: 9 mg/dL (ref 6–20)
CO2: 26 mmol/L (ref 22–32)
Calcium: 9 mg/dL (ref 8.9–10.3)
Chloride: 103 mmol/L (ref 101–111)
Creatinine, Ser: 1.07 mg/dL — ABNORMAL HIGH (ref 0.44–1.00)
GFR calc Af Amer: >60 mL/min (ref >60)
GFR calc non Af Amer: 55 mL/min — ABNORMAL LOW (ref >60)
GLUCOSE: 154 mg/dL — AB (ref 65–99)
POTASSIUM: 3.1 mmol/L — AB (ref 3.5–5.1)
SODIUM: 140 mmol/L (ref 135–145)
Total Protein: 6.9 g/dL (ref 6.5–8.1)

## 2017-12-05 LAB — CBC
HEMATOCRIT: 41.6 % (ref 36.0–46.0)
HEMOGLOBIN: 12.9 g/dL (ref 12.0–15.0)
MCH: 27.5 pg (ref 26.0–34.0)
MCHC: 31 g/dL (ref 30.0–36.0)
MCV: 88.7 fL (ref 78.0–100.0)
Platelets: 297 10*3/uL (ref 150–400)
RBC: 4.69 MIL/uL (ref 3.87–5.11)
RDW: 13.1 % (ref 11.5–15.5)
WBC: 12.7 10*3/uL — AB (ref 4.0–10.5)

## 2017-12-05 LAB — PROTIME-INR
INR: 1
Prothrombin Time: 13.1 seconds (ref 11.4–15.2)

## 2017-12-05 LAB — APTT: aPTT: 28 seconds (ref 24–36)

## 2017-12-05 MED ORDER — LORAZEPAM 2 MG/ML IJ SOLN: 1.0000 mg | Freq: Once | INTRAMUSCULAR | Status: DC

## 2017-12-05 MED ORDER — GADOBENATE DIMEGLUMINE 529 MG/ML IV SOLN
15.0000 mL | Freq: Once | INTRAVENOUS | Status: AC | PRN
  Administered 2017-12-05: 15 mL via INTRAVENOUS

## 2017-12-05 MED ORDER — POTASSIUM CHLORIDE CRYS ER 20 MEQ PO TBCR: 40.0000 meq | EXTENDED_RELEASE_TABLET | Freq: Once | ORAL | Status: DC

## 2017-12-05 MED ORDER — LORAZEPAM 2 MG/ML IJ SOLN
1.0000 mg | Freq: Once | INTRAMUSCULAR | Status: AC
  Administered 2017-12-05: 1 mg via INTRAVENOUS
  Filled 2017-12-05: qty 1

## 2017-12-05 NOTE — ED Provider Notes (Signed)
[facility] EMERGENCY DEPARTMENT Provider Note   CSN: [id number] Arrival date & time: 12/05/17  1531     History   Chief Complaint Chief Complaint  Patient presents with  . Transient Ischemic Attack    HPI Lisa Anderson is a 62 y.o. female with past medical history of asthma, insulin-dependent type 2 diabetes, hypertension, CVA, TIA, polysubstance abuse, presenting to the ED with episode of a aphasia that occurred around 325 this afternoon.  Patient states she was at [facility], getting her medications refilled, and she had an episode lasting 3-4 minutes where she was unable to speak.  She states she was thinking normally however she was unable to get words out.  She states those symptoms resolved, however [facility] called EMS to bring her to the ED.  She states the staff at [facility] did not mention any facial droop at the time.  She did not have any upper or lower extremity weakness or vision changes.  She states this is similar to her previous episodes of TIA.  She states she is at her baseline, however has a pressure like headache.  States she also had a similar headache yesterday evening.  Reports some chronic left lower extremity weakness from previous strokes.  Takes plavix daily. States she has been clean from cocaine use for 6 months. Her neurologist is Dr. Ricki Rodriguez at [facility] neurology.  The history is provided by the patient.    Past Medical History:  Diagnosis Date  . Asthma   . Cancer ([place])    breast  . Chronic congestive heart failure with left ventricular diastolic dysfunction ([place])   . Diabetes mellitus   . Hyperlipidemia   . Hypertension   . Stroke [facility])     Patient Active Problem List   Diagnosis Date Noted  . Bipolar I disorder ([place]) 05/23/2017  . MDD (major depressive disorder), recurrent severe, without psychosis ([place]) 05/22/2017  . Major depressive disorder, recurrent episode, severe ([place]) 05/21/2017  . Cocaine use disorder, severe,  dependence ([place]) 05/21/2017  . Suicidal intent 05/20/2017  . Drug overdose 05/19/2017  . Family history of colon cancer 02/23/2017  . Rectal bleeding 02/23/2017  . Gastroesophageal reflux disease without esophagitis 02/23/2017  . Intractable vomiting with nausea 02/23/2017  . Abnormal thyroid blood test 01/02/2017  . Insomnia 01/02/2017  . Anxiety 01/02/2017  . IDDM (insulin dependent diabetes mellitus) ([place])   . Asthma 11/12/2015  . Acute respiratory failure ([place]) 11/11/2015  . TIA (transient ischemic attack) 11/01/2015  . Chronic diastolic CHF (congestive heart failure) ([place]) 11/01/2015  . Acute focal neurological deficit 11/01/2015  . Type 2 diabetes mellitus with circulatory disorder ([place])   . HLD (hyperlipidemia)   . Palpitations   . History of stroke 08/05/2015  . Hypertension 08/05/2015  . Stroke due to embolism of right posterior cerebral artery ([place]) 08/05/2015  . Diabetes mellitus 08/05/2015  . Alcohol dependency ([place]) 08/10/2013  . Cocaine abuse ([place]) 08/08/2013  . Alcohol abuse 08/08/2013  . MDD (major depressive disorder) 08/08/2013    Past Surgical History:  Procedure Laterality Date  . BREAST LUMPECTOMY    . EP IMPLANTABLE DEVICE N/A 08/07/2015   Procedure: Loop Recorder Insertion;  Surgeon: Thompson Grayer, MD;  Location: [facility] CV LAB;  Service: Cardiovascular;  Laterality: N/A;  . TEE WITHOUT CARDIOVERSION N/A 08/07/2015   Procedure: TRANSESOPHAGEAL ECHOCARDIOGRAM (TEE);  Surgeon: Sueanne Margarita, MD;  Location: [facility] ENDOSCOPY;  Service: Cardiovascular;  Laterality: N/A;  . TONSILLECTOMY  OB History    No data available       Home Medications    Prior to Admission medications   Medication Sig Start Date End Date Taking? Authorizing Provider  acidophilus (RISAQUAD) CAPS capsule Take 1 capsule by mouth daily.   Yes [provider]  albuterol (PROVENTIL HFA;VENTOLIN HFA) 108 (90 Base) MCG/ACT inhaler Inhale 2 puffs into the lungs every 6  (six) hours as needed for wheezing or shortness of breath. Shortness of breath 07/21/17  Yes Newlin, Charlane Ferretti, MD  Cholecalciferol (VITAMIN D PO) Take 1-2 tablets by mouth daily.   Yes [provider]  clopidogrel (PLAVIX) 75 MG tablet Take 1 tablet (75 mg total) by mouth daily. 07/21/17  Yes Charlott Rakes, MD  diltiazem (CARDIZEM CD) 360 MG 24 hr capsule Take 1 capsule (360 mg total) by mouth daily. 07/21/17  Yes Charlott Rakes, MD  DULoxetine (CYMBALTA) 60 MG capsule Take 1 capsule (60 mg total) by mouth daily. For mood control 07/21/17  Yes Newlin, Charlane Ferretti, MD  gabapentin (NEURONTIN) 300 MG capsule Take 1 capsule (300 mg total) by mouth 2 (two) times daily. 07/21/17  Yes Charlott Rakes, MD  glipiZIDE (GLUCOTROL) 5 MG tablet Take 1 tablet (5 mg total) by mouth 2 (two) times daily before a meal. 07/21/17  Yes Newlin, Enobong, MD  hydrALAZINE (APRESOLINE) 50 MG tablet Take 1 tablet (50 mg total) by mouth 3 (three) times daily. For High blood pressure 07/21/17  Yes Newlin, Enobong, MD  hydrochlorothiazide (MICROZIDE) 12.5 MG capsule Take 1 capsule (12.5 mg total) by mouth daily. 07/21/17  Yes Newlin, Charlane Ferretti, MD  insulin aspart (NOVOLOG) 100 UNIT/ML injection Sliding scale CBG 70 - 120: 0 units CBG 121 - 150: 1 unit,  CBG 151 - 200: 2 units,  CBG 201 - 250: 3 units,  CBG 251 - 300: 5 units,  CBG 301 - 350: 7 units,  CBG 351 - 400: 9 units   CBG > 400: 9 units and notify your MD 07/21/17  Yes Charlott Rakes, MD  insulin glargine (LANTUS) 100 UNIT/ML injection Inject 0.2 mLs (20 Units total) into the skin at bedtime. Patient taking differently: Inject 15 Units into the skin at bedtime.  07/21/17  Yes Charlott Rakes, MD  metFORMIN (GLUCOPHAGE) 1000 MG tablet Take 1 tablet (1,000 mg total) 2 (two) times daily with a meal by mouth. 08/02/17  Yes Tresa Garter, MD  OVER THE COUNTER MEDICATION Take 1-2 tablets by mouth daily. Vitafusion   Yes [provider]  potassium chloride SA  (K-DUR,KLOR-CON) 20 MEQ tablet Take 1 tablet (20 mEq total) by mouth daily. 07/21/17  Yes Charlott Rakes, MD  rosuvastatin (CRESTOR) 40 MG tablet Take 1 tablet (40 mg total) by mouth daily. 07/21/17  Yes Charlott Rakes, MD  traZODone (DESYREL) 50 MG tablet Take 1 tablet (50 mg total) by mouth at bedtime as needed for sleep. 07/21/17  Yes Charlott Rakes, MD  Blood Glucose Monitoring Suppl (CONTOUR NEXT MONITOR) w/Device KIT 1 strip 3 (three) times daily by Does not apply route. E11.9 08/07/17   Tresa Garter, MD  glucose blood (CONTOUR NEXT TEST) test strip Use as instructed 3 times daily. E11.9 08/07/17   Tresa Garter, MD  metoprolol tartrate (LOPRESSOR) 25 MG tablet Take 0.5 tablets (12.5 mg total) by mouth 2 (two) times daily. Patient not taking: Reported on 12/05/2017 09/12/17   Charlesetta Shanks, MD    Family History Family History  Problem Relation Age of Onset  . Diabetes Mother   .  Hyperlipidemia Mother   . Hypertension Mother   . Breast cancer Mother   . Rheum arthritis Father   . Asthma Father   . Stroke Father   . Thyroid disease Father   . Thyroid disease Brother   . Colon cancer Brother        in his 28s  . Breast cancer Sister     Social History Social History   Tobacco Use  . Smoking status: Current Every Day Smoker    Packs/day: 0.50    Types: Cigarettes  . Smokeless tobacco: Never Used  Substance Use Topics  . Alcohol use: Yes    Alcohol/week: 3.6 oz    Types: 6 Cans of beer per week    Comment: occasionally  . Drug use: No    Comment: last used 3 months ago.     Allergies   Prednisone; Benicar [olmesartan]; Celexa [citalopram]; Effexor [venlafaxine]; and Shrimp [shellfish allergy]   Review of Systems Review of Systems  Eyes: Negative for photophobia and visual disturbance.  Cardiovascular: Negative for chest pain and palpitations.  Neurological: Positive for speech difficulty, weakness (chronic) and headaches. Negative for syncope,  facial asymmetry, light-headedness and numbness.  All other systems reviewed and are negative.    Physical Exam Updated Vital Signs BP (!) 168/77   Pulse 76   Temp 98.1 F (36.7 C) (Oral)   Resp 14   Ht '5\' 5"'  (1.651 m)   Wt 72.6 kg (160 lb)   SpO2 98%   BMI 26.63 kg/m   Physical Exam  Constitutional: She is oriented to person, place, and time. She appears well-developed and well-nourished. No distress.  HENT:  Head: Normocephalic and atraumatic.  Eyes: Conjunctivae and EOM are normal. Pupils are equal, round, and reactive to light.  Neck: Normal range of motion. Neck supple.  Cardiovascular: Normal rate, regular rhythm, normal heart sounds and intact distal pulses.  Pulmonary/Chest: Effort normal and breath sounds normal. No respiratory distress.  Abdominal: Soft. Bowel sounds are normal. She exhibits no distension. There is no tenderness.  Musculoskeletal: Normal range of motion.  No LE edema or tenderness  Neurological: She is alert and oriented to person, place, and time.  Mental Status:  Alert, oriented, thought content appropriate, able to give a coherent history. Speech fluent without evidence of aphasia. Able to follow 2 step commands without difficulty.  Cranial Nerves:  II:  Peripheral visual fields grossly normal, pupils equal, round, reactive to light III,IV, VI: ptosis not present, extra-ocular motions intact bilaterally  V,VII: smile symmetric, facial light touch sensation equal VIII: hearing grossly normal to voice  X: uvula elevates symmetrically  XI: bilateral shoulder shrug symmetric and strong XII: midline tongue extension without fassiculations Motor:  Normal tone. 5/5 in upper and lower extremities bilaterally including strong and equal grip strength and dorsiflexion/plantar flexion Sensory: Pinprick and light touch normal in all extremities.  Deep Tendon Reflexes: 2+ and symmetric in the biceps and patella Cerebellar: normal finger-to-nose with  bilateral upper extremities Gait: normal gait and balance CV: distal pulses palpable throughout    Skin: Skin is warm.  Psychiatric: She has a normal mood and affect. Her behavior is normal.  Nursing note and vitals reviewed.    ED Treatments / Results  Labs (all labs ordered are listed, but only abnormal results are displayed) Labs Reviewed  CBC - Abnormal; Notable for the following components:      Result Value   WBC 12.7 (*)    All other components within normal  limits  COMPREHENSIVE METABOLIC PANEL - Abnormal; Notable for the following components:   Potassium 3.1 (*)    Glucose, Bld 154 (*)    Creatinine, Ser 1.07 (*)    GFR calc non Af Amer 55 (*)    All other components within normal limits  URINALYSIS, ROUTINE W REFLEX MICROSCOPIC - Abnormal; Notable for the following components:   Color, Urine STRAW (*)    Glucose, UA >=500 (*)    Squamous Epithelial / LPF 0-5 (*)    All other components within normal limits  APTT  PROTIME-INR  RAPID URINE DRUG SCREEN, HOSP PERFORMED    EKG  EKG Interpretation  Date/Time:  Tuesday December 05 2017 16:46:11 EDT Ventricular Rate:  75 PR Interval:    QRS Duration: 97 QT Interval:  425 QTC Calculation: 475 R Axis:   -30 Text Interpretation:  Sinus rhythm Left axis deviation Abnormal R-wave progression, early transition When compared to prior, no significant changes seen.  No STEMI Confirmed by Antony Blackbird [phone]) on 12/05/2017 9:44:37 PM       Radiology Ct Head Wo Contrast  Result Date: 12/05/2017 CLINICAL DATA:  4 minutes of aphasia. EXAM: CT HEAD WITHOUT CONTRAST TECHNIQUE: Contiguous axial images were obtained from the base of the skull through the vertex without intravenous contrast. COMPARISON:  11/13/2016 FINDINGS: Brain: Remote infarct at the junction of the right parietal and occipital lobes encephalomalacia. Chronic minimal small vessel ischemic disease of periventricular white matter. No large vascular territory  infarct, hemorrhage or midline shift. No intra-axial mass nor extra-axial fluid collections. No hydrocephalus. Partially empty pituitary sella. Vascular: Moderate atherosclerosis of the carotid siphons. Skull: Normal. Negative for fracture or focal lesion. Sinuses/Orbits: No acute finding. Other: None. IMPRESSION: 1. Remote right parietooccipital lobe infarct with encephalomalacia 2. Chronic minimal small vessel ischemic disease. 3. Chronic partially empty pituitary sella. 4. No acute intracranial abnormality Electronically Signed   By: Ashley Royalty M.D.   On: 12/05/2017 18:01   Mr Angiogram Neck W Or Wo Contrast  Result Date: 12/05/2017 CLINICAL DATA:  Follow-up TIA. 4 minutes episode of the aphasia. History of stroke, hypertension, hyperlipidemia, diabetes and breast cancer. EXAM: MRI HEAD WITHOUT CONTRAST MRA HEAD WITHOUT CONTRAST MRA NECK WITHOUT AND WITH CONTRAST TECHNIQUE: Multiplanar, multiecho pulse sequences of the brain and surrounding structures were obtained without intravenous contrast. Angiographic images of the Circle of Willis were obtained using MRA technique without intravenous contrast. Angiographic images of the neck were obtained using MRA technique without and with intravenous contrast. Carotid stenosis measurements (when applicable) are obtained utilizing NASCET criteria, using the distal internal carotid diameter as the denominator. CONTRAST:  87m MULTIHANCE GADOBENATE DIMEGLUMINE 529 MG/ML IV SOLN COMPARISON:  CT HEAD December 05, 2017 and MRA head January 20, 2017 and MRI/MRA head November 02, 2015 FINDINGS: MRI HEAD FINDINGS INTRACRANIAL CONTENTS: No reduced diffusion to suggest acute ischemia. No susceptibility artifact to suggest hemorrhage. Discontinuous RIGHT temporoparietal encephalomalacia. Mild ex vacuo dilatation RIGHT lateral ventricle, no overall parenchymal brain volume loss for age. Scattered subcentimeter supratentorial and patchy pontine white matter FLAIR T2 hyperintensities.  No suspicious parenchymal signal, masses, mass effect. No abnormal extra-axial fluid collections. No extra-axial masses. VASCULAR: Normal major intracranial vascular flow voids present at skull base. SKULL AND UPPER CERVICAL SPINE: Mild partially empty sella. No suspicious calvarial bone marrow signal. Craniocervical junction maintained. SINUSES/ORBITS: LEFT maxillary mucosal retention cysts without paranasal sinus air-fluid levels. Mild paranasal sinus mucosal thickening. Mastoid air cells are well aerated.The included ocular globes and orbital contents  are non-suspicious. OTHER: None. MRA HEAD FINDINGS ANTERIOR CIRCULATION: Normal flow related enhancement of the included cervical, petrous, cavernous and supraclinoid internal carotid arteries. 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 Patent anterior communicating artery. Patent anterior and middle cerebral arteries. Moderate stenosis proximal M1 segment, similar. Severe stenosis RIGHT M3 origin, unchanged. No large vessel occlusion, flow limiting stenosis, aneurysm. POSTERIOR CIRCULATION: LEFT vertebral artery is dominant. Severe stenosis distal RIGHT V4 segment. Basilar artery is patent, with normal flow related enhancement of the main branch vessels. Patent posterior cerebral arteries. Small LEFT posterior communicating artery present. No large vessel occlusion, flow limiting stenosis,  aneurysm. ANATOMIC VARIANTS: None. Source images and MIP images were reviewed. MRA NECK FINDINGS ANTERIOR CIRCULATION: The common carotid arteries are widely patent bilaterally. The carotid bifurcations are patent bilaterally without hemodynamically significant stenosis by NASCET criteria. No flow limiting stenosis or luminal irregularity carotid arteries. Severe stenosis LEFT subclavian artery distal to the LEFT vertebral artery takeoff. POSTERIOR CIRCULATION: Bilateral vertebral arteries are patent to the vertebrobasilar junction. No  flow limiting stenosis or luminal irregularity. Source images and MIP images were reviewed. IMPRESSION: MRI HEAD: 1. No acute intracranial process. 2. Multifocal old small RIGHT MCA territory infarcts. 3. Mild chronic small vessel ischemic disease. MRA HEAD: 1. No emergent large vessel occlusion. 2. Stable severe stenosis distal RIGHT V4 segment and RIGHT M3 origin. Moderate stenosis RIGHT M1 segment. 3. 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 MRA NECK: 1. No hemodynamically significant stenosis carotid artery's. 2. Severe stenosis LEFT subclavian artery distal to vertebral artery takeoff. Electronically Signed   By: Elon Alas M.D.   On: 12/05/2017 21:47   Mr Brain Wo Contrast (neuro Protocol)  Result Date: 12/05/2017 CLINICAL DATA:  Follow-up TIA. 4 minutes episode of the aphasia. History of stroke, hypertension, hyperlipidemia, diabetes and breast cancer. EXAM: MRI HEAD WITHOUT CONTRAST MRA HEAD WITHOUT CONTRAST MRA NECK WITHOUT AND WITH CONTRAST TECHNIQUE: Multiplanar, multiecho pulse sequences of the brain and surrounding structures were obtained without intravenous contrast. Angiographic images of the Circle of Willis were obtained using MRA technique without intravenous contrast. Angiographic images of the neck were obtained using MRA technique without and with intravenous contrast. Carotid stenosis measurements (when applicable) are obtained utilizing NASCET criteria, using the distal internal carotid diameter as the denominator. CONTRAST:  42m MULTIHANCE GADOBENATE DIMEGLUMINE 529 MG/ML IV SOLN COMPARISON:  CT HEAD December 05, 2017 and MRA head January 20, 2017 and MRI/MRA head November 02, 2015 FINDINGS: MRI HEAD FINDINGS INTRACRANIAL CONTENTS: No reduced diffusion to suggest acute ischemia. No susceptibility artifact to suggest hemorrhage. Discontinuous RIGHT temporoparietal encephalomalacia. Mild ex vacuo dilatation RIGHT lateral ventricle, no overall parenchymal brain volume loss for age.  Scattered subcentimeter supratentorial and patchy pontine white matter FLAIR T2 hyperintensities. No suspicious parenchymal signal, masses, mass effect. No abnormal extra-axial fluid collections. No extra-axial masses. VASCULAR: Normal major intracranial vascular flow voids present at skull base. SKULL AND UPPER CERVICAL SPINE: Mild partially empty sella. No suspicious calvarial bone marrow signal. Craniocervical junction maintained. SINUSES/ORBITS: LEFT maxillary mucosal retention cysts without paranasal sinus air-fluid levels. Mild paranasal sinus mucosal thickening. Mastoid air cells are well aerated.The included ocular globes and orbital contents are non-suspicious. OTHER: None. MRA HEAD FINDINGS ANTERIOR CIRCULATION: Normal flow related enhancement of the included cervical, petrous, cavernous and supraclinoid internal carotid arteries. 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 Patent anterior communicating artery. Patent anterior and middle cerebral arteries. Moderate stenosis proximal M1 segment, similar. Severe stenosis  RIGHT M3 origin, unchanged. No large vessel occlusion, flow limiting stenosis, aneurysm. POSTERIOR CIRCULATION: LEFT vertebral artery is dominant. Severe stenosis distal RIGHT V4 segment. Basilar artery is patent, with normal flow related enhancement of the main branch vessels. Patent posterior cerebral arteries. Small LEFT posterior communicating artery present. No large vessel occlusion, flow limiting stenosis,  aneurysm. ANATOMIC VARIANTS: None. Source images and MIP images were reviewed. MRA NECK FINDINGS ANTERIOR CIRCULATION: The common carotid arteries are widely patent bilaterally. The carotid bifurcations are patent bilaterally without hemodynamically significant stenosis by NASCET criteria. No flow limiting stenosis or luminal irregularity carotid arteries. Severe stenosis LEFT subclavian artery distal to the LEFT vertebral artery takeoff.  POSTERIOR CIRCULATION: Bilateral vertebral arteries are patent to the vertebrobasilar junction. No flow limiting stenosis or luminal irregularity. Source images and MIP images were reviewed. IMPRESSION: MRI HEAD: 1. No acute intracranial process. 2. Multifocal old small RIGHT MCA territory infarcts. 3. Mild chronic small vessel ischemic disease. MRA HEAD: 1. No emergent large vessel occlusion. 2. Stable severe stenosis distal RIGHT V4 segment and RIGHT M3 origin. Moderate stenosis RIGHT M1 segment. 3. 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 MRA NECK: 1. No hemodynamically significant stenosis carotid artery's. 2. Severe stenosis LEFT subclavian artery distal to vertebral artery takeoff. Electronically Signed   By: Elon Alas M.D.   On: 12/05/2017 21:47   Mr [name] Head (cerebral Arteries)  Result Date: 12/05/2017 CLINICAL DATA:  Follow-up TIA. 4 minutes episode of the aphasia. History of stroke, hypertension, hyperlipidemia, diabetes and breast cancer. EXAM: MRI HEAD WITHOUT CONTRAST MRA HEAD WITHOUT CONTRAST MRA NECK WITHOUT AND WITH CONTRAST TECHNIQUE: Multiplanar, multiecho pulse sequences of the brain and surrounding structures were obtained without intravenous contrast. Angiographic images of the Circle of Willis were obtained using MRA technique without intravenous contrast. Angiographic images of the neck were obtained using MRA technique without and with intravenous contrast. Carotid stenosis measurements (when applicable) are obtained utilizing NASCET criteria, using the distal internal carotid diameter as the denominator. CONTRAST:  83m MULTIHANCE GADOBENATE DIMEGLUMINE 529 MG/ML IV SOLN COMPARISON:  CT HEAD December 05, 2017 and MRA head January 20, 2017 and MRI/MRA head November 02, 2015 FINDINGS: MRI HEAD FINDINGS INTRACRANIAL CONTENTS: No reduced diffusion to suggest acute ischemia. No susceptibility artifact to suggest hemorrhage. Discontinuous RIGHT temporoparietal encephalomalacia. Mild ex vacuo  dilatation RIGHT lateral ventricle, no overall parenchymal brain volume loss for age. Scattered subcentimeter supratentorial and patchy pontine white matter FLAIR T2 hyperintensities. No suspicious parenchymal signal, masses, mass effect. No abnormal extra-axial fluid collections. No extra-axial masses. VASCULAR: Normal major intracranial vascular flow voids present at skull base. SKULL AND UPPER CERVICAL SPINE: Mild partially empty sella. No suspicious calvarial bone marrow signal. Craniocervical junction maintained. SINUSES/ORBITS: LEFT maxillary mucosal retention cysts without paranasal sinus air-fluid levels. Mild paranasal sinus mucosal thickening. Mastoid air cells are well aerated.The included ocular globes and orbital contents are non-suspicious. OTHER: None. MRA HEAD FINDINGS ANTERIOR CIRCULATION: Normal flow related enhancement of the included cervical, petrous, cavernous and supraclinoid internal carotid arteries. 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 Patent anterior communicating artery. Patent anterior and middle cerebral arteries. Moderate stenosis proximal M1 segment, similar. Severe stenosis RIGHT M3 origin, unchanged. No large vessel occlusion, flow limiting stenosis, aneurysm. POSTERIOR CIRCULATION: LEFT vertebral artery is dominant. Severe stenosis distal RIGHT V4 segment. Basilar artery is patent, with normal flow related enhancement of the main branch vessels. Patent posterior cerebral arteries. Small LEFT posterior communicating artery present. No large vessel occlusion, flow  limiting stenosis,  aneurysm. ANATOMIC VARIANTS: None. Source images and MIP images were reviewed. MRA NECK FINDINGS ANTERIOR CIRCULATION: The common carotid arteries are widely patent bilaterally. The carotid bifurcations are patent bilaterally without hemodynamically significant stenosis by NASCET criteria. No flow limiting stenosis or luminal irregularity carotid arteries.  Severe stenosis LEFT subclavian artery distal to the LEFT vertebral artery takeoff. POSTERIOR CIRCULATION: Bilateral vertebral arteries are patent to the vertebrobasilar junction. No flow limiting stenosis or luminal irregularity. Source images and MIP images were reviewed. IMPRESSION: MRI HEAD: 1. No acute intracranial process. 2. Multifocal old small RIGHT MCA territory infarcts. 3. Mild chronic small vessel ischemic disease. MRA HEAD: 1. No emergent large vessel occlusion. 2. Stable severe stenosis distal RIGHT V4 segment and RIGHT M3 origin. Moderate stenosis RIGHT M1 segment. 3. 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 MRA NECK: 1. No hemodynamically significant stenosis carotid artery's. 2. Severe stenosis LEFT subclavian artery distal to vertebral artery takeoff. Electronically Signed   By: Elon Alas M.D.   On: 12/05/2017 21:47    Procedures Procedures (including critical care time)  Medications Ordered in ED Medications  potassium chloride SA (K-DUR,KLOR-CON) CR tablet 40 mEq (not administered)  LORazepam (ATIVAN) injection 1 mg (1 mg Intravenous Given 12/05/17 1956)  gadobenate dimeglumine (MULTIHANCE) injection 15 mL (15 mLs Intravenous Contrast Given 12/05/17 2114)     Initial Impression / Assessment and Plan / ED Course  I have reviewed the triage vital signs and the nursing notes.  Pertinent labs & imaging results that were available during my care of the patient were reviewed by me and considered in my medical decision making (see chart for details).  Clinical Course as of Dec 05 2353  Tue Dec 05, 2017  1825 Spoke with Dr. Cheral Marker with neurology, who recommends MR brain, MRA, and MR neck w contrast. Will order further imaging and follow back up with neuro  [JR]  2304 Spoke with Dr. Lorraine Lax about MRI results. He will evaluate pt, though recommends obs overnight for possible TIA, given significant risk factors. Also requests UDS, and interrogate loop recorder  [JR]  2341 Dr.  Tamala Julian with [facility] accepting admission for TIA workup.  [JR]    Clinical Course User Index [JR] Keyen Marban, [place] N, PA-C   Pt presenting with episode of aphasia that lasted about 3-4 minutes around 3:25pm today. PMHx cortical CVA and TIA, with similar sx; on plavix daily. States she has not used cocaine in 6 months. Reports sx resolved, however has residual pressure-like HA. On exam, pt well-appearing, no focal neuro deficits. Heart in NSR. Mild leukocytosis. Mild hypokalemia; replaced with PO. CT head with remote R parietoccipital lobe infarct, no acute findings. Dr. Cheral Marker was consulted, who recommended MRI, MRA and MR neck w contrast.   MRI neg for acute pathology. Pt discussed with and evaluated by Dr. Lorraine Lax with neurology, who recommends admission for TIA workup, given patient's risk factors and significant PMHx. Pt agreeable to plan. Stable for admission.  Dr. Tamala Julian with [facility] accepting admission.  Patient discussed with Dr. Sherry Ruffing.  The patient appears reasonably stabilized for admission considering the current resources, flow, and capabilities available in the ED at this time, and I doubt any other [facility] requiring further screening and/or treatment in the ED prior to admission.  Final Clinical Impressions(s) / ED Diagnoses   Final diagnoses:  TIA (transient ischemic attack)    ED Discharge Orders    None       Kashmir Lysaght, [place] N, PA-C 12/05/17 2356  Tegeler, Gwenyth Allegra, MD 12/06/17 0230

## 2017-12-05 NOTE — ED Notes (Signed)
Dr. Aroor at bedside 

## 2017-12-05 NOTE — ED Triage Notes (Signed)
Pt here from home with c/o 4 mins of aphasia , pt is back to baseline on arrival , pt has hx of same

## 2017-12-05 NOTE — H&P (Signed)
History and Physical    Lisa Anderson RWE:[id number] DOB: 09-11-1956 DOA: 12/05/2017  Referring MD/NP/PA: Martinique Robinson, PA-C PCP: Charlott Rakes, MD  Patient coming from: home  Chief Complaint: "Unable to speak "  I have personally briefly reviewed patient's old medical records in [place]   HPI: Lisa Anderson is a 62 y.o. female with medical history significant of HTN,  HLD, IDDM, CVA/TIA, polysubstance abuse and history of, suicide attempt; who presents with complaints of having about 3-4 minutes inability to say anything while she was at a [facility] doctor's appointment around 3:25 pm.  Patient reports having a 10-15-minute period where her speech was slowed and slurred before returning back to baseline thereafter.  Associated symptoms include palpitations, diaphoresis, numbness intermittently in the left lower extremity, and intermittent leg swelling, and left achiness(noted as a similar symptoms during previous stroke).  Denies having any fever, chills, cough, chest pain, recent sick contacts, or medication changes.  Patient reports smoking half a pack cigarettes per day on average, but denies any recent cocaine use in the last 6 months.  ED Course: Upon admission to the emergency department patient was seen to be febrile, respirations 13-24, blood pressure 167/80-183/80, and O2 saturation maintained on room air.  Patient was brought in as a code stroke and was taken to the [facility] and evaluated by neurology.  Initial CT scan showed remote right parieto-occipital infarct with encephalomalacia and other chronic changes, no acute abnormalities.  Labs revealed WBC 12.7, potassium 3.1, and all other lab work relatively within normal limits.  Neurology recommended admission for completion of TIA workup as patient symptoms had since resolved.  Review of Systems  Constitutional: Positive for diaphoresis. Negative for chills, fever and weight loss.  HENT: Negative for ear discharge  and nosebleeds.   Eyes: Positive for pain. Negative for photophobia.  Respiratory: Positive for cough (mild intermittent). Negative for wheezing.   Cardiovascular: Positive for palpitations and leg swelling.  Gastrointestinal: Negative for abdominal pain and vomiting.  Genitourinary: Negative for dysuria and hematuria.  Musculoskeletal: Negative for falls and myalgias.  Skin: Negative for itching.  Neurological: Positive for speech change. Negative for focal weakness and loss of consciousness.  Endo/Heme/Allergies: Negative for polydipsia.  Psychiatric/Behavioral: Negative for substance abuse and suicidal ideas. The patient is nervous/anxious.     Past Medical History:  Diagnosis Date  . Asthma   . Cancer ([place])    breast  . Chronic congestive heart failure with left ventricular diastolic dysfunction ([place])   . Diabetes mellitus   . Hyperlipidemia   . Hypertension   . Stroke [facility])     Past Surgical History:  Procedure Laterality Date  . BREAST LUMPECTOMY    . EP IMPLANTABLE DEVICE N/A 08/07/2015   Procedure: Loop Recorder Insertion;  Surgeon: Thompson Grayer, MD;  Location: [facility] CV LAB;  Service: Cardiovascular;  Laterality: N/A;  . TEE WITHOUT CARDIOVERSION N/A 08/07/2015   Procedure: TRANSESOPHAGEAL ECHOCARDIOGRAM (TEE);  Surgeon: Sueanne Margarita, MD;  Location: [facility] ENDOSCOPY;  Service: Cardiovascular;  Laterality: N/A;  . TONSILLECTOMY       reports that she has been smoking cigarettes.  She has been smoking about 0.50 packs per day. she has never used smokeless tobacco. She reports that she drinks about 3.6 oz of alcohol per week. She reports that she does not use drugs.  Allergies  Allergen Reactions  . Prednisone Hives and Other (See Comments)    Made her "crazy", suicidal  . Benicar [Olmesartan] Other (See Comments)  Did not work at all for patient  . Celexa [Citalopram] Other (See Comments)    Made patient feel crazy, fluoxetine is fine  . Effexor [Venlafaxine]  Other (See Comments)    Made patient feel crazy  . Shrimp [Shellfish Allergy] Hives and Swelling    Family History  Problem Relation Age of Onset  . Diabetes Mother   . Hyperlipidemia Mother   . Hypertension Mother   . Breast cancer Mother   . Rheum arthritis Father   . Asthma Father   . Stroke Father   . Thyroid disease Father   . Thyroid disease Brother   . Colon cancer Brother        in his 64s  . Breast cancer Sister     Prior to Admission medications   Medication Sig Start Date End Date Taking? Authorizing Provider  acidophilus (RISAQUAD) CAPS capsule Take 1 capsule by mouth daily.   Yes [provider]  albuterol (PROVENTIL HFA;VENTOLIN HFA) 108 (90 Base) MCG/ACT inhaler Inhale 2 puffs into the lungs every 6 (six) hours as needed for wheezing or shortness of breath. Shortness of breath 07/21/17  Yes Newlin, Charlane Ferretti, MD  Cholecalciferol (VITAMIN D PO) Take 1-2 tablets by mouth daily.   Yes [provider]  clopidogrel (PLAVIX) 75 MG tablet Take 1 tablet (75 mg total) by mouth daily. 07/21/17  Yes Charlott Rakes, MD  diltiazem (CARDIZEM CD) 360 MG 24 hr capsule Take 1 capsule (360 mg total) by mouth daily. 07/21/17  Yes Charlott Rakes, MD  DULoxetine (CYMBALTA) 60 MG capsule Take 1 capsule (60 mg total) by mouth daily. For mood control 07/21/17  Yes Newlin, Charlane Ferretti, MD  gabapentin (NEURONTIN) 300 MG capsule Take 1 capsule (300 mg total) by mouth 2 (two) times daily. 07/21/17  Yes Charlott Rakes, MD  glipiZIDE (GLUCOTROL) 5 MG tablet Take 1 tablet (5 mg total) by mouth 2 (two) times daily before a meal. 07/21/17  Yes Newlin, Enobong, MD  hydrALAZINE (APRESOLINE) 50 MG tablet Take 1 tablet (50 mg total) by mouth 3 (three) times daily. For High blood pressure 07/21/17  Yes Newlin, Enobong, MD  hydrochlorothiazide (MICROZIDE) 12.5 MG capsule Take 1 capsule (12.5 mg total) by mouth daily. 07/21/17  Yes Newlin, Charlane Ferretti, MD  insulin aspart (NOVOLOG) 100 UNIT/ML  injection Sliding scale CBG 70 - 120: 0 units CBG 121 - 150: 1 unit,  CBG 151 - 200: 2 units,  CBG 201 - 250: 3 units,  CBG 251 - 300: 5 units,  CBG 301 - 350: 7 units,  CBG 351 - 400: 9 units   CBG > 400: 9 units and notify your MD 07/21/17  Yes Charlott Rakes, MD  insulin glargine (LANTUS) 100 UNIT/ML injection Inject 0.2 mLs (20 Units total) into the skin at bedtime. Patient taking differently: Inject 15 Units into the skin at bedtime.  07/21/17  Yes Charlott Rakes, MD  metFORMIN (GLUCOPHAGE) 1000 MG tablet Take 1 tablet (1,000 mg total) 2 (two) times daily with a meal by mouth. 08/02/17  Yes Tresa Garter, MD  OVER THE COUNTER MEDICATION Take 1-2 tablets by mouth daily. Vitafusion   Yes [provider]  potassium chloride SA (K-DUR,KLOR-CON) 20 MEQ tablet Take 1 tablet (20 mEq total) by mouth daily. 07/21/17  Yes Charlott Rakes, MD  rosuvastatin (CRESTOR) 40 MG tablet Take 1 tablet (40 mg total) by mouth daily. 07/21/17  Yes Charlott Rakes, MD  traZODone (DESYREL) 50 MG tablet Take 1 tablet (50 mg total) by mouth  at bedtime as needed for sleep. 07/21/17  Yes Charlott Rakes, MD  Blood Glucose Monitoring Suppl (CONTOUR NEXT MONITOR) w/Device KIT 1 strip 3 (three) times daily by Does not apply route. E11.9 08/07/17   Tresa Garter, MD  glucose blood (CONTOUR NEXT TEST) test strip Use as instructed 3 times daily. E11.9 08/07/17   Tresa Garter, MD  metoprolol tartrate (LOPRESSOR) 25 MG tablet Take 0.5 tablets (12.5 mg total) by mouth 2 (two) times daily. Patient not taking: Reported on 12/05/2017 09/12/17   Charlesetta Shanks, MD    Physical Exam:  Constitutional: NAD, calm, comfortable Vitals:   12/05/17 1830 12/05/17 1955 12/05/17 2254 12/05/17 2257  BP: (!) 180/75 (!) 171/76 (!) 168/77   Pulse: 77 79 80 76  Resp: 18 (!) 24  14  Temp:      TempSrc:      SpO2: 98% 100% 99% 98%  Weight:      Height:       Eyes: PERRL, lids and conjunctivae normal ENMT:  Mucous membranes are moist. Posterior pharynx clear of any exudate or lesions.Normal dentition.  Neck: normal, supple, no masses, no thyromegaly Respiratory: clear to auscultation bilaterally, no wheezing, no crackles. Normal respiratory effort. No accessory muscle use.  Cardiovascular: Regular rate and rhythm, no murmurs / rubs / gallops. No extremity edema. 2+ pedal pulses. No carotid bruits.  Abdomen: no tenderness, no masses palpated. No hepatosplenomegaly. Bowel sounds positive.  Musculoskeletal: no clubbing / cyanosis. No joint deformity upper and lower extremities. Good ROM, no contractures. Normal muscle tone.  Skin: no rashes, lesions, ulcers. No induration Neurologic: CN 2-12 grossly intact. Sensation intact, DTR normal. Strength 5/5 in all 4.  Psychiatric: Normal judgment and insight. Alert and oriented x 3. Normal mood.     Labs on Admission: I have personally reviewed following labs and imaging studies  CBC: Recent Labs  Lab 12/05/17 1624  WBC 12.7*  HGB 12.9  HCT 41.6  MCV 88.7  PLT 277   Basic Metabolic Panel: Recent Labs  Lab 12/05/17 1624  NA 140  K 3.1*  CL 103  CO2 26  GLUCOSE 154*  BUN 9  CREATININE 1.07*  CALCIUM 9.0   GFR: Estimated Creatinine Clearance: 55.8 mL/min (A) (by C-G formula based on SCr of 1.07 mg/dL (H)). Liver Function Tests: Recent Labs  Lab 12/05/17 1624  AST 21  ALT 19  ALKPHOS 96  BILITOT 0.3  PROT 6.9  ALBUMIN 3.8   No results for input(s): LIPASE, AMYLASE in the last 168 hours. No results for input(s): AMMONIA in the last 168 hours. Coagulation Profile: Recent Labs  Lab 12/05/17 1624  INR 1.00   Cardiac Enzymes: No results for input(s): CKTOTAL, CKMB, CKMBINDEX, TROPONINI in the last 168 hours. BNP (last 3 results) Recent Labs    12/12/16 1314  PROBNP 31   HbA1C: No results for input(s): HGBA1C in the last 72 hours. CBG: No results for input(s): GLUCAP in the last 168 hours. Lipid Profile: No results for  input(s): CHOL, HDL, LDLCALC, TRIG, CHOLHDL, LDLDIRECT in the last 72 hours. Thyroid Function Tests: No results for input(s): TSH, T4TOTAL, FREET4, T3FREE, THYROIDAB in the last 72 hours. Anemia Panel: No results for input(s): VITAMINB12, FOLATE, FERRITIN, TIBC, IRON, RETICCTPCT in the last 72 hours. Urine analysis:    Component Value Date/Time   COLORURINE STRAW (A) 12/05/2017 1654   APPEARANCEUR CLEAR 12/05/2017 1654   LABSPEC 1.017 12/05/2017 1654   PHURINE 6.0 12/05/2017 1654   GLUCOSEU >=500 (  A) 12/05/2017 1654   HGBUR NEGATIVE 12/05/2017 1654   BILIRUBINUR NEGATIVE 12/05/2017 1654   KETONESUR NEGATIVE 12/05/2017 1654   PROTEINUR NEGATIVE 12/05/2017 1654   UROBILINOGEN 0.2 10/10/2017 1418   NITRITE NEGATIVE 12/05/2017 1654   LEUKOCYTESUR NEGATIVE 12/05/2017 1654   Sepsis Labs: No results found for this or any previous visit (from the past 240 hour(s)).   Radiological Exams on Admission: Ct Head Wo Contrast  Result Date: 12/05/2017 CLINICAL DATA:  4 minutes of aphasia. EXAM: CT HEAD WITHOUT CONTRAST TECHNIQUE: Contiguous axial images were obtained from the base of the skull through the vertex without intravenous contrast. COMPARISON:  11/13/2016 FINDINGS: Brain: Remote infarct at the junction of the right parietal and occipital lobes encephalomalacia. Chronic minimal small vessel ischemic disease of periventricular white matter. No large vascular territory infarct, hemorrhage or midline shift. No intra-axial mass nor extra-axial fluid collections. No hydrocephalus. Partially empty pituitary sella. Vascular: Moderate atherosclerosis of the carotid siphons. Skull: Normal. Negative for fracture or focal lesion. Sinuses/Orbits: No acute finding. Other: None. IMPRESSION: 1. Remote right parietooccipital lobe infarct with encephalomalacia 2. Chronic minimal small vessel ischemic disease. 3. Chronic partially empty pituitary sella. 4. No acute intracranial abnormality Electronically Signed    By: Ashley Royalty M.D.   On: 12/05/2017 18:01   Mr Angiogram Neck W Or Wo Contrast  Result Date: 12/05/2017 CLINICAL DATA:  Follow-up TIA. 4 minutes episode of the aphasia. History of stroke, hypertension, hyperlipidemia, diabetes and breast cancer. EXAM: MRI HEAD WITHOUT CONTRAST MRA HEAD WITHOUT CONTRAST MRA NECK WITHOUT AND WITH CONTRAST TECHNIQUE: Multiplanar, multiecho pulse sequences of the brain and surrounding structures were obtained without intravenous contrast. Angiographic images of the Circle of Willis were obtained using MRA technique without intravenous contrast. Angiographic images of the neck were obtained using MRA technique without and with intravenous contrast. Carotid stenosis measurements (when applicable) are obtained utilizing NASCET criteria, using the distal internal carotid diameter as the denominator. CONTRAST:  54m MULTIHANCE GADOBENATE DIMEGLUMINE 529 MG/ML IV SOLN COMPARISON:  CT HEAD December 05, 2017 and MRA head January 20, 2017 and MRI/MRA head November 02, 2015 FINDINGS: MRI HEAD FINDINGS INTRACRANIAL CONTENTS: No reduced diffusion to suggest acute ischemia. No susceptibility artifact to suggest hemorrhage. Discontinuous RIGHT temporoparietal encephalomalacia. Mild ex vacuo dilatation RIGHT lateral ventricle, no overall parenchymal brain volume loss for age. Scattered subcentimeter supratentorial and patchy pontine white matter FLAIR T2 hyperintensities. No suspicious parenchymal signal, masses, mass effect. No abnormal extra-axial fluid collections. No extra-axial masses. VASCULAR: Normal major intracranial vascular flow voids present at skull base. SKULL AND UPPER CERVICAL SPINE: Mild partially empty sella. No suspicious calvarial bone marrow signal. Craniocervical junction maintained. SINUSES/ORBITS: LEFT maxillary mucosal retention cysts without paranasal sinus air-fluid levels. Mild paranasal sinus mucosal thickening. Mastoid air cells are well aerated.The included ocular globes  and orbital contents are non-suspicious. OTHER: None. MRA HEAD FINDINGS ANTERIOR CIRCULATION: Normal flow related enhancement of the included cervical, petrous, cavernous and supraclinoid internal carotid arteries. 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 Patent anterior communicating artery. Patent anterior and middle cerebral arteries. Moderate stenosis proximal M1 segment, similar. Severe stenosis RIGHT M3 origin, unchanged. No large vessel occlusion, flow limiting stenosis, aneurysm. POSTERIOR CIRCULATION: LEFT vertebral artery is dominant. Severe stenosis distal RIGHT V4 segment. Basilar artery is patent, with normal flow related enhancement of the main branch vessels. Patent posterior cerebral arteries. Small LEFT posterior communicating artery present. No large vessel occlusion, flow limiting stenosis,  aneurysm. ANATOMIC VARIANTS: None. Source images  and MIP images were reviewed. MRA NECK FINDINGS ANTERIOR CIRCULATION: The common carotid arteries are widely patent bilaterally. The carotid bifurcations are patent bilaterally without hemodynamically significant stenosis by NASCET criteria. No flow limiting stenosis or luminal irregularity carotid arteries. Severe stenosis LEFT subclavian artery distal to the LEFT vertebral artery takeoff. POSTERIOR CIRCULATION: Bilateral vertebral arteries are patent to the vertebrobasilar junction. No flow limiting stenosis or luminal irregularity. Source images and MIP images were reviewed. IMPRESSION: MRI HEAD: 1. No acute intracranial process. 2. Multifocal old small RIGHT MCA territory infarcts. 3. Mild chronic small vessel ischemic disease. MRA HEAD: 1. No emergent large vessel occlusion. 2. Stable severe stenosis distal RIGHT V4 segment and RIGHT M3 origin. Moderate stenosis RIGHT M1 segment. 3. 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 MRA NECK: 1. No hemodynamically significant stenosis carotid artery's. 2. Severe stenosis  LEFT subclavian artery distal to vertebral artery takeoff. Electronically Signed   By: Elon Alas M.D.   On: 12/05/2017 21:47   Mr Brain Wo Contrast (neuro Protocol)  Result Date: 12/05/2017 CLINICAL DATA:  Follow-up TIA. 4 minutes episode of the aphasia. History of stroke, hypertension, hyperlipidemia, diabetes and breast cancer. EXAM: MRI HEAD WITHOUT CONTRAST MRA HEAD WITHOUT CONTRAST MRA NECK WITHOUT AND WITH CONTRAST TECHNIQUE: Multiplanar, multiecho pulse sequences of the brain and surrounding structures were obtained without intravenous contrast. Angiographic images of the Circle of Willis were obtained using MRA technique without intravenous contrast. Angiographic images of the neck were obtained using MRA technique without and with intravenous contrast. Carotid stenosis measurements (when applicable) are obtained utilizing NASCET criteria, using the distal internal carotid diameter as the denominator. CONTRAST:  37m MULTIHANCE GADOBENATE DIMEGLUMINE 529 MG/ML IV SOLN COMPARISON:  CT HEAD December 05, 2017 and MRA head January 20, 2017 and MRI/MRA head November 02, 2015 FINDINGS: MRI HEAD FINDINGS INTRACRANIAL CONTENTS: No reduced diffusion to suggest acute ischemia. No susceptibility artifact to suggest hemorrhage. Discontinuous RIGHT temporoparietal encephalomalacia. Mild ex vacuo dilatation RIGHT lateral ventricle, no overall parenchymal brain volume loss for age. Scattered subcentimeter supratentorial and patchy pontine white matter FLAIR T2 hyperintensities. No suspicious parenchymal signal, masses, mass effect. No abnormal extra-axial fluid collections. No extra-axial masses. VASCULAR: Normal major intracranial vascular flow voids present at skull base. SKULL AND UPPER CERVICAL SPINE: Mild partially empty sella. No suspicious calvarial bone marrow signal. Craniocervical junction maintained. SINUSES/ORBITS: LEFT maxillary mucosal retention cysts without paranasal sinus air-fluid levels. Mild  paranasal sinus mucosal thickening. Mastoid air cells are well aerated.The included ocular globes and orbital contents are non-suspicious. OTHER: None. MRA HEAD FINDINGS ANTERIOR CIRCULATION: Normal flow related enhancement of the included cervical, petrous, cavernous and supraclinoid internal carotid arteries. 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 Patent anterior communicating artery. Patent anterior and middle cerebral arteries. Moderate stenosis proximal M1 segment, similar. Severe stenosis RIGHT M3 origin, unchanged. No large vessel occlusion, flow limiting stenosis, aneurysm. POSTERIOR CIRCULATION: LEFT vertebral artery is dominant. Severe stenosis distal RIGHT V4 segment. Basilar artery is patent, with normal flow related enhancement of the main branch vessels. Patent posterior cerebral arteries. Small LEFT posterior communicating artery present. No large vessel occlusion, flow limiting stenosis,  aneurysm. ANATOMIC VARIANTS: None. Source images and MIP images were reviewed. MRA NECK FINDINGS ANTERIOR CIRCULATION: The common carotid arteries are widely patent bilaterally. The carotid bifurcations are patent bilaterally without hemodynamically significant stenosis by NASCET criteria. No flow limiting stenosis or luminal irregularity carotid arteries. Severe stenosis LEFT subclavian artery distal to the LEFT vertebral artery takeoff. POSTERIOR  CIRCULATION: Bilateral vertebral arteries are patent to the vertebrobasilar junction. No flow limiting stenosis or luminal irregularity. Source images and MIP images were reviewed. IMPRESSION: MRI HEAD: 1. No acute intracranial process. 2. Multifocal old small RIGHT MCA territory infarcts. 3. Mild chronic small vessel ischemic disease. MRA HEAD: 1. No emergent large vessel occlusion. 2. Stable severe stenosis distal RIGHT V4 segment and RIGHT M3 origin. Moderate stenosis RIGHT M1 segment. 3. Stable 2 mm RIGHT PCOM origin  aneurysm. MRA NECK: 1. No hemodynamically significant stenosis carotid artery's. 2. Severe stenosis LEFT subclavian artery distal to vertebral artery takeoff. Electronically Signed   By: Elon Alas M.D.   On: 12/05/2017 21:47   Mr [name] Head (cerebral Arteries)  Result Date: 12/05/2017 CLINICAL DATA:  Follow-up TIA. 4 minutes episode of the aphasia. History of stroke, hypertension, hyperlipidemia, diabetes and breast cancer. EXAM: MRI HEAD WITHOUT CONTRAST MRA HEAD WITHOUT CONTRAST MRA NECK WITHOUT AND WITH CONTRAST TECHNIQUE: Multiplanar, multiecho pulse sequences of the brain and surrounding structures were obtained without intravenous contrast. Angiographic images of the Circle of Willis were obtained using MRA technique without intravenous contrast. Angiographic images of the neck were obtained using MRA technique without and with intravenous contrast. Carotid stenosis measurements (when applicable) are obtained utilizing NASCET criteria, using the distal internal carotid diameter as the denominator. CONTRAST:  15m MULTIHANCE GADOBENATE DIMEGLUMINE 529 MG/ML IV SOLN COMPARISON:  CT HEAD December 05, 2017 and MRA head January 20, 2017 and MRI/MRA head November 02, 2015 FINDINGS: MRI HEAD FINDINGS INTRACRANIAL CONTENTS: No reduced diffusion to suggest acute ischemia. No susceptibility artifact to suggest hemorrhage. Discontinuous RIGHT temporoparietal encephalomalacia. Mild ex vacuo dilatation RIGHT lateral ventricle, no overall parenchymal brain volume loss for age. Scattered subcentimeter supratentorial and patchy pontine white matter FLAIR T2 hyperintensities. No suspicious parenchymal signal, masses, mass effect. No abnormal extra-axial fluid collections. No extra-axial masses. VASCULAR: Normal major intracranial vascular flow voids present at skull base. SKULL AND UPPER CERVICAL SPINE: Mild partially empty sella. No suspicious calvarial bone marrow signal. Craniocervical junction maintained.  SINUSES/ORBITS: LEFT maxillary mucosal retention cysts without paranasal sinus air-fluid levels. Mild paranasal sinus mucosal thickening. Mastoid air cells are well aerated.The included ocular globes and orbital contents are non-suspicious. OTHER: None. MRA HEAD FINDINGS ANTERIOR CIRCULATION: Normal flow related enhancement of the included cervical, petrous, cavernous and supraclinoid internal carotid arteries. 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 Patent anterior communicating artery. Patent anterior and middle cerebral arteries. Moderate stenosis proximal M1 segment, similar. Severe stenosis RIGHT M3 origin, unchanged. No large vessel occlusion, flow limiting stenosis, aneurysm. POSTERIOR CIRCULATION: LEFT vertebral artery is dominant. Severe stenosis distal RIGHT V4 segment. Basilar artery is patent, with normal flow related enhancement of the main branch vessels. Patent posterior cerebral arteries. Small LEFT posterior communicating artery present. No large vessel occlusion, flow limiting stenosis,  aneurysm. ANATOMIC VARIANTS: None. Source images and MIP images were reviewed. MRA NECK FINDINGS ANTERIOR CIRCULATION: The common carotid arteries are widely patent bilaterally. The carotid bifurcations are patent bilaterally without hemodynamically significant stenosis by NASCET criteria. No flow limiting stenosis or luminal irregularity carotid arteries. Severe stenosis LEFT subclavian artery distal to the LEFT vertebral artery takeoff. POSTERIOR CIRCULATION: Bilateral vertebral arteries are patent to the vertebrobasilar junction. No flow limiting stenosis or luminal irregularity. Source images and MIP images were reviewed. IMPRESSION: MRI HEAD: 1. No acute intracranial process. 2. Multifocal old small RIGHT MCA territory infarcts. 3. Mild chronic small vessel ischemic disease. MRA HEAD: 1. No emergent large vessel  occlusion. 2. Stable severe stenosis distal RIGHT V4  segment and RIGHT M3 origin. Moderate stenosis RIGHT M1 segment. 3. 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 MRA NECK: 1. No hemodynamically significant stenosis carotid artery's. 2. Severe stenosis LEFT subclavian artery distal to vertebral artery takeoff. Electronically Signed   By: Elon Alas M.D.   On: 12/05/2017 21:47    EKG: Independently reviewed. Normal sinus rhythm at 74 bpm with abnormal R wave progression   Assessment/Plan Aphasia, suspected TIA: Acute.  Patient reports inability to speak for approximately 4 minutes.  Imaging studies including CT, MRI, and MRA per neurology for any acute abnormality.  Recommending admission for TIA workup. - Admit to telemetry bed - Stroke order set initiated - Neuro checks - Follow-up drug screen - Check echocardiogram - Check Hemoglobin A1c and lipid panel in a.m. - Continue ASA and Plavix - Appreciate neurology consultative services, will follow-up   Leukocytosis: Acute.  WBC elevated at 12.7 on admission.  Urinalysis is negative for any signs of infection.  No clear source of infection noted at this time. - Continue to monitor  Hypokalemia: Acute initial potassium noted to be 3.1 on admission. - Give 40 mEq x1 dose now - Continue home supplemental dose and adjust as needed  Diabetes mellitus type 2 - Hypoglycemic protocol - Held glipizide and metformin - Continue patient home long-acting insulin regimen - CBGs q. before meals and at bedtime with sensitive SSI  Essential hypertension - Continue Cardizem, hydrochlorothiazide, and hydralazine  H/O CVA   Hyperlipidemia - Continue Crestor  Polysubstance abuse: Patient admits to daily use of half a pack of cigarettes, but reports no use of cocaine in the last 6 months. - Nicotine patch - Follow-up urine drug screen  DVT prophylaxis:  lovenox Code Status: full  Family Communication: No family present at bedside Disposition Plan: Likely discharge home once medically  stable Consults called: Neurology  Admission status:observation  Norval Morton MD [facility] Pager [phone]   If 7PM-7AM, please contact night-coverage [email or web address] Password [email or web address]  12/05/2017, 11:40 PM

## 2017-12-05 NOTE — Consult Note (Addendum)
Requesting Physician: Dr. Sherry Ruffing    Chief Complaint: Aphasia  History obtained from: Patient and Chart    HPI:                                                                                                                                       Lisa Anderson is an 62 y.o. female with PMH of Stroke, HTN, HLD, DM, and breast cancer, prior cocaine abuse, tobacco abuse presents with 4 min episode of unable to get words out of her mouth. Patient was normal until around 3 pm she noted for about 4-5 minutes she could think of the words but unable to say them followed by some slurred speech. Her symptoms resolved by the time she arrived in the ER.   She has prior history of right parieto-occipital cortical infarct embolic secondary to unknown source.( ? Vs ICAD)  She has been on ASA and Plavix, however missed her ASA. She states she has not used cocaine since 6 months but continues to use tobacco.    Date last known well: 3.12.19 Time last known well: 3pm tPA Given: no, symptoms resolved NIHSS: 0 Baseline MRS 0    Past Medical History:  Diagnosis Date  . Asthma   . Cancer ([place])    breast  . Chronic congestive heart failure with left ventricular diastolic dysfunction ([place])   . Diabetes mellitus   . Hyperlipidemia   . Hypertension   . Stroke [facility])     Past Surgical History:  Procedure Laterality Date  . BREAST LUMPECTOMY    . EP IMPLANTABLE DEVICE N/A 08/07/2015   Procedure: Loop Recorder Insertion;  Surgeon: Thompson Grayer, MD;  Location: [place] [facility];  Service: Cardiovascular;  Laterality: N/A;  . TEE WITHOUT CARDIOVERSION N/A 08/07/2015   Procedure: TRANSESOPHAGEAL ECHOCARDIOGRAM (TEE);  Surgeon: Sueanne Margarita, MD;  Location: [facility] ENDOSCOPY;  Service: Cardiovascular;  Laterality: N/A;  . TONSILLECTOMY      Family History  Problem Relation Age of Onset  . Diabetes Mother   . Hyperlipidemia Mother   . Hypertension Mother   . Breast cancer Mother   . Rheum arthritis Father    . Asthma Father   . Stroke Father   . Thyroid disease Father   . Thyroid disease Brother   . Colon cancer Brother        in his 55s  . Breast cancer Sister    Social History:  reports that she has been smoking cigarettes.  She has been smoking about 0.50 packs per day. she has never used smokeless tobacco. She reports that she drinks about 3.6 oz of alcohol per week. She reports that she does not use drugs.  Allergies:  Allergies  Allergen Reactions  . Prednisone Hives and Other (See Comments)    Made her "crazy", suicidal  . Benicar [Olmesartan] Other (See Comments)    Did not work at all for  patient  . Celexa [Citalopram] Other (See Comments)    Made patient feel crazy, fluoxetine is fine  . Effexor [Venlafaxine] Other (See Comments)    Made patient feel crazy  . Shrimp [Shellfish Allergy] Hives and Swelling    Medications:                                                                                                                        I reviewed home medications   ROS:                                                                                                                                     14 systems reviewed and negative except above    Examination:                                                                                                      General: Appears well-developed and well-nourished.  Psych: Affect appropriate to situation Eyes: No scleral injection HENT: No OP obstrucion Head: Normocephalic.  Cardiovascular: Normal rate and regular rhythm.  Respiratory: Effort normal and breath sounds normal to anterior ascultation GI: Soft.  No distension. There is no tenderness.  Skin: WDI   Neurological Examination Mental Status: Alert, oriented, thought content appropriate.  Speech fluent without evidence of aphasia. Able to follow 3 step commands without difficulty. Cranial Nerves: II: Visual fields grossly normal,  III,IV, VI: ptosis not  present, extra-ocular motions intact bilaterally, pupils equal, round, reactive to light and accommodation V,VII: smile symmetric, facial light touch sensation normal bilaterally VIII: hearing normal bilaterally IX,X: uvula rises symmetrically XI: bilateral shoulder shrug XII: midline tongue extension Motor: Right : Upper extremity   5/5    Left:     Upper extremity   5/5  Lower extremity   5/5     Lower extremity   5/5 Tone and bulk:normal tone throughout; no atrophy noted Sensory: Pinprick and light touch intact throughout, bilaterally Deep Tendon Reflexes: 2+ and symmetric  throughout Plantars: Right: downgoing   Left: downgoing Cerebellar: normal finger-to-nose, normal rapid alternating movements and normal heel-to-shin test Gait: normal gait and station     Lab Results: Basic Metabolic Panel: Recent Labs  Lab 12/05/17 1624  NA 140  K 3.1*  CL 103  CO2 26  GLUCOSE 154*  BUN 9  CREATININE 1.07*  CALCIUM 9.0    CBC: Recent Labs  Lab 12/05/17 1624  WBC 12.7*  HGB 12.9  HCT 41.6  MCV 88.7  PLT 297    Coagulation Studies: Recent Labs    12/05/17 1624  LABPROT 13.1  INR 1.00    Imaging: Ct Head Wo Contrast  Result Date: 12/05/2017 CLINICAL DATA:  4 minutes of aphasia. EXAM: CT HEAD WITHOUT CONTRAST TECHNIQUE: Contiguous axial images were obtained from the base of the skull through the vertex without intravenous contrast. COMPARISON:  11/13/2016 FINDINGS: Brain: Remote infarct at the junction of the right parietal and occipital lobes encephalomalacia. Chronic minimal small vessel ischemic disease of periventricular white matter. No large vascular territory infarct, hemorrhage or midline shift. No intra-axial mass nor extra-axial fluid collections. No hydrocephalus. Partially empty pituitary sella. Vascular: Moderate atherosclerosis of the carotid siphons. Skull: Normal. Negative for fracture or focal lesion. Sinuses/Orbits: No acute finding. Other: None.  IMPRESSION: 1. Remote right parietooccipital lobe infarct with encephalomalacia 2. Chronic minimal small vessel ischemic disease. 3. Chronic partially empty pituitary sella. 4. No acute intracranial abnormality Electronically Signed   By: Ashley Royalty M.D.   On: 12/05/2017 18:01   Mr Angiogram Neck W Or Wo Contrast  Result Date: 12/05/2017 CLINICAL DATA:  Follow-up TIA. 4 minutes episode of the aphasia. History of stroke, hypertension, hyperlipidemia, diabetes and breast cancer. EXAM: MRI HEAD WITHOUT CONTRAST MRA HEAD WITHOUT CONTRAST MRA NECK WITHOUT AND WITH CONTRAST TECHNIQUE: Multiplanar, multiecho pulse sequences of the brain and surrounding structures were obtained without intravenous contrast. Angiographic images of the Circle of Willis were obtained using MRA technique without intravenous contrast. Angiographic images of the neck were obtained using MRA technique without and with intravenous contrast. Carotid stenosis measurements (when applicable) are obtained utilizing NASCET criteria, using the distal internal carotid diameter as the denominator. CONTRAST:  1mL MULTIHANCE GADOBENATE DIMEGLUMINE 529 MG/ML IV SOLN COMPARISON:  CT HEAD December 05, 2017 and MRA head January 20, 2017 and MRI/MRA head November 02, 2015 FINDINGS: MRI HEAD FINDINGS INTRACRANIAL CONTENTS: No reduced diffusion to suggest acute ischemia. No susceptibility artifact to suggest hemorrhage. Discontinuous RIGHT temporoparietal encephalomalacia. Mild ex vacuo dilatation RIGHT lateral ventricle, no overall parenchymal brain volume loss for age. Scattered subcentimeter supratentorial and patchy pontine white matter FLAIR T2 hyperintensities. No suspicious parenchymal signal, masses, mass effect. No abnormal extra-axial fluid collections. No extra-axial masses. VASCULAR: Normal major intracranial vascular flow voids present at skull base. SKULL AND UPPER CERVICAL SPINE: Mild partially empty sella. No suspicious calvarial bone marrow signal.  Craniocervical junction maintained. SINUSES/ORBITS: LEFT maxillary mucosal retention cysts without paranasal sinus air-fluid levels. Mild paranasal sinus mucosal thickening. Mastoid air cells are well aerated.The included ocular globes and orbital contents are non-suspicious. OTHER: None. MRA HEAD FINDINGS ANTERIOR CIRCULATION: Normal flow related enhancement of the included cervical, petrous, cavernous and supraclinoid internal carotid arteries. 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 Patent anterior communicating artery. Patent anterior and middle cerebral arteries. Moderate stenosis proximal M1 segment, similar. Severe stenosis RIGHT M3 origin, unchanged. No large vessel occlusion, flow limiting stenosis, aneurysm. POSTERIOR CIRCULATION: LEFT vertebral artery is dominant. Severe stenosis distal  RIGHT V4 segment. Basilar artery is patent, with normal flow related enhancement of the main branch vessels. Patent posterior cerebral arteries. Small LEFT posterior communicating artery present. No large vessel occlusion, flow limiting stenosis,  aneurysm. ANATOMIC VARIANTS: None. Source images and MIP images were reviewed. MRA NECK FINDINGS ANTERIOR CIRCULATION: The common carotid arteries are widely patent bilaterally. The carotid bifurcations are patent bilaterally without hemodynamically significant stenosis by NASCET criteria. No flow limiting stenosis or luminal irregularity carotid arteries. Severe stenosis LEFT subclavian artery distal to the LEFT vertebral artery takeoff. POSTERIOR CIRCULATION: Bilateral vertebral arteries are patent to the vertebrobasilar junction. No flow limiting stenosis or luminal irregularity. Source images and MIP images were reviewed. IMPRESSION: MRI HEAD: 1. No acute intracranial process. 2. Multifocal old small RIGHT MCA territory infarcts. 3. Mild chronic small vessel ischemic disease. MRA HEAD: 1. No emergent large vessel occlusion. 2.  Stable severe stenosis distal RIGHT V4 segment and RIGHT M3 origin. Moderate stenosis RIGHT M1 segment. 3. 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 MRA NECK: 1. No hemodynamically significant stenosis carotid artery's. 2. Severe stenosis LEFT subclavian artery distal to vertebral artery takeoff. Electronically Signed   By: Elon Alas M.D.   On: 12/05/2017 21:47   Mr Brain Wo Contrast (neuro Protocol)  Result Date: 12/05/2017 CLINICAL DATA:  Follow-up TIA. 4 minutes episode of the aphasia. History of stroke, hypertension, hyperlipidemia, diabetes and breast cancer. EXAM: MRI HEAD WITHOUT CONTRAST MRA HEAD WITHOUT CONTRAST MRA NECK WITHOUT AND WITH CONTRAST TECHNIQUE: Multiplanar, multiecho pulse sequences of the brain and surrounding structures were obtained without intravenous contrast. Angiographic images of the Circle of Willis were obtained using MRA technique without intravenous contrast. Angiographic images of the neck were obtained using MRA technique without and with intravenous contrast. Carotid stenosis measurements (when applicable) are obtained utilizing NASCET criteria, using the distal internal carotid diameter as the denominator. CONTRAST:  93mL MULTIHANCE GADOBENATE DIMEGLUMINE 529 MG/ML IV SOLN COMPARISON:  CT HEAD December 05, 2017 and MRA head January 20, 2017 and MRI/MRA head November 02, 2015 FINDINGS: MRI HEAD FINDINGS INTRACRANIAL CONTENTS: No reduced diffusion to suggest acute ischemia. No susceptibility artifact to suggest hemorrhage. Discontinuous RIGHT temporoparietal encephalomalacia. Mild ex vacuo dilatation RIGHT lateral ventricle, no overall parenchymal brain volume loss for age. Scattered subcentimeter supratentorial and patchy pontine white matter FLAIR T2 hyperintensities. No suspicious parenchymal signal, masses, mass effect. No abnormal extra-axial fluid collections. No extra-axial masses. VASCULAR: Normal major intracranial vascular flow voids present at skull base. SKULL AND  UPPER CERVICAL SPINE: Mild partially empty sella. No suspicious calvarial bone marrow signal. Craniocervical junction maintained. SINUSES/ORBITS: LEFT maxillary mucosal retention cysts without paranasal sinus air-fluid levels. Mild paranasal sinus mucosal thickening. Mastoid air cells are well aerated.The included ocular globes and orbital contents are non-suspicious. OTHER: None. MRA HEAD FINDINGS ANTERIOR CIRCULATION: Normal flow related enhancement of the included cervical, petrous, cavernous and supraclinoid internal carotid arteries. 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 Patent anterior communicating artery. Patent anterior and middle cerebral arteries. Moderate stenosis proximal M1 segment, similar. Severe stenosis RIGHT M3 origin, unchanged. No large vessel occlusion, flow limiting stenosis, aneurysm. POSTERIOR CIRCULATION: LEFT vertebral artery is dominant. Severe stenosis distal RIGHT V4 segment. Basilar artery is patent, with normal flow related enhancement of the main branch vessels. Patent posterior cerebral arteries. Small LEFT posterior communicating artery present. No large vessel occlusion, flow limiting stenosis,  aneurysm. ANATOMIC VARIANTS: None. Source images and MIP images were reviewed. MRA NECK FINDINGS ANTERIOR CIRCULATION: The common  carotid arteries are widely patent bilaterally. The carotid bifurcations are patent bilaterally without hemodynamically significant stenosis by NASCET criteria. No flow limiting stenosis or luminal irregularity carotid arteries. Severe stenosis LEFT subclavian artery distal to the LEFT vertebral artery takeoff. POSTERIOR CIRCULATION: Bilateral vertebral arteries are patent to the vertebrobasilar junction. No flow limiting stenosis or luminal irregularity. Source images and MIP images were reviewed. IMPRESSION: MRI HEAD: 1. No acute intracranial process. 2. Multifocal old small RIGHT MCA territory infarcts. 3. Mild  chronic small vessel ischemic disease. MRA HEAD: 1. No emergent large vessel occlusion. 2. Stable severe stenosis distal RIGHT V4 segment and RIGHT M3 origin. Moderate stenosis RIGHT M1 segment. 3. 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 MRA NECK: 1. No hemodynamically significant stenosis carotid artery's. 2. Severe stenosis LEFT subclavian artery distal to vertebral artery takeoff. Electronically Signed   By: Elon Alas M.D.   On: 12/05/2017 21:47   Mr [name] Head (cerebral Arteries)  Result Date: 12/05/2017 CLINICAL DATA:  Follow-up TIA. 4 minutes episode of the aphasia. History of stroke, hypertension, hyperlipidemia, diabetes and breast cancer. EXAM: MRI HEAD WITHOUT CONTRAST MRA HEAD WITHOUT CONTRAST MRA NECK WITHOUT AND WITH CONTRAST TECHNIQUE: Multiplanar, multiecho pulse sequences of the brain and surrounding structures were obtained without intravenous contrast. Angiographic images of the Circle of Willis were obtained using MRA technique without intravenous contrast. Angiographic images of the neck were obtained using MRA technique without and with intravenous contrast. Carotid stenosis measurements (when applicable) are obtained utilizing NASCET criteria, using the distal internal carotid diameter as the denominator. CONTRAST:  19mL MULTIHANCE GADOBENATE DIMEGLUMINE 529 MG/ML IV SOLN COMPARISON:  CT HEAD December 05, 2017 and MRA head January 20, 2017 and MRI/MRA head November 02, 2015 FINDINGS: MRI HEAD FINDINGS INTRACRANIAL CONTENTS: No reduced diffusion to suggest acute ischemia. No susceptibility artifact to suggest hemorrhage. Discontinuous RIGHT temporoparietal encephalomalacia. Mild ex vacuo dilatation RIGHT lateral ventricle, no overall parenchymal brain volume loss for age. Scattered subcentimeter supratentorial and patchy pontine white matter FLAIR T2 hyperintensities. No suspicious parenchymal signal, masses, mass effect. No abnormal extra-axial fluid collections. No extra-axial masses.  VASCULAR: Normal major intracranial vascular flow voids present at skull base. SKULL AND UPPER CERVICAL SPINE: Mild partially empty sella. No suspicious calvarial bone marrow signal. Craniocervical junction maintained. SINUSES/ORBITS: LEFT maxillary mucosal retention cysts without paranasal sinus air-fluid levels. Mild paranasal sinus mucosal thickening. Mastoid air cells are well aerated.The included ocular globes and orbital contents are non-suspicious. OTHER: None. MRA HEAD FINDINGS ANTERIOR CIRCULATION: Normal flow related enhancement of the included cervical, petrous, cavernous and supraclinoid internal carotid arteries. 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 Patent anterior communicating artery. Patent anterior and middle cerebral arteries. Moderate stenosis proximal M1 segment, similar. Severe stenosis RIGHT M3 origin, unchanged. No large vessel occlusion, flow limiting stenosis, aneurysm. POSTERIOR CIRCULATION: LEFT vertebral artery is dominant. Severe stenosis distal RIGHT V4 segment. Basilar artery is patent, with normal flow related enhancement of the main branch vessels. Patent posterior cerebral arteries. Small LEFT posterior communicating artery present. No large vessel occlusion, flow limiting stenosis,  aneurysm. ANATOMIC VARIANTS: None. Source images and MIP images were reviewed. MRA NECK FINDINGS ANTERIOR CIRCULATION: The common carotid arteries are widely patent bilaterally. The carotid bifurcations are patent bilaterally without hemodynamically significant stenosis by NASCET criteria. No flow limiting stenosis or luminal irregularity carotid arteries. Severe stenosis LEFT subclavian artery distal to the LEFT vertebral artery takeoff. POSTERIOR CIRCULATION: Bilateral vertebral arteries are patent to the vertebrobasilar junction. No flow limiting  stenosis or luminal irregularity. Source images and MIP images were reviewed. IMPRESSION: MRI HEAD: 1. No  acute intracranial process. 2. Multifocal old small RIGHT MCA territory infarcts. 3. Mild chronic small vessel ischemic disease. MRA HEAD: 1. No emergent large vessel occlusion. 2. Stable severe stenosis distal RIGHT V4 segment and RIGHT M3 origin. Moderate stenosis RIGHT M1 segment. 3. 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 MRA NECK: 1. No hemodynamically significant stenosis carotid artery's. 2. Severe stenosis LEFT subclavian artery distal to vertebral artery takeoff. Electronically Signed   By: Elon Alas M.D.   On: 12/05/2017 21:47     ASSESSMENT AND PLAN  62 y.o. female with PMH of Stroke, HTN, HLD, DM, and breast cancer, prior cocaine abuse, tobacco abuse presents with transient aphasia.   Transient ischemic attack  Admit for observation ABCD2 score of 4 Continue Dual antiplatelet - ASA + plavix Recheck Echo and interrogate loop recorder  Urine drug screen due to history of drug abuse #Transthoracic Echo  #Start or continue Atorvastatin 80 mg/other high intensity statin # BP goal: permissive HTN upto 220/120  # HBAIC and Lipid profile # Telemetry monitoring # Frequent neuro checks # NPO until passes stroke swallow screen  Please page stroke NP  Or  PA  Or MD from 8am -4 pm  as this patient from this time will be  followed by the stroke.   You can look them up on [email or web address]  Password [facility]     Sushanth Aroor [facility] Pager Number [phone]

## 2017-12-05 NOTE — ED Notes (Signed)
Patient transported to MRI 

## 2017-12-06 ENCOUNTER — Other Ambulatory Visit: Payer: Self-pay

## 2017-12-06 ENCOUNTER — Observation Stay (HOSPITAL_COMMUNITY): Payer: Self-pay

## 2017-12-06 ENCOUNTER — Observation Stay (HOSPITAL_BASED_OUTPATIENT_CLINIC_OR_DEPARTMENT_OTHER): Payer: BLUE CROSS/BLUE SHIELD

## 2017-12-06 ENCOUNTER — Encounter (HOSPITAL_COMMUNITY): Payer: Self-pay | Admitting: General Practice

## 2017-12-06 DIAGNOSIS — E876 Hypokalemia: Secondary | ICD-10-CM | POA: Diagnosis present

## 2017-12-06 DIAGNOSIS — R4701 Aphasia: Secondary | ICD-10-CM | POA: Diagnosis present

## 2017-12-06 DIAGNOSIS — G459 Transient cerebral ischemic attack, unspecified: Secondary | ICD-10-CM

## 2017-12-06 DIAGNOSIS — D72829 Elevated white blood cell count, unspecified: Secondary | ICD-10-CM | POA: Diagnosis present

## 2017-12-06 LAB — BASIC METABOLIC PANEL
Anion gap: 9 (ref 5–15)
BUN: 10 mg/dL (ref 6–20)
CO2: 26 mmol/L (ref 22–32)
CREATININE: 1.37 mg/dL — AB (ref 0.44–1.00)
Calcium: 8.7 mg/dL — ABNORMAL LOW (ref 8.9–10.3)
Chloride: 104 mmol/L (ref 101–111)
GFR, EST AFRICAN AMERICAN: 48 mL/min — AB (ref >60)
GFR, EST NON AFRICAN AMERICAN: 41 mL/min — AB (ref >60)
Glucose, Bld: 251 mg/dL — ABNORMAL HIGH (ref 65–99)
Potassium: 3.7 mmol/L (ref 3.5–5.1)
SODIUM: 139 mmol/L (ref 135–145)

## 2017-12-06 LAB — TROPONIN I

## 2017-12-06 LAB — HEMOGLOBIN A1C
Hgb A1c MFr Bld: 7.6 % — ABNORMAL HIGH (ref 4.8–5.6)
Mean Plasma Glucose: 171.42 mg/dL

## 2017-12-06 LAB — LIPID PANEL
CHOLESTEROL: 98 mg/dL (ref 0–200)
HDL: 41 mg/dL (ref >40)
LDL CALC: 20 mg/dL (ref 0–99)
Total CHOL/HDL Ratio: 2.4 RATIO
Triglycerides: 184 mg/dL — ABNORMAL HIGH (ref <150)
VLDL: 37 mg/dL (ref 0–40)

## 2017-12-06 LAB — MRSA PCR SCREENING: MRSA by PCR: NEGATIVE

## 2017-12-06 LAB — GLUCOSE, CAPILLARY
GLUCOSE-CAPILLARY: 92 mg/dL (ref 65–99)
Glucose-Capillary: 269 mg/dL — ABNORMAL HIGH (ref 65–99)
Glucose-Capillary: 313 mg/dL — ABNORMAL HIGH (ref 65–99)
Glucose-Capillary: 38 mg/dL — CL (ref 65–99)
Glucose-Capillary: 221 mg/dL — ABNORMAL HIGH (ref 65–99)

## 2017-12-06 LAB — RAPID URINE DRUG SCREEN, HOSP PERFORMED
AMPHETAMINES: NOT DETECTED
Barbiturates: NOT DETECTED
Benzodiazepines: NOT DETECTED
Cocaine: NOT DETECTED
OPIATES: NOT DETECTED
TETRAHYDROCANNABINOL: NOT DETECTED

## 2017-12-06 MED ORDER — SENNOSIDES-DOCUSATE SODIUM 8.6-50 MG PO TABS: 1.0000 | ORAL_TABLET | Freq: Every evening | ORAL | Status: DC | PRN

## 2017-12-06 MED ORDER — ACETAMINOPHEN 160 MG/5ML PO SOLN: 650.0000 mg | ORAL | Status: DC | PRN

## 2017-12-06 MED ORDER — POTASSIUM CHLORIDE CRYS ER 20 MEQ PO TBCR
20.0000 meq | EXTENDED_RELEASE_TABLET | Freq: Every day | ORAL | Status: DC
  Administered 2017-12-06 – 2017-12-07 (×2): 20 meq via ORAL
  Filled 2017-12-06 (×2): qty 1

## 2017-12-06 MED ORDER — CLOPIDOGREL BISULFATE 75 MG PO TABS
75.0000 mg | ORAL_TABLET | Freq: Every day | ORAL | Status: DC
  Administered 2017-12-06 – 2017-12-07 (×2): 75 mg via ORAL
  Filled 2017-12-06 (×2): qty 1

## 2017-12-06 MED ORDER — ASPIRIN EC 81 MG PO TBEC
81.0000 mg | DELAYED_RELEASE_TABLET | Freq: Every day | ORAL | Status: DC
  Administered 2017-12-06 – 2017-12-07 (×2): 81 mg via ORAL
  Filled 2017-12-06 (×2): qty 1

## 2017-12-06 MED ORDER — ACETAMINOPHEN 325 MG PO TABS: 650.0000 mg | ORAL_TABLET | ORAL | Status: DC | PRN

## 2017-12-06 MED ORDER — DILTIAZEM HCL ER COATED BEADS 360 MG PO CP24
360.0000 mg | ORAL_CAPSULE | Freq: Every day | ORAL | Status: DC
Start: 2017-12-06 — End: 2017-12-07
  Administered 2017-12-06 – 2017-12-07 (×2): 360 mg via ORAL
  Filled 2017-12-06 (×2): qty 2
  Filled 2017-12-06 (×2): qty 1

## 2017-12-06 MED ORDER — POTASSIUM CHLORIDE CRYS ER 20 MEQ PO TBCR
40.0000 meq | EXTENDED_RELEASE_TABLET | ORAL | Status: AC
  Administered 2017-12-06: 40 meq via ORAL
  Filled 2017-12-06: qty 2

## 2017-12-06 MED ORDER — INSULIN GLARGINE 100 UNIT/ML ~~LOC~~ SOLN
15.0000 [IU] | Freq: Every day | SUBCUTANEOUS | Status: DC
  Administered 2017-12-06 (×2): 15 [IU] via SUBCUTANEOUS
  Filled 2017-12-06 (×3): qty 0.15

## 2017-12-06 MED ORDER — INSULIN ASPART 100 UNIT/ML ~~LOC~~ SOLN
0.0000 [IU] | Freq: Three times a day (TID) | SUBCUTANEOUS | Status: DC
  Administered 2017-12-06: 7 [IU] via SUBCUTANEOUS
  Administered 2017-12-06: 5 [IU] via SUBCUTANEOUS
  Administered 2017-12-07: 1 [IU] via SUBCUTANEOUS
  Administered 2017-12-07: 3 [IU] via SUBCUTANEOUS

## 2017-12-06 MED ORDER — GLIPIZIDE 5 MG PO TABS: 5.0000 mg | ORAL_TABLET | Freq: Two times a day (BID) | ORAL | Status: DC

## 2017-12-06 MED ORDER — METFORMIN HCL 500 MG PO TABS
1000.0000 mg | ORAL_TABLET | Freq: Two times a day (BID) | ORAL | Status: DC
  Administered 2017-12-06: 1000 mg via ORAL
  Filled 2017-12-06: qty 2

## 2017-12-06 MED ORDER — TRAZODONE HCL 50 MG PO TABS: 50.0000 mg | ORAL_TABLET | Freq: Every evening | ORAL | Status: DC | PRN

## 2017-12-06 MED ORDER — ENOXAPARIN SODIUM 40 MG/0.4ML ~~LOC~~ SOLN
40.0000 mg | SUBCUTANEOUS | Status: DC
  Administered 2017-12-06 – 2017-12-07 (×2): 40 mg via SUBCUTANEOUS
  Filled 2017-12-06 (×2): qty 0.4

## 2017-12-06 MED ORDER — STROKE: EARLY STAGES OF RECOVERY BOOK
Freq: Once | Status: AC
  Administered 2017-12-06: 05:00:00
  Filled 2017-12-06: qty 1

## 2017-12-06 MED ORDER — ROSUVASTATIN CALCIUM 40 MG PO TABS
40.0000 mg | ORAL_TABLET | Freq: Every day | ORAL | Status: DC
  Administered 2017-12-06 – 2017-12-07 (×2): 40 mg via ORAL
  Filled 2017-12-06: qty 2
  Filled 2017-12-06 (×2): qty 1
  Filled 2017-12-06: qty 2

## 2017-12-06 MED ORDER — GABAPENTIN 300 MG PO CAPS
300.0000 mg | ORAL_CAPSULE | Freq: Two times a day (BID) | ORAL | Status: DC
Start: 2017-12-06 — End: 2017-12-07
  Administered 2017-12-06 – 2017-12-07 (×3): 300 mg via ORAL
  Filled 2017-12-06 (×3): qty 1

## 2017-12-06 MED ORDER — ACETAMINOPHEN 650 MG RE SUPP: 650.0000 mg | RECTAL | Status: DC | PRN

## 2017-12-06 MED ORDER — HYDRALAZINE HCL 50 MG PO TABS
50.0000 mg | ORAL_TABLET | Freq: Three times a day (TID) | ORAL | Status: DC
  Administered 2017-12-06 – 2017-12-07 (×4): 50 mg via ORAL
  Filled 2017-12-06 (×4): qty 1

## 2017-12-06 MED ORDER — HYDROCHLOROTHIAZIDE 12.5 MG PO CAPS
12.5000 mg | ORAL_CAPSULE | Freq: Every day | ORAL | Status: DC
  Administered 2017-12-06 – 2017-12-07 (×2): 12.5 mg via ORAL
  Filled 2017-12-06 (×2): qty 1

## 2017-12-06 MED ORDER — DULOXETINE HCL 60 MG PO CPEP
60.0000 mg | ORAL_CAPSULE | Freq: Every day | ORAL | Status: DC
  Administered 2017-12-06 – 2017-12-07 (×2): 60 mg via ORAL
  Filled 2017-12-06 (×2): qty 1

## 2017-12-06 MED ORDER — NICOTINE 14 MG/24HR TD PT24
14.0000 mg | MEDICATED_PATCH | Freq: Every day | TRANSDERMAL | Status: DC
Start: 2017-12-06 — End: 2017-12-07
  Administered 2017-12-06 – 2017-12-07 (×3): 14 mg via TRANSDERMAL
  Filled 2017-12-06 (×3): qty 1

## 2017-12-06 MED ORDER — ALBUTEROL SULFATE (2.5 MG/3ML) 0.083% IN NEBU
3.0000 mL | INHALATION_SOLUTION | Freq: Four times a day (QID) | RESPIRATORY_TRACT | Status: DC | PRN
Start: 2017-12-05 — End: 2017-12-07

## 2017-12-06 MED ORDER — RISAQUAD PO CAPS
1.0000 | ORAL_CAPSULE | Freq: Every day | ORAL | Status: DC
  Administered 2017-12-06 – 2017-12-07 (×2): 1 via ORAL
  Filled 2017-12-06 (×2): qty 1

## 2017-12-06 NOTE — Evaluation (Signed)
Speech Language Pathology Evaluation Patient Details Name: Lisa Anderson MRN: [id number] DOB: September 15, 1956 Today's Date: 12/06/2017 Time: 8413-2440 SLP Time Calculation (min) (ACUTE ONLY): 30 min  Problem List:  Patient Active Problem List   Diagnosis Date Noted  . Aphasia 12/06/2017  . Hypokalemia 12/06/2017  . Leukocytosis 12/06/2017  . Bipolar I disorder ([place]) 05/23/2017  . MDD (major depressive disorder), recurrent severe, without psychosis ([place]) 05/22/2017  . Major depressive disorder, recurrent episode, severe ([place]) 05/21/2017  . Cocaine use disorder, severe, dependence ([place]) 05/21/2017  . Suicidal intent 05/20/2017  . Drug overdose 05/19/2017  . Family history of colon cancer 02/23/2017  . Rectal bleeding 02/23/2017  . Gastroesophageal reflux disease without esophagitis 02/23/2017  . Intractable vomiting with nausea 02/23/2017  . Abnormal thyroid blood test 01/02/2017  . Insomnia 01/02/2017  . Anxiety 01/02/2017  . IDDM (insulin dependent diabetes mellitus) ([place])   . Asthma 11/12/2015  . Acute respiratory failure ([place]) 11/11/2015  . TIA (transient ischemic attack) 11/01/2015  . Chronic diastolic CHF (congestive heart failure) ([place]) 11/01/2015  . Acute focal neurological deficit 11/01/2015  . Type 2 diabetes mellitus with circulatory disorder ([place])   . HLD (hyperlipidemia)   . Palpitations   . History of stroke 08/05/2015  . Hypertension 08/05/2015  . Stroke due to embolism of right posterior cerebral artery ([place]) 08/05/2015  . Diabetes mellitus 08/05/2015  . Alcohol dependency ([place]) 08/10/2013  . Cocaine abuse ([place]) 08/08/2013  . Alcohol abuse 08/08/2013  . MDD (major depressive disorder) 08/08/2013   Past Medical History:  Past Medical History:  Diagnosis Date  . Asthma   . Cancer ([place])    breast  . Chronic congestive heart failure with left ventricular diastolic dysfunction ([place])   . Diabetes mellitus   . Hyperlipidemia   . Hypertension   . Stroke [facility])     Past Surgical History:  Past Surgical History:  Procedure Laterality Date  . BREAST LUMPECTOMY    . EP IMPLANTABLE DEVICE N/A 08/07/2015   Procedure: Loop Recorder Insertion;  Surgeon: Thompson Grayer, MD;  Location: [facility];  Service: Cardiovascular;  Laterality: N/A;  . TEE WITHOUT CARDIOVERSION N/A 08/07/2015   Procedure: TRANSESOPHAGEAL ECHOCARDIOGRAM (TEE);  Surgeon: Sueanne Margarita, MD;  Location: [facility] ENDOSCOPY;  Service: Cardiovascular;  Laterality: N/A;  . TONSILLECTOMY     HPI:  62 yo female adm to [facility] with expressive language deficits.  PMH + for etoh/cocaine abuse, prior right parietal-occipital cva, DM, HLD.  Pt MRI negative for acute change.  Speech eval ordered as part of stroke work up.    Assessment / Plan / Recommendation Clinical Impression  MOCA 7.3 administered with pt scoring 25/30 points - normal being 26/30.  Pt strengths included visuospatial, naming, attention, language, orientation and abstract though.  Difficulty with math task even with use of paper pencil and delayed recall noted.  Pt states "I hate math" and was noted to have difficulty with math task on evaluation in 2016.   Pt without focal CN deficits and demonstrates fluent languge/speech.  Educated pt to results and advised she use memory compensation strategies.  No SLP follow up indicated.  Pt did advise she is sad and needs to speak to someone about her feelings - advised she speak to MD regarding said concerns and offered emotional support.  Thanks for htis order.     SLP Assessment  SLP Recommendation/Assessment: Patient does not need any further Speech Lanaguage Pathology Services SLP Visit Diagnosis: Cognitive communication deficit (R41.841)  Follow Up Recommendations  None    Frequency and Duration           SLP Evaluation Cognition  Overall Cognitive Status: History of cognitive impairments - at baseline Arousal/Alertness: Awake/alert Orientation Level: Oriented X4 Attention:  Sustained;Selective Sustained Attention: Appears intact Selective Attention: Appears intact Memory: Impaired Memory Impairment: Retrieval deficit(3/5 words I, 2/5 with cues) Awareness: Appears intact Problem Solving: (math difficult for pt, which she reports is baseline) Behaviors: Other (comment)(pt tearful at end of session reporting she needs to speak to someone about her "feelings", advised her to speak to md) Safety/Judgment: Appears intact       Comprehension  Auditory Comprehension Overall Auditory Comprehension: Appears within functional limits for tasks assessed Yes/No Questions: Not tested Commands: Within Functional Limits Conversation: Complex Visual Recognition/Discrimination Discrimination: Within Function Limits Reading Comprehension Reading Status: Within funtional limits    Expression Expression Primary Mode of Expression: Verbal Verbal Expression Overall Verbal Expression: Appears within functional limits for tasks assessed Initiation: No impairment Repetition: No impairment Naming: No impairment Pragmatics: No impairment Written Expression Dominant Hand: Right Written Expression: Within Functional Limits   Oral / Motor  Oral Motor/Sensory Function Overall Oral Motor/Sensory Function: Within functional limits Motor Speech Overall Motor Speech: Appears within functional limits for tasks assessed Respiration: Within functional limits Resonance: Within functional limits Articulation: Within functional limitis Intelligibility: Intelligible Motor Planning: Witnin functional limits Motor Speech Errors: Not applicable   GO                    Lisa Anderson 12/06/2017, 10:17 AM Luanna Salk, [facility] SLP [phone]

## 2017-12-06 NOTE — Evaluation (Addendum)
Occupational Therapy Evaluation and Discharge Patient Details Name: Lisa Anderson MRN: [id number] DOB: 06/21/1956 Today's Date: 12/06/2017    History of Present Illness 62 y.o. female with medical history significant of HTN,  HLD, IDDM, CVA/TIA, polysubstance abuse and history of, suicide attempt; who presents with complaints of having about 3-4 minutes inability to say anything followed by 10-15 minutes of slurred speech and then return to normal. She has prior history of right parieto-occipital cortical infarct embolic secondary to unknown source. MRI revealed no acte infarct. MRA revealed No emergent large vessel occlusion.   Clinical Impression   PTA, pt was independent with ADL and functional mobility. She works as a Quarry manager. She does have a history of CVA with LLE weakness resulting in slight limp but this does not impact her functionally. Patient evaluated by Occupational Therapy with no further acute OT needs identified. She is able to complete all ADL with modified independence during session today. She does report concern over overwhelming depression over the past few days and offered verbal support. She reports that she plans to discuss with MD and continue to go to [facility] and encouraged this. All education has been completed and the patient has no further questions. See below for any follow-up Occupational Therapy or equipment needs. OT to sign off. Thank you for referral.      Follow Up Recommendations  No OT follow up;Supervision/Assistance - 24 hour(initial supervision)    Equipment Recommendations  None recommended by OT    Recommendations for Other Services       Precautions / Restrictions Restrictions Weight Bearing Restrictions: No      Mobility Bed Mobility Overal bed mobility: Modified Independent             General bed mobility comments: increased time  Transfers Overall transfer level: Modified independent               General transfer comment:  increased time    Balance Overall balance assessment: Mild deficits observed, not formally tested                                         ADL either performed or assessed with clinical judgement   ADL Overall ADL's : Modified independent                                       General ADL Comments: Able to complete basic ADL with increased time.      Vision Baseline Vision/History: No visual deficits Additional Comments: Pt with history of R parieto-occipital CVA. No blurry or double vision and fields in tact for functional tasks. No new stroke on MRI.      Perception     Praxis      Pertinent Vitals/Pain Pain Assessment: No/denies pain     Hand Dominance Right   Extremity/Trunk Assessment Upper Extremity Assessment Upper Extremity Assessment: Overall WFL for tasks assessed   Lower Extremity Assessment Lower Extremity Assessment: LLE deficits/detail LLE Deficits / Details: Slight weakness from previous CVA resulting in slight limp.        Communication Communication Communication: (slightly slow in responses)   Cognition Arousal/Alertness: Awake/alert Behavior During Therapy: WFL for tasks assessed/performed Overall Cognitive Status: History of cognitive impairments - at baseline  General Comments  Pt reports feeling depressed recently and having waves of panic/depressed/overwhelmed feelings. She had sought care at [facility] yesterday when symptoms began and is interested in talking with MD to see if anything they can do about this while she is here.     Exercises     Shoulder Instructions      Home Living Family/patient expects to be discharged to:: (Recovery center)                                 Additional Comments: Pt lives in recovery center with other women and they share responsibilities for home management.       Prior Functioning/Environment Level of  Independence: Independent        Comments: Works as a Quarry manager at [employer] List: Impaired balance (sitting and/or standing);Decreased activity tolerance      OT Treatment/Interventions:      OT Goals(Current goals can be found in the care plan section) Acute Rehab OT Goals Patient Stated Goal: talk to doctor about depression OT Goal Formulation: With patient  OT Frequency:     Barriers to D/C:            Co-evaluation              AM-PAC PT "6 Clicks" Daily Activity     Outcome Measure Help from another person eating meals?: None Help from another person taking care of personal grooming?: None Help from another person toileting, which includes using toliet, bedpan, or urinal?: None Help from another person bathing (including washing, rinsing, drying)?: None Help from another person to put on and taking off regular upper body clothing?: None Help from another person to put on and taking off regular lower body clothing?: None 6 Click Score: 24   End of Session    Activity Tolerance: Patient tolerated treatment well Patient left: in chair;with call bell/phone within reach  OT Visit Diagnosis: Other abnormalities of gait and mobility (R26.89)                Time: 9449-6759 OT Time Calculation (min): 12 min Charges:  OT General Charges $OT Visit: 1 Visit OT Evaluation $OT Eval Low Complexity: 1 Low G-Codes:     Norman Herrlich, MS OTR/L  Pager: [place] [facility] Odelia Graciano 12/06/2017, 10:19 AM

## 2017-12-06 NOTE — Progress Notes (Signed)
PROGRESS NOTE    Lisa Anderson  GYI:[id number] DOB: 1956-09-22 DOA: 12/05/2017 PCP: Charlott Rakes, MD   Brief Narrative by Dr Tamala Julian : Lisa Anderson is a 62 y.o. female with medical history significant of HTN,  HLD, IDDM, CVA/TIA, polysubstance abuse and history of, suicide attempt; who presents with complaints of having about 3-4 minutes inability to say anything while she was at a [facility] doctor's appointment around 3:25 pm.  Patient reports having a 10-15-minute period where her speech was slowed and slurred before returning back to baseline thereafter.  Associated symptoms include palpitations, diaphoresis, numbness intermittently in the left lower extremity, and intermittent leg swelling, and left achiness(noted as a similar symptoms during previous stroke).  Denies having any fever, chills, cough, chest pain, recent sick contacts, or medication changes.  Patient reports smoking half a pack cigarettes per day on average, but denies any recent cocaine use in the last 6 months.  ED Course: Upon admission to the emergency department patient was seen to be febrile, respirations 13-24, blood pressure 167/80-183/80, and O2 saturation maintained on room air.  Patient was brought in as a code stroke and was taken to the [facility] and evaluated by neurology.  Initial CT scan showed remote right parieto-occipital infarct with encephalomalacia and other chronic changes, no acute abnormalities.  Labs revealed WBC 12.7, potassium 3.1, and all other lab work relatively within normal limits.  Neurology recommended admission for completion of TIA workup as patient symptoms had since resolved.     Assessment & Plan:   Principal Problem:   Aphasia Active Problems:   IDDM (insulin dependent diabetes mellitus) (HCC)   Hypokalemia   Leukocytosis   Aphasia; possible.  TIA;  MRI negative for acute stroke.  MRA subclavian artery stenosis. Carotid doppler negative for subclavian syndrome. 50 % stenosis  left subclavia. Follow up with vascular out patient.   ECHO pending.  HB A1c at 7.6.  Aspirin and plavix for 3 weeks then plavix alone.  On crestor.   Left subclavian stenosis; vascular surgery out patient follow up.  No subclavia steal on doppler.   DM; hold glipizide to avoid further hypoglycemia.  On lantus.  Hold metformin.   DVT prophylaxis: Lovenox Code Status: full code.  Family Communication: care discussed with patient  Disposition Plan: home when work up is completed.   Consultants:   Neurology    Procedures:  ECHO pending.    Antimicrobials: none   Subjective: Feeling well, back to baseline.   Objective: Vitals:   12/06/17 0735 12/06/17 1157 12/06/17 1518 12/06/17 1708  BP: (!) 152/69 140/70 (!) 144/66 (!) 145/63  Pulse: 84 85 88 81  Resp: 18 18    Temp: 98.5 F (36.9 C) 98.1 F (36.7 C) 98.2 F (36.8 C) 98 F (36.7 C)  TempSrc: Oral Oral Oral Oral  SpO2: 96% 98% 97% 98%  Weight:      Height:       No intake or output data in the 24 hours ending 12/06/17 1815 Filed Weights   12/05/17 1532 12/06/17 0505  Weight: 72.6 kg (160 lb) 72.4 kg (159 lb 9.8 oz)    Examination:  General exam: Appears calm and comfortable  Respiratory system: Clear to auscultation. Respiratory effort normal. Cardiovascular system: S1 & S2 heard, RRR. No JVD, murmurs, rubs, gallops or clicks. No pedal edema. Gastrointestinal system: Abdomen is nondistended, soft and nontender. No organomegaly or masses felt. Normal bowel sounds heard. Central nervous system: Alert and oriented. No focal neurological deficits. Extremities:  Symmetric 5 x 5 power. Skin: No rashes, lesions or ulcers Psychiatry: Judgement and insight appear normal. Mood & affect appropriate.     Data Reviewed: I have personally reviewed following labs and imaging studies  CBC: Recent Labs  Lab 12/05/17 1624  WBC 12.7*  HGB 12.9  HCT 41.6  MCV 88.7  PLT 371   Basic Metabolic Panel: Recent Labs   Lab 12/05/17 1624 12/06/17 0705  NA 140 139  K 3.1* 3.7  CL 103 104  CO2 26 26  GLUCOSE 154* 251*  BUN 9 10  CREATININE 1.07* 1.37*  CALCIUM 9.0 8.7*   GFR: Estimated Creatinine Clearance: 43.6 mL/min (A) (by C-G formula based on SCr of 1.37 mg/dL (H)). Liver Function Tests: Recent Labs  Lab 12/05/17 1624  AST 21  ALT 19  ALKPHOS 96  BILITOT 0.3  PROT 6.9  ALBUMIN 3.8   No results for input(s): LIPASE, AMYLASE in the last 168 hours. No results for input(s): AMMONIA in the last 168 hours. Coagulation Profile: Recent Labs  Lab 12/05/17 1624  INR 1.00   Cardiac Enzymes: Recent Labs  Lab 12/06/17 0705  TROPONINI <0.03   BNP (last 3 results) Recent Labs    12/12/16 1314  PROBNP 31   HbA1C: Recent Labs    12/06/17 0230  HGBA1C 7.6*   CBG: Recent Labs  Lab 12/06/17 1058 12/06/17 1155 12/06/17 1644 12/06/17 1706  GLUCAP 269* 313* 38* 92   Lipid Profile: Recent Labs    12/06/17 0230  CHOL 98  HDL 41  LDLCALC 20  TRIG 184*  CHOLHDL 2.4   Thyroid Function Tests: No results for input(s): TSH, T4TOTAL, FREET4, T3FREE, THYROIDAB in the last 72 hours. Anemia Panel: No results for input(s): VITAMINB12, FOLATE, FERRITIN, TIBC, IRON, RETICCTPCT in the last 72 hours. Sepsis Labs: No results for input(s): PROCALCITON, LATICACIDVEN in the last 168 hours.  Recent Results (from the past 240 hour(s))  MRSA PCR Screening     Status: None   Collection Time: 12/06/17  3:55 AM  Result Value Ref Range Status   MRSA by PCR NEGATIVE NEGATIVE Final    Comment:        The GeneXpert MRSA Assay (FDA approved for NASAL specimens only), is one component of a comprehensive MRSA colonization surveillance program. It is not intended to diagnose MRSA infection nor to guide or monitor treatment for MRSA infections. Performed at [facility], [place] [address], [place], [place] [address]          Radiology Studies: Ct Head Wo Contrast  Result Date:  12/05/2017 CLINICAL DATA:  4 minutes of aphasia. EXAM: CT HEAD WITHOUT CONTRAST TECHNIQUE: Contiguous axial images were obtained from the base of the skull through the vertex without intravenous contrast. COMPARISON:  11/13/2016 FINDINGS: Brain: Remote infarct at the junction of the right parietal and occipital lobes encephalomalacia. Chronic minimal small vessel ischemic disease of periventricular white matter. No large vascular territory infarct, hemorrhage or midline shift. No intra-axial mass nor extra-axial fluid collections. No hydrocephalus. Partially empty pituitary sella. Vascular: Moderate atherosclerosis of the carotid siphons. Skull: Normal. Negative for fracture or focal lesion. Sinuses/Orbits: No acute finding. Other: None. IMPRESSION: 1. Remote right parietooccipital lobe infarct with encephalomalacia 2. Chronic minimal small vessel ischemic disease. 3. Chronic partially empty pituitary sella. 4. No acute intracranial abnormality Electronically Signed   By: Ashley Royalty M.D.   On: 12/05/2017 18:01   Mr Angiogram Neck W Or Wo Contrast  Result Date: 12/05/2017 CLINICAL  DATA:  Follow-up TIA. 4 minutes episode of the aphasia. History of stroke, hypertension, hyperlipidemia, diabetes and breast cancer. EXAM: MRI HEAD WITHOUT CONTRAST MRA HEAD WITHOUT CONTRAST MRA NECK WITHOUT AND WITH CONTRAST TECHNIQUE: Multiplanar, multiecho pulse sequences of the brain and surrounding structures were obtained without intravenous contrast. Angiographic images of the Circle of Willis were obtained using MRA technique without intravenous contrast. Angiographic images of the neck were obtained using MRA technique without and with intravenous contrast. Carotid stenosis measurements (when applicable) are obtained utilizing NASCET criteria, using the distal internal carotid diameter as the denominator. CONTRAST:  53mL MULTIHANCE GADOBENATE DIMEGLUMINE 529 MG/ML IV SOLN COMPARISON:  CT HEAD December 05, 2017 and MRA head January 20, 2017 and MRI/MRA head November 02, 2015 FINDINGS: MRI HEAD FINDINGS INTRACRANIAL CONTENTS: No reduced diffusion to suggest acute ischemia. No susceptibility artifact to suggest hemorrhage. Discontinuous RIGHT temporoparietal encephalomalacia. Mild ex vacuo dilatation RIGHT lateral ventricle, no overall parenchymal brain volume loss for age. Scattered subcentimeter supratentorial and patchy pontine white matter FLAIR T2 hyperintensities. No suspicious parenchymal signal, masses, mass effect. No abnormal extra-axial fluid collections. No extra-axial masses. VASCULAR: Normal major intracranial vascular flow voids present at skull base. SKULL AND UPPER CERVICAL SPINE: Mild partially empty sella. No suspicious calvarial bone marrow signal. Craniocervical junction maintained. SINUSES/ORBITS: LEFT maxillary mucosal retention cysts without paranasal sinus air-fluid levels. Mild paranasal sinus mucosal thickening. Mastoid air cells are well aerated.The included ocular globes and orbital contents are non-suspicious. OTHER: None. MRA HEAD FINDINGS ANTERIOR CIRCULATION: Normal flow related enhancement of the included cervical, petrous, cavernous and supraclinoid internal carotid arteries. 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 Patent anterior communicating artery. Patent anterior and middle cerebral arteries. Moderate stenosis proximal M1 segment, similar. Severe stenosis RIGHT M3 origin, unchanged. No large vessel occlusion, flow limiting stenosis, aneurysm. POSTERIOR CIRCULATION: LEFT vertebral artery is dominant. Severe stenosis distal RIGHT V4 segment. Basilar artery is patent, with normal flow related enhancement of the main branch vessels. Patent posterior cerebral arteries. Small LEFT posterior communicating artery present. No large vessel occlusion, flow limiting stenosis,  aneurysm. ANATOMIC VARIANTS: None. Source images and MIP images were reviewed. MRA NECK FINDINGS  ANTERIOR CIRCULATION: The common carotid arteries are widely patent bilaterally. The carotid bifurcations are patent bilaterally without hemodynamically significant stenosis by NASCET criteria. No flow limiting stenosis or luminal irregularity carotid arteries. Severe stenosis LEFT subclavian artery distal to the LEFT vertebral artery takeoff. POSTERIOR CIRCULATION: Bilateral vertebral arteries are patent to the vertebrobasilar junction. No flow limiting stenosis or luminal irregularity. Source images and MIP images were reviewed. IMPRESSION: MRI HEAD: 1. No acute intracranial process. 2. Multifocal old small RIGHT MCA territory infarcts. 3. Mild chronic small vessel ischemic disease. MRA HEAD: 1. No emergent large vessel occlusion. 2. Stable severe stenosis distal RIGHT V4 segment and RIGHT M3 origin. Moderate stenosis RIGHT M1 segment. 3. 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 MRA NECK: 1. No hemodynamically significant stenosis carotid artery's. 2. Severe stenosis LEFT subclavian artery distal to vertebral artery takeoff. Electronically Signed   By: Elon Alas M.D.   On: 12/05/2017 21:47   Mr Brain Wo Contrast (neuro Protocol)  Result Date: 12/05/2017 CLINICAL DATA:  Follow-up TIA. 4 minutes episode of the aphasia. History of stroke, hypertension, hyperlipidemia, diabetes and breast cancer. EXAM: MRI HEAD WITHOUT CONTRAST MRA HEAD WITHOUT CONTRAST MRA NECK WITHOUT AND WITH CONTRAST TECHNIQUE: Multiplanar, multiecho pulse sequences of the brain and surrounding structures were obtained without intravenous contrast. Angiographic images of  the Circle of Willis were obtained using MRA technique without intravenous contrast. Angiographic images of the neck were obtained using MRA technique without and with intravenous contrast. Carotid stenosis measurements (when applicable) are obtained utilizing NASCET criteria, using the distal internal carotid diameter as the denominator. CONTRAST:  59mL MULTIHANCE  GADOBENATE DIMEGLUMINE 529 MG/ML IV SOLN COMPARISON:  CT HEAD December 05, 2017 and MRA head January 20, 2017 and MRI/MRA head November 02, 2015 FINDINGS: MRI HEAD FINDINGS INTRACRANIAL CONTENTS: No reduced diffusion to suggest acute ischemia. No susceptibility artifact to suggest hemorrhage. Discontinuous RIGHT temporoparietal encephalomalacia. Mild ex vacuo dilatation RIGHT lateral ventricle, no overall parenchymal brain volume loss for age. Scattered subcentimeter supratentorial and patchy pontine white matter FLAIR T2 hyperintensities. No suspicious parenchymal signal, masses, mass effect. No abnormal extra-axial fluid collections. No extra-axial masses. VASCULAR: Normal major intracranial vascular flow voids present at skull base. SKULL AND UPPER CERVICAL SPINE: Mild partially empty sella. No suspicious calvarial bone marrow signal. Craniocervical junction maintained. SINUSES/ORBITS: LEFT maxillary mucosal retention cysts without paranasal sinus air-fluid levels. Mild paranasal sinus mucosal thickening. Mastoid air cells are well aerated.The included ocular globes and orbital contents are non-suspicious. OTHER: None. MRA HEAD FINDINGS ANTERIOR CIRCULATION: Normal flow related enhancement of the included cervical, petrous, cavernous and supraclinoid internal carotid arteries. 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 Patent anterior communicating artery. Patent anterior and middle cerebral arteries. Moderate stenosis proximal M1 segment, similar. Severe stenosis RIGHT M3 origin, unchanged. No large vessel occlusion, flow limiting stenosis, aneurysm. POSTERIOR CIRCULATION: LEFT vertebral artery is dominant. Severe stenosis distal RIGHT V4 segment. Basilar artery is patent, with normal flow related enhancement of the main branch vessels. Patent posterior cerebral arteries. Small LEFT posterior communicating artery present. No large vessel occlusion, flow limiting stenosis,   aneurysm. ANATOMIC VARIANTS: None. Source images and MIP images were reviewed. MRA NECK FINDINGS ANTERIOR CIRCULATION: The common carotid arteries are widely patent bilaterally. The carotid bifurcations are patent bilaterally without hemodynamically significant stenosis by NASCET criteria. No flow limiting stenosis or luminal irregularity carotid arteries. Severe stenosis LEFT subclavian artery distal to the LEFT vertebral artery takeoff. POSTERIOR CIRCULATION: Bilateral vertebral arteries are patent to the vertebrobasilar junction. No flow limiting stenosis or luminal irregularity. Source images and MIP images were reviewed. IMPRESSION: MRI HEAD: 1. No acute intracranial process. 2. Multifocal old small RIGHT MCA territory infarcts. 3. Mild chronic small vessel ischemic disease. MRA HEAD: 1. No emergent large vessel occlusion. 2. Stable severe stenosis distal RIGHT V4 segment and RIGHT M3 origin. Moderate stenosis RIGHT M1 segment. 3. 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 MRA NECK: 1. No hemodynamically significant stenosis carotid artery's. 2. Severe stenosis LEFT subclavian artery distal to vertebral artery takeoff. Electronically Signed   By: Elon Alas M.D.   On: 12/05/2017 21:47   Mr [name] Head (cerebral Arteries)  Result Date: 12/05/2017 CLINICAL DATA:  Follow-up TIA. 4 minutes episode of the aphasia. History of stroke, hypertension, hyperlipidemia, diabetes and breast cancer. EXAM: MRI HEAD WITHOUT CONTRAST MRA HEAD WITHOUT CONTRAST MRA NECK WITHOUT AND WITH CONTRAST TECHNIQUE: Multiplanar, multiecho pulse sequences of the brain and surrounding structures were obtained without intravenous contrast. Angiographic images of the Circle of Willis were obtained using MRA technique without intravenous contrast. Angiographic images of the neck were obtained using MRA technique without and with intravenous contrast. Carotid stenosis measurements (when applicable) are obtained utilizing NASCET criteria,  using the distal internal carotid diameter as the denominator. CONTRAST:  10mL MULTIHANCE GADOBENATE DIMEGLUMINE 529  MG/ML IV SOLN COMPARISON:  CT HEAD December 05, 2017 and MRA head January 20, 2017 and MRI/MRA head November 02, 2015 FINDINGS: MRI HEAD FINDINGS INTRACRANIAL CONTENTS: No reduced diffusion to suggest acute ischemia. No susceptibility artifact to suggest hemorrhage. Discontinuous RIGHT temporoparietal encephalomalacia. Mild ex vacuo dilatation RIGHT lateral ventricle, no overall parenchymal brain volume loss for age. Scattered subcentimeter supratentorial and patchy pontine white matter FLAIR T2 hyperintensities. No suspicious parenchymal signal, masses, mass effect. No abnormal extra-axial fluid collections. No extra-axial masses. VASCULAR: Normal major intracranial vascular flow voids present at skull base. SKULL AND UPPER CERVICAL SPINE: Mild partially empty sella. No suspicious calvarial bone marrow signal. Craniocervical junction maintained. SINUSES/ORBITS: LEFT maxillary mucosal retention cysts without paranasal sinus air-fluid levels. Mild paranasal sinus mucosal thickening. Mastoid air cells are well aerated.The included ocular globes and orbital contents are non-suspicious. OTHER: None. MRA HEAD FINDINGS ANTERIOR CIRCULATION: Normal flow related enhancement of the included cervical, petrous, cavernous and supraclinoid internal carotid arteries. 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 Patent anterior communicating artery. Patent anterior and middle cerebral arteries. Moderate stenosis proximal M1 segment, similar. Severe stenosis RIGHT M3 origin, unchanged. No large vessel occlusion, flow limiting stenosis, aneurysm. POSTERIOR CIRCULATION: LEFT vertebral artery is dominant. Severe stenosis distal RIGHT V4 segment. Basilar artery is patent, with normal flow related enhancement of the main branch vessels. Patent posterior cerebral arteries. Small LEFT  posterior communicating artery present. No large vessel occlusion, flow limiting stenosis,  aneurysm. ANATOMIC VARIANTS: None. Source images and MIP images were reviewed. MRA NECK FINDINGS ANTERIOR CIRCULATION: The common carotid arteries are widely patent bilaterally. The carotid bifurcations are patent bilaterally without hemodynamically significant stenosis by NASCET criteria. No flow limiting stenosis or luminal irregularity carotid arteries. Severe stenosis LEFT subclavian artery distal to the LEFT vertebral artery takeoff. POSTERIOR CIRCULATION: Bilateral vertebral arteries are patent to the vertebrobasilar junction. No flow limiting stenosis or luminal irregularity. Source images and MIP images were reviewed. IMPRESSION: MRI HEAD: 1. No acute intracranial process. 2. Multifocal old small RIGHT MCA territory infarcts. 3. Mild chronic small vessel ischemic disease. MRA HEAD: 1. No emergent large vessel occlusion. 2. Stable severe stenosis distal RIGHT V4 segment and RIGHT M3 origin. Moderate stenosis RIGHT M1 segment. 3. 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 MRA NECK: 1. No hemodynamically significant stenosis carotid artery's. 2. Severe stenosis LEFT subclavian artery distal to vertebral artery takeoff. Electronically Signed   By: Elon Alas M.D.   On: 12/05/2017 21:47        Scheduled Meds: . acidophilus  1 capsule Oral Daily  . aspirin EC  81 mg Oral Daily  . clopidogrel  75 mg Oral Daily  . diltiazem  360 mg Oral Daily  . DULoxetine  60 mg Oral Daily  . enoxaparin (LOVENOX) injection  40 mg Subcutaneous Q24H  . gabapentin  300 mg Oral BID  . glipiZIDE  5 mg Oral BID AC  . hydrALAZINE  50 mg Oral TID  . hydrochlorothiazide  12.5 mg Oral Daily  . insulin aspart  0-9 Units Subcutaneous TID WC  . insulin glargine  15 Units Subcutaneous QHS  . metFORMIN  1,000 mg Oral BID WC  . nicotine  14 mg Transdermal Daily  . potassium chloride SA  20 mEq Oral Daily  . potassium chloride  40  mEq Oral Once  . rosuvastatin  40 mg Oral Daily   Continuous Infusions:   LOS: 0 days    Time spent: 35 minutes.  Elmarie Shiley, MD [facility] Pager [phone]  If 7PM-7AM, please contact night-coverage [email or web address] Password [facility] 12/06/2017, 6:15 PM

## 2017-12-06 NOTE — Progress Notes (Addendum)
STROKE TEAM PROGRESS NOTE   SUBJECTIVE (INTERVAL HISTORY) No family is at the bedside.  Overall she feels her condition is completely resolved. She was in her mental health provider, she had 3-4 minutes of difficulty getting words out.  Currently resolved.  MRI negative for acute stroke.  Loop recorder has been negative for A. fib.   OBJECTIVE Temp:  [98.1 F (36.7 C)-98.5 F (36.9 C)] 98.2 F (36.8 C) (03/13 1518) Pulse Rate:  [69-88] 88 (03/13 1518) Cardiac Rhythm: Normal sinus rhythm (03/13 0700) Resp:  [13-24] 18 (03/13 1157) BP: (125-183)/(49-80) 144/66 (03/13 1518) SpO2:  [93 %-100 %] 97 % (03/13 1518) Weight:  [159 lb 9.8 oz (72.4 kg)] 159 lb 9.8 oz (72.4 kg) (03/13 0505)  Recent Labs  Lab 12/06/17 1058 12/06/17 1155  GLUCAP 269* 313*   Recent Labs  Lab 12/05/17 1624 12/06/17 0705  NA 140 139  K 3.1* 3.7  CL 103 104  CO2 26 26  GLUCOSE 154* 251*  BUN 9 10  CREATININE 1.07* 1.37*  CALCIUM 9.0 8.7*   Recent Labs  Lab 12/05/17 1624  AST 21  ALT 19  ALKPHOS 96  BILITOT 0.3  PROT 6.9  ALBUMIN 3.8   Recent Labs  Lab 12/05/17 1624  WBC 12.7*  HGB 12.9  HCT 41.6  MCV 88.7  PLT 297   Recent Labs  Lab 12/06/17 0705  TROPONINI <0.03   Recent Labs    12/05/17 1624  LABPROT 13.1  INR 1.00   Recent Labs    12/05/17 1654  COLORURINE STRAW*  LABSPEC 1.017  PHURINE 6.0  GLUCOSEU >=500*  HGBUR NEGATIVE  BILIRUBINUR NEGATIVE  KETONESUR NEGATIVE  PROTEINUR NEGATIVE  NITRITE NEGATIVE  LEUKOCYTESUR NEGATIVE       Component Value Date/Time   CHOL 98 12/06/2017 0230   TRIG 184 (H) 12/06/2017 0230   HDL 41 12/06/2017 0230   CHOLHDL 2.4 12/06/2017 0230   VLDL 37 12/06/2017 0230   LDLCALC 20 12/06/2017 0230   Lab Results  Component Value Date   HGBA1C 7.6 (H) 12/06/2017      Component Value Date/Time   LABOPIA NONE DETECTED 12/05/2017 2350   COCAINSCRNUR NONE DETECTED 12/05/2017 2350   LABBENZ NONE DETECTED 12/05/2017 2350   AMPHETMU NONE  DETECTED 12/05/2017 2350   THCU NONE DETECTED 12/05/2017 2350   LABBARB NONE DETECTED 12/05/2017 2350    No results for input(s): ETH in the last 168 hours.  I have personally reviewed the radiological images below and agree with the radiology interpretations.  Ct Anderson Wo Contrast  Result Date: 12/05/2017 CLINICAL DATA:  4 minutes of aphasia. EXAM: CT Anderson WITHOUT CONTRAST TECHNIQUE: Contiguous axial images were obtained from the base of the skull through the vertex without intravenous contrast. COMPARISON:  11/13/2016 FINDINGS: Brain: Remote infarct at the junction of the right parietal and occipital lobes encephalomalacia. Chronic minimal small vessel ischemic disease of periventricular white matter. No large vascular territory infarct, hemorrhage or midline shift. No intra-axial mass nor extra-axial fluid collections. No hydrocephalus. Partially empty pituitary sella. Vascular: Moderate atherosclerosis of the carotid siphons. Skull: Normal. Negative for fracture or focal lesion. Sinuses/Orbits: No acute finding. Other: None. IMPRESSION: 1. Remote right parietooccipital lobe infarct with encephalomalacia 2. Chronic minimal small vessel ischemic disease. 3. Chronic partially empty pituitary sella. 4. No acute intracranial abnormality Electronically Signed   By: Lisa Anderson M.D.   On: 12/05/2017 18:01   Mr Angiogram Neck W Or Wo Contrast  Result Date: 12/05/2017 CLINICAL DATA:  Follow-up TIA. 4 minutes episode of the aphasia. History of stroke, hypertension, hyperlipidemia, diabetes and breast cancer. EXAM: MRI Anderson WITHOUT CONTRAST MRA Anderson WITHOUT CONTRAST MRA NECK WITHOUT AND WITH CONTRAST TECHNIQUE: Multiplanar, multiecho pulse sequences of the brain and surrounding structures were obtained without intravenous contrast. Angiographic images of the Circle of Willis were obtained using MRA technique without intravenous contrast. Angiographic images of the neck were obtained using MRA technique without  and with intravenous contrast. Carotid stenosis measurements (when applicable) are obtained utilizing NASCET criteria, using the distal internal carotid diameter as the denominator. CONTRAST:  65mL MULTIHANCE GADOBENATE DIMEGLUMINE 529 MG/ML IV SOLN COMPARISON:  CT Anderson December 05, 2017 and MRA Anderson January 20, 2017 and MRI/MRA Anderson November 02, 2015 FINDINGS: MRI Anderson FINDINGS INTRACRANIAL CONTENTS: No reduced diffusion to suggest acute ischemia. No susceptibility artifact to suggest hemorrhage. Discontinuous RIGHT temporoparietal encephalomalacia. Mild ex vacuo dilatation RIGHT lateral ventricle, no overall parenchymal brain volume loss for age. Scattered subcentimeter supratentorial and patchy pontine white matter FLAIR T2 hyperintensities. No suspicious parenchymal signal, masses, mass effect. No abnormal extra-axial fluid collections. No extra-axial masses. VASCULAR: Normal major intracranial vascular flow voids present at skull base. SKULL AND UPPER CERVICAL SPINE: Mild partially empty sella. No suspicious calvarial bone marrow signal. Craniocervical junction maintained. SINUSES/ORBITS: LEFT maxillary mucosal retention cysts without paranasal sinus air-fluid levels. Mild paranasal sinus mucosal thickening. Mastoid air cells are well aerated.The included ocular globes and orbital contents are non-suspicious. OTHER: None. MRA Anderson FINDINGS ANTERIOR CIRCULATION: Normal flow related enhancement of the included cervical, petrous, cavernous and supraclinoid internal carotid arteries. 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 Patent anterior communicating artery. Patent anterior and middle cerebral arteries. Moderate stenosis proximal M1 segment, similar. Severe stenosis RIGHT M3 origin, unchanged. No large vessel occlusion, flow limiting stenosis, aneurysm. POSTERIOR CIRCULATION: LEFT vertebral artery is dominant. Severe stenosis distal RIGHT V4 segment. Basilar artery is  patent, with normal flow related enhancement of the main branch vessels. Patent posterior cerebral arteries. Small LEFT posterior communicating artery present. No large vessel occlusion, flow limiting stenosis,  aneurysm. ANATOMIC VARIANTS: None. Source images and MIP images were reviewed. MRA NECK FINDINGS ANTERIOR CIRCULATION: The common carotid arteries are widely patent bilaterally. The carotid bifurcations are patent bilaterally without hemodynamically significant stenosis by NASCET criteria. No flow limiting stenosis or luminal irregularity carotid arteries. Severe stenosis LEFT subclavian artery distal to the LEFT vertebral artery takeoff. POSTERIOR CIRCULATION: Bilateral vertebral arteries are patent to the vertebrobasilar junction. No flow limiting stenosis or luminal irregularity. Source images and MIP images were reviewed. IMPRESSION: MRI Anderson: 1. No acute intracranial process. 2. Multifocal old small RIGHT MCA territory infarcts. 3. Mild chronic small vessel ischemic disease. MRA Anderson: 1. No emergent large vessel occlusion. 2. Stable severe stenosis distal RIGHT V4 segment and RIGHT M3 origin. Moderate stenosis RIGHT M1 segment. 3. 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 MRA NECK: 1. No hemodynamically significant stenosis carotid artery's. 2. Severe stenosis LEFT subclavian artery distal to vertebral artery takeoff. Electronically Signed   By: Lisa Alas M.D.   On: 12/05/2017 21:47   Mr Brain Wo Contrast (neuro Protocol)  Result Date: 12/05/2017 CLINICAL DATA:  Follow-up TIA. 4 minutes episode of the aphasia. History of stroke, hypertension, hyperlipidemia, diabetes and breast cancer. EXAM: MRI Anderson WITHOUT CONTRAST MRA Anderson WITHOUT CONTRAST MRA NECK WITHOUT AND WITH CONTRAST TECHNIQUE: Multiplanar, multiecho pulse sequences of the brain and surrounding structures were obtained without intravenous contrast. Angiographic images of the Circle  of Willis were obtained using MRA technique without  intravenous contrast. Angiographic images of the neck were obtained using MRA technique without and with intravenous contrast. Carotid stenosis measurements (when applicable) are obtained utilizing NASCET criteria, using the distal internal carotid diameter as the denominator. CONTRAST:  102mL MULTIHANCE GADOBENATE DIMEGLUMINE 529 MG/ML IV SOLN COMPARISON:  CT Anderson December 05, 2017 and MRA Anderson January 20, 2017 and MRI/MRA Anderson November 02, 2015 FINDINGS: MRI Anderson FINDINGS INTRACRANIAL CONTENTS: No reduced diffusion to suggest acute ischemia. No susceptibility artifact to suggest hemorrhage. Discontinuous RIGHT temporoparietal encephalomalacia. Mild ex vacuo dilatation RIGHT lateral ventricle, no overall parenchymal brain volume loss for age. Scattered subcentimeter supratentorial and patchy pontine white matter FLAIR T2 hyperintensities. No suspicious parenchymal signal, masses, mass effect. No abnormal extra-axial fluid collections. No extra-axial masses. VASCULAR: Normal major intracranial vascular flow voids present at skull base. SKULL AND UPPER CERVICAL SPINE: Mild partially empty sella. No suspicious calvarial bone marrow signal. Craniocervical junction maintained. SINUSES/ORBITS: LEFT maxillary mucosal retention cysts without paranasal sinus air-fluid levels. Mild paranasal sinus mucosal thickening. Mastoid air cells are well aerated.The included ocular globes and orbital contents are non-suspicious. OTHER: None. MRA Anderson FINDINGS ANTERIOR CIRCULATION: Normal flow related enhancement of the included cervical, petrous, cavernous and supraclinoid internal carotid arteries. 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 Patent anterior communicating artery. Patent anterior and middle cerebral arteries. Moderate stenosis proximal M1 segment, similar. Severe stenosis RIGHT M3 origin, unchanged. No large vessel occlusion, flow limiting stenosis, aneurysm. POSTERIOR CIRCULATION:  LEFT vertebral artery is dominant. Severe stenosis distal RIGHT V4 segment. Basilar artery is patent, with normal flow related enhancement of the main branch vessels. Patent posterior cerebral arteries. Small LEFT posterior communicating artery present. No large vessel occlusion, flow limiting stenosis,  aneurysm. ANATOMIC VARIANTS: None. Source images and MIP images were reviewed. MRA NECK FINDINGS ANTERIOR CIRCULATION: The common carotid arteries are widely patent bilaterally. The carotid bifurcations are patent bilaterally without hemodynamically significant stenosis by NASCET criteria. No flow limiting stenosis or luminal irregularity carotid arteries. Severe stenosis LEFT subclavian artery distal to the LEFT vertebral artery takeoff. POSTERIOR CIRCULATION: Bilateral vertebral arteries are patent to the vertebrobasilar junction. No flow limiting stenosis or luminal irregularity. Source images and MIP images were reviewed. IMPRESSION: MRI Anderson: 1. No acute intracranial process. 2. Multifocal old small RIGHT MCA territory infarcts. 3. Mild chronic small vessel ischemic disease. MRA Anderson: 1. No emergent large vessel occlusion. 2. Stable severe stenosis distal RIGHT V4 segment and RIGHT M3 origin. Moderate stenosis RIGHT M1 segment. 3. 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 MRA NECK: 1. No hemodynamically significant stenosis carotid artery's. 2. Severe stenosis LEFT subclavian artery distal to vertebral artery takeoff. Electronically Signed   By: Lisa Alas M.D.   On: 12/05/2017 21:47   Mr Lisa Anderson (cerebral Arteries)  Result Date: 12/05/2017 CLINICAL DATA:  Follow-up TIA. 4 minutes episode of the aphasia. History of stroke, hypertension, hyperlipidemia, diabetes and breast cancer. EXAM: MRI Anderson WITHOUT CONTRAST MRA Anderson WITHOUT CONTRAST MRA NECK WITHOUT AND WITH CONTRAST TECHNIQUE: Multiplanar, multiecho pulse sequences of the brain and surrounding structures were obtained without intravenous  contrast. Angiographic images of the Circle of Willis were obtained using MRA technique without intravenous contrast. Angiographic images of the neck were obtained using MRA technique without and with intravenous contrast. Carotid stenosis measurements (when applicable) are obtained utilizing NASCET criteria, using the distal internal carotid diameter as the denominator. CONTRAST:  66mL MULTIHANCE GADOBENATE DIMEGLUMINE 529 MG/ML IV  SOLN COMPARISON:  CT Anderson December 05, 2017 and MRA Anderson January 20, 2017 and MRI/MRA Anderson November 02, 2015 FINDINGS: MRI Anderson FINDINGS INTRACRANIAL CONTENTS: No reduced diffusion to suggest acute ischemia. No susceptibility artifact to suggest hemorrhage. Discontinuous RIGHT temporoparietal encephalomalacia. Mild ex vacuo dilatation RIGHT lateral ventricle, no overall parenchymal brain volume loss for age. Scattered subcentimeter supratentorial and patchy pontine white matter FLAIR T2 hyperintensities. No suspicious parenchymal signal, masses, mass effect. No abnormal extra-axial fluid collections. No extra-axial masses. VASCULAR: Normal major intracranial vascular flow voids present at skull base. SKULL AND UPPER CERVICAL SPINE: Mild partially empty sella. No suspicious calvarial bone marrow signal. Craniocervical junction maintained. SINUSES/ORBITS: LEFT maxillary mucosal retention cysts without paranasal sinus air-fluid levels. Mild paranasal sinus mucosal thickening. Mastoid air cells are well aerated.The included ocular globes and orbital contents are non-suspicious. OTHER: None. MRA Anderson FINDINGS ANTERIOR CIRCULATION: Normal flow related enhancement of the included cervical, petrous, cavernous and supraclinoid internal carotid arteries. 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 Patent anterior communicating artery. Patent anterior and middle cerebral arteries. Moderate stenosis proximal M1 segment, similar. Severe stenosis RIGHT M3 origin,  unchanged. No large vessel occlusion, flow limiting stenosis, aneurysm. POSTERIOR CIRCULATION: LEFT vertebral artery is dominant. Severe stenosis distal RIGHT V4 segment. Basilar artery is patent, with normal flow related enhancement of the main branch vessels. Patent posterior cerebral arteries. Small LEFT posterior communicating artery present. No large vessel occlusion, flow limiting stenosis,  aneurysm. ANATOMIC VARIANTS: None. Source images and MIP images were reviewed. MRA NECK FINDINGS ANTERIOR CIRCULATION: The common carotid arteries are widely patent bilaterally. The carotid bifurcations are patent bilaterally without hemodynamically significant stenosis by NASCET criteria. No flow limiting stenosis or luminal irregularity carotid arteries. Severe stenosis LEFT subclavian artery distal to the LEFT vertebral artery takeoff. POSTERIOR CIRCULATION: Bilateral vertebral arteries are patent to the vertebrobasilar junction. No flow limiting stenosis or luminal irregularity. Source images and MIP images were reviewed. IMPRESSION: MRI Anderson: 1. No acute intracranial process. 2. Multifocal old small RIGHT MCA territory infarcts. 3. Mild chronic small vessel ischemic disease. MRA Anderson: 1. No emergent large vessel occlusion. 2. Stable severe stenosis distal RIGHT V4 segment and RIGHT M3 origin. Moderate stenosis RIGHT M1 segment. 3. 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 MRA NECK: 1. No hemodynamically significant stenosis carotid artery's. 2. Severe stenosis LEFT subclavian artery distal to vertebral artery takeoff. Electronically Signed   By: Lisa Alas M.D.   On: 12/05/2017 21:47    Carotid Doppler  1-39% ICA stenosis, antegrade vertebral flow. Left subclavian >50% stenosis.  TTE pending   PHYSICAL EXAM  Temp:  [98.1 F (36.7 C)-98.5 F (36.9 C)] 98.2 F (36.8 C) (03/13 1518) Pulse Rate:  [69-88] 88 (03/13 1518) Resp:  [13-24] 18 (03/13 1157) BP: (125-183)/(49-80) 144/66 (03/13 1518) SpO2:   [93 %-100 %] 97 % (03/13 1518) Weight:  [159 lb 9.8 oz (72.4 kg)] 159 lb 9.8 oz (72.4 kg) (03/13 0505)  General - Well nourished, well developed, in no apparent distress.  Ophthalmologic - fundi not visualized due to noncooperation.  Cardiovascular - Regular rate and rhythm with no murmur.  Mental Status -  Level of arousal and orientation to time, place, and person were intact. Language including expression, naming, repetition, comprehension was assessed and found intact. Fund of Knowledge was assessed and was intact.  Cranial Nerves II - XII - II - Visual field intact OU. III, IV, VI - Extraocular movements intact. V - Facial sensation intact bilaterally.  VII - Facial movement intact bilaterally. VIII - Hearing & vestibular intact bilaterally. X - Palate elevates symmetrically. XI - Chin turning & shoulder shrug intact bilaterally. XII - Tongue protrusion intact.  Motor Strength - The patient's strength was normal in all extremities and pronator drift was absent.  Bulk was normal and fasciculations were absent.   Motor Tone - Muscle tone was assessed at the neck and appendages and was normal.  Reflexes - The patient's reflexes were symmetrical in all extremities and she had no pathological reflexes.  Sensory - Light touch, temperature/pinprick were assessed and were symmetrical.    Coordination - The patient had normal movements in the hands and feet with no ataxia or dysmetria.  Tremor was absent.  Gait and Station - deferred.   ASSESSMENT/PLAN [name] is a 62 y.o. female with history of hypertension, hyperlipidemia, diabetes, CVA, TIA, smoker admitted for episode of aphasia. No tPA given due to symptoms resolved.    TIA: Possible left brain TIA  Resultant back to baseline  MRI no acute stroke  MRA intracranial stenosis including right V4, right M3, right M1, as well as left subclavian artery stenosis  Carotid Doppler showed no subclavian steal  2D Echo  pending  LDL 20  HgbA1c 7.6  Lovenox for VTE prophylaxis  Fall precautions  Aspiration precautions  Diet heart healthy/carb modified Room service appropriate? Yes; Fluid consistency: Thin   clopidogrel 75 mg daily prior to admission, now on aspirin 81 mg daily and clopidogrel 75 mg daily for 3 weeks and then Plavix alone.  Patient counseled to be compliant with her antithrombotic medications  Ongoing aggressive stroke risk factor management  Therapy recommendations: Pending  Disposition: Pending  History of stroke and TIA  07/2015 right parieto-occipital cortical infarct, MRI showed right M2 severe stenosis versus occlusion.  2D echo negative CTA neck negative LDL 138 and A1c 9.9 TEE negative patient put on aspirin and Crestor.  Had outpatient loop recorder placed  10/2015 TIA episode with left facial numbness, LDL 127 A1c 10.9, MRI negative, MRI showed diffuse atherosclerosis intracranially.  Carotid Doppler negative and loop no A. fib.  Patient put on Plavix and Crestor  Patient [facility] with [facility] Jaynee [facility] at [facility]  Left subclavian stenosis  Confirmed on CTA and carotid Doppler   no subclavian steal on carotid Doppler  Recommend vascular surgery outpatient follow-up  Diabetes  HgbA1c 7.6 goal < 7.0  Uncontrolled  Hyperglycemia  CBG monitoring  SSI  DM education and close PCP follow up  Hypertension Stable Permissive hypertension (OK if <220/120) for 24-48 hours post stroke and then gradually normalized within 5-7 days.  Long term BP goal normotensive  Hyperlipidemia  Home meds: Crestor  LDL 20, goal < 70  Now on Crestor  Continue statin at discharge  Tobacco abuse  Current smoker  Smoking cessation counseling provided  Nicotine patch provided  Pt is willing to quit  Other Stroke Risk Factors  Advanced age  Other Active Problems  Elevated creatinine 1.37  Hospital day # 0  Neurology will sign off. Please call with questions. Pt will  follow up with Dr. Jaynee Eagles at [facility] in about 4 weeks. Thanks for the consult.   Rosalin Hawking, MD PhD Stroke Neurology 12/06/2017 4:48 PM    To contact Stroke Continuity provider, please refer to [email or web address] After hours, contact General Neurology

## 2017-12-06 NOTE — Progress Notes (Signed)
PT Cancellation Note  Patient Details Name: Lisa Anderson MRN: [id number] DOB: 06-26-1956   Cancelled Treatment:    Reason Eval/Treat Not Completed: PT screened, no needs identified, will sign off Spoke with pt, she states she has walked to the bathroom numerous times and is feeling back to normal and does not need PT assessment at this time.   Latica Hohmann B. Migdalia Dk PT, DPT Acute Rehabilitation  [phone] Pager [phone]   [place] 12/06/2017, 12:54 PM

## 2017-12-06 NOTE — Evaluation (Signed)
SLP Cancellation Note  Patient Details Name: Lisa Anderson MRN: [id number] DOB: 1956-04-12   Cancelled treatment:       Reason Eval/Treat Not Completed: Other (comment)(pt with therapy at this time, will continue efforts)   Macario Golds 12/06/2017, 8:56 AM   Luanna Salk, [facility] SLP [phone]

## 2017-12-06 NOTE — ED Notes (Signed)
Attempted to call report nurse to call back.

## 2017-12-06 NOTE — Progress Notes (Signed)
Carotid duplex prelim: 1-39% ICA stenosis, antegrade vertebral flow. Left subclavian >50% stenosis. Landry Mellow, RDMS, RVT

## 2017-12-07 ENCOUNTER — Observation Stay (HOSPITAL_COMMUNITY): Payer: BLUE CROSS/BLUE SHIELD

## 2017-12-07 DIAGNOSIS — R4701 Aphasia: Secondary | ICD-10-CM | POA: Diagnosis not present

## 2017-12-07 LAB — GLUCOSE, CAPILLARY
Glucose-Capillary: 135 mg/dL — ABNORMAL HIGH (ref 65–99)
Glucose-Capillary: 215 mg/dL — ABNORMAL HIGH (ref 65–99)

## 2017-12-07 LAB — ECHOCARDIOGRAM COMPLETE
HEIGHTINCHES: 65 in
Weight: 2553.81 oz

## 2017-12-07 MED ORDER — ASPIRIN 81 MG PO TBEC: 81.0000 mg | DELAYED_RELEASE_TABLET | Freq: Every day | ORAL | 0 refills | Status: DC

## 2017-12-07 MED ORDER — HYDRALAZINE HCL 50 MG PO TABS: 50.0000 mg | ORAL_TABLET | Freq: Two times a day (BID) | ORAL | 3 refills | Status: DC

## 2017-12-07 NOTE — Progress Notes (Signed)
Inpatient Diabetes Program Recommendations  AACE/ADA: New Consensus Statement on Inpatient Glycemic Control (2015)  Target Ranges:  Prepandial:   less than 140 mg/dL      Peak postprandial:   less than 180 mg/dL (1-2 hours)      Critically ill patients:  140 - 180 mg/dL   Results for CONNI, KNIGHTON (MRN [id number]) as of 12/07/2017 10:17  Ref. Range 12/06/2017 10:58 12/06/2017 11:55 12/06/2017 16:44 12/06/2017 17:06 12/06/2017 20:55 12/07/2017 07:59  Glucose-Capillary Latest Ref Range: 65 - 99 mg/dL 269 (H) 313 (H) 38 (LL) 92 221 (H) 135 (H)   Review of Glycemic Control  Diabetes history: DM Outpatient Diabetes medications: Glipizide 5 mg BID, Novolog 0-9 units tid, Lantus 15 units qhs, Metformin 1,000 mg BID Current orders for Inpatient glycemic control: Lantus 15 units qhs, Novolog Sensitive Correction 0-9 units tid  Inpatient Diabetes Program Recommendations:    Hypoglycemia 38 mg/dl yesterday. Glucose obtained 11:55, insulin coverage given 1329 pm.  Will watch on current regimen. Glucose within inpatient goal after Lantus 15 units given last night.  Thanks,  Tama Headings RN, MSN, BC-ADM, [facility] Inpatient Diabetes Coordinator Team Pager [phone] (8a-5p)

## 2017-12-07 NOTE — Progress Notes (Signed)
  Echocardiogram 2D Echocardiogram has been performed.  Jennette Dubin 12/07/2017, 9:49 AM

## 2017-12-07 NOTE — Progress Notes (Signed)
Lisa Anderson to be D/C'd Home per MD order.  Discussed with the patient and all questions fully answered.  VSS, Skin clean, dry and intact without evidence of skin break down, no evidence of skin tears noted. IV catheter discontinued intact. Site without signs and symptoms of complications. Dressing and pressure applied.  An After Visit Summary was printed and given to the patient. Patient received prescription.  D/c education completed with patient/family including follow up instructions, medication list, d/c activities limitations if indicated, with other d/c instructions as indicated by MD - patient able to verbalize understanding, all questions fully answered. Doctor's letter was printed and given to patient.  Patient instructed to return to ED, call 911, or call MD for any changes in condition.   Patient escorted via [place], and D/C home via private auto.  Holley Raring 12/07/2017 2:33 PM

## 2017-12-07 NOTE — Progress Notes (Signed)
Discharge order is in, MD said we need to wait for the Echo results to come in before D/C her. So MD wants to hold D/C till then.

## 2017-12-07 NOTE — Discharge Summary (Signed)
Physician Discharge Summary  Lisa Anderson TML:[id number] DOB: 07-25-1956 DOA: 12/05/2017  PCP: Charlott Rakes, MD  Admit date: 12/05/2017 Discharge date: 12/07/2017  Admitted From: Home  Disposition: Home   Recommendations for Outpatient Follow-up:  1. Follow up with PCP in 1-2 weeks 2. Please obtain BMP/CBC in one week 3. Needs to follow up with vascular for evaluation of subclavian stenosis.   Home Health: none Equipment/Devices: none  Discharge Condition: Stable.  CODE STATUS ; full code.  Diet recommendation: Heart Healthy  Brief/Interim Summary: Brief Narrative by Dr Tamala Julian : Lisa Anderson a 62 y.o.femalewith medical history significant ofHTN, HLD, IDDM, CVA/TIA, polysubstance abuseand history of,suicide attempt;who presents with complaints of having about 3-4 minutes inability to say anything while she was at a [facility] doctor's appointment around 3:25 pm.Patient reports having a 10-15-minute period where her speech was slowed and slurred before returning back to baseline thereafter. Associated symptoms include palpitations, diaphoresis, numbness intermittently in the left lower extremity, and intermittent leg swelling, andleft achiness(noted as a similar symptoms during previous stroke).Denies having any fever, chills, cough, chest pain, recent sick contacts, or medication changes.Patient reports smoking half a pack cigarettes per day on average, but denies any recent cocaine use in the last 6 months.  ED Course:Upon admissionto the emergency department patient was seen to be febrile, respirations 13-24, blood pressure 167/80-183/80, andO2 saturation maintained on room air. Patient was brought in as a code stroke and was taken to the [facility] and evaluated by neurology. Initial CT scan showed remote right parieto-occipital infarct with encephalomalacia andother chronic changes, no acute abnormalities. Labs revealed WBC 12.7, potassium 3.1, and all other lab  work relatively within normal limits. Neurology recommended admission for completion of TIA workup as patient symptoms had since resolved.     Assessment & Plan:   Principal Problem:   Aphasia Active Problems:   IDDM (insulin dependent diabetes mellitus) (HCC)   Hypokalemia   Leukocytosis   Aphasia; possible.  TIA;  MRI negative for acute stroke.  MRA subclavian artery stenosis. Carotid doppler negative for subclavian syndrome. 50 % stenosis left subclavia. Follow up with vascular out patient. Left message to vascular office.  ECHO no source of embolism.  HB A1c at 7.6.  Aspirin and plavix for 3 weeks then plavix alone.  On crestor.   Left subclavian stenosis; vascular surgery out patient follow up.  No subclavia steal on doppler.   DM;  On lantus.  Resume  Metformin and glipizide at discharge .        Discharge Diagnoses:  Principal Problem:   Aphasia Active Problems:   IDDM (insulin dependent diabetes mellitus) ([place])   Hypokalemia   Leukocytosis    Discharge Instructions  Discharge Instructions    Ambulatory referral to Neurology   Complete by:  As directed    Pt will follow up with Dr. Jaynee Eagles at [facility] in about 4-6 weeks. Thanks.   Diet - low sodium heart healthy   Complete by:  As directed    Increase activity slowly   Complete by:  As directed      Allergies as of 12/07/2017      Reactions   Prednisone Hives, Other (See Comments)   Made her "crazy", suicidal   Benicar [olmesartan] Other (See Comments)   Did not work at all for patient   Celexa [citalopram] Other (See Comments)   Made patient feel crazy, fluoxetine is fine   Effexor [venlafaxine] Other (See Comments)   Made patient feel crazy   Shrimp [  shellfish Allergy] Hives, Swelling      Medication List    STOP taking these medications   metoprolol tartrate 25 MG tablet Commonly known as:  LOPRESSOR     TAKE these medications   acidophilus Caps capsule Take 1 capsule by mouth  daily.   albuterol 108 (90 Base) MCG/ACT inhaler Commonly known as:  PROVENTIL HFA;VENTOLIN HFA Inhale 2 puffs into the lungs every 6 (six) hours as needed for wheezing or shortness of breath. Shortness of breath   aspirin 81 MG EC tablet Take 1 tablet (81 mg total) by mouth daily.   clopidogrel 75 MG tablet Commonly known as:  PLAVIX Take 1 tablet (75 mg total) by mouth daily.   CONTOUR NEXT MONITOR w/Device Kit 1 strip 3 (three) times daily by Does not apply route. E11.9   diltiazem 360 MG 24 hr capsule Commonly known as:  CARDIZEM CD Take 1 capsule (360 mg total) by mouth daily.   DULoxetine 60 MG capsule Commonly known as:  CYMBALTA Take 1 capsule (60 mg total) by mouth daily. For mood control   gabapentin 300 MG capsule Commonly known as:  NEURONTIN Take 1 capsule (300 mg total) by mouth 2 (two) times daily.   glipiZIDE 5 MG tablet Commonly known as:  GLUCOTROL Take 1 tablet (5 mg total) by mouth 2 (two) times daily before a meal.   glucose blood test strip Commonly known as:  CONTOUR NEXT TEST Use as instructed 3 times daily. E11.9   hydrALAZINE 50 MG tablet Commonly known as:  APRESOLINE Take 1 tablet (50 mg total) by mouth 2 (two) times daily. For High blood pressure What changed:  when to take this   hydrochlorothiazide 12.5 MG capsule Commonly known as:  MICROZIDE Take 1 capsule (12.5 mg total) by mouth daily.   insulin aspart 100 UNIT/ML injection Commonly known as:  novoLOG Sliding scale CBG 70 - 120: 0 units CBG 121 - 150: 1 unit,  CBG 151 - 200: 2 units,  CBG 201 - 250: 3 units,  CBG 251 - 300: 5 units,  CBG 301 - 350: 7 units,  CBG 351 - 400: 9 units   CBG > 400: 9 units and notify your MD   insulin glargine 100 UNIT/ML injection Commonly known as:  LANTUS Inject 0.2 mLs (20 Units total) into the skin at bedtime. What changed:  how much to take   metFORMIN 1000 MG tablet Commonly known as:  GLUCOPHAGE Take 1 tablet (1,000 mg total) 2 (two) times  daily with a meal by mouth.   OVER THE COUNTER MEDICATION Take 1-2 tablets by mouth daily. Vitafusion   potassium chloride SA 20 MEQ tablet Commonly known as:  K-DUR,KLOR-CON Take 1 tablet (20 mEq total) by mouth daily.   rosuvastatin 40 MG tablet Commonly known as:  CRESTOR Take 1 tablet (40 mg total) by mouth daily.   traZODone 50 MG tablet Commonly known as:  DESYREL Take 1 tablet (50 mg total) by mouth at bedtime as needed for sleep.   VITAMIN D PO Take 1-2 tablets by mouth daily.      Follow-up Information    Melvenia Beam, MD. Schedule an appointment as soon as possible for a visit in 6 week(s).   Specialty:  Neurology Contact information: [address] [place] [address] [phone]          Allergies  Allergen Reactions  . Prednisone Hives and Other (See Comments)    Made her "crazy", suicidal  .  Benicar [Olmesartan] Other (See Comments)    Did not work at all for patient  . Celexa [Citalopram] Other (See Comments)    Made patient feel crazy, fluoxetine is fine  . Effexor [Venlafaxine] Other (See Comments)    Made patient feel crazy  . Shrimp [Shellfish Allergy] Hives and Swelling    Consultations:  Neurology    Procedures/Studies: Ct Head Wo Contrast  Result Date: 12/05/2017 CLINICAL DATA:  4 minutes of aphasia. EXAM: CT HEAD WITHOUT CONTRAST TECHNIQUE: Contiguous axial images were obtained from the base of the skull through the vertex without intravenous contrast. COMPARISON:  11/13/2016 FINDINGS: Brain: Remote infarct at the junction of the right parietal and occipital lobes encephalomalacia. Chronic minimal small vessel ischemic disease of periventricular white matter. No large vascular territory infarct, hemorrhage or midline shift. No intra-axial mass nor extra-axial fluid collections. No hydrocephalus. Partially empty pituitary sella. Vascular: Moderate atherosclerosis of the carotid siphons. Skull: Normal. Negative for fracture or  focal lesion. Sinuses/Orbits: No acute finding. Other: None. IMPRESSION: 1. Remote right parietooccipital lobe infarct with encephalomalacia 2. Chronic minimal small vessel ischemic disease. 3. Chronic partially empty pituitary sella. 4. No acute intracranial abnormality Electronically Signed   By: Ashley Royalty M.D.   On: 12/05/2017 18:01   Mr Angiogram Neck W Or Wo Contrast  Result Date: 12/05/2017 CLINICAL DATA:  Follow-up TIA. 4 minutes episode of the aphasia. History of stroke, hypertension, hyperlipidemia, diabetes and breast cancer. EXAM: MRI HEAD WITHOUT CONTRAST MRA HEAD WITHOUT CONTRAST MRA NECK WITHOUT AND WITH CONTRAST TECHNIQUE: Multiplanar, multiecho pulse sequences of the brain and surrounding structures were obtained without intravenous contrast. Angiographic images of the Circle of Willis were obtained using MRA technique without intravenous contrast. Angiographic images of the neck were obtained using MRA technique without and with intravenous contrast. Carotid stenosis measurements (when applicable) are obtained utilizing NASCET criteria, using the distal internal carotid diameter as the denominator. CONTRAST:  66m MULTIHANCE GADOBENATE DIMEGLUMINE 529 MG/ML IV SOLN COMPARISON:  CT HEAD December 05, 2017 and MRA head January 20, 2017 and MRI/MRA head November 02, 2015 FINDINGS: MRI HEAD FINDINGS INTRACRANIAL CONTENTS: No reduced diffusion to suggest acute ischemia. No susceptibility artifact to suggest hemorrhage. Discontinuous RIGHT temporoparietal encephalomalacia. Mild ex vacuo dilatation RIGHT lateral ventricle, no overall parenchymal brain volume loss for age. Scattered subcentimeter supratentorial and patchy pontine white matter FLAIR T2 hyperintensities. No suspicious parenchymal signal, masses, mass effect. No abnormal extra-axial fluid collections. No extra-axial masses. VASCULAR: Normal major intracranial vascular flow voids present at skull base. SKULL AND UPPER CERVICAL SPINE: Mild  partially empty sella. No suspicious calvarial bone marrow signal. Craniocervical junction maintained. SINUSES/ORBITS: LEFT maxillary mucosal retention cysts without paranasal sinus air-fluid levels. Mild paranasal sinus mucosal thickening. Mastoid air cells are well aerated.The included ocular globes and orbital contents are non-suspicious. OTHER: None. MRA HEAD FINDINGS ANTERIOR CIRCULATION: Normal flow related enhancement of the included cervical, petrous, cavernous and supraclinoid internal carotid arteries. 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 Patent anterior communicating artery. Patent anterior and middle cerebral arteries. Moderate stenosis proximal M1 segment, similar. Severe stenosis RIGHT M3 origin, unchanged. No large vessel occlusion, flow limiting stenosis, aneurysm. POSTERIOR CIRCULATION: LEFT vertebral artery is dominant. Severe stenosis distal RIGHT V4 segment. Basilar artery is patent, with normal flow related enhancement of the main branch vessels. Patent posterior cerebral arteries. Small LEFT posterior communicating artery present. No large vessel occlusion, flow limiting stenosis,  aneurysm. ANATOMIC VARIANTS: None. Source images and MIP images were reviewed.  MRA NECK FINDINGS ANTERIOR CIRCULATION: The common carotid arteries are widely patent bilaterally. The carotid bifurcations are patent bilaterally without hemodynamically significant stenosis by NASCET criteria. No flow limiting stenosis or luminal irregularity carotid arteries. Severe stenosis LEFT subclavian artery distal to the LEFT vertebral artery takeoff. POSTERIOR CIRCULATION: Bilateral vertebral arteries are patent to the vertebrobasilar junction. No flow limiting stenosis or luminal irregularity. Source images and MIP images were reviewed. IMPRESSION: MRI HEAD: 1. No acute intracranial process. 2. Multifocal old small RIGHT MCA territory infarcts. 3. Mild chronic small vessel  ischemic disease. MRA HEAD: 1. No emergent large vessel occlusion. 2. Stable severe stenosis distal RIGHT V4 segment and RIGHT M3 origin. Moderate stenosis RIGHT M1 segment. 3. 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 MRA NECK: 1. No hemodynamically significant stenosis carotid artery's. 2. Severe stenosis LEFT subclavian artery distal to vertebral artery takeoff. Electronically Signed   By: Elon Alas M.D.   On: 12/05/2017 21:47   Mr Brain Wo Contrast (neuro Protocol)  Result Date: 12/05/2017 CLINICAL DATA:  Follow-up TIA. 4 minutes episode of the aphasia. History of stroke, hypertension, hyperlipidemia, diabetes and breast cancer. EXAM: MRI HEAD WITHOUT CONTRAST MRA HEAD WITHOUT CONTRAST MRA NECK WITHOUT AND WITH CONTRAST TECHNIQUE: Multiplanar, multiecho pulse sequences of the brain and surrounding structures were obtained without intravenous contrast. Angiographic images of the Circle of Willis were obtained using MRA technique without intravenous contrast. Angiographic images of the neck were obtained using MRA technique without and with intravenous contrast. Carotid stenosis measurements (when applicable) are obtained utilizing NASCET criteria, using the distal internal carotid diameter as the denominator. CONTRAST:  48m MULTIHANCE GADOBENATE DIMEGLUMINE 529 MG/ML IV SOLN COMPARISON:  CT HEAD December 05, 2017 and MRA head January 20, 2017 and MRI/MRA head November 02, 2015 FINDINGS: MRI HEAD FINDINGS INTRACRANIAL CONTENTS: No reduced diffusion to suggest acute ischemia. No susceptibility artifact to suggest hemorrhage. Discontinuous RIGHT temporoparietal encephalomalacia. Mild ex vacuo dilatation RIGHT lateral ventricle, no overall parenchymal brain volume loss for age. Scattered subcentimeter supratentorial and patchy pontine white matter FLAIR T2 hyperintensities. No suspicious parenchymal signal, masses, mass effect. No abnormal extra-axial fluid collections. No extra-axial masses. VASCULAR: Normal  major intracranial vascular flow voids present at skull base. SKULL AND UPPER CERVICAL SPINE: Mild partially empty sella. No suspicious calvarial bone marrow signal. Craniocervical junction maintained. SINUSES/ORBITS: LEFT maxillary mucosal retention cysts without paranasal sinus air-fluid levels. Mild paranasal sinus mucosal thickening. Mastoid air cells are well aerated.The included ocular globes and orbital contents are non-suspicious. OTHER: None. MRA HEAD FINDINGS ANTERIOR CIRCULATION: Normal flow related enhancement of the included cervical, petrous, cavernous and supraclinoid internal carotid arteries. 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 Patent anterior communicating artery. Patent anterior and middle cerebral arteries. Moderate stenosis proximal M1 segment, similar. Severe stenosis RIGHT M3 origin, unchanged. No large vessel occlusion, flow limiting stenosis, aneurysm. POSTERIOR CIRCULATION: LEFT vertebral artery is dominant. Severe stenosis distal RIGHT V4 segment. Basilar artery is patent, with normal flow related enhancement of the main branch vessels. Patent posterior cerebral arteries. Small LEFT posterior communicating artery present. No large vessel occlusion, flow limiting stenosis,  aneurysm. ANATOMIC VARIANTS: None. Source images and MIP images were reviewed. MRA NECK FINDINGS ANTERIOR CIRCULATION: The common carotid arteries are widely patent bilaterally. The carotid bifurcations are patent bilaterally without hemodynamically significant stenosis by NASCET criteria. No flow limiting stenosis or luminal irregularity carotid arteries. Severe stenosis LEFT subclavian artery distal to the LEFT vertebral artery takeoff. POSTERIOR CIRCULATION: Bilateral vertebral arteries are  patent to the vertebrobasilar junction. No flow limiting stenosis or luminal irregularity. Source images and MIP images were reviewed. IMPRESSION: MRI HEAD: 1. No acute intracranial  process. 2. Multifocal old small RIGHT MCA territory infarcts. 3. Mild chronic small vessel ischemic disease. MRA HEAD: 1. No emergent large vessel occlusion. 2. Stable severe stenosis distal RIGHT V4 segment and RIGHT M3 origin. Moderate stenosis RIGHT M1 segment. 3. 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 MRA NECK: 1. No hemodynamically significant stenosis carotid artery's. 2. Severe stenosis LEFT subclavian artery distal to vertebral artery takeoff. Electronically Signed   By: Elon Alas M.D.   On: 12/05/2017 21:47   Mr [name] Head (cerebral Arteries)  Result Date: 12/05/2017 CLINICAL DATA:  Follow-up TIA. 4 minutes episode of the aphasia. History of stroke, hypertension, hyperlipidemia, diabetes and breast cancer. EXAM: MRI HEAD WITHOUT CONTRAST MRA HEAD WITHOUT CONTRAST MRA NECK WITHOUT AND WITH CONTRAST TECHNIQUE: Multiplanar, multiecho pulse sequences of the brain and surrounding structures were obtained without intravenous contrast. Angiographic images of the Circle of Willis were obtained using MRA technique without intravenous contrast. Angiographic images of the neck were obtained using MRA technique without and with intravenous contrast. Carotid stenosis measurements (when applicable) are obtained utilizing NASCET criteria, using the distal internal carotid diameter as the denominator. CONTRAST:  75m MULTIHANCE GADOBENATE DIMEGLUMINE 529 MG/ML IV SOLN COMPARISON:  CT HEAD December 05, 2017 and MRA head January 20, 2017 and MRI/MRA head November 02, 2015 FINDINGS: MRI HEAD FINDINGS INTRACRANIAL CONTENTS: No reduced diffusion to suggest acute ischemia. No susceptibility artifact to suggest hemorrhage. Discontinuous RIGHT temporoparietal encephalomalacia. Mild ex vacuo dilatation RIGHT lateral ventricle, no overall parenchymal brain volume loss for age. Scattered subcentimeter supratentorial and patchy pontine white matter FLAIR T2 hyperintensities. No suspicious parenchymal signal, masses, mass effect.  No abnormal extra-axial fluid collections. No extra-axial masses. VASCULAR: Normal major intracranial vascular flow voids present at skull base. SKULL AND UPPER CERVICAL SPINE: Mild partially empty sella. No suspicious calvarial bone marrow signal. Craniocervical junction maintained. SINUSES/ORBITS: LEFT maxillary mucosal retention cysts without paranasal sinus air-fluid levels. Mild paranasal sinus mucosal thickening. Mastoid air cells are well aerated.The included ocular globes and orbital contents are non-suspicious. OTHER: None. MRA HEAD FINDINGS ANTERIOR CIRCULATION: Normal flow related enhancement of the included cervical, petrous, cavernous and supraclinoid internal carotid arteries. 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 Patent anterior communicating artery. Patent anterior and middle cerebral arteries. Moderate stenosis proximal M1 segment, similar. Severe stenosis RIGHT M3 origin, unchanged. No large vessel occlusion, flow limiting stenosis, aneurysm. POSTERIOR CIRCULATION: LEFT vertebral artery is dominant. Severe stenosis distal RIGHT V4 segment. Basilar artery is patent, with normal flow related enhancement of the main branch vessels. Patent posterior cerebral arteries. Small LEFT posterior communicating artery present. No large vessel occlusion, flow limiting stenosis,  aneurysm. ANATOMIC VARIANTS: None. Source images and MIP images were reviewed. MRA NECK FINDINGS ANTERIOR CIRCULATION: The common carotid arteries are widely patent bilaterally. The carotid bifurcations are patent bilaterally without hemodynamically significant stenosis by NASCET criteria. No flow limiting stenosis or luminal irregularity carotid arteries. Severe stenosis LEFT subclavian artery distal to the LEFT vertebral artery takeoff. POSTERIOR CIRCULATION: Bilateral vertebral arteries are patent to the vertebrobasilar junction. No flow limiting stenosis or luminal irregularity. Source  images and MIP images were reviewed. IMPRESSION: MRI HEAD: 1. No acute intracranial process. 2. Multifocal old small RIGHT MCA territory infarcts. 3. Mild chronic small vessel ischemic disease. MRA HEAD: 1. No emergent large vessel occlusion. 2. Stable severe  stenosis distal RIGHT V4 segment and RIGHT M3 origin. Moderate stenosis RIGHT M1 segment. 3. 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 MRA NECK: 1. No hemodynamically significant stenosis carotid artery's. 2. Severe stenosis LEFT subclavian artery distal to vertebral artery takeoff. Electronically Signed   By: Elon Alas M.D.   On: 12/05/2017 21:47       Subjective: She is back to baseline., feeling well, denies suicidal thought.   Discharge Exam: Vitals:   12/07/17 0403 12/07/17 0859  BP: (!) 131/57 (!) 152/73  Pulse: 73 76  Resp: 18 18  Temp: 98 F (36.7 C) 98.4 F (36.9 C)  SpO2: 97% 99%   Vitals:   12/06/17 2056 12/07/17 0050 12/07/17 0403 12/07/17 0859  BP: (!) 145/58 (!) 142/60 (!) 131/57 (!) 152/73  Pulse: 86 83 73 76  Resp: '18 18 18 18  ' Temp: 98.4 F (36.9 C) 98.3 F (36.8 C) 98 F (36.7 C) 98.4 F (36.9 C)  TempSrc: Oral Oral Oral Oral  SpO2: 95% 96% 97% 99%  Weight:      Height:        General: Pt is alert, awake, not in acute distress Cardiovascular: RRR, S1/S2 +, no rubs, no gallops Respiratory: CTA bilaterally, no wheezing, no rhonchi Abdominal: Soft, NT, ND, bowel sounds + Extremities: no edema, no cyanosis    The results of significant diagnostics from this hospitalization (including imaging, microbiology, ancillary and laboratory) are listed below for reference.     Microbiology: Recent Results (from the past 240 hour(s))  MRSA PCR Screening     Status: None   Collection Time: 12/06/17  3:55 AM  Result Value Ref Range Status   MRSA by PCR NEGATIVE NEGATIVE Final    Comment:        The GeneXpert MRSA Assay (FDA approved for NASAL specimens only), is one component of a comprehensive  MRSA colonization surveillance program. It is not intended to diagnose MRSA infection nor to guide or monitor treatment for MRSA infections. Performed at [facility], [place] [address], [place], [place] [address]      Labs: BNP (last 3 results) No results for input(s): BNP in the last 8760 hours. Basic Metabolic Panel: Recent Labs  Lab 12/05/17 1624 12/06/17 0705  NA 140 139  K 3.1* 3.7  CL 103 104  CO2 26 26  GLUCOSE 154* 251*  BUN 9 10  CREATININE 1.07* 1.37*  CALCIUM 9.0 8.7*   Liver Function Tests: Recent Labs  Lab 12/05/17 1624  AST 21  ALT 19  ALKPHOS 96  BILITOT 0.3  PROT 6.9  ALBUMIN 3.8   No results for input(s): LIPASE, AMYLASE in the last 168 hours. No results for input(s): AMMONIA in the last 168 hours. CBC: Recent Labs  Lab 12/05/17 1624  WBC 12.7*  HGB 12.9  HCT 41.6  MCV 88.7  PLT 297   Cardiac Enzymes: Recent Labs  Lab 12/06/17 0705  TROPONINI <0.03   BNP: Invalid input(s): POCBNP CBG: Recent Labs  Lab 12/06/17 1155 12/06/17 1644 12/06/17 1706 12/06/17 2055 12/07/17 0759  GLUCAP 313* 38* 92 221* 135*   D-Dimer No results for input(s): DDIMER in the last 72 hours. Hgb A1c Recent Labs    12/06/17 0230  HGBA1C 7.6*   Lipid Profile Recent Labs    12/06/17 0230  CHOL 98  HDL 41  LDLCALC 20  TRIG 184*  CHOLHDL 2.4   Thyroid function studies No results for input(s): TSH, T4TOTAL, T3FREE, THYROIDAB in the  last 72 hours.  Invalid input(s): FREET3 Anemia work up No results for input(s): VITAMINB12, FOLATE, FERRITIN, TIBC, IRON, RETICCTPCT in the last 72 hours. Urinalysis    Component Value Date/Time   COLORURINE STRAW (A) 12/05/2017 1654   APPEARANCEUR CLEAR 12/05/2017 1654   LABSPEC 1.017 12/05/2017 1654   PHURINE 6.0 12/05/2017 1654   GLUCOSEU >=500 (A) 12/05/2017 1654   HGBUR NEGATIVE 12/05/2017 1654   BILIRUBINUR NEGATIVE 12/05/2017 1654   KETONESUR NEGATIVE 12/05/2017 1654   PROTEINUR NEGATIVE  12/05/2017 1654   UROBILINOGEN 0.2 10/10/2017 1418   NITRITE NEGATIVE 12/05/2017 1654   LEUKOCYTESUR NEGATIVE 12/05/2017 1654   Sepsis Labs Invalid input(s): PROCALCITONIN,  WBC,  LACTICIDVEN Microbiology Recent Results (from the past 240 hour(s))  MRSA PCR Screening     Status: None   Collection Time: 12/06/17  3:55 AM  Result Value Ref Range Status   MRSA by PCR NEGATIVE NEGATIVE Final    Comment:        The GeneXpert MRSA Assay (FDA approved for NASAL specimens only), is one component of a comprehensive MRSA colonization surveillance program. It is not intended to diagnose MRSA infection nor to guide or monitor treatment for MRSA infections. Performed at [facility], [place] [address], [place], [place] [address]      Time coordinating discharge: Over 30 minutes  SIGNED:   Elmarie Shiley, MD  [facility] 12/07/2017, 10:49 AM Pager   If 7PM-7AM, please contact night-coverage [email or web address] Password [email or web address]

## 2017-12-07 NOTE — Plan of Care (Signed)
  Progressing Education: Knowledge of secondary prevention will improve 12/07/2017 0146 - Progressing by Verne Grain, RN Knowledge of patient specific risk factors addressed and post discharge goals established will improve 12/07/2017 0146 - Progressing by Verne Grain, RN Education: Knowledge of secondary prevention will improve 12/07/2017 0146 - Progressing by Verne Grain, RN Knowledge of patient specific risk factors addressed and post discharge goals established will improve 12/07/2017 0146 - Progressing by Verne Grain, RN

## 2017-12-12 ENCOUNTER — Other Ambulatory Visit: Payer: Self-pay | Admitting: Family Medicine

## 2017-12-12 DIAGNOSIS — I5032 Chronic diastolic (congestive) heart failure: Secondary | ICD-10-CM

## 2017-12-20 ENCOUNTER — Ambulatory Visit: Payer: BLUE CROSS/BLUE SHIELD | Attending: Family Medicine | Admitting: Family Medicine

## 2017-12-20 ENCOUNTER — Encounter: Payer: Self-pay | Admitting: Family Medicine

## 2017-12-20 VITALS — BP 149/63 | HR 92 | Temp 98.0°F | Ht 65.0 in | Wt 161.2 lb

## 2017-12-20 DIAGNOSIS — Z79899 Other long term (current) drug therapy: Secondary | ICD-10-CM | POA: Insufficient documentation

## 2017-12-20 DIAGNOSIS — E7800 Pure hypercholesterolemia, unspecified: Secondary | ICD-10-CM

## 2017-12-20 DIAGNOSIS — I110 Hypertensive heart disease with heart failure: Secondary | ICD-10-CM | POA: Insufficient documentation

## 2017-12-20 DIAGNOSIS — I5032 Chronic diastolic (congestive) heart failure: Secondary | ICD-10-CM | POA: Diagnosis not present

## 2017-12-20 DIAGNOSIS — I10 Essential (primary) hypertension: Secondary | ICD-10-CM | POA: Diagnosis not present

## 2017-12-20 DIAGNOSIS — Z888 Allergy status to other drugs, medicaments and biological substances status: Secondary | ICD-10-CM | POA: Diagnosis not present

## 2017-12-20 DIAGNOSIS — E1142 Type 2 diabetes mellitus with diabetic polyneuropathy: Secondary | ICD-10-CM | POA: Insufficient documentation

## 2017-12-20 DIAGNOSIS — Z7982 Long term (current) use of aspirin: Secondary | ICD-10-CM | POA: Diagnosis not present

## 2017-12-20 DIAGNOSIS — E876 Hypokalemia: Secondary | ICD-10-CM | POA: Diagnosis not present

## 2017-12-20 DIAGNOSIS — K219 Gastro-esophageal reflux disease without esophagitis: Secondary | ICD-10-CM | POA: Insufficient documentation

## 2017-12-20 DIAGNOSIS — J4520 Mild intermittent asthma, uncomplicated: Secondary | ICD-10-CM | POA: Insufficient documentation

## 2017-12-20 DIAGNOSIS — Z7902 Long term (current) use of antithrombotics/antiplatelets: Secondary | ICD-10-CM | POA: Diagnosis not present

## 2017-12-20 DIAGNOSIS — Z794 Long term (current) use of insulin: Secondary | ICD-10-CM | POA: Insufficient documentation

## 2017-12-20 DIAGNOSIS — Z8673 Personal history of transient ischemic attack (TIA), and cerebral infarction without residual deficits: Secondary | ICD-10-CM | POA: Insufficient documentation

## 2017-12-20 DIAGNOSIS — F339 Major depressive disorder, recurrent, unspecified: Secondary | ICD-10-CM | POA: Insufficient documentation

## 2017-12-20 DIAGNOSIS — E1151 Type 2 diabetes mellitus with 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 gangrene: Secondary | ICD-10-CM

## 2017-12-20 LAB — GLUCOSE, POCT (MANUAL RESULT ENTRY): POC GLUCOSE: 257 mg/dL — AB (ref 70–99)

## 2017-12-20 MED ORDER — CLOPIDOGREL BISULFATE 75 MG PO TABS: 75.0000 mg | ORAL_TABLET | Freq: Every day | ORAL | 3 refills | Status: DC

## 2017-12-20 MED ORDER — GABAPENTIN 300 MG PO CAPS: 600.0000 mg | ORAL_CAPSULE | Freq: Two times a day (BID) | ORAL | 3 refills | Status: DC

## 2017-12-20 MED ORDER — GLIPIZIDE 5 MG PO TABS: 5.0000 mg | ORAL_TABLET | Freq: Two times a day (BID) | ORAL | 3 refills | Status: DC

## 2017-12-20 MED ORDER — POTASSIUM CHLORIDE CRYS ER 20 MEQ PO TBCR: 20.0000 meq | EXTENDED_RELEASE_TABLET | Freq: Every day | ORAL | 3 refills | Status: DC

## 2017-12-20 MED ORDER — RANITIDINE HCL 150 MG PO TABS: 150.0000 mg | ORAL_TABLET | Freq: Two times a day (BID) | ORAL | 3 refills | Status: DC

## 2017-12-20 MED ORDER — HYDRALAZINE HCL 50 MG PO TABS: 50.0000 mg | ORAL_TABLET | Freq: Two times a day (BID) | ORAL | 3 refills | Status: DC

## 2017-12-20 MED ORDER — HYDROCHLOROTHIAZIDE 12.5 MG PO CAPS: 12.5000 mg | ORAL_CAPSULE | Freq: Every day | ORAL | 3 refills | Status: DC

## 2017-12-20 MED ORDER — TRAZODONE HCL 50 MG PO TABS: 50.0000 mg | ORAL_TABLET | Freq: Every evening | ORAL | 3 refills | Status: DC | PRN

## 2017-12-20 MED ORDER — DULOXETINE HCL 60 MG PO CPEP: 60.0000 mg | ORAL_CAPSULE | Freq: Every day | ORAL | 3 refills | Status: DC

## 2017-12-20 MED ORDER — METFORMIN HCL 1000 MG PO TABS: 1000.0000 mg | ORAL_TABLET | Freq: Two times a day (BID) | ORAL | 3 refills | Status: DC

## 2017-12-20 MED ORDER — ROSUVASTATIN CALCIUM 40 MG PO TABS: 40.0000 mg | ORAL_TABLET | Freq: Every day | ORAL | 3 refills | Status: DC

## 2017-12-20 MED ORDER — NICOTINE 7 MG/24HR TD PT24: 7.0000 mg | MEDICATED_PATCH | Freq: Every day | TRANSDERMAL | 0 refills | Status: DC

## 2017-12-20 MED ORDER — INSULIN ASPART 100 UNIT/ML ~~LOC~~ SOLN: SUBCUTANEOUS | 12 refills | Status: DC

## 2017-12-20 MED ORDER — INSULIN GLARGINE 100 UNIT/ML ~~LOC~~ SOLN: 20.0000 [IU] | Freq: Every day | SUBCUTANEOUS | 3 refills | Status: DC

## 2017-12-20 MED ORDER — DILTIAZEM HCL ER COATED BEADS 360 MG PO CP24: 360.0000 mg | ORAL_CAPSULE | Freq: Every day | ORAL | 3 refills | Status: DC

## 2017-12-20 MED ORDER — ALBUTEROL SULFATE HFA 108 (90 BASE) MCG/ACT IN AERS: 2.0000 | INHALATION_SPRAY | Freq: Four times a day (QID) | RESPIRATORY_TRACT | 3 refills | Status: DC | PRN

## 2017-12-20 NOTE — Patient Instructions (Signed)
Food Choices for Gastroesophageal Reflux Disease, Adult When you have gastroesophageal reflux disease (GERD), the foods you eat and your eating habits are very important. Choosing the right foods can help ease your discomfort. What guidelines do I need to follow?  Choose fruits, vegetables, whole grains, and low-fat dairy products.  Choose low-fat meat, fish, and poultry.  Limit fats such as oils, salad dressings, butter, nuts, and avocado.  Keep a food diary. This helps you identify foods that cause symptoms.  Avoid foods that cause symptoms. These may be different for everyone.  Eat small meals often instead of 3 large meals a day.  Eat your meals slowly, in a place where you are relaxed.  Limit fried foods.  Cook foods using methods other than frying.  Avoid drinking alcohol.  Avoid drinking large amounts of liquids with your meals.  Avoid bending over or lying down until 2-3 hours after eating. What foods are not recommended? These are some foods and drinks that may make your symptoms worse: Vegetables  Tomatoes. Tomato juice. Tomato and spaghetti sauce. Chili peppers. Onion and garlic. Horseradish. Fruits  Oranges, grapefruit, and lemon (fruit and juice). Meats  High-fat meats, fish, and poultry. This includes hot dogs, ribs, ham, sausage, salami, and bacon. Dairy  Whole milk and chocolate milk. Sour cream. Cream. Butter. Ice cream. Cream cheese. Drinks  Coffee and tea. Bubbly (carbonated) drinks or energy drinks. Condiments  Hot sauce. Barbecue sauce. Sweets/Desserts  Chocolate and cocoa. Donuts. Peppermint and spearmint. Fats and Oils  High-fat foods. This includes French fries and potato chips. Other  Vinegar. Strong spices. This includes black pepper, white pepper, red pepper, cayenne, curry powder, cloves, ginger, and chili powder. The items listed above may not be a complete list of foods and drinks to avoid. Contact your dietitian for more information.    This information is not intended to replace advice given to you by your health care provider. Make sure you discuss any questions you have with your health care provider. Document Released: 03/13/2012 Document Revised: 02/18/2016 Document Reviewed: 07/17/2013 Elsevier Interactive Patient Education  2017 Elsevier Inc.  

## 2017-12-20 NOTE — Progress Notes (Signed)
Subjective:  Patient ID: Lisa Anderson, female    DOB: 1956/05/20  Age: 62 y.o. 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  She presented to the ED with aphasia lasting about 15-20 minutes while at her mental health care appointment at [facility] for which EMS was called.  On presentation to the ED systolic blood pressure was in the 160-180 range.  CT head, MRI of the brain were negative for acute stroke, MRA revealed subclavian stenosis, carotid Doppler negative for subclavian syndrome but revealed >50% left subclavian stenosis  MRI/ MRA of the head and neck : IMPRESSION: MRI HEAD:  1. No acute intracranial process. 2. Multifocal old small RIGHT MCA territory infarcts. 3. Mild chronic small vessel ischemic disease.  MRA HEAD:  1. No emergent large vessel occlusion. 2. Stable severe stenosis distal RIGHT V4 segment and RIGHT M3 origin. Moderate stenosis RIGHT M1 segment. 3. 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  MRA NECK:  1. No hemodynamically significant stenosis carotid artery's. 2. Severe stenosis LEFT subclavian artery distal to vertebral artery takeoff.   Echocardiogram:  Study Conclusions  - Left ventricle: The cavity size was normal. Systolic function was   normal. The estimated ejection fraction was in the range of 60%   to 65%. Wall motion was normal; there were no regional wall   motion abnormalities. Doppler parameters are consistent with   abnormal left ventricular relaxation (grade 1 diastolic   dysfunction).  Impressions:  - No cardiac source of emboli was  indentified.   Seen by neurology with recommendations for aspirin and Plavix for 3 weeks and continue Plavix thereafter. She was referred to vascular surgery and has an upcoming appointment on 01/08/18. 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 Complaints of epigastric burning which is intermittent but endorses going right to bed in the mornings after eating as she does third shift at work.  Past Medical History:  Diagnosis Date  . Asthma   . Cancer ([place])    breast  . Chronic congestive heart failure with left ventricular diastolic dysfunction ([place])   . Diabetes mellitus   . Hyperlipidemia   . Hypertension   . Stroke [facility])     Past Surgical History:  Procedure Laterality Date  . BREAST LUMPECTOMY    . EP IMPLANTABLE DEVICE N/A 08/07/2015   Procedure: Loop Recorder Insertion;  Surgeon: Thompson Grayer, MD;  Location: [place] CV LAB;  Service: Cardiovascular;  Laterality: N/A;  . TEE WITHOUT CARDIOVERSION N/A 08/07/2015   Procedure: TRANSESOPHAGEAL ECHOCARDIOGRAM (TEE);  Surgeon: Sueanne Margarita, MD;  Location: [facility] ENDOSCOPY;  Service: Cardiovascular;  Laterality: N/A;  . TONSILLECTOMY      Allergies  Allergen Reactions  . Prednisone Hives and Other (See Comments)    Made her "crazy", suicidal  . Benicar [Olmesartan] Other (See Comments)    Did not work at all for patient  . Celexa [Citalopram] Other (See Comments)    Made patient feel crazy, fluoxetine is fine  .  Effexor [Venlafaxine] Other (See Comments)    Made patient feel crazy  . Shrimp [Shellfish Allergy] Hives and Swelling     Outpatient Medications Prior to Visit  Medication Sig Dispense Refill  . acidophilus (RISAQUAD) CAPS capsule Take 1 capsule by mouth daily.    Marland Kitchen aspirin EC 81 MG EC tablet Take 1 tablet (81 mg total) by mouth daily. 21 tablet 0  . Blood  Glucose Monitoring Suppl (CONTOUR NEXT MONITOR) w/Device KIT 1 strip 3 (three) times daily by Does not apply route. E11.9 1 kit 0  . Cholecalciferol (VITAMIN D PO) Take 1-2 tablets by mouth daily.    Marland Kitchen glucose blood (CONTOUR NEXT TEST) test strip Use as instructed 3 times daily. E11.9 100 each 12  . OVER THE COUNTER MEDICATION Take 1-2 tablets by mouth daily. Vitafusion    . albuterol (PROVENTIL HFA;VENTOLIN HFA) 108 (90 Base) MCG/ACT inhaler Inhale 2 puffs into the lungs every 6 (six) hours as needed for wheezing or shortness of breath. Shortness of breath 1 Inhaler 3  . clopidogrel (PLAVIX) 75 MG tablet Take 1 tablet (75 mg total) by mouth daily. 30 tablet 3  . diltiazem (CARDIZEM CD) 360 MG 24 hr capsule Take 1 capsule (360 mg total) by mouth daily. 30 capsule 3  . DULoxetine (CYMBALTA) 60 MG capsule Take 1 capsule (60 mg total) by mouth daily. For mood control 30 capsule 3  . gabapentin (NEURONTIN) 300 MG capsule TAKE 1 CAPSULE BY MOUTH TWICE DAILY 60 capsule 0  . glipiZIDE (GLUCOTROL) 5 MG tablet Take 1 tablet (5 mg total) by mouth 2 (two) times daily before a meal. 60 tablet 3  . hydrALAZINE (APRESOLINE) 50 MG tablet Take 1 tablet (50 mg total) by mouth 2 (two) times daily. For High blood pressure 90 tablet 3  . hydrochlorothiazide (MICROZIDE) 12.5 MG capsule Take 1 capsule (12.5 mg total) by mouth daily. 30 capsule 3  . insulin aspart (NOVOLOG) 100 UNIT/ML injection Sliding scale CBG 70 - 120: 0 units CBG 121 - 150: 1 unit,  CBG 151 - 200: 2 units,  CBG 201 - 250: 3 units,  CBG 251 - 300: 5 units,  CBG 301 - 350: 7 units,  CBG 351 - 400: 9 units   CBG > 400: 9 units and notify your MD 1 vial 12  . insulin glargine (LANTUS) 100 UNIT/ML injection Inject 0.2 mLs (20 Units total) into the skin at bedtime. (Patient taking differently: Inject 15 Units into the skin at bedtime. ) 30 mL 3  . KLOR-CON M20 20 MEQ tablet TAKE 1 TABLET BY MOUTH ONCE DAILY 30 tablet 0  . metFORMIN (GLUCOPHAGE) 1000 MG  tablet Take 1 tablet (1,000 mg total) 2 (two) times daily with a meal by mouth. 60 tablet 3  . rosuvastatin (CRESTOR) 40 MG tablet Take 1 tablet (40 mg total) by mouth daily. 30 tablet 3  . traZODone (DESYREL) 50 MG tablet Take 1 tablet (50 mg total) by mouth at bedtime as needed for sleep. 30 tablet 3   No facility-administered medications prior to visit.     ROS Review of Systems  Constitutional: Negative for activity change, appetite change and fatigue.  HENT: Negative for congestion, sinus pressure and sore throat.   Eyes: Negative for visual disturbance.  Respiratory: Negative for cough, chest tightness, shortness of breath and wheezing.   Cardiovascular: Negative for chest pain and palpitations.  Gastrointestinal: Negative for abdominal distention, abdominal pain and constipation.  Endocrine: Negative for polydipsia.  Genitourinary:  Negative for dysuria and frequency.  Musculoskeletal: Negative for arthralgias and back pain.  Skin: Negative for rash.  Neurological: Positive for weakness and numbness. Negative for tremors and light-headedness.  Hematological: Does not bruise/bleed easily.  Psychiatric/Behavioral: Negative for agitation and behavioral problems.    Objective:  BP (!) 149/63   Pulse 92   Temp 98 F (36.7 C) (Oral)   Ht '5\' 5"'  (1.651 m)   Wt 161 lb 3.2 oz (73.1 kg)   SpO2 97%   BMI 26.83 kg/m   BP/Weight 12/20/2017 12/07/2017 6/38/7564  Systolic BP 332 951 -  Diastolic BP 63 71 -  Wt. (Lbs) 161.2 - 159.61  BMI 26.83 - 26.56  Some encounter information is confidential and restricted. Go to Review Flowsheets activity to see all data.      Physical Exam  Constitutional: She is oriented to person, place, and time. She appears well-developed and well-nourished.  Cardiovascular: Normal rate, normal heart sounds and intact distal pulses.  No murmur heard. Pulmonary/Chest: Effort normal and breath sounds normal. She has no wheezes. She has no rales. She  exhibits no tenderness.  Abdominal: Soft. Bowel sounds are normal. She exhibits no distension and no mass. There is no tenderness.  Musculoskeletal: Normal range of motion.  Neurological: She is alert and oriented to person, place, and time.  Reduced motor strength in left lower extremity  Skin: Skin is warm and dry.  Psychiatric: She has a normal mood and affect.     CMP Latest Ref Rng & Units 12/06/2017 12/05/2017 10/12/2017  Glucose 65 - 99 mg/dL 251(H) 154(H) 181(H)  BUN 6 - 20 mg/dL '10 9 12  ' Creatinine 0.44 - 1.00 mg/dL 1.37(H) 1.07(H) 1.02(H)  Sodium 135 - 145 mmol/L 139 140 139  Potassium 3.5 - 5.1 mmol/L 3.7 3.1(L) 3.5  Chloride 101 - 111 mmol/L 104 103 102  CO2 22 - 32 mmol/L '26 26 26  ' Calcium 8.9 - 10.3 mg/dL 8.7(L) 9.0 9.0  Total Protein 6.5 - 8.1 g/dL - 6.9 -  Total Bilirubin 0.3 - 1.2 mg/dL - 0.3 -  Alkaline Phos 38 - 126 U/L - 96 -  AST 15 - 41 U/L - 21 -  ALT 14 - 54 U/L - 19 -    Lipid Panel     Component Value Date/Time   CHOL 98 12/06/2017 0230   TRIG 184 (H) 12/06/2017 0230   HDL 41 12/06/2017 0230   CHOLHDL 2.4 12/06/2017 0230   VLDL 37 12/06/2017 0230   LDLCALC 20 12/06/2017 0230    Lab Results  Component Value Date   HGBA1C 7.6 (H) 12/06/2017    Assessment & Plan:   1. Type 2 diabetes mellitus with 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 gangrene, with long-term current use of insulin (HCC) Controlled with A1c of 7.6 Continue diabetic diet, lifestyle modifications - POCT glucose (manual entry) - insulin glargine (LANTUS) 100 UNIT/ML injection; Inject 0.2 mLs (20 Units total) into the skin at bedtime.  Dispense: 30 mL; Refill: 3 - metFORMIN (GLUCOPHAGE) 1000 MG tablet; Take 1 tablet (1,000 mg total) by mouth 2 (two) times daily with a meal.  Dispense: 60 tablet; Refill: 3 - insulin aspart (NOVOLOG) 100 UNIT/ML injection; As per sliding scale  Dispense: 1 vial; Refill: 12 - glipiZIDE (GLUCOTROL) 5 MG tablet; Take 1 tablet (5 mg total) by mouth 2  (two) times daily before a meal.  Dispense: 60 tablet; Refill: 3 - gabapentin (NEURONTIN) 300 MG capsule; Take 2 capsules (600 mg total) by mouth  2 (two) times daily.  Dispense: 120 capsule; Refill: 3  2. Pure hypercholesterolemia Controlled Low-cholesterol diet - rosuvastatin (CRESTOR) 40 MG tablet; Take 1 tablet (40 mg total) by mouth daily.  Dispense: 30 tablet; Refill: 3  3. Essential hypertension Slightly elevated No regimen change today Low-sodium, DASH diet, lifestyle modification - hydrochlorothiazide (MICROZIDE) 12.5 MG capsule; Take 1 capsule (12.5 mg total) by mouth daily.  Dispense: 30 capsule; Refill: 3 - hydrALAZINE (APRESOLINE) 50 MG tablet; Take 1 tablet (50 mg total) by mouth 2 (two) times daily. For High blood pressure  Dispense: 90 tablet; Refill: 3  4. Recurrent major depressive disorder, remission status unspecified (HCC) Stable Follow[facility] health at [facility] - DULoxetine (CYMBALTA) 60 MG capsule; Take 1 capsule (60 mg total) by mouth daily. For mood control  Dispense: 30 capsule; Refill: 3  5. Chronic diastolic CHF (congestive heart failure) (HCC) EF 40-98% grade 1 diastolic dysfunction from echo 11/2017 - diltiazem (CARDIZEM CD) 360 MG 24 hr capsule; Take 1 capsule (360 mg total) by mouth daily.  Dispense: 30 capsule; Refill: 3 - potassium chloride SA (KLOR-CON M20) 20 MEQ tablet; Take 1 tablet (20 mEq total) by mouth daily.  Dispense: 30 tablet; Refill: 3  6. History of stroke With slight left lower extremity weakness Risk factor modification Continue aspirin and Plavix until end of first week in April then continue Plavix alone - clopidogrel (PLAVIX) 75 MG tablet; Take 1 tablet (75 mg total) by mouth daily.  Dispense: 30 tablet; Refill: 3  7. Mild intermittent asthma without complication Stable - albuterol (PROVENTIL HFA;VENTOLIN HFA) 108 (90 Base) MCG/ACT inhaler; Inhale 2 puffs into the lungs every 6 (six) hours as needed for wheezing or shortness of  breath. Shortness of breath  Dispense: 1 Inhaler; Refill: 3  8. Hypokalemia Currently on potassium replacement Likely from hydrochlorothiazide - Basic Metabolic Panel  9. Gastroesophageal reflux disease without esophagitis Advised to avoid recumbency up to 2 hours after meals; this is a challenge as she works third shift and goes right to bed after eating - ranitidine (ZANTAC) 150 MG tablet; Take 1 tablet (150 mg total) by mouth 2 (two) times daily.  Dispense: 60 tablet; Refill: 3   Meds ordered this encounter  Medications  . ranitidine (ZANTAC) 150 MG tablet    Sig: Take 1 tablet (150 mg total) by mouth 2 (two) times daily.    Dispense:  60 tablet    Refill:  3  . nicotine (NICODERM CQ) 7 mg/24hr patch    Sig: Place 1 patch (7 mg total) onto the skin daily.    Dispense:  28 patch    Refill:  0  . traZODone (DESYREL) 50 MG tablet    Sig: Take 1 tablet (50 mg total) by mouth at bedtime as needed for sleep.    Dispense:  30 tablet    Refill:  3  . insulin glargine (LANTUS) 100 UNIT/ML injection    Sig: Inject 0.2 mLs (20 Units total) into the skin at bedtime.    Dispense:  30 mL    Refill:  3  . rosuvastatin (CRESTOR) 40 MG tablet    Sig: Take 1 tablet (40 mg total) by mouth daily.    Dispense:  30 tablet    Refill:  3  . metFORMIN (GLUCOPHAGE) 1000 MG tablet    Sig: Take 1 tablet (1,000 mg total) by mouth 2 (two) times daily with a meal.    Dispense:  60 tablet    Refill:  3  . insulin aspart (NOVOLOG) 100 UNIT/ML injection    Sig: As per sliding scale    Dispense:  1 vial    Refill:  12  . hydrochlorothiazide (MICROZIDE) 12.5 MG capsule    Sig: Take 1 capsule (12.5 mg total) by mouth daily.    Dispense:  30 capsule    Refill:  3  . hydrALAZINE (APRESOLINE) 50 MG tablet    Sig: Take 1 tablet (50 mg total) by mouth 2 (two) times daily. For High blood pressure    Dispense:  90 tablet    Refill:  3  . glipiZIDE (GLUCOTROL) 5 MG tablet    Sig: Take 1 tablet (5 mg total)  by mouth 2 (two) times daily before a meal.    Dispense:  60 tablet    Refill:  3  . gabapentin (NEURONTIN) 300 MG capsule    Sig: Take 2 capsules (600 mg total) by mouth 2 (two) times daily.    Dispense:  120 capsule    Refill:  3    Please consider 90 day supplies to promote better adherence  . DULoxetine (CYMBALTA) 60 MG capsule    Sig: Take 1 capsule (60 mg total) by mouth daily. For mood control    Dispense:  30 capsule    Refill:  3  . diltiazem (CARDIZEM CD) 360 MG 24 hr capsule    Sig: Take 1 capsule (360 mg total) by mouth daily.    Dispense:  30 capsule    Refill:  3  . clopidogrel (PLAVIX) 75 MG tablet    Sig: Take 1 tablet (75 mg total) by mouth daily.    Dispense:  30 tablet    Refill:  3  . albuterol (PROVENTIL HFA;VENTOLIN HFA) 108 (90 Base) MCG/ACT inhaler    Sig: Inhale 2 puffs into the lungs every 6 (six) hours as needed for wheezing or shortness of breath. Shortness of breath    Dispense:  1 Inhaler    Refill:  3  . potassium chloride SA (KLOR-CON M20) 20 MEQ tablet    Sig: Take 1 tablet (20 mEq total) by mouth daily.    Dispense:  30 tablet    Refill:  3    Please consider 90 day supplies to promote better adherence    Follow-up: Return in about 3 months (around 03/22/2018) for Follow up of chronic medical conditions.   Charlott Rakes MD

## 2017-12-21 ENCOUNTER — Telehealth: Payer: Self-pay

## 2017-12-21 LAB — BASIC METABOLIC PANEL
BUN / CREAT RATIO: 13 (ref 12–28)
BUN: 16 mg/dL (ref 8–27)
CHLORIDE: 101 mmol/L (ref 96–106)
CO2: 25 mmol/L (ref 20–29)
Calcium: 9.1 mg/dL (ref 8.7–10.3)
Creatinine, Ser: 1.19 mg/dL — ABNORMAL HIGH (ref 0.57–1.00)
GFR calc non Af Amer: 50 mL/min/{1.73_m2} — ABNORMAL LOW (ref >59)
GFR, EST AFRICAN AMERICAN: 57 mL/min/{1.73_m2} — AB (ref >59)
GLUCOSE: 249 mg/dL — AB (ref 65–99)
POTASSIUM: 4.1 mmol/L (ref 3.5–5.2)
Sodium: 141 mmol/L (ref 134–144)

## 2017-12-21 NOTE — Telephone Encounter (Signed)
[facility] was called and informed of lab results.

## 2017-12-26 ENCOUNTER — Telehealth: Payer: Self-pay | Admitting: Cardiology

## 2017-12-26 NOTE — Telephone Encounter (Signed)
Attempted to call pt b/c her home monitor has not updated in at least 14 days. No answer and unable to leave a message.  

## 2017-12-28 ENCOUNTER — Ambulatory Visit (INDEPENDENT_AMBULATORY_CARE_PROVIDER_SITE_OTHER): Payer: BLUE CROSS/BLUE SHIELD | Admitting: *Deleted

## 2017-12-28 DIAGNOSIS — I63011 Cerebral infarction due to thrombosis of right vertebral artery: Secondary | ICD-10-CM

## 2018-01-01 NOTE — Progress Notes (Signed)
[facility] Summary Report / Loop Recorder 

## 2018-01-08 ENCOUNTER — Ambulatory Visit: Payer: BLUE CROSS/BLUE SHIELD | Admitting: Surgery

## 2018-01-08 ENCOUNTER — Other Ambulatory Visit: Payer: Self-pay

## 2018-01-08 ENCOUNTER — Encounter: Payer: Self-pay | Admitting: Surgery

## 2018-01-08 VITALS — BP 166/90 | HR 85 | Temp 98.4°F | Resp 16 | Ht 65.0 in | Wt 162.0 lb

## 2018-01-08 DIAGNOSIS — I771 Stricture of artery: Secondary | ICD-10-CM

## 2018-01-08 NOTE — Progress Notes (Signed)
[facility] [place]  Patient name: Lisa Anderson MRN: [id number] DOB: 14-Feb-1956 Sex: female   REQUESTING PROVIDER:    Dr. Tyrell Antonio   REASON FOR CONSULT:    Left subclavian stenosis  HISTORY OF PRESENT ILLNESS:   Lisa Anderson is a 62 y.o. 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  She presented with a aphasia.  Carotid Doppler studies showed 1-39% carotid stenosis bilaterally.  She has an implantable loop recorder in place.  Patient suffers from type 2 diabetes.  Her most recent hemoglobin A1c was 7.6.  She is medically managed for hypertension.  She takes a statin for hypercholesterolemia.  She has a history of diastolic heart failure with ejection fraction of approximately 65%.  PAST MEDICAL HISTORY    Past Medical History:  Diagnosis Date  . Asthma   . Cancer ([place])    breast  . Chronic congestive heart failure with left ventricular diastolic dysfunction ([place])   . Diabetes mellitus   . Hyperlipidemia   . Hypertension   . Stroke [facility])      FAMILY HISTORY   Family History  Problem Relation Age of Onset  . Diabetes Mother   . Hyperlipidemia Mother   . Hypertension Mother   . Breast cancer Mother   . Rheum arthritis Father   . Asthma Father   . Stroke Father   . Thyroid disease Father   . Thyroid disease Brother   . Colon cancer Brother        in his 45s  . Breast cancer Sister     SOCIAL HISTORY:   Social History   Socioeconomic History  . Marital status: Single    Spouse name: Not on file  . Number of children: 5  . Years of education: 61  . Highest education level: Not on file  Occupational History  . Occupation: CNA- [employer]  . Financial resource strain: Not on file  . Food insecurity:    Worry: Not on file    Inability: Not on file  .  Transportation needs:    Medical: Not on file    Non-medical: Not on file  Tobacco Use  . Smoking status: Current Every Day Smoker    Packs/day: 0.50    Types: Cigarettes  . Smokeless tobacco: Never Used  . Tobacco comment: 10 cigarettes per day  Substance and Sexual Activity  . Alcohol use: Yes    Alcohol/week: 3.6 oz    Types: 6 Cans of beer per week    Comment: occasionally  . Drug use: No    Types: "Crack" cocaine, Cocaine    Comment: last used 3 months ago.  Marland Kitchen Sexual activity: Never    Comment: recovering cocaine addict x6 months  Lifestyle  . Physical activity:    Days per week: Not on file    Minutes per session: Not on file  . Stress: Not on file  Relationships  . Social connections:    Talks on phone: Not on file    Gets together: Not on file    Attends religious service: Not on file    Active member of club or organization: Not on file    Attends meetings of clubs or organizations: Not on file    Relationship status: Not on file  . Intimate partner violence:    Fear of current or ex partner: Not  on file    Emotionally abused: Not on file    Physically abused: Not on file    Forced sexual activity: Not on file  Other Topics Concern  . Not on file  Social History Narrative   Lives with daughter      Caffeine use: Drinks soda (2- 20 oz soda per day)    ALLERGIES:    Allergies  Allergen Reactions  . Prednisone Hives and Other (See Comments)    Made her "crazy", suicidal  . Benicar [Olmesartan] Other (See Comments)    Did not work at all for patient  . Celexa [Citalopram] Other (See Comments)    Made patient feel crazy, fluoxetine is fine  . Effexor [Venlafaxine] Other (See Comments)    Made patient feel crazy  . Shrimp [Shellfish Allergy] Hives and Swelling    CURRENT MEDICATIONS:    Current Outpatient Medications  Medication Sig Dispense Refill  . acidophilus (RISAQUAD) CAPS capsule Take 1 capsule by mouth daily.    Marland Kitchen albuterol (PROVENTIL  HFA;VENTOLIN HFA) 108 (90 Base) MCG/ACT inhaler Inhale 2 puffs into the lungs every 6 (six) hours as needed for wheezing or shortness of breath. Shortness of breath 1 Inhaler 3  . aspirin EC 81 MG EC tablet Take 1 tablet (81 mg total) by mouth daily. 21 tablet 0  . Blood Glucose Monitoring Suppl (CONTOUR NEXT MONITOR) w/Device KIT 1 strip 3 (three) times daily by Does not apply route. E11.9 1 kit 0  . Cholecalciferol (VITAMIN D PO) Take 1-2 tablets by mouth daily.    . clopidogrel (PLAVIX) 75 MG tablet Take 1 tablet (75 mg total) by mouth daily. 30 tablet 3  . diltiazem (CARDIZEM CD) 360 MG 24 hr capsule Take 1 capsule (360 mg total) by mouth daily. 30 capsule 3  . DULoxetine (CYMBALTA) 60 MG capsule Take 1 capsule (60 mg total) by mouth daily. For mood control 30 capsule 3  . gabapentin (NEURONTIN) 300 MG capsule Take 2 capsules (600 mg total) by mouth 2 (two) times daily. 120 capsule 3  . glipiZIDE (GLUCOTROL) 5 MG tablet Take 1 tablet (5 mg total) by mouth 2 (two) times daily before a meal. 60 tablet 3  . glucose blood (CONTOUR NEXT TEST) test strip Use as instructed 3 times daily. E11.9 100 each 12  . hydrALAZINE (APRESOLINE) 50 MG tablet Take 1 tablet (50 mg total) by mouth 2 (two) times daily. For High blood pressure 90 tablet 3  . hydrochlorothiazide (MICROZIDE) 12.5 MG capsule Take 1 capsule (12.5 mg total) by mouth daily. 30 capsule 3  . insulin aspart (NOVOLOG) 100 UNIT/ML injection As per sliding scale 1 vial 12  . insulin glargine (LANTUS) 100 UNIT/ML injection Inject 0.2 mLs (20 Units total) into the skin at bedtime. 30 mL 3  . metFORMIN (GLUCOPHAGE) 1000 MG tablet Take 1 tablet (1,000 mg total) by mouth 2 (two) times daily with a meal. 60 tablet 3  . nicotine (NICODERM CQ) 7 mg/24hr patch Place 1 patch (7 mg total) onto the skin daily. 28 patch 0  . OVER THE COUNTER MEDICATION Take 1-2 tablets by mouth daily. Vitafusion    . potassium chloride SA (KLOR-CON M20) 20 MEQ tablet Take 1  tablet (20 mEq total) by mouth daily. 30 tablet 3  . ranitidine (ZANTAC) 150 MG tablet Take 1 tablet (150 mg total) by mouth 2 (two) times daily. 60 tablet 3  . rosuvastatin (CRESTOR) 40 MG tablet Take 1 tablet (40 mg total) by mouth  daily. 30 tablet 3  . traZODone (DESYREL) 50 MG tablet Take 1 tablet (50 mg total) by mouth at bedtime as needed for sleep. 30 tablet 3   No current facility-administered medications for this visit.     REVIEW OF SYSTEMS:   '[X]'  denotes positive finding, '[ ]'  denotes negative finding Cardiac  Comments:  Chest pain or chest pressure:    Shortness of breath upon exertion:    Short of breath when lying flat:    Irregular heart rhythm: x       Vascular    Pain in calf, thigh, or hip brought on by ambulation: x   Pain in feet at night that wakes you up from your sleep:     Blood clot in your veins:    Leg swelling:         Pulmonary    Oxygen at home:    Productive cough:     Wheezing:         Neurologic    Sudden weakness in arms or legs:     Sudden numbness in arms or legs:     Sudden onset of difficulty speaking or slurred speech:    Temporary loss of vision in one eye:     Problems with dizziness:         Gastrointestinal    Blood in stool:      Vomited blood:         Genitourinary    Burning when urinating:     Blood in urine:        Psychiatric    Major depression:  x       Hematologic    Bleeding problems:    Problems with blood clotting too easily:        Skin    Rashes or ulcers:        Constitutional    Fever or chills:     PHYSICAL EXAM:   Vitals:   01/08/18 1130 01/08/18 1132 01/08/18 1209 01/08/18 1210  BP: (!) 171/86 (!) 171/82 (!) 147/83 (!) 166/90  Pulse: 85     Resp: 16     Temp: 98.4 F (36.9 C)     TempSrc: Oral     SpO2: 96%     Weight: 162 lb (73.5 kg)     Height: '5\' 5"'  (1.651 m)       GENERAL: The patient is a well-nourished female, in no acute distress. The vital signs are documented above. CARDIAC:  There is a regular rate and rhythm.  VASCULAR: Palpable radial pulse bilaterally.  Left subclavian bruit.  No carotid bruits. PULMONARY: Nonlabored respirations MUSCULOSKELETAL: There are no major deformities or cyanosis. NEUROLOGIC: No focal weakness or paresthesias are detected. SKIN: There are no ulcers or rashes noted. PSYCHIATRIC: The patient has a normal affect.  STUDIES:   I have reviewed her MRI with the following findings: MRI HEAD:  1. No acute intracranial process. 2. Multifocal old small RIGHT MCA territory infarcts. 3. Mild chronic small vessel ischemic disease. MRA HEAD:  1. No emergent large vessel occlusion. 2. Stable severe stenosis distal RIGHT V4 segment and RIGHT M3 origin. Moderate stenosis RIGHT M1 segment. 3. 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  MRA NECK:  1. No hemodynamically significant stenosis carotid artery's. 2. Severe stenosis LEFT subclavian artery distal to vertebral artery takeoff.    ASSESSMENT and PLAN   Left subclavian artery stenosis: Her lesion is distal to her vertebral artery, so this could not  cause any neurologic symptoms.  She denies any weakness and her left arm consistent with claudication.  No intervention is needed for her left subclavian artery stenosis.  We discussed monitoring her pulse and looking out for weakness in her left arm with activity.  She will follow-up with me in 1 year for repeat ultrasound evaluation.   Annamarie Major, MD [facility] [phone] Pager [phone]

## 2018-01-16 ENCOUNTER — Telehealth: Payer: Self-pay | Admitting: Cardiology

## 2018-01-16 NOTE — Telephone Encounter (Signed)
Attempted to call pt b/c her home monitor has not updated in at least 14 days. No answer and unable to leave a message.  

## 2018-01-23 ENCOUNTER — Encounter: Payer: Self-pay | Admitting: Cardiology

## 2018-01-30 ENCOUNTER — Ambulatory Visit (INDEPENDENT_AMBULATORY_CARE_PROVIDER_SITE_OTHER): Payer: BLUE CROSS/BLUE SHIELD | Admitting: *Deleted

## 2018-01-30 DIAGNOSIS — I63011 Cerebral infarction due to thrombosis of right vertebral artery: Secondary | ICD-10-CM | POA: Diagnosis not present

## 2018-01-31 LAB — CUP PACEART REMOTE DEVICE CHECK
Date Time Interrogation Session: 20190404221035
Implantable Pulse Generator Implant Date: 20161111

## 2018-01-31 NOTE — Progress Notes (Signed)
[facility] Summary Report / Loop Recorder 

## 2018-02-07 ENCOUNTER — Encounter: Payer: Self-pay | Admitting: Cardiology

## 2018-02-20 LAB — CUP PACEART REMOTE DEVICE CHECK
Implantable Pulse Generator Implant Date: 20161111
MDC IDC SESS DTM: 20190507220722

## 2018-03-05 ENCOUNTER — Ambulatory Visit (INDEPENDENT_AMBULATORY_CARE_PROVIDER_SITE_OTHER): Payer: BLUE CROSS/BLUE SHIELD | Admitting: *Deleted

## 2018-03-05 DIAGNOSIS — I63011 Cerebral infarction due to thrombosis of right vertebral artery: Secondary | ICD-10-CM | POA: Diagnosis not present

## 2018-03-05 NOTE — Progress Notes (Signed)
[facility] Summary Report / Loop Recorder 

## 2018-03-08 ENCOUNTER — Telehealth: Payer: Self-pay | Admitting: Cardiology

## 2018-03-08 NOTE — Telephone Encounter (Signed)
Spoke w/ pt and requested that she send a manual transmission b/c her home monitor has not updated in at least 14 days.   

## 2018-03-27 ENCOUNTER — Ambulatory Visit: Payer: BLUE CROSS/BLUE SHIELD | Attending: Family Medicine | Admitting: Family Medicine

## 2018-03-27 ENCOUNTER — Encounter: Payer: Self-pay | Admitting: Family Medicine

## 2018-03-27 VITALS — BP 102/60 | HR 91 | Temp 98.2°F | Ht 65.0 in | Wt 155.4 lb

## 2018-03-27 DIAGNOSIS — K089 Disorder of teeth and supporting structures, unspecified: Secondary | ICD-10-CM

## 2018-03-27 DIAGNOSIS — Z7982 Long term (current) use of aspirin: Secondary | ICD-10-CM | POA: Insufficient documentation

## 2018-03-27 DIAGNOSIS — Z794 Long term (current) use of insulin: Secondary | ICD-10-CM | POA: Diagnosis not present

## 2018-03-27 DIAGNOSIS — Z8673 Personal history of transient ischemic attack (TIA), and cerebral infarction without residual deficits: Secondary | ICD-10-CM | POA: Insufficient documentation

## 2018-03-27 DIAGNOSIS — E7800 Pure hypercholesterolemia, unspecified: Secondary | ICD-10-CM | POA: Diagnosis not present

## 2018-03-27 DIAGNOSIS — E119 Type 2 diabetes mellitus without complications: Secondary | ICD-10-CM

## 2018-03-27 DIAGNOSIS — Z79899 Other long term (current) drug therapy: Secondary | ICD-10-CM | POA: Diagnosis not present

## 2018-03-27 DIAGNOSIS — K0889 Other specified disorders of teeth and supporting structures: Secondary | ICD-10-CM | POA: Diagnosis not present

## 2018-03-27 DIAGNOSIS — Z7902 Long term (current) use of antithrombotics/antiplatelets: Secondary | ICD-10-CM | POA: Diagnosis not present

## 2018-03-27 DIAGNOSIS — I5032 Chronic diastolic (congestive) heart failure: Secondary | ICD-10-CM | POA: Diagnosis not present

## 2018-03-27 DIAGNOSIS — F339 Major depressive disorder, recurrent, unspecified: Secondary | ICD-10-CM | POA: Diagnosis not present

## 2018-03-27 DIAGNOSIS — I10 Essential (primary) hypertension: Secondary | ICD-10-CM

## 2018-03-27 DIAGNOSIS — J4520 Mild intermittent asthma, uncomplicated: Secondary | ICD-10-CM | POA: Insufficient documentation

## 2018-03-27 DIAGNOSIS — K219 Gastro-esophageal reflux disease without esophagitis: Secondary | ICD-10-CM | POA: Diagnosis not present

## 2018-03-27 DIAGNOSIS — I110 Hypertensive heart disease with heart failure: Secondary | ICD-10-CM | POA: Insufficient documentation

## 2018-03-27 DIAGNOSIS — E1151 Type 2 diabetes mellitus with 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 gangrene: Secondary | ICD-10-CM | POA: Insufficient documentation

## 2018-03-27 DIAGNOSIS — G8929 Other chronic pain: Secondary | ICD-10-CM | POA: Diagnosis not present

## 2018-03-27 DIAGNOSIS — Z1239 Encounter for other screening for malignant neoplasm of breast: Secondary | ICD-10-CM

## 2018-03-27 DIAGNOSIS — Z1231 Encounter for screening mammogram for malignant neoplasm of breast: Secondary | ICD-10-CM | POA: Diagnosis not present

## 2018-03-27 LAB — POCT GLYCOSYLATED HEMOGLOBIN (HGB A1C): HBA1C, POC (CONTROLLED DIABETIC RANGE): 8.9 % — AB (ref 0.0–7.0)

## 2018-03-27 LAB — GLUCOSE, POCT (MANUAL RESULT ENTRY): POC Glucose: 149 mg/dl — AB (ref 70–99)

## 2018-03-27 MED ORDER — HYDROCHLOROTHIAZIDE 12.5 MG PO CAPS
12.5000 mg | ORAL_CAPSULE | Freq: Every day | ORAL | 3 refills | Status: DC
Start: 2018-03-27 — End: 2018-09-22

## 2018-03-27 MED ORDER — RANITIDINE HCL 150 MG PO TABS: 150.0000 mg | ORAL_TABLET | Freq: Two times a day (BID) | ORAL | 3 refills | Status: DC

## 2018-03-27 MED ORDER — HYDRALAZINE HCL 50 MG PO TABS
50.0000 mg | ORAL_TABLET | Freq: Two times a day (BID) | ORAL | 3 refills | Status: DC
Start: 2018-03-27 — End: 2018-09-22

## 2018-03-27 MED ORDER — ALBUTEROL SULFATE HFA 108 (90 BASE) MCG/ACT IN AERS: 2.0000 | INHALATION_SPRAY | Freq: Four times a day (QID) | RESPIRATORY_TRACT | 3 refills | Status: DC | PRN

## 2018-03-27 MED ORDER — DULOXETINE HCL 60 MG PO CPEP: 60.0000 mg | ORAL_CAPSULE | Freq: Every day | ORAL | 3 refills | Status: DC

## 2018-03-27 MED ORDER — GLIPIZIDE 10 MG PO TABS
10.0000 mg | ORAL_TABLET | Freq: Two times a day (BID) | ORAL | 3 refills | Status: DC
Start: 2018-03-27 — End: 2018-09-22

## 2018-03-27 MED ORDER — POTASSIUM CHLORIDE CRYS ER 20 MEQ PO TBCR
20.0000 meq | EXTENDED_RELEASE_TABLET | Freq: Every day | ORAL | 3 refills | Status: DC
Start: 2018-03-27 — End: 2019-01-16

## 2018-03-27 MED ORDER — AMOXICILLIN 500 MG PO CAPS: 500.0000 mg | ORAL_CAPSULE | Freq: Three times a day (TID) | ORAL | 0 refills | Status: DC

## 2018-03-27 MED ORDER — TRAZODONE HCL 50 MG PO TABS: 50.0000 mg | ORAL_TABLET | Freq: Every evening | ORAL | 3 refills | Status: DC | PRN

## 2018-03-27 MED ORDER — GABAPENTIN 300 MG PO CAPS: 600.0000 mg | ORAL_CAPSULE | Freq: Two times a day (BID) | ORAL | 3 refills | Status: DC

## 2018-03-27 MED ORDER — DILTIAZEM HCL ER COATED BEADS 360 MG PO CP24: 360.0000 mg | ORAL_CAPSULE | Freq: Every day | ORAL | 3 refills | Status: DC

## 2018-03-27 MED ORDER — INSULIN GLARGINE 100 UNIT/ML ~~LOC~~ SOLN
20.0000 [IU] | Freq: Every day | SUBCUTANEOUS | 3 refills | Status: DC
Start: 2018-03-27 — End: 2018-09-22

## 2018-03-27 MED ORDER — CLOPIDOGREL BISULFATE 75 MG PO TABS: 75.0000 mg | ORAL_TABLET | Freq: Every day | ORAL | 3 refills | Status: DC

## 2018-03-27 MED ORDER — METFORMIN HCL 1000 MG PO TABS: 1000.0000 mg | ORAL_TABLET | Freq: Two times a day (BID) | ORAL | 3 refills | Status: DC

## 2018-03-27 MED ORDER — ROSUVASTATIN CALCIUM 40 MG PO TABS: 40.0000 mg | ORAL_TABLET | Freq: Every day | ORAL | 3 refills | Status: DC

## 2018-03-27 NOTE — Patient Instructions (Signed)
Diabetes Mellitus and Nutrition When you have diabetes (diabetes mellitus), it is very important to have healthy eating habits because your blood sugar (glucose) levels are greatly affected by what you eat and drink. Eating healthy foods in the appropriate amounts, at about the same times every day, can help you:  Control your blood glucose.  Lower your risk of heart disease.  Improve your blood pressure.  Reach or maintain a healthy weight.  Every person with diabetes is different, and each person has different needs for a meal plan. Your health care provider may recommend that you work with a diet and nutrition specialist (dietitian) to make a meal plan that is best for you. Your meal plan may vary depending on factors such as:  The calories you need.  The medicines you take.  Your weight.  Your blood glucose, blood pressure, and cholesterol levels.  Your activity level.  Other health conditions you have, such as heart or kidney disease.  How do carbohydrates affect me? Carbohydrates affect your blood glucose level more than any other type of food. Eating carbohydrates naturally increases the amount of glucose in your blood. Carbohydrate counting is a method for keeping track of how many carbohydrates you eat. Counting carbohydrates is important to keep your blood glucose at a healthy level, especially if you use insulin or take certain oral diabetes medicines. It is important to know how many carbohydrates you can safely have in each meal. This is different for every person. Your dietitian can help you calculate how many carbohydrates you should have at each meal and for snack. Foods that contain carbohydrates include:  Bread, cereal, rice, pasta, and crackers.  Potatoes and corn.  Peas, beans, and lentils.  Milk and yogurt.  Fruit and juice.  Desserts, such as cakes, cookies, ice cream, and candy.  How does alcohol affect me? Alcohol can cause a sudden decrease in blood  glucose (hypoglycemia), especially if you use insulin or take certain oral diabetes medicines. Hypoglycemia can be a life-threatening condition. Symptoms of hypoglycemia (sleepiness, dizziness, and confusion) are similar to symptoms of having too much alcohol. If your health care provider says that alcohol is safe for you, follow these guidelines:  Limit alcohol intake to no more than 1 drink per day for nonpregnant women and 2 drinks per day for men. One drink equals 12 oz of beer, 5 oz of wine, or 1 oz of hard liquor.  Do not drink on an empty stomach.  Keep yourself hydrated with water, diet soda, or unsweetened iced tea.  Keep in mind that regular soda, juice, and other mixers may contain a lot of sugar and must be counted as carbohydrates.  What are tips for following this plan? Reading food labels  Start by checking the serving size on the label. The amount of calories, carbohydrates, fats, and other nutrients listed on the label are based on one serving of the food. Many foods contain more than one serving per package.  Check the total grams (g) of carbohydrates in one serving. You can calculate the number of servings of carbohydrates in one serving by dividing the total carbohydrates by 15. For example, if a food has 30 g of total carbohydrates, it would be equal to 2 servings of carbohydrates.  Check the number of grams (g) of saturated and trans fats in one serving. Choose foods that have low or no amount of these fats.  Check the number of milligrams (mg) of sodium in one serving. Most people   should limit total sodium intake to less than 2,300 mg per day.  Always check the nutrition information of foods labeled as "low-fat" or "nonfat". These foods may be higher in added sugar or refined carbohydrates and should be avoided.  Talk to your dietitian to identify your daily goals for nutrients listed on the label. Shopping  Avoid buying canned, premade, or processed foods. These  foods tend to be high in fat, sodium, and added sugar.  Shop around the outside edge of the grocery store. This includes fresh fruits and vegetables, bulk grains, fresh meats, and fresh dairy. Cooking  Use low-heat cooking methods, such as baking, instead of high-heat cooking methods like deep frying.  Cook using healthy oils, such as olive, canola, or sunflower oil.  Avoid cooking with butter, cream, or high-fat meats. Meal planning  Eat meals and snacks regularly, preferably at the same times every day. Avoid going long periods of time without eating.  Eat foods high in fiber, such as fresh fruits, vegetables, beans, and whole grains. Talk to your dietitian about how many servings of carbohydrates you can eat at each meal.  Eat 4-6 ounces of lean protein each day, such as lean meat, chicken, fish, eggs, or tofu. 1 ounce is equal to 1 ounce of meat, chicken, or fish, 1 egg, or 1/4 cup of tofu.  Eat some foods each day that contain healthy fats, such as avocado, nuts, seeds, and fish. Lifestyle   Check your blood glucose regularly.  Exercise at least 30 minutes 5 or more days each week, or as told by your health care provider.  Take medicines as told by your health care provider.  Do not use any products that contain nicotine or tobacco, such as cigarettes and e-cigarettes. If you need help quitting, ask your health care provider.  Work with a counselor or diabetes educator to identify strategies to manage stress and any emotional and social challenges. What are some questions to ask my health care provider?  Do I need to meet with a diabetes educator?  Do I need to meet with a dietitian?  What number can I call if I have questions?  When are the best times to check my blood glucose? Where to find more information:  American Diabetes Association: diabetes.org/food-and-fitness/food  Academy of Nutrition and Dietetics:  www.eatright.org/resources/health/diseases-and-conditions/diabetes  National Institute of Diabetes and Digestive and Kidney Diseases (NIH): www.niddk.nih.gov/health-information/diabetes/overview/diet-eating-physical-activity Summary  A healthy meal plan will help you control your blood glucose and maintain a healthy lifestyle.  Working with a diet and nutrition specialist (dietitian) can help you make a meal plan that is best for you.  Keep in mind that carbohydrates and alcohol have immediate effects on your blood glucose levels. It is important to count carbohydrates and to use alcohol carefully. This information is not intended to replace advice given to you by your health care provider. Make sure you discuss any questions you have with your health care provider. Document Released: 06/09/2005 Document Revised: 10/17/2016 Document Reviewed: 10/17/2016 Elsevier Interactive Patient Education  2018 [employer]  

## 2018-03-27 NOTE — Progress Notes (Signed)
Subjective:  Patient ID: Lisa Anderson, female    DOB: September 30, 1955  Age: 62 y.o. 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  She denies hypoglycemia, numbness in extremities. She complains of pain in her upper premolars on the right which radiates to her cheek and her right ear and is in the process of contacting her dentist for an appointment.  She does have poor oral hygiene and has dentures in place.  Denies fever. She is doing well on her antihypertensive and tolerating her statin with no adverse effects.  Denies shortness of breath, chest pains or pedal edema and she has no residual weakness from her CVA. With regards to healthcare maintenance she is yet to set up a colonoscopy appointment due to her high co-pay required by her insurance company.  Past Medical History:  Diagnosis Date  . Asthma   . Cancer ([place])    breast  . Chronic congestive heart failure with left ventricular diastolic dysfunction ([place])   . Diabetes mellitus   . Hyperlipidemia   . Hypertension   . Stroke [facility])     Past Surgical History:  Procedure Laterality Date  . BREAST LUMPECTOMY    . EP IMPLANTABLE DEVICE N/A 08/07/2015   Procedure: Loop Recorder Insertion;  Surgeon: Thompson Grayer, MD;  Location: [facility];  Service: Cardiovascular;  Laterality: N/A;  . TEE WITHOUT CARDIOVERSION N/A 08/07/2015   Procedure: TRANSESOPHAGEAL ECHOCARDIOGRAM (TEE);  Surgeon: Sueanne Margarita, MD;  Location: [facility]  ENDOSCOPY;  Service: Cardiovascular;  Laterality: N/A;  . TONSILLECTOMY      Allergies  Allergen Reactions  . Prednisone Hives and Other (See Comments)    Made her "crazy", suicidal  . Benicar [Olmesartan] Other (See Comments)    Did not work at all for patient  . Celexa [Citalopram] Other (See Comments)    Made patient feel crazy, fluoxetine is fine  . Effexor [Venlafaxine] Other (See Comments)    Made patient feel crazy  . Shrimp [Shellfish Allergy] Hives and Swelling     Outpatient Medications Prior to Visit  Medication Sig Dispense Refill  . acidophilus (RISAQUAD) CAPS capsule Take 1 capsule by mouth daily.    Marland Kitchen aspirin EC 81 MG EC tablet Take 1 tablet (81 mg total) by mouth daily. 21 tablet 0  . Blood Glucose Monitoring Suppl (CONTOUR NEXT MONITOR) w/Device KIT 1 strip 3 (three) times daily by Does not apply route. E11.9 1 kit 0  . Cholecalciferol (VITAMIN D PO) Take 1-2 tablets by mouth daily.    Marland Kitchen glucose blood (CONTOUR NEXT TEST) test strip Use as instructed 3 times daily. E11.9 100 each 12  . insulin aspart (NOVOLOG) 100 UNIT/ML injection As per sliding scale 1 vial 12  . nicotine (NICODERM CQ) 7 mg/24hr patch Place 1 patch (7 mg total) onto the skin daily. 28 patch 0  . OVER THE COUNTER MEDICATION Take 1-2 tablets by mouth daily. Vitafusion    . albuterol (PROVENTIL  HFA;VENTOLIN HFA) 108 (90 Base) MCG/ACT inhaler Inhale 2 puffs into the lungs every 6 (six) hours as needed for wheezing or shortness of breath. Shortness of breath 1 Inhaler 3  . clopidogrel (PLAVIX) 75 MG tablet Take 1 tablet (75 mg total) by mouth daily. 30 tablet 3  . diltiazem (CARDIZEM CD) 360 MG 24 hr capsule Take 1 capsule (360 mg total) by mouth daily. 30 capsule 3  . DULoxetine (CYMBALTA) 60 MG capsule Take 1 capsule (60 mg total) by mouth daily. For mood control 30 capsule 3  . gabapentin (NEURONTIN) 300 MG capsule Take 2 capsules (600 mg total) by mouth 2 (two) times daily. 120 capsule 3  . glipiZIDE  (GLUCOTROL) 5 MG tablet Take 1 tablet (5 mg total) by mouth 2 (two) times daily before a meal. 60 tablet 3  . hydrALAZINE (APRESOLINE) 50 MG tablet Take 1 tablet (50 mg total) by mouth 2 (two) times daily. For High blood pressure 90 tablet 3  . hydrochlorothiazide (MICROZIDE) 12.5 MG capsule Take 1 capsule (12.5 mg total) by mouth daily. 30 capsule 3  . insulin glargine (LANTUS) 100 UNIT/ML injection Inject 0.2 mLs (20 Units total) into the skin at bedtime. 30 mL 3  . metFORMIN (GLUCOPHAGE) 1000 MG tablet Take 1 tablet (1,000 mg total) by mouth 2 (two) times daily with a meal. 60 tablet 3  . potassium chloride SA (KLOR-CON M20) 20 MEQ tablet Take 1 tablet (20 mEq total) by mouth daily. 30 tablet 3  . ranitidine (ZANTAC) 150 MG tablet Take 1 tablet (150 mg total) by mouth 2 (two) times daily. 60 tablet 3  . rosuvastatin (CRESTOR) 40 MG tablet Take 1 tablet (40 mg total) by mouth daily. 30 tablet 3  . traZODone (DESYREL) 50 MG tablet Take 1 tablet (50 mg total) by mouth at bedtime as needed for sleep. 30 tablet 3   No facility-administered medications prior to visit.     ROS Review of Systems  Constitutional: Negative for activity change, appetite change and fatigue.  HENT: Positive for dental problem. Negative for congestion, sinus pressure and sore throat.   Eyes: Negative for visual disturbance.  Respiratory: Negative for cough, chest tightness, shortness of breath and wheezing.   Cardiovascular: Negative for chest pain and palpitations.  Gastrointestinal: Negative for abdominal distention, abdominal pain and constipation.  Endocrine: Negative for polydipsia.  Genitourinary: Negative for dysuria and frequency.  Musculoskeletal: Negative for arthralgias and back pain.  Skin: Negative for rash.  Neurological: Negative for tremors, light-headedness and numbness.  Hematological: Does not bruise/bleed easily.  Psychiatric/Behavioral: Negative for agitation and behavioral problems.     Objective:  BP 102/60   Pulse 91   Temp 98.2 F (36.8 C) (Oral)   Ht '5\' 5"'  (1.651 m)   Wt 155 lb 6.4 oz (70.5 kg)   SpO2 98%   BMI 25.86 kg/m   BP/Weight 03/27/2018 01/08/2018 10/14/1476  Systolic BP 295 621 308  Diastolic BP 60 90 63  Wt. (Lbs) 155.4 162 161.2  BMI 25.86 26.96 26.83  Some encounter information is confidential and restricted. Go to Review Flowsheets activity to see all data.      Physical Exam  Constitutional: She is oriented to person, place, and time. She appears well-developed and well-nourished.  HENT:  Slight edema and TTP of right maxilla Partial dentures in place.  Cardiovascular: Normal rate, normal heart sounds and intact distal pulses.  No murmur heard. Pulmonary/Chest: Effort normal and breath sounds normal. She has no wheezes. She  has no rales. She exhibits no tenderness.  Abdominal: Soft. Bowel sounds are normal. She exhibits no distension and no mass. There is no tenderness.  Musculoskeletal: Normal range of motion.  Neurological: She is alert and oriented to person, place, and time.  Skin: Skin is warm and dry.  Psychiatric: She has a normal mood and affect.    Lab Results  Component Value Date   HGBA1C 8.9 (A) 03/27/2018    Assessment & Plan:   1. Type 2 diabetes mellitus with 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 gangrene, with long-term current use of insulin (HCC) Uncontrolled with A1c of 8.9 Increased dose of Glipizide Counseled on Diabetic diet, my plate method, 233 minutes of moderate intensity exercise/week Keep blood sugar logs with fasting goals of 80-120 mg/dl, random of less than 180 and in the event of sugars less than 60 mg/dl or greater than 400 mg/dl please notify the clinic ASAP. It is recommended that you undergo annual eye exams and annual foot exams. Pneumonia vaccine is recommended. - POCT glucose (manual entry) - POCT glycosylated hemoglobin (Hb A1C) - gabapentin (NEURONTIN) 300 MG capsule; Take 2 capsules  (600 mg total) by mouth 2 (two) times daily.  Dispense: 120 capsule; Refill: 3 - glipiZIDE (GLUCOTROL) 10 MG tablet; Take 1 tablet (10 mg total) by mouth 2 (two) times daily before a meal.  Dispense: 60 tablet; Refill: 3 - insulin glargine (LANTUS) 100 UNIT/ML injection; Inject 0.2 mLs (20 Units total) into the skin at bedtime.  Dispense: 30 mL; Refill: 3 - metFORMIN (GLUCOPHAGE) 1000 MG tablet; Take 1 tablet (1,000 mg total) by mouth 2 (two) times daily with a meal.  Dispense: 60 tablet; Refill: 3 - Microalbumin/Creatinine Ratio, Urine  2. Screening for breast cancer - MM Digital Screening; Future  3. Chronic dental pain Placed on Amoxicillin while awaiting dental appointment  4. Mild intermittent asthma without complication Stable - albuterol (PROVENTIL HFA;VENTOLIN HFA) 108 (90 Base) MCG/ACT inhaler; Inhale 2 puffs into the lungs every 6 (six) hours as needed for wheezing or shortness of breath. Shortness of breath  Dispense: 1 Inhaler; Refill: 3  5. History of stroke Stable Risk factor modification - clopidogrel (PLAVIX) 75 MG tablet; Take 1 tablet (75 mg total) by mouth daily.  Dispense: 30 tablet; Refill: 3  6. Chronic diastolic CHF (congestive heart failure) (HCC) EF 60-65% Euvolemic - diltiazem (CARDIZEM CD) 360 MG 24 hr capsule; Take 1 capsule (360 mg total) by mouth daily.  Dispense: 30 capsule; Refill: 3 - potassium chloride SA (KLOR-CON M20) 20 MEQ tablet; Take 1 tablet (20 mEq total) by mouth daily.  Dispense: 30 tablet; Refill: 3  7. Recurrent major depressive disorder, remission status unspecified (HCC) Stable - DULoxetine (CYMBALTA) 60 MG capsule; Take 1 capsule (60 mg total) by mouth daily. For mood control  Dispense: 30 capsule; Refill: 3  8. Essential hypertension Controlled Counseled on blood pressure goal of less than 130/80, low-sodium, DASH diet, medication compliance, 150 minutes of moderate intensity exercise per week. Discussed medication compliance,  adverse effects. - hydrALAZINE (APRESOLINE) 50 MG tablet; Take 1 tablet (50 mg total) by mouth 2 (two) times daily. For High blood pressure  Dispense: 60 tablet; Refill: 3 - hydrochlorothiazide (MICROZIDE) 12.5 MG capsule; Take 1 capsule (12.5 mg total) by mouth daily.  Dispense: 30 capsule; Refill: 3  9. Gastroesophageal reflux disease without esophagitis Controlled - ranitidine (ZANTAC) 150 MG tablet; Take 1 tablet (150 mg total) by mouth 2 (two) times daily.  Dispense: 60 tablet; Refill: 3  10. Pure hypercholesterolemia Stable Low-cholesterol diet - rosuvastatin (CRESTOR) 40 MG tablet; Take 1 tablet (40 mg total) by mouth daily.  Dispense: 30 tablet; Refill: 3  Healthcare maintenance-advised of the need to undergo colonoscopy for colon cancer screening.  Previously referred for mammogram and I will refer again.  Meds ordered this encounter  Medications  . amoxicillin (AMOXIL) 500 MG capsule    Sig: Take 1 capsule (500 mg total) by mouth 3 (three) times daily.    Dispense:  30 capsule    Refill:  0  . albuterol (PROVENTIL HFA;VENTOLIN HFA) 108 (90 Base) MCG/ACT inhaler    Sig: Inhale 2 puffs into the lungs every 6 (six) hours as needed for wheezing or shortness of breath. Shortness of breath    Dispense:  1 Inhaler    Refill:  3  . clopidogrel (PLAVIX) 75 MG tablet    Sig: Take 1 tablet (75 mg total) by mouth daily.    Dispense:  30 tablet    Refill:  3  . diltiazem (CARDIZEM CD) 360 MG 24 hr capsule    Sig: Take 1 capsule (360 mg total) by mouth daily.    Dispense:  30 capsule    Refill:  3  . DULoxetine (CYMBALTA) 60 MG capsule    Sig: Take 1 capsule (60 mg total) by mouth daily. For mood control    Dispense:  30 capsule    Refill:  3  . gabapentin (NEURONTIN) 300 MG capsule    Sig: Take 2 capsules (600 mg total) by mouth 2 (two) times daily.    Dispense:  120 capsule    Refill:  3    Please consider 90 day supplies to promote better adherence  . glipiZIDE (GLUCOTROL) 10  MG tablet    Sig: Take 1 tablet (10 mg total) by mouth 2 (two) times daily before a meal.    Dispense:  60 tablet    Refill:  3    Discontinue previous dose  . hydrALAZINE (APRESOLINE) 50 MG tablet    Sig: Take 1 tablet (50 mg total) by mouth 2 (two) times daily. For High blood pressure    Dispense:  60 tablet    Refill:  3  . hydrochlorothiazide (MICROZIDE) 12.5 MG capsule    Sig: Take 1 capsule (12.5 mg total) by mouth daily.    Dispense:  30 capsule    Refill:  3  . insulin glargine (LANTUS) 100 UNIT/ML injection    Sig: Inject 0.2 mLs (20 Units total) into the skin at bedtime.    Dispense:  30 mL    Refill:  3  . metFORMIN (GLUCOPHAGE) 1000 MG tablet    Sig: Take 1 tablet (1,000 mg total) by mouth 2 (two) times daily with a meal.    Dispense:  60 tablet    Refill:  3  . potassium chloride SA (KLOR-CON M20) 20 MEQ tablet    Sig: Take 1 tablet (20 mEq total) by mouth daily.    Dispense:  30 tablet    Refill:  3    Please consider 90 day supplies to promote better adherence  . ranitidine (ZANTAC) 150 MG tablet    Sig: Take 1 tablet (150 mg total) by mouth 2 (two) times daily.    Dispense:  60 tablet    Refill:  3  . rosuvastatin (CRESTOR) 40 MG tablet    Sig: Take 1 tablet (40 mg total) by mouth daily.    Dispense:  30 tablet  Refill:  3  . traZODone (DESYREL) 50 MG tablet    Sig: Take 1 tablet (50 mg total) by mouth at bedtime as needed for sleep.    Dispense:  30 tablet    Refill:  3    Follow-up: Return in about 3 months (around 06/27/2018) for Follow-up of chronic medical conditions.   Charlott Rakes MD

## 2018-03-28 ENCOUNTER — Encounter: Payer: Self-pay | Admitting: Family Medicine

## 2018-03-28 LAB — MICROALBUMIN / CREATININE URINE RATIO
Creatinine, Urine: 245.5 mg/dL
Microalb/Creat Ratio: 9.2 mg/g creat (ref 0.0–30.0)
Microalbumin, Urine: 22.6 ug/mL

## 2018-04-06 ENCOUNTER — Ambulatory Visit (INDEPENDENT_AMBULATORY_CARE_PROVIDER_SITE_OTHER): Payer: BLUE CROSS/BLUE SHIELD | Admitting: *Deleted

## 2018-04-06 DIAGNOSIS — I63011 Cerebral infarction due to thrombosis of right vertebral artery: Secondary | ICD-10-CM

## 2018-04-09 NOTE — Progress Notes (Signed)
[facility] Summary Report / Loop Recorder 

## 2018-04-10 LAB — CUP PACEART REMOTE DEVICE CHECK
Implantable Pulse Generator Implant Date: 20161111
MDC IDC SESS DTM: 20190609220626

## 2018-04-16 ENCOUNTER — Telehealth: Payer: Self-pay | Admitting: Neurology

## 2018-04-16 NOTE — Telephone Encounter (Signed)
Pt states she is having some dental issues but no dentist will touch her teeth until she gets approval from Dr Jaynee Eagles to be off of her blood thinners for at least 5 days.  Pt is asking that a fax be sent to her dentist Dr Wyline Beady with approval fax [phone] ph [phone].  Pt has agreed to a f/u visit

## 2018-04-16 NOTE — Telephone Encounter (Signed)
Per Dr. Jaynee Eagles, pt needs to be seen first and can see [name] NP. Called pt and informed her of this and she verbalized understanding. I canceled her 7/31 appt and r/s her for this Thurs 7/25 @ 8:15 AM arrival 7:45. Pt given tomorrow as option too but she preferred Thurs.

## 2018-04-18 ENCOUNTER — Ambulatory Visit: Payer: Self-pay | Admitting: Family Medicine

## 2018-04-18 NOTE — Progress Notes (Addendum)
[facility]    Provider:  Dr Jaynee Eagles Referring Provider: Charlott Rakes, MD Primary Care Physician:  Charlott Rakes, MD  CC:  stroke  Interval history 04/18/18: Patient is being seen today for follow-up.  Since previous follow-up, patient did experience a TIA on 12/05/2017 after an episode of aphasia.  Patient did DAPT for 3 weeks and then continue Plavix alone.Was also recommended to follow-up with vascular surgery outpatient due to MRA showing intracranial stenosis including the right V4, right M3, right M1 as well as left subclavian artery stenosis.  Loop recorder has not shown atrial fibrillation thus far.  Patient is being seen today for follow-up appointment to receive clearance prior to dental procedure.  She states she has been good without additional TIA episodes.  She did complete 3 weeks of DAPT with aspirin and Plavix and currently on Plavix alone with mild bruising but no bleeding.  Continues to take Crestor without side effects of myalgias.  States she continues to have a difficult time controlling her diabetes as her glucose levels are "all over the place".  PCP is managing these for her.  Blood pressure mildly elevated at today's visit 150/82 but patient has not taken her morning medications yet and as she does monitor this at home, she states is typically lower.  Patient states she has been clean of recreational drug use over one year now as she was at halfway house/sober living house where she is constantly monitored for any type of drug/alcohol use.  Patient needs to undergo dental procedure with a need to remove 3 teeth and they have been very painful radiating up into her ear and making it difficult to chew.  Loop recorder has not shown atrial fibrillation thus far.  Denies new or worsening stroke/TIA symptoms.   Interval history 01/11/2017: She is still on Plavix and ASA. She is still smoking. She had a crack cocaine relapse recently. She moved back here, she has  a job now. She had a recent visit to the ED for headache in the setting of high blood pressure after she was taken off of HTN meds, she feels better with blood pressure management with Dr. Rayann Heman after being placed on lasix. The headaches are resolved.    HPI:  Annalucia Laino is a 62 y.o. 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 Ms. Jozey Janco is a 62 y.o. female with history of hypertension, hyperlipidemia, asthma, diabetes mellitus, presented initially to [facility] ED with 5 day history of diplopia, moderate to severe right-sided headache and gait instability. She did not receive IV t-PA due to delay in arrival.  Interval History 09/16/2015: She is getting better. Symptoms are improving. Headache is mostly gone but she still is having some pressure maybe due to sinus pain. Double vision resolved, walking is better, no weakness. No more wavy lines. She has cut back on smoking and is trying to quit. No altered mentation or seizure activity. She goes to the cardiologist tomorrow to see about the loop implantor.She is on crestor. Taking ASA 325 every day. Blood pressure is a little high today, she endorses compliance with her medications. I offered to increase her meds, she says that she is seeing Dr. Joylene Grapes tomorrow, she is going to speak with him about that as he manages her blood pressure.   Reviewed notes, labs and imaging from outside physicians, which showed:  IMAGING  Ct Head Wo Contrast 08/05/2015 1. Area of cortical low-attenuation  the right parietal region, suspicious for evolving cortically based infarct. This could be confirmed with MRI of the brain with and without IV gadolinium. 2. Very mild chronic microvascular ischemic changes in the cerebral white matter, as above. 3. Paranasal sinus disease, as above, including a small air-fluid level in the right maxillary sinus, which could indicate acute sinusitis.   Ct  Angio Neck W/cm &/or Wo/cm 08/06/2015 No significant carotid or vertebral artery stenosis in the neck.   Mr [name] Head Wo Contrast 08/05/2015 1. Segmental occlusion or near complete occlusion of the proximal right M2 inferior division. 2. Mild right M1 stenosis. 3. Moderate distal right vertebral artery stenosis. 4. 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   Mr Brain Wo Contrast 08/05/2015 There is mild right parieto-occipital cortical cytotoxic edema related to the acute infarct. No intracranial hemorrhage, mass, midline shift, or extra-axial fluid collection is seen. Small foci of T2 hyperintensity in the subcortical and deep cerebral white matter bilaterally and in the pons are nonspecific but compatible with mild chronic small vessel ischemic disease. Orbits are unremarkable. There is mild right maxillary sinus mucosal thickening with small volume fluid/secretions. Polypoid mucosal thickening is noted in the left sphenoid and left maxillary sinuses, and there is mild bilateral ethmoid air cell mucosal thickening. Trace bilateral mastoid effusions are noted. Major intracranial vascular flow voids are preserved.   2D Echocardiogram  - Left ventricle: The cavity size was normal. Wall thickness wasnormal. Systolic function was normal. The estimated ejectionfraction was in the range of 55% to 60%. Wall motion was normal;there were no regional wall motion abnormalities. Dopplerparameters are consistent with abnormal left ventricularrelaxation (grade 1 diastolic dysfunction). - Left atrium: The atrium was mildly dilated. - Atrial septum: No defect or patent foramen ovale was identified.  EEG - This is an abnormal electroencephalogram due to sharp activity noted in the left temporal region. This is suggestive of a focal disturbance with epileptogenic potential.  TEE No obvious source of embolism. Normal LV size and function Normal RV size and function Normal RA Normal  LA and LA appendage Normal TV with trivial TR Normal PV Normal MV with trivial MR Normal trileaflet AV Normal interatrial septum with no evidence of shunt by colorflow dopper or agitated saline contrast Normal thoracic and ascending aorta.  Review of Systems: Patient complains of symptoms per HPI as well as the following symptoms: no CP, no SOB. Pertinent negatives per HPI. All others negative.   Social History   Socioeconomic History  . Marital status: Single    Spouse name: Not on file  . Number of children: 5  . Years of education: 64  . Highest education level: Not on file  Occupational History  . Occupation: CNA- [employer]  . Financial resource strain: Not on file  . Food insecurity:    Worry: Not on file    Inability: Not on file  . Transportation needs:    Medical: Not on file    Non-medical: Not on file  Tobacco Use  . Smoking status: Current Every Day Smoker    Packs/day: 0.50    Types: Cigarettes  . Smokeless tobacco: Never Used  . Tobacco comment: 10 cigarettes per day  Substance and Sexual Activity  . Alcohol use: Yes    Alcohol/week: 3.6 oz    Types: 6 Cans of beer per week    Comment: occasionally  . Drug use: No    Types: "Crack" cocaine, Cocaine    Comment:  last used 3 months ago.  Marland Kitchen Sexual activity: Never    Comment: recovering cocaine addict x6 months  Lifestyle  . Physical activity:    Days per week: Not on file    Minutes per session: Not on file  . Stress: Not on file  Relationships  . Social connections:    Talks on phone: Not on file    Gets together: Not on file    Attends religious service: Not on file    Active member of club or organization: Not on file    Attends meetings of clubs or organizations: Not on file    Relationship status: Not on file  . Intimate partner violence:    Fear of current or ex partner: Not on file    Emotionally abused: Not on file    Physically abused: Not on file    Forced  sexual activity: Not on file  Other Topics Concern  . Not on file  Social History Narrative   Lives with daughter      Caffeine use: Drinks soda (2- 20 oz soda per day)    Family History  Problem Relation Age of Onset  . Diabetes Mother   . Hyperlipidemia Mother   . Hypertension Mother   . Breast cancer Mother   . Rheum arthritis Father   . Asthma Father   . Stroke Father   . Thyroid disease Father   . Thyroid disease Brother   . Colon cancer Brother        in his 36s  . Breast cancer Sister     Past Medical History:  Diagnosis Date  . Asthma   . Cancer ([place])    breast  . Chronic congestive heart failure with left ventricular diastolic dysfunction ([place])   . Diabetes mellitus   . Hyperlipidemia   . Hypertension   . Stroke [facility])     Past Surgical History:  Procedure Laterality Date  . BREAST LUMPECTOMY    . EP IMPLANTABLE DEVICE N/A 08/07/2015   Procedure: Loop Recorder Insertion;  Surgeon: Thompson Grayer, MD;  Location: [facility];  Service: Cardiovascular;  Laterality: N/A;  . TEE WITHOUT CARDIOVERSION N/A 08/07/2015   Procedure: TRANSESOPHAGEAL ECHOCARDIOGRAM (TEE);  Surgeon: Sueanne Margarita, MD;  Location: [facility] ENDOSCOPY;  Service: Cardiovascular;  Laterality: N/A;  . TONSILLECTOMY      Current Outpatient Medications  Medication Sig Dispense Refill  . acidophilus (RISAQUAD) CAPS capsule Take 1 capsule by mouth daily.    Marland Kitchen albuterol (PROVENTIL HFA;VENTOLIN HFA) 108 (90 Base) MCG/ACT inhaler Inhale 2 puffs into the lungs every 6 (six) hours as needed for wheezing or shortness of breath. Shortness of breath 1 Inhaler 3  . amoxicillin (AMOXIL) 500 MG capsule Take 1 capsule (500 mg total) by mouth 3 (three) times daily. 30 capsule 0  . aspirin EC 81 MG EC tablet Take 1 tablet (81 mg total) by mouth daily. 21 tablet 0  . Blood Glucose Monitoring Suppl (CONTOUR NEXT MONITOR) w/Device KIT 1 strip 3 (three) times daily by Does not apply route. E11.9 1 kit 0  .  Cholecalciferol (VITAMIN D PO) Take 1-2 tablets by mouth daily.    . clopidogrel (PLAVIX) 75 MG tablet Take 1 tablet (75 mg total) by mouth daily. 30 tablet 3  . diltiazem (CARDIZEM CD) 360 MG 24 hr capsule Take 1 capsule (360 mg total) by mouth daily. 30 capsule 3  . DULoxetine (CYMBALTA) 60 MG capsule Take 1 capsule (60 mg total) by  mouth daily. For mood control 30 capsule 3  . gabapentin (NEURONTIN) 300 MG capsule Take 2 capsules (600 mg total) by mouth 2 (two) times daily. 120 capsule 3  . glipiZIDE (GLUCOTROL) 10 MG tablet Take 1 tablet (10 mg total) by mouth 2 (two) times daily before a meal. 60 tablet 3  . glucose blood (CONTOUR NEXT TEST) test strip Use as instructed 3 times daily. E11.9 100 each 12  . hydrALAZINE (APRESOLINE) 50 MG tablet Take 1 tablet (50 mg total) by mouth 2 (two) times daily. For High blood pressure 60 tablet 3  . hydrochlorothiazide (MICROZIDE) 12.5 MG capsule Take 1 capsule (12.5 mg total) by mouth daily. 30 capsule 3  . insulin aspart (NOVOLOG) 100 UNIT/ML injection As per sliding scale 1 vial 12  . insulin glargine (LANTUS) 100 UNIT/ML injection Inject 0.2 mLs (20 Units total) into the skin at bedtime. 30 mL 3  . metFORMIN (GLUCOPHAGE) 1000 MG tablet Take 1 tablet (1,000 mg total) by mouth 2 (two) times daily with a meal. 60 tablet 3  . nicotine (NICODERM CQ) 7 mg/24hr patch Place 1 patch (7 mg total) onto the skin daily. 28 patch 0  . OVER THE COUNTER MEDICATION Take 1-2 tablets by mouth daily. Vitafusion    . potassium chloride SA (KLOR-CON M20) 20 MEQ tablet Take 1 tablet (20 mEq total) by mouth daily. 30 tablet 3  . ranitidine (ZANTAC) 150 MG tablet Take 1 tablet (150 mg total) by mouth 2 (two) times daily. 60 tablet 3  . rosuvastatin (CRESTOR) 40 MG tablet Take 1 tablet (40 mg total) by mouth daily. 30 tablet 3  . traZODone (DESYREL) 50 MG tablet Take 1 tablet (50 mg total) by mouth at bedtime as needed for sleep. 30 tablet 3   No current facility-administered  medications for this visit.     Allergies as of 04/19/2018 - Review Complete 03/28/2018  Allergen Reaction Noted  . Prednisone Hives and Other (See Comments) 08/05/2015  . Benicar [olmesartan] Other (See Comments) 08/06/2015  . Celexa [citalopram] Other (See Comments) 08/06/2015  . Effexor [venlafaxine] Other (See Comments) 08/06/2015  . Shrimp [shellfish allergy] Hives and Swelling 10/08/2012    Vitals: There were no vitals taken for this visit. Last Weight:  Wt Readings from Last 1 Encounters:  03/27/18 155 lb 6.4 oz (70.5 kg)   Last Height:   Ht Readings from Last 1 Encounters:  03/27/18 '5\' 5"'  (1.651 m)     ASSESSMENT/PLAN Ms. Elijah Michaelis is a 62 y.o. female with history of stroke and recent TIA on 11/2017.  Patient currently has loop recorder placed but has not shown atrial fibrillation thus far.  Vascular risk factors include uncontrolled DM, HTN and HLD.  Patient is being seen today for clearance of dental procedure.  Plan:  -Continue Plavix and Crestor for secondary stroke prevention -F/u with PCP regarding HLD, HTN and DM management -Advised to continue to stay active and eat healthy -avoid sugary drinks -Spoke to patient regarding preference of surgical procedure.  Typically, recommend 6 months post stroke/TIA event but due to teeth causing her pain making it difficult for her to eat, patient is cleared for dental procedure as it has been approximately 4 months since TIA.  Advised patient that there is a small but acceptable risk of recurrent stroke/TIA.  Recommend stop Plavix 3 to 5 days prior to procedure and once hemodynamically stable and able to swallow she can restart plavix.  -Consider removal of loop recorder after 07/2018 as  at that point will be a total of 3 years since implanted.  Loop recorder has been causing patient discomfort as it is under her breast on left side and states it takes into her ribs.  Patient states she will contact Dr. Rayann Heman in the beginning  of December to consider having this removed -Maintain strict control of hypertension with blood pressure goal below 130/90, diabetes with hemoglobin A1c goal below 6.5% and cholesterol with LDL cholesterol (bad cholesterol) goal below 70 mg/dL. I also advised the patient to eat a healthy diet with plenty of whole grains, cereals, fruits and vegetables, exercise regularly and maintain ideal body weight.  Follow-up in 6 months or call earlier if needed     Venancio Poisson, [facility]  [facility] [address] [place], [place] [address]  Phone [phone] Fax [phone] Note: This document was prepared with digital dictation and possible smart phrase technology. Any transcriptional errors that result from this process are unintentional.

## 2018-04-19 ENCOUNTER — Encounter: Payer: Self-pay | Admitting: Adult Health

## 2018-04-19 ENCOUNTER — Ambulatory Visit: Payer: BLUE CROSS/BLUE SHIELD | Admitting: Adult Health

## 2018-04-19 VITALS — BP 158/82 | HR 90 | Ht 65.0 in | Wt 154.2 lb

## 2018-04-19 DIAGNOSIS — E785 Hyperlipidemia, unspecified: Secondary | ICD-10-CM | POA: Diagnosis not present

## 2018-04-19 DIAGNOSIS — I10 Essential (primary) hypertension: Secondary | ICD-10-CM

## 2018-04-19 DIAGNOSIS — Z8673 Personal history of transient ischemic attack (TIA), and cerebral infarction without residual deficits: Secondary | ICD-10-CM

## 2018-04-19 DIAGNOSIS — G459 Transient cerebral ischemic attack, unspecified: Secondary | ICD-10-CM

## 2018-04-19 DIAGNOSIS — E1151 Type 2 diabetes mellitus with 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 gangrene: Secondary | ICD-10-CM

## 2018-04-19 DIAGNOSIS — Z794 Long term (current) use of insulin: Secondary | ICD-10-CM

## 2018-04-19 NOTE — Patient Instructions (Signed)
Continue clopidogrel 75 mg daily  and crestor  for secondary stroke prevention  Continue to follow up with PCP regarding cholesterol, blood pressure and diabetes management   Continue to stay active and eat a healthy diet - avoid sodas and try diet sodas  Continue to monitor blood pressure at home  Maintain strict control of hypertension with blood pressure goal below 130/90, diabetes with hemoglobin A1c goal below 6.5% and cholesterol with LDL cholesterol (bad cholesterol) goal below 70 mg/dL. I also advised the patient to eat a healthy diet with plenty of whole grains, cereals, fruits and vegetables, exercise regularly and maintain ideal body weight.  Followup in the future with me in 6 months or call earlier if needed       Thank you for coming to see [place] at [facility]. I hope we have been able to provide you high quality care today.  You may receive a patient satisfaction survey over the next few weeks. We would appreciate your feedback and comments so that we may continue to improve ourselves and the health of our patients.

## 2018-04-23 ENCOUNTER — Encounter: Payer: Self-pay | Admitting: *Deleted

## 2018-04-23 ENCOUNTER — Telehealth: Payer: Self-pay | Admitting: Adult Health

## 2018-04-23 NOTE — Telephone Encounter (Signed)
Yes please sent fax to phone number provided below of clearance for dental procedure. Thank you.

## 2018-04-23 NOTE — Telephone Encounter (Addendum)
Per [employer], pt was already given information for dentist at office visit.

## 2018-04-23 NOTE — Telephone Encounter (Signed)
Pt is requesting the letter to be sent asap since she was seen on 7/25. Thank you

## 2018-04-24 NOTE — Telephone Encounter (Signed)
Letter written fax to Dr.Luke [place] at [phone].

## 2018-04-24 NOTE — Telephone Encounter (Signed)
Fax confirmed for dental work letter clearance.

## 2018-04-25 ENCOUNTER — Ambulatory Visit: Payer: Self-pay | Admitting: Neurology

## 2018-04-26 IMAGING — MR MR MRA HEAD W/O CM
13 of 19 series · 19 of 48 positions shown · IV contrast (multihance)
Comparison: CT HEAD December 05, 2017 and MRA head January 20, 2017 and
MRI/MRA head November 02, 2015

CLINICAL DATA: Follow-up TIA. 4 minutes episode of the aphasia.
History of stroke, hypertension, hyperlipidemia, diabetes and breast
cancer.

EXAM:
MRI HEAD WITHOUT CONTRAST
MRA HEAD WITHOUT CONTRAST
MRA NECK WITHOUT AND WITH CONTRAST
TECHNIQUE: Multiplanar, multiecho pulse sequences of the brain and surrounding
structures were obtained without intravenous contrast. Angiographic
images of the Circle of Willis were obtained using MRA technique
without intravenous contrast. Angiographic images of the neck were
obtained using MRA technique without and with intravenous contrast.
Carotid stenosis measurements (when applicable) are obtained
utilizing NASCET criteria, using the distal internal carotid
diameter as the denominator.
CONTRAST:  15mL MULTIHANCE GADOBENATE DIMEGLUMINE 529 MG/ML IV SOLN

[Series 5: DWI · axial · 3.0mm · 0.94mm/px · 1 of 100 slices shown]
[im 100/100]
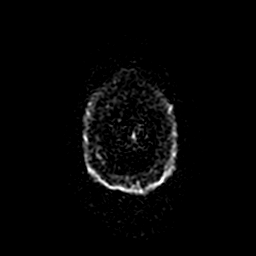

[Series 7: ax (id) 2 · axial · 1.0mm · 0.43mm/px · z∈[-76,-9]mm · 2 of 182 slices shown]
[im 1/182]
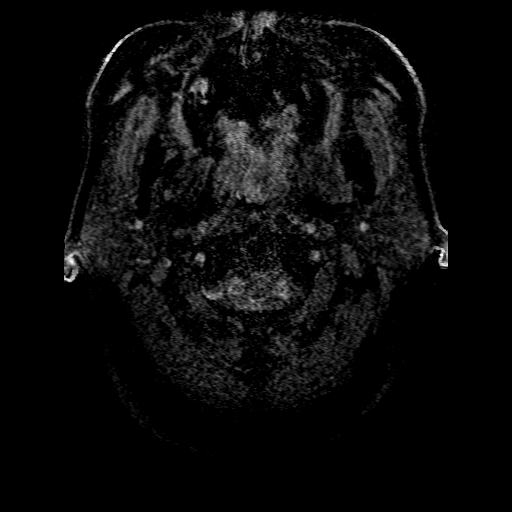
[im 136/182]
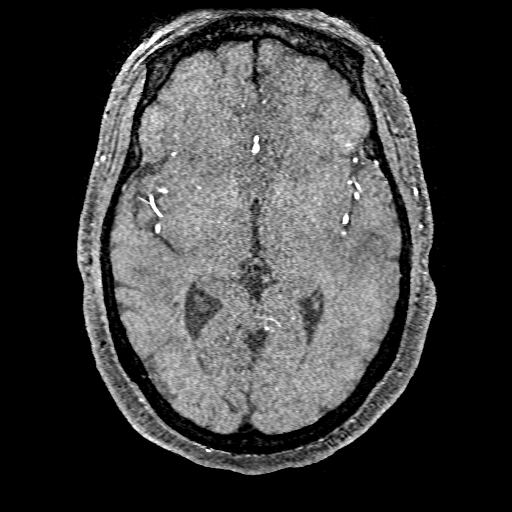

[Series 8: FLAIR · sagittal · 5.0mm · 0.47mm/px · 1 of 23 slices shown]
[im 1/23]
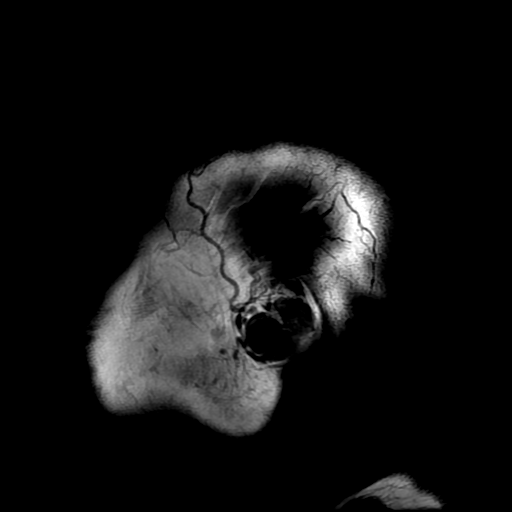

[Series 11: (person_name) · axial · 3.0mm · 0.47mm/px · 1 of 104 slices shown]
[im 1/104]
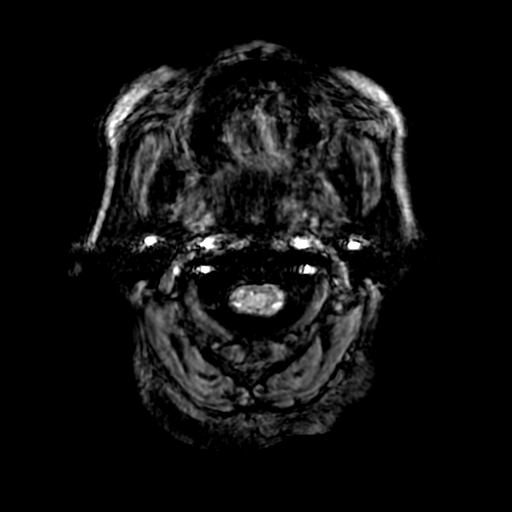

[Series 12: ax 3(person_name) · axial · 3.0mm · 0.94mm/px · 1 of 52 slices shown]
[im 1/52]
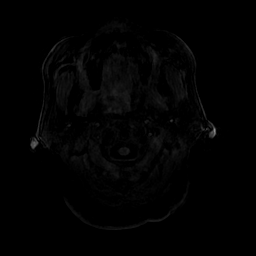

[Series 15: TOF · axial · 2.4mm · 0.47mm/px · z∈[-218,-130]mm · 2 of 152 slices shown]
[im 38/152]
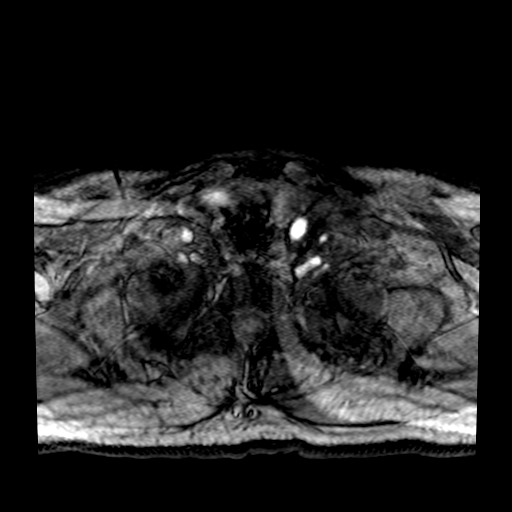
[im 114/152]
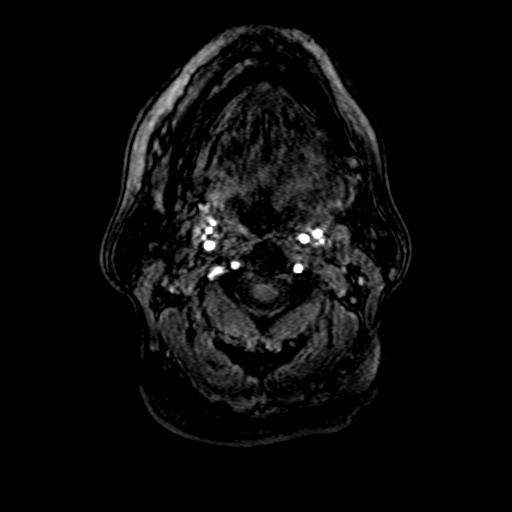

[Series 550: ADC · axial · 3.0mm · 0.94mm/px · 1 of 50 slices shown]
[im 1/50]
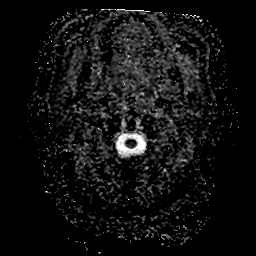

[Series 1100: filt_pha: (person_name) · axial · 3.0mm · 0.47mm/px · z∈[-86,+69]mm · 2 of 104 slices shown]
[im 1/104]
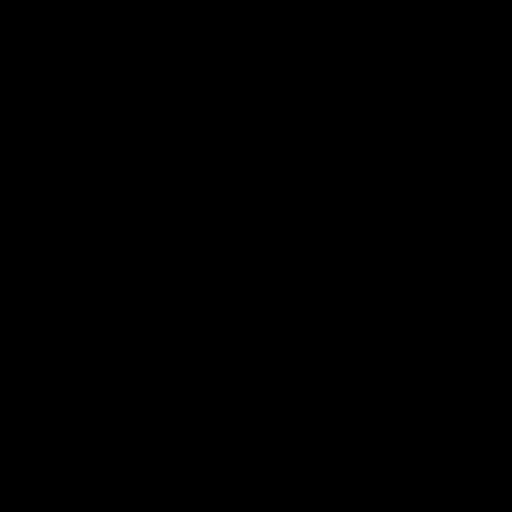
[im 104/104]
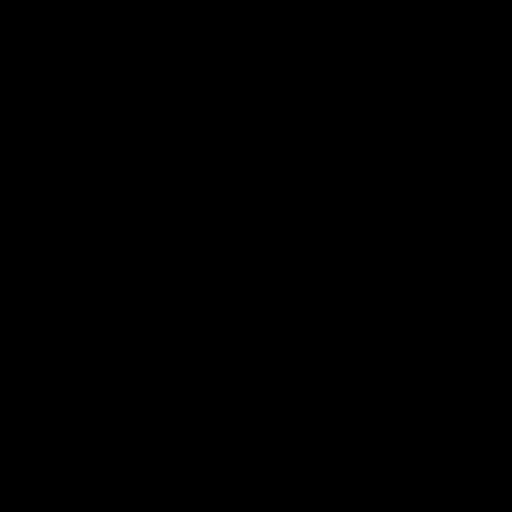

[Series 1600: cor cemra ft · coronal · 1.2mm · 0.59mm/px · 2 of 113 slices shown]
[im 38/113]
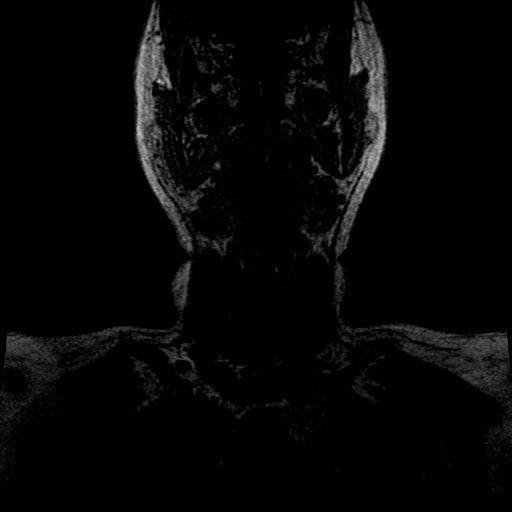
[im 113/113]
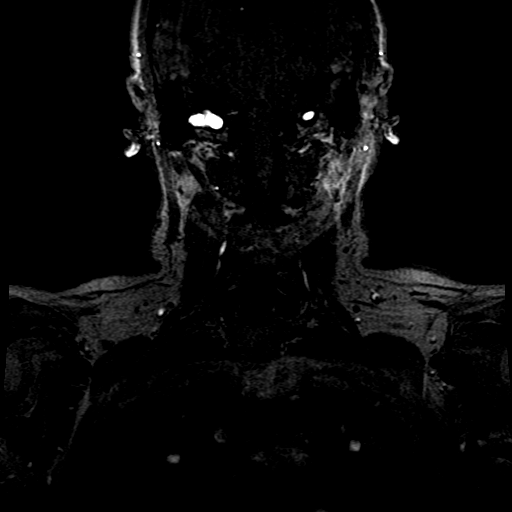

[Series 1601: ph1/cor cemra ft · coronal · 1.2mm · 0.59mm/px · 1 of 112 slices shown]
[im 75/112]
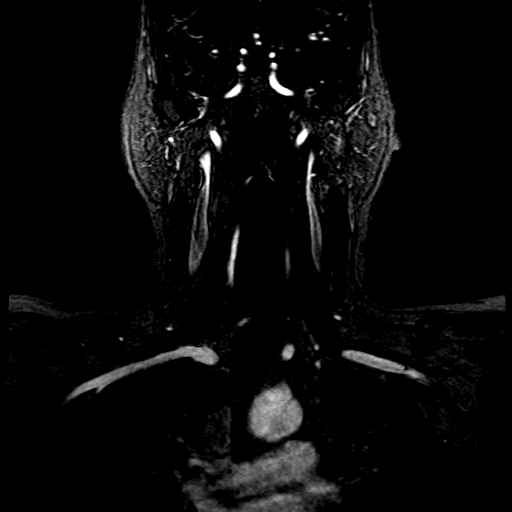

[Series 1602: ph2/cor cemra ft · coronal · 1.2mm · 0.59mm/px · 2 of 112 slices shown]
[im 1/112]
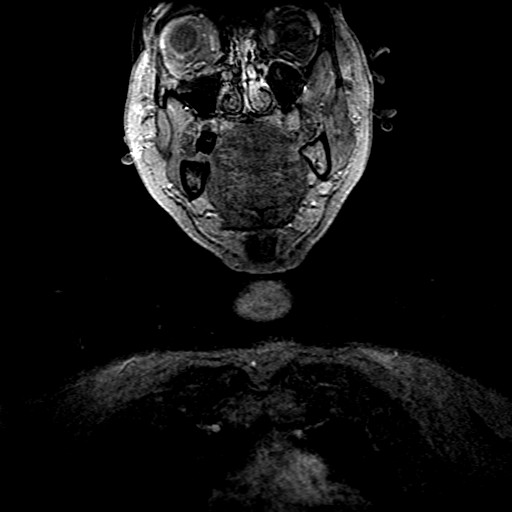
[im 112/112]
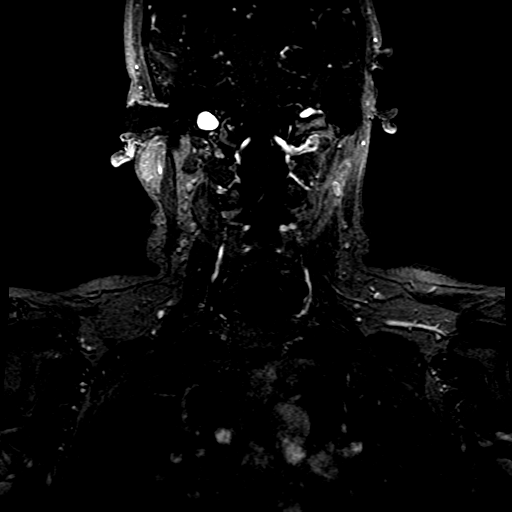

[((id)/(id)/1)-((id)/(id)/1) · coronal · 1.2mm · 0.59mm/px · 1 of 113 slices shown (1 of 2)]
[im 38/113]
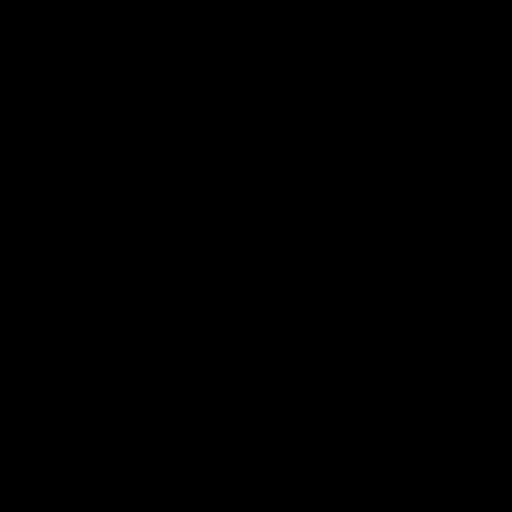

[((id)/(id)/1)-((id)/(id)/1) · coronal · 1.2mm · 0.59mm/px · 2 of 113 slices shown (2 of 2)]
[im 1/113]
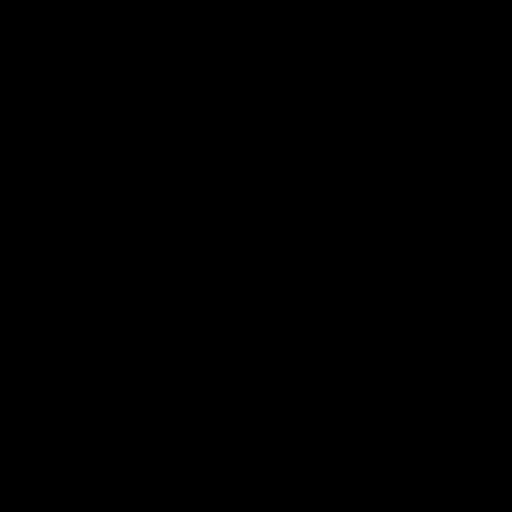
[im 75/113]
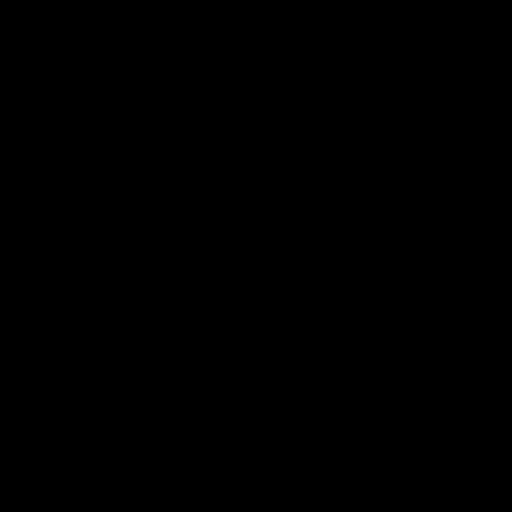

[19 of 48 positions shown; findings below may reference images not displayed]

FINDINGS: MRI HEAD FINDINGS

INTRACRANIAL CONTENTS: No reduced diffusion to suggest acute
ischemia. No susceptibility artifact to suggest hemorrhage.
Discontinuous RIGHT temporoparietal encephalomalacia. Mild ex vacuo
dilatation RIGHT lateral ventricle, no overall parenchymal brain
volume loss for age. Scattered subcentimeter supratentorial and
patchy pontine white matter FLAIR T2 hyperintensities. No suspicious
parenchymal signal, masses, mass effect. No abnormal extra-axial
fluid collections. No extra-axial masses.

VASCULAR: Normal major intracranial vascular flow voids present at
skull base.

SKULL AND UPPER CERVICAL SPINE: Mild partially empty sella. No
suspicious calvarial bone marrow signal. Craniocervical junction
maintained.

SINUSES/ORBITS: LEFT maxillary mucosal retention cysts without
paranasal sinus air-fluid levels. Mild paranasal sinus mucosal
thickening. Mastoid air cells are well aerated.The included ocular
globes and orbital contents are non-suspicious.

OTHER: None.

MRA HEAD FINDINGS

ANTERIOR CIRCULATION: Normal flow related enhancement of the
included cervical, petrous, cavernous and supraclinoid internal
carotid arteries. Stable 2 mm inferiorly directed RIGHT posterior
communicating artery origin aneurysm. Patent anterior communicating
artery. Patent anterior and middle cerebral arteries. Moderate
stenosis proximal M1 segment, similar. Severe stenosis RIGHT M3
origin, unchanged.

No large vessel occlusion, flow limiting stenosis, aneurysm.

POSTERIOR CIRCULATION: LEFT vertebral artery is dominant. Severe
stenosis distal RIGHT V4 segment. Basilar artery is patent, with
normal flow related enhancement of the main branch vessels. Patent
posterior cerebral arteries. Small LEFT posterior communicating
artery present.

No large vessel occlusion, flow limiting stenosis,  aneurysm.

ANATOMIC VARIANTS: None.

Source images and MIP images were reviewed.

MRA NECK FINDINGS

ANTERIOR CIRCULATION: The common carotid arteries are widely patent
bilaterally. The carotid bifurcations are patent bilaterally without
hemodynamically significant stenosis by NASCET criteria. No flow
limiting stenosis or luminal irregularity carotid arteries. Severe
stenosis LEFT subclavian artery distal to the LEFT vertebral artery
takeoff.

POSTERIOR CIRCULATION: Bilateral vertebral arteries are patent to
the vertebrobasilar junction. No flow limiting stenosis or luminal
irregularity.

Source images and MIP images were reviewed.
IMPRESSION: MRI HEAD:

1. No acute intracranial process.
2. Multifocal old small RIGHT MCA territory infarcts.
3. Mild chronic small vessel ischemic disease.

MRA HEAD:

1. No emergent large vessel occlusion.
2. Stable severe stenosis distal RIGHT V4 segment and RIGHT M3
origin. Moderate stenosis RIGHT M1 segment.
3. Stable 2 mm RIGHT PCOM origin aneurysm.

MRA NECK:

1. No hemodynamically significant stenosis carotid artery's.
2. Severe stenosis LEFT subclavian artery distal to vertebral artery
takeoff.

## 2018-05-08 ENCOUNTER — Encounter (HOSPITAL_COMMUNITY): Payer: Self-pay

## 2018-05-08 ENCOUNTER — Emergency Department (HOSPITAL_COMMUNITY)
Admission: EM | Admit: 2018-05-08 | Discharge: 2018-05-08 | Disposition: A | Discharge status: Home / Self Care | Payer: BLUE CROSS/BLUE SHIELD | Attending: Emergency Medicine | Admitting: Emergency Medicine

## 2018-05-08 ENCOUNTER — Other Ambulatory Visit: Payer: Self-pay

## 2018-05-08 DIAGNOSIS — Z79899 Other long term (current) drug therapy: Secondary | ICD-10-CM | POA: Insufficient documentation

## 2018-05-08 DIAGNOSIS — I5032 Chronic diastolic (congestive) heart failure: Secondary | ICD-10-CM | POA: Diagnosis not present

## 2018-05-08 DIAGNOSIS — Z794 Long term (current) use of insulin: Secondary | ICD-10-CM | POA: Diagnosis not present

## 2018-05-08 DIAGNOSIS — F17210 Nicotine dependence, cigarettes, uncomplicated: Secondary | ICD-10-CM | POA: Insufficient documentation

## 2018-05-08 DIAGNOSIS — R739 Hyperglycemia, unspecified: Secondary | ICD-10-CM

## 2018-05-08 DIAGNOSIS — Z853 Personal history of malignant neoplasm of breast: Secondary | ICD-10-CM | POA: Insufficient documentation

## 2018-05-08 DIAGNOSIS — Z7902 Long term (current) use of antithrombotics/antiplatelets: Secondary | ICD-10-CM | POA: Insufficient documentation

## 2018-05-08 DIAGNOSIS — L03317 Cellulitis of buttock: Secondary | ICD-10-CM | POA: Insufficient documentation

## 2018-05-08 DIAGNOSIS — E1165 Type 2 diabetes mellitus with hyperglycemia: Secondary | ICD-10-CM | POA: Insufficient documentation

## 2018-05-08 DIAGNOSIS — J45909 Unspecified asthma, uncomplicated: Secondary | ICD-10-CM | POA: Diagnosis not present

## 2018-05-08 DIAGNOSIS — Z8673 Personal history of transient ischemic attack (TIA), and cerebral infarction without residual deficits: Secondary | ICD-10-CM | POA: Insufficient documentation

## 2018-05-08 DIAGNOSIS — R21 Rash and other nonspecific skin eruption: Secondary | ICD-10-CM | POA: Diagnosis present

## 2018-05-08 LAB — CBG MONITORING, ED: GLUCOSE-CAPILLARY: 298 mg/dL — AB (ref 70–99)

## 2018-05-08 MED ORDER — CLINDAMYCIN HCL 300 MG PO CAPS
300.0000 mg | ORAL_CAPSULE | Freq: Once | ORAL | Status: AC
  Administered 2018-05-08: 300 mg via ORAL
  Filled 2018-05-08: qty 1

## 2018-05-08 MED ORDER — CLINDAMYCIN HCL 300 MG PO CAPS
300.0000 mg | ORAL_CAPSULE | Freq: Three times a day (TID) | ORAL | 0 refills | Status: DC
Start: 2018-05-08 — End: 2018-09-22

## 2018-05-08 NOTE — ED Triage Notes (Signed)
Patient reports that she sat on a chair that had been cleaned with something and the patient reports that she had a rash on her buttocks and bilateral inner thighs x 3 days.
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
## 2018-05-08 NOTE — Discharge Instructions (Signed)
Take clindamycin three times daily for a week for skin infection.   Take tylenol, motrin for pain.   Your blood sugar is elevated likely from skin infection. Take your medicines for diabetes as prescribed.   See your doctor.  Return to ER if you have worse rash, fever, drainage.

## 2018-05-09 ENCOUNTER — Ambulatory Visit (INDEPENDENT_AMBULATORY_CARE_PROVIDER_SITE_OTHER): Payer: BLUE CROSS/BLUE SHIELD | Admitting: *Deleted

## 2018-05-09 DIAGNOSIS — I63011 Cerebral infarction due to thrombosis of right vertebral artery: Secondary | ICD-10-CM | POA: Diagnosis not present

## 2018-05-09 NOTE — Progress Notes (Signed)
made any corrections needed to history, physical, neuro exam,assessment and agree with plan as stated above.    Lisa Ill, MD  [name], you told patient to restart Plavix asap after surgery correct? Please confirm, thanks

## 2018-05-10 NOTE — Progress Notes (Signed)
[facility] Summary Report / Loop Recorder 

## 2018-05-17 LAB — CUP PACEART REMOTE DEVICE CHECK
Date Time Interrogation Session: 20190712220552
MDC IDC PG IMPLANT DT: 20161111

## 2018-05-29 ENCOUNTER — Telehealth: Payer: Self-pay | Admitting: Cardiology

## 2018-05-29 NOTE — Telephone Encounter (Signed)
Attempted to call pt b/c her home monitor has not updated in at least 14 days. Automated message stating that the number is currently not accepting calls.

## 2018-06-05 ENCOUNTER — Telehealth: Payer: Self-pay

## 2018-06-05 NOTE — Telephone Encounter (Signed)
Spoke w/ pt and requested that she send a manual transmission b/c her home monitor has not updated in at least 14 days.   

## 2018-06-11 ENCOUNTER — Ambulatory Visit (INDEPENDENT_AMBULATORY_CARE_PROVIDER_SITE_OTHER): Payer: Self-pay | Admitting: *Deleted

## 2018-06-11 DIAGNOSIS — I63011 Cerebral infarction due to thrombosis of right vertebral artery: Secondary | ICD-10-CM

## 2018-06-12 NOTE — Progress Notes (Signed)
[facility] Summary Report / Loop Recorder 

## 2018-06-14 LAB — CUP PACEART REMOTE DEVICE CHECK
Date Time Interrogation Session: 20190814221027
Implantable Pulse Generator Implant Date: 20161111

## 2018-06-24 LAB — CUP PACEART REMOTE DEVICE CHECK
Date Time Interrogation Session: 20190917004001
MDC IDC PG IMPLANT DT: 20161111

## 2018-07-11 ENCOUNTER — Telehealth: Payer: Self-pay | Admitting: Cardiology

## 2018-07-11 NOTE — Telephone Encounter (Signed)
Attempted to call pt b/c her home monitor has not updated in at least 14 days. No answer and unable to leave a message.  

## 2018-07-16 ENCOUNTER — Ambulatory Visit (INDEPENDENT_AMBULATORY_CARE_PROVIDER_SITE_OTHER): Payer: Self-pay | Admitting: *Deleted

## 2018-07-16 DIAGNOSIS — I63011 Cerebral infarction due to thrombosis of right vertebral artery: Secondary | ICD-10-CM

## 2018-07-16 NOTE — Progress Notes (Signed)
[facility] Summary Report / Loop Recorder 

## 2018-07-23 ENCOUNTER — Encounter: Payer: Self-pay | Admitting: Cardiology

## 2018-08-03 LAB — CUP PACEART REMOTE DEVICE CHECK
Date Time Interrogation Session: 20191020013935
Implantable Pulse Generator Implant Date: 20161111

## 2018-08-08 ENCOUNTER — Encounter: Payer: Self-pay | Admitting: Cardiology

## 2018-08-16 ENCOUNTER — Ambulatory Visit: Payer: Self-pay

## 2018-08-16 DIAGNOSIS — I63011 Cerebral infarction due to thrombosis of right vertebral artery: Secondary | ICD-10-CM

## 2018-08-17 NOTE — Progress Notes (Signed)
Not received  

## 2018-08-20 ENCOUNTER — Encounter: Payer: Self-pay | Admitting: Cardiology

## 2018-09-05 ENCOUNTER — Encounter: Payer: Self-pay | Admitting: Cardiology

## 2018-09-05 NOTE — Progress Notes (Signed)
Letter  

## 2018-09-11 ENCOUNTER — Encounter: Payer: Self-pay | Admitting: Cardiology

## 2018-09-12 DIAGNOSIS — Z79899 Other long term (current) drug therapy: Secondary | ICD-10-CM | POA: Insufficient documentation

## 2018-09-12 DIAGNOSIS — F1410 Cocaine abuse, uncomplicated: Secondary | ICD-10-CM | POA: Insufficient documentation

## 2018-09-12 DIAGNOSIS — Z7984 Long term (current) use of oral hypoglycemic drugs: Secondary | ICD-10-CM | POA: Insufficient documentation

## 2018-09-12 DIAGNOSIS — I5032 Chronic diastolic (congestive) heart failure: Secondary | ICD-10-CM | POA: Insufficient documentation

## 2018-09-12 DIAGNOSIS — E119 Type 2 diabetes mellitus without complications: Secondary | ICD-10-CM | POA: Insufficient documentation

## 2018-09-12 DIAGNOSIS — I10 Essential (primary) hypertension: Secondary | ICD-10-CM | POA: Insufficient documentation

## 2018-09-12 DIAGNOSIS — F17210 Nicotine dependence, cigarettes, uncomplicated: Secondary | ICD-10-CM | POA: Insufficient documentation

## 2018-09-12 DIAGNOSIS — R112 Nausea with vomiting, unspecified: Secondary | ICD-10-CM | POA: Insufficient documentation

## 2018-09-12 DIAGNOSIS — Z853 Personal history of malignant neoplasm of breast: Secondary | ICD-10-CM | POA: Insufficient documentation

## 2018-09-12 NOTE — ED Triage Notes (Signed)
Patient states she smoke weed for the first time and it made her nauseated. Patient states she got weed because she could not get hold of her cocaine. She states she was at [employer] a week ago. Patient drunk a 40 oz earlier.

## 2018-09-13 ENCOUNTER — Encounter (HOSPITAL_COMMUNITY): Payer: Self-pay | Admitting: Emergency Medicine

## 2018-09-13 ENCOUNTER — Other Ambulatory Visit: Payer: Self-pay

## 2018-09-13 ENCOUNTER — Emergency Department (HOSPITAL_COMMUNITY)
Admission: EM | Admit: 2018-09-13 | Discharge: 2018-09-13 | Disposition: A | Discharge status: Home / Self Care | Payer: Medicaid Other | Attending: Emergency Medicine | Admitting: Emergency Medicine

## 2018-09-13 DIAGNOSIS — F1410 Cocaine abuse, uncomplicated: Secondary | ICD-10-CM

## 2018-09-13 LAB — RAPID URINE DRUG SCREEN, HOSP PERFORMED
Amphetamines: NOT DETECTED
BARBITURATES: NOT DETECTED
Benzodiazepines: NOT DETECTED
Cocaine: POSITIVE — AB
Opiates: NOT DETECTED
Tetrahydrocannabinol: NOT DETECTED

## 2018-09-13 MED ORDER — ONDANSETRON 4 MG PO TBDP
4.0000 mg | ORAL_TABLET | Freq: Once | ORAL | Status: AC
  Administered 2018-09-13: 4 mg via ORAL
  Filled 2018-09-13: qty 1

## 2018-09-13 NOTE — ED Notes (Signed)
Pt has contacted a ride home.

## 2018-09-13 NOTE — ED Notes (Signed)
Pt able to tolerate PO saltines and ginger ale without difficulty.

## 2018-09-13 NOTE — Discharge Instructions (Addendum)
Drink plenty of fluids, you should feel better as the drugs get out of your system.

## 2018-09-13 NOTE — ED Notes (Signed)
Pt waiting for ride.
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
## 2018-09-18 ENCOUNTER — Other Ambulatory Visit: Payer: Self-pay

## 2018-09-18 ENCOUNTER — Emergency Department (HOSPITAL_COMMUNITY)
Admission: EM | Admit: 2018-09-18 | Discharge: 2018-09-19 | Disposition: A | Discharge status: Other Acute Inpatient Hospital | Payer: Medicaid Other | Attending: Emergency Medicine | Admitting: Emergency Medicine

## 2018-09-18 ENCOUNTER — Encounter (HOSPITAL_COMMUNITY): Payer: Self-pay | Admitting: Emergency Medicine

## 2018-09-18 DIAGNOSIS — R45851 Suicidal ideations: Secondary | ICD-10-CM

## 2018-09-18 DIAGNOSIS — Z853 Personal history of malignant neoplasm of breast: Secondary | ICD-10-CM | POA: Insufficient documentation

## 2018-09-18 DIAGNOSIS — F1410 Cocaine abuse, uncomplicated: Secondary | ICD-10-CM | POA: Insufficient documentation

## 2018-09-18 DIAGNOSIS — Z8673 Personal history of transient ischemic attack (TIA), and cerebral infarction without residual deficits: Secondary | ICD-10-CM | POA: Insufficient documentation

## 2018-09-18 DIAGNOSIS — E119 Type 2 diabetes mellitus without complications: Secondary | ICD-10-CM | POA: Insufficient documentation

## 2018-09-18 DIAGNOSIS — Z794 Long term (current) use of insulin: Secondary | ICD-10-CM | POA: Insufficient documentation

## 2018-09-18 DIAGNOSIS — Z7902 Long term (current) use of antithrombotics/antiplatelets: Secondary | ICD-10-CM | POA: Insufficient documentation

## 2018-09-18 DIAGNOSIS — I110 Hypertensive heart disease with heart failure: Secondary | ICD-10-CM | POA: Insufficient documentation

## 2018-09-18 DIAGNOSIS — F17210 Nicotine dependence, cigarettes, uncomplicated: Secondary | ICD-10-CM | POA: Insufficient documentation

## 2018-09-18 DIAGNOSIS — Z79899 Other long term (current) drug therapy: Secondary | ICD-10-CM | POA: Insufficient documentation

## 2018-09-18 DIAGNOSIS — I5032 Chronic diastolic (congestive) heart failure: Secondary | ICD-10-CM | POA: Insufficient documentation

## 2018-09-18 DIAGNOSIS — F333 Major depressive disorder, recurrent, severe with psychotic symptoms: Secondary | ICD-10-CM | POA: Insufficient documentation

## 2018-09-18 DIAGNOSIS — J45909 Unspecified asthma, uncomplicated: Secondary | ICD-10-CM | POA: Insufficient documentation

## 2018-09-18 DIAGNOSIS — F1910 Other psychoactive substance abuse, uncomplicated: Secondary | ICD-10-CM

## 2018-09-18 LAB — CBC WITH DIFFERENTIAL/PLATELET
Abs Immature Granulocytes: 0.06 10*3/uL (ref 0.00–0.07)
Basophils Absolute: 0.1 10*3/uL (ref 0.0–0.1)
Basophils Relative: 0 %
Eosinophils Absolute: 0.3 10*3/uL (ref 0.0–0.5)
Eosinophils Relative: 3 %
HCT: 40.9 % (ref 36.0–46.0)
Hemoglobin: 12.6 g/dL (ref 12.0–15.0)
IMMATURE GRANULOCYTES: 1 %
LYMPHS PCT: 18 %
Lymphs Abs: 2.1 10*3/uL (ref 0.7–4.0)
MCH: 26.9 pg (ref 26.0–34.0)
MCHC: 30.8 g/dL (ref 30.0–36.0)
MCV: 87.2 fL (ref 80.0–100.0)
Monocytes Absolute: 0.6 10*3/uL (ref 0.1–1.0)
Monocytes Relative: 5 %
Neutro Abs: 8.6 10*3/uL — ABNORMAL HIGH (ref 1.7–7.7)
Neutrophils Relative %: 73 %
Platelets: 402 10*3/uL — ABNORMAL HIGH (ref 150–400)
RBC: 4.69 MIL/uL (ref 3.87–5.11)
RDW: 14.6 % (ref 11.5–15.5)
WBC: 11.8 10*3/uL — ABNORMAL HIGH (ref 4.0–10.5)
nRBC: 0 % (ref 0.0–0.2)

## 2018-09-18 LAB — RAPID URINE DRUG SCREEN, HOSP PERFORMED
Amphetamines: NOT DETECTED
Barbiturates: NOT DETECTED
Benzodiazepines: NOT DETECTED
Cocaine: POSITIVE — AB
OPIATES: NOT DETECTED
Tetrahydrocannabinol: NOT DETECTED

## 2018-09-18 LAB — COMPREHENSIVE METABOLIC PANEL
ALT: 14 U/L (ref 0–44)
AST: 16 U/L (ref 15–41)
Albumin: 4.3 g/dL (ref 3.5–5.0)
Alkaline Phosphatase: 95 U/L (ref 38–126)
Anion gap: 12 (ref 5–15)
BUN: 17 mg/dL (ref 6–20)
CALCIUM: 9.1 mg/dL (ref 8.9–10.3)
CO2: 25 mmol/L (ref 22–32)
Chloride: 102 mmol/L (ref 98–111)
Creatinine, Ser: 1.14 mg/dL — ABNORMAL HIGH (ref 0.44–1.00)
GFR calc Af Amer: >60 mL/min (ref >60)
GFR calc non Af Amer: 52 mL/min — ABNORMAL LOW (ref >60)
Glucose, Bld: 240 mg/dL — ABNORMAL HIGH (ref 70–99)
Potassium: 3.4 mmol/L — ABNORMAL LOW (ref 3.5–5.1)
Sodium: 139 mmol/L (ref 135–145)
Total Bilirubin: 0.8 mg/dL (ref 0.3–1.2)
Total Protein: 7.8 g/dL (ref 6.5–8.1)

## 2018-09-18 LAB — I-STAT BETA HCG BLOOD, ED (MC, WL, AP ONLY): I-stat hCG, quantitative: <5 m[IU]/mL (ref <5)

## 2018-09-18 LAB — ETHANOL: Alcohol, Ethyl (B): <10 mg/dL (ref <10)

## 2018-09-18 MED ORDER — INSULIN ASPART 100 UNIT/ML ~~LOC~~ SOLN: 0.0000 [IU] | Freq: Three times a day (TID) | SUBCUTANEOUS | Status: DC

## 2018-09-18 MED ORDER — DILTIAZEM HCL ER COATED BEADS 180 MG PO CP24
360.0000 mg | ORAL_CAPSULE | Freq: Every day | ORAL | Status: DC
  Administered 2018-09-18: 360 mg via ORAL
  Filled 2018-09-18: qty 2

## 2018-09-18 MED ORDER — CLOPIDOGREL BISULFATE 75 MG PO TABS
75.0000 mg | ORAL_TABLET | Freq: Every day | ORAL | Status: DC
  Administered 2018-09-18: 75 mg via ORAL
  Filled 2018-09-18: qty 1

## 2018-09-18 MED ORDER — TRAZODONE HCL 50 MG PO TABS: 50.0000 mg | ORAL_TABLET | Freq: Every evening | ORAL | Status: DC | PRN

## 2018-09-18 MED ORDER — ALUM & MAG HYDROXIDE-SIMETH 200-200-20 MG/5ML PO SUSP: 30.0000 mL | Freq: Four times a day (QID) | ORAL | Status: DC | PRN

## 2018-09-18 MED ORDER — BENZONATATE 100 MG PO CAPS
100.0000 mg | ORAL_CAPSULE | Freq: Three times a day (TID) | ORAL | Status: DC | PRN
  Administered 2018-09-18: 100 mg via ORAL
  Filled 2018-09-18: qty 1

## 2018-09-18 MED ORDER — NICOTINE 7 MG/24HR TD PT24
7.0000 mg | MEDICATED_PATCH | Freq: Every day | TRANSDERMAL | Status: DC
  Administered 2018-09-18: 7 mg via TRANSDERMAL
  Filled 2018-09-18: qty 1

## 2018-09-18 MED ORDER — HYDROCHLOROTHIAZIDE 12.5 MG PO CAPS
12.5000 mg | ORAL_CAPSULE | Freq: Every day | ORAL | Status: DC
  Administered 2018-09-18: 12.5 mg via ORAL
  Filled 2018-09-18: qty 1

## 2018-09-18 MED ORDER — ZOLPIDEM TARTRATE 5 MG PO TABS: 5.0000 mg | ORAL_TABLET | Freq: Every evening | ORAL | Status: DC | PRN

## 2018-09-18 MED ORDER — ROSUVASTATIN CALCIUM 40 MG PO TABS
40.0000 mg | ORAL_TABLET | Freq: Every day | ORAL | Status: DC
  Administered 2018-09-18: 40 mg via ORAL
  Filled 2018-09-18 (×2): qty 1

## 2018-09-18 MED ORDER — HYDRALAZINE HCL 50 MG PO TABS
50.0000 mg | ORAL_TABLET | Freq: Two times a day (BID) | ORAL | Status: DC
  Administered 2018-09-18: 50 mg via ORAL
  Filled 2018-09-18: qty 1

## 2018-09-18 MED ORDER — POTASSIUM CHLORIDE CRYS ER 20 MEQ PO TBCR
20.0000 meq | EXTENDED_RELEASE_TABLET | Freq: Every day | ORAL | Status: DC
  Administered 2018-09-18: 20 meq via ORAL
  Filled 2018-09-18: qty 1

## 2018-09-18 MED ORDER — HYDROXYZINE HCL 25 MG PO TABS
25.0000 mg | ORAL_TABLET | Freq: Four times a day (QID) | ORAL | Status: DC | PRN
  Administered 2018-09-18: 25 mg via ORAL
  Filled 2018-09-18: qty 1

## 2018-09-18 MED ORDER — ESCITALOPRAM OXALATE 10 MG PO TABS
20.0000 mg | ORAL_TABLET | Freq: Every day | ORAL | Status: DC
  Administered 2018-09-18: 20 mg via ORAL
  Filled 2018-09-18: qty 2

## 2018-09-18 MED ORDER — METFORMIN HCL 500 MG PO TABS: 1000.0000 mg | ORAL_TABLET | Freq: Two times a day (BID) | ORAL | Status: DC

## 2018-09-18 NOTE — ED Notes (Signed)
CBG 211, pt refused Insulin coverage, stating her blood sugar would drop too low.  AC Kim at [facility] notified, pt accepted to [facility].  Admission paperwork faxed.

## 2018-09-18 NOTE — ED Notes (Signed)
Pt presents with SI, plan to overdose on pills, Pt reports she abused Crack Cocaine today and Daughter asked her to move out of her home.  Feeling hopeless, denies HI or AVH.  A&O x 3, monitoring for safety, Q 15 min checks in effect, pt calm & cooperative, sad affect.

## 2018-09-18 NOTE — ED Provider Notes (Signed)
[facility] DEPT Provider Note   CSN: [id number] Arrival date & time: 09/18/18  1556     History   Chief Complaint Chief Complaint  Patient presents with  . Suicidal    HPI Lisa Anderson is a 62 y.o. female.  She presents to the emergency department via police for evaluation of suicidal ideation.  She is voluntary.  She said she is sick and tired of using crack cocaine and wants to end it all.  She said she is hurt herself before.  Her only medical complaint is a cough.  She denies any fevers or chills no chest pain no shortness of breath no vomiting or diarrhea.  She states she has not taken her medicines in 2 weeks.  The police officer who brought her and said she just got kicked out of her house that she lives in with her daughter.  The history is provided by the patient.  Mental Health Problem  Presenting symptoms: depression and suicidal thoughts   Patient accompanied by:  Law enforcement Degree of incapacity (severity):  Severe Onset quality:  Unable to specify Timing:  Intermittent Progression:  Waxing and waning Chronicity:  Recurrent Context: drug abuse, noncompliance and stressful life event   Worsened by:  Drugs Associated symptoms: fatigue and feelings of worthlessness   Associated symptoms: no abdominal pain, no chest pain and no headaches   Risk factors: hx of mental illness and hx of suicide attempts     Past Medical History:  Diagnosis Date  . Asthma   . Cancer ([place])    breast  . Chronic congestive heart failure with left ventricular diastolic dysfunction ([place])   . Diabetes mellitus   . Hyperlipidemia   . Hypertension   . Stroke [facility])     Patient Active Problem List   Diagnosis Date Noted  . Aphasia 12/06/2017  . Hypokalemia 12/06/2017  . Leukocytosis 12/06/2017  . Bipolar I disorder ([place]) 05/23/2017  . MDD (major depressive disorder), recurrent severe, without psychosis ([place]) 05/22/2017  . Major depressive  disorder, recurrent episode, severe ([place]) 05/21/2017  . Cocaine use disorder, severe, dependence ([place]) 05/21/2017  . Suicidal intent 05/20/2017  . Drug overdose 05/19/2017  . Family history of colon cancer 02/23/2017  . Rectal bleeding 02/23/2017  . Gastroesophageal reflux disease without esophagitis 02/23/2017  . Intractable vomiting with nausea 02/23/2017  . Abnormal thyroid blood test 01/02/2017  . Insomnia 01/02/2017  . Anxiety 01/02/2017  . IDDM (insulin dependent diabetes mellitus) ([place])   . Asthma 11/12/2015  . Acute respiratory failure ([place]) 11/11/2015  . TIA (transient ischemic attack) 11/01/2015  . Chronic diastolic CHF (congestive heart failure) ([place]) 11/01/2015  . Acute focal neurological deficit 11/01/2015  . 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 HLD (hyperlipidemia)   . Palpitations   . History of stroke 08/05/2015  . Hypertension 08/05/2015  . Stroke due to embolism of right posterior cerebral artery ([place]) 08/05/2015  . Diabetes mellitus 08/05/2015  . Alcohol dependency ([place]) 08/10/2013  . Cocaine abuse ([place]) 08/08/2013  . Alcohol abuse 08/08/2013  . MDD (major depressive disorder) 08/08/2013    Past Surgical History:  Procedure Laterality Date  . BREAST LUMPECTOMY    . EP IMPLANTABLE DEVICE N/A 08/07/2015   Procedure: Loop Recorder Insertion;  Surgeon: Thompson Grayer, MD;  Location: [facility] LAB;  Service: Cardiovascular;  Laterality: N/A;  . TEE WITHOUT CARDIOVERSION N/A 08/07/2015   Procedure: TRANSESOPHAGEAL ECHOCARDIOGRAM (TEE);  Surgeon: Sueanne Margarita, MD;  Location: [facility] ENDOSCOPY;  Service: Cardiovascular;  Laterality: N/A;  . TONSILLECTOMY       OB History   No obstetric history on file.      Home Medications    Prior to Admission medications   Medication Sig Start Date End Date Taking? Authorizing Provider  acidophilus (RISAQUAD) CAPS capsule Take 1 capsule by mouth daily.    [provider]  albuterol  (PROVENTIL HFA;VENTOLIN HFA) 108 (90 Base) MCG/ACT inhaler Inhale 2 puffs into the lungs every 6 (six) hours as needed for wheezing or shortness of breath. Shortness of breath 03/27/18   Charlott Rakes, MD  amoxicillin (AMOXIL) 500 MG capsule Take 1 capsule (500 mg total) by mouth 3 (three) times daily. 03/27/18   Charlott Rakes, MD  Blood Glucose Monitoring Suppl (CONTOUR NEXT MONITOR) w/Device KIT 1 strip 3 (three) times daily by Does not apply route. E11.9 08/07/17   Tresa Garter, MD  Cholecalciferol (VITAMIN D PO) Take 1-2 tablets by mouth daily.    [provider]  clindamycin (CLEOCIN) 300 MG capsule Take 1 capsule (300 mg total) by mouth 3 (three) times daily. X 7 days 05/08/18   Drenda Freeze, MD  clopidogrel (PLAVIX) 75 MG tablet Take 1 tablet (75 mg total) by mouth daily. 03/27/18   Charlott Rakes, MD  diltiazem (CARDIZEM CD) 360 MG 24 hr capsule Take 1 capsule (360 mg total) by mouth daily. 03/27/18   Charlott Rakes, MD  DULoxetine (CYMBALTA) 60 MG capsule Take 1 capsule (60 mg total) by mouth daily. For mood control 03/27/18   Charlott Rakes, MD  gabapentin (NEURONTIN) 300 MG capsule Take 2 capsules (600 mg total) by mouth 2 (two) times daily. 03/27/18   Charlott Rakes, MD  glipiZIDE (GLUCOTROL) 10 MG tablet Take 1 tablet (10 mg total) by mouth 2 (two) times daily before a meal. 03/27/18   Newlin, Enobong, MD  glucose blood (CONTOUR NEXT TEST) test strip Use as instructed 3 times daily. E11.9 08/07/17   Tresa Garter, MD  hydrALAZINE (APRESOLINE) 50 MG tablet Take 1 tablet (50 mg total) by mouth 2 (two) times daily. For High blood pressure 03/27/18   Charlott Rakes, MD  hydrochlorothiazide (MICROZIDE) 12.5 MG capsule Take 1 capsule (12.5 mg total) by mouth daily. 03/27/18   Charlott Rakes, MD  insulin aspart (NOVOLOG) 100 UNIT/ML injection As per sliding scale 12/20/17   Charlott Rakes, MD  insulin glargine (LANTUS) 100 UNIT/ML injection Inject 0.2 mLs (20 Units total)  into the skin at bedtime. 03/27/18   Charlott Rakes, MD  metFORMIN (GLUCOPHAGE) 1000 MG tablet Take 1 tablet (1,000 mg total) by mouth 2 (two) times daily with a meal. 03/27/18   Newlin, Charlane Ferretti, MD  nicotine (NICODERM CQ) 7 mg/24hr patch Place 1 patch (7 mg total) onto the skin daily. 12/20/17   Charlott Rakes, MD  OVER THE COUNTER MEDICATION Take 1-2 tablets by mouth daily. Vitafusion    [provider]  potassium chloride SA (KLOR-CON M20) 20 MEQ tablet Take 1 tablet (20 mEq total) by mouth daily. 03/27/18   Charlott Rakes, MD  ranitidine (ZANTAC) 150 MG tablet Take 1 tablet (150 mg total) by mouth 2 (two) times daily. 03/27/18   Charlott Rakes, MD  rosuvastatin (CRESTOR) 40 MG tablet Take 1 tablet (40 mg total) by mouth daily. 03/27/18   Charlott Rakes, MD  traZODone (DESYREL) 50 MG tablet Take 1 tablet (50 mg total) by mouth at bedtime as needed for sleep. 03/27/18   Newlin, Charlane Ferretti,  MD    Family History Family History  Problem Relation Age of Onset  . Diabetes Mother   . Hyperlipidemia Mother   . Hypertension Mother   . Breast cancer Mother   . Rheum arthritis Father   . Asthma Father   . Stroke Father   . Thyroid disease Father   . Thyroid disease Brother   . Colon cancer Brother        in his 95s  . Breast cancer Sister     Social History Social History   Tobacco Use  . Smoking status: Current Every Day Smoker    Packs/day: 0.25    Types: Cigarettes  . Smokeless tobacco: Never Used  . Tobacco comment: pt smokes 8 per day  Substance Use Topics  . Alcohol use: Yes    Alcohol/week: 6.0 standard drinks    Types: 6 Cans of beer per week    Comment: occasionally  . Drug use: Not Currently    Types: "Crack" cocaine, Cocaine    Comment: last used a year ago     Allergies   Prednisone; Benicar [olmesartan]; Celexa [citalopram]; Effexor [venlafaxine]; and Shrimp [shellfish allergy]   Review of Systems Review of Systems  Constitutional: Positive for fatigue.  Negative for fever.  HENT: Negative for sore throat.   Eyes: Negative for visual disturbance.  Respiratory: Positive for cough. Negative for shortness of breath.   Cardiovascular: Negative for chest pain.  Gastrointestinal: Negative for abdominal pain.  Genitourinary: Negative for dysuria.  Skin: Negative for rash.  Neurological: Negative for headaches.  Psychiatric/Behavioral: Positive for suicidal ideas.     Physical Exam Updated Vital Signs There were no vitals taken for this visit.  Physical Exam Vitals signs and nursing note reviewed.  Constitutional:      Appearance: She is well-developed.  HENT:     Head: Normocephalic and atraumatic.  Eyes:     Conjunctiva/sclera: Conjunctivae normal.  Neck:     Musculoskeletal: Neck supple.  Cardiovascular:     Rate and Rhythm: Regular rhythm. Tachycardia present.     Pulses: Normal pulses.  Pulmonary:     Effort: Pulmonary effort is normal.     Breath sounds: No stridor. No rhonchi.  Abdominal:     General: Abdomen is flat.     Tenderness: There is no abdominal tenderness. There is no guarding.  Musculoskeletal: Normal range of motion.        General: No tenderness or signs of injury.  Skin:    General: Skin is warm and dry.  Neurological:     General: No focal deficit present.     Mental Status: She is alert and oriented to person, place, and time.     GCS: GCS eye subscore is 4. GCS verbal subscore is 5. GCS motor subscore is 6.     Gait: Gait normal.  Psychiatric:        Mood and Affect: Mood is depressed. Affect is tearful.        Thought Content: Thought content includes suicidal ideation.      ED Treatments / Results  Labs (all labs ordered are listed, but only abnormal results are displayed) Labs Reviewed  COMPREHENSIVE METABOLIC PANEL - Abnormal; Notable for the following components:      Result Value   Potassium 3.4 (*)    Glucose, Bld 240 (*)    Creatinine, Ser 1.14 (*)    GFR calc non Af Amer 52 (*)      All other components within  normal limits  RAPID URINE DRUG SCREEN, HOSP PERFORMED - Abnormal; Notable for the following components:   Cocaine POSITIVE (*)    All other components within normal limits  CBC WITH DIFFERENTIAL/PLATELET - Abnormal; Notable for the following components:   WBC 11.8 (*)    Platelets 402 (*)    Neutro Abs 8.6 (*)    All other components within normal limits  ETHANOL  I-STAT BETA HCG BLOOD, ED ([facility], [facility], [facility] ONLY)    EKG EKG Interpretation  Date/Time:  Tuesday September 18 2018 16:24:14 EST Ventricular Rate:  121 PR Interval:  144 QRS Duration: 82 QT Interval:  346 QTC Calculation: 491 R Axis:   -49 Text Interpretation:  Sinus tachycardia Left anterior fascicular block Abnormal ECG increased rate from prior 3/19 Confirmed by Aletta Edouard [phone]) on 09/18/2018 4:26:29 PM   Radiology No results found.  Procedures Procedures (including critical care time)  Medications Ordered in ED Medications  zolpidem (AMBIEN) tablet 5 mg (has no administration in time range)  alum & mag hydroxide-simeth (MAALOX/MYLANTA) 200-200-20 MG/5ML suspension 30 mL (has no administration in time range)  clopidogrel (PLAVIX) tablet 75 mg (75 mg Oral Given 09/18/18 2031)  diltiazem (CARDIZEM CD) 24 hr capsule 360 mg (360 mg Oral Given 09/18/18 2031)  escitalopram (LEXAPRO) tablet 20 mg (20 mg Oral Given 09/18/18 2037)  hydrALAZINE (APRESOLINE) tablet 50 mg (has no administration in time range)  hydrochlorothiazide (MICROZIDE) capsule 12.5 mg (12.5 mg Oral Given 09/18/18 2031)  metFORMIN (GLUCOPHAGE) tablet 1,000 mg (has no administration in time range)  nicotine (NICODERM CQ - dosed in mg/24 hr) patch 7 mg (7 mg Transdermal Patch Applied 09/18/18 2037)  potassium chloride SA (K-DUR,KLOR-CON) CR tablet 20 mEq (20 mEq Oral Given 09/18/18 2038)  rosuvastatin (CRESTOR) tablet 40 mg (has no administration in time range)  insulin aspart (novoLOG) injection 0-9 Units (has no  administration in time range)  hydrOXYzine (ATARAX/VISTARIL) tablet 25 mg (25 mg Oral Given 09/18/18 2031)  benzonatate (TESSALON) capsule 100 mg (100 mg Oral Given 09/18/18 2031)     Initial Impression / Assessment and Plan / ED Course  I have reviewed the triage vital signs and the nursing notes.  Pertinent labs & imaging results that were available during my care of the patient were reviewed by me and considered in my medical decision making (see chart for details).  Clinical Course as of Sep 19 2207  Tue Sep 18, 2018  2030 Received a call from behavioral health who states the patient meets inpatient criteria and they are out of bed search.  I will call back if a bed is available.   [MB]  2207 Callback from TTS who said the patient's been accepted for admission at behavioral health [facility].  She is anticipated to be transferred over around [place].   [MB]    Clinical Course User Index [MB] Hayden Rasmussen, MD      Final Clinical Impressions(s) / ED Diagnoses   Final diagnoses:  Suicidal ideation  Polysubstance abuse [facility])    ED Discharge Orders    None       Hayden Rasmussen, MD 09/18/18 2208

## 2018-09-18 NOTE — ED Triage Notes (Signed)
Patient was told by her daughter she had to leave the house she has been in with her for 9 years. She told Police that she was suicidal and wanted to come her voluntarily.
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
## 2018-09-18 NOTE — ED Notes (Signed)
Patient reports she doesn't have a reason to live. She says she has tried to stop taking drugs in the past by going into various program, however she always leaves early. When asked, she reports wanting to go into a rehab program again but stay as long as she needs to. She says she wants to kill herself because she can't stop taking drugs. She had planned to kill her self by overdose. Now she would like help to stop.

## 2018-09-18 NOTE — B[facility] Counselor (Signed)
Per Larose Kells, RN, pt has been accepted to [facility] and assigned to room/bed: 504-1. Pt can come at 2230. Accepting physician: Patriciaann Clan, PA. Attending physician: Dr. Jake Samples. Voluntary paperwork to be faxed to [facility]. Dicussed with Dr. Melina Copa and Darryll Capers, [place].   Vertell Novak, MS, [facility], [facility] Triage Specialist [phone]

## 2018-09-19 ENCOUNTER — Inpatient Hospital Stay (HOSPITAL_COMMUNITY)
Admission: AD | Admit: 2018-09-19 | Discharge: 2018-09-22 | DRG: 885 | Disposition: A | Discharge status: Home / Self Care | Payer: Federal, State, Local not specified - Other | Attending: Psychiatry | Admitting: Psychiatry

## 2018-09-19 ENCOUNTER — Encounter (HOSPITAL_COMMUNITY): Payer: Self-pay

## 2018-09-19 DIAGNOSIS — I5032 Chronic diastolic (congestive) heart failure: Secondary | ICD-10-CM | POA: Diagnosis present

## 2018-09-19 DIAGNOSIS — Z825 Family history of asthma and other chronic lower respiratory diseases: Secondary | ICD-10-CM | POA: Diagnosis not present

## 2018-09-19 DIAGNOSIS — E119 Type 2 diabetes mellitus without complications: Secondary | ICD-10-CM | POA: Diagnosis present

## 2018-09-19 DIAGNOSIS — Z888 Allergy status to other drugs, medicaments and biological substances status: Secondary | ICD-10-CM | POA: Diagnosis not present

## 2018-09-19 DIAGNOSIS — F332 Major depressive disorder, recurrent severe without psychotic features: Principal | ICD-10-CM | POA: Diagnosis present

## 2018-09-19 DIAGNOSIS — K219 Gastro-esophageal reflux disease without esophagitis: Secondary | ICD-10-CM | POA: Diagnosis present

## 2018-09-19 DIAGNOSIS — E785 Hyperlipidemia, unspecified: Secondary | ICD-10-CM | POA: Diagnosis present

## 2018-09-19 DIAGNOSIS — Z823 Family history of stroke: Secondary | ICD-10-CM | POA: Diagnosis not present

## 2018-09-19 DIAGNOSIS — Z7902 Long term (current) use of antithrombotics/antiplatelets: Secondary | ICD-10-CM

## 2018-09-19 DIAGNOSIS — I110 Hypertensive heart disease with heart failure: Secondary | ICD-10-CM | POA: Diagnosis present

## 2018-09-19 DIAGNOSIS — F333 Major depressive disorder, recurrent, severe with psychotic symptoms: Secondary | ICD-10-CM | POA: Diagnosis not present

## 2018-09-19 DIAGNOSIS — F1410 Cocaine abuse, uncomplicated: Secondary | ICD-10-CM | POA: Diagnosis present

## 2018-09-19 DIAGNOSIS — Z800 Family history of malignant neoplasm of digestive organs: Secondary | ICD-10-CM

## 2018-09-19 DIAGNOSIS — Z803 Family history of malignant neoplasm of breast: Secondary | ICD-10-CM

## 2018-09-19 DIAGNOSIS — Z8349 Family history of other endocrine, nutritional and metabolic diseases: Secondary | ICD-10-CM

## 2018-09-19 DIAGNOSIS — Z8673 Personal history of transient ischemic attack (TIA), and cerebral infarction without residual deficits: Secondary | ICD-10-CM

## 2018-09-19 DIAGNOSIS — Z590 Homelessness: Secondary | ICD-10-CM

## 2018-09-19 DIAGNOSIS — R45851 Suicidal ideations: Secondary | ICD-10-CM | POA: Diagnosis present

## 2018-09-19 DIAGNOSIS — Z8261 Family history of arthritis: Secondary | ICD-10-CM | POA: Diagnosis not present

## 2018-09-19 DIAGNOSIS — Z91013 Allergy to seafood: Secondary | ICD-10-CM | POA: Diagnosis not present

## 2018-09-19 DIAGNOSIS — F17210 Nicotine dependence, cigarettes, uncomplicated: Secondary | ICD-10-CM | POA: Diagnosis present

## 2018-09-19 DIAGNOSIS — Z8249 Family history of ischemic heart disease and other diseases of the circulatory system: Secondary | ICD-10-CM

## 2018-09-19 DIAGNOSIS — F1010 Alcohol abuse, uncomplicated: Secondary | ICD-10-CM | POA: Diagnosis present

## 2018-09-19 DIAGNOSIS — Z79899 Other long term (current) drug therapy: Secondary | ICD-10-CM

## 2018-09-19 DIAGNOSIS — J45909 Unspecified asthma, uncomplicated: Secondary | ICD-10-CM | POA: Diagnosis present

## 2018-09-19 DIAGNOSIS — Z794 Long term (current) use of insulin: Secondary | ICD-10-CM | POA: Diagnosis not present

## 2018-09-19 DIAGNOSIS — Z833 Family history of diabetes mellitus: Secondary | ICD-10-CM | POA: Diagnosis not present

## 2018-09-19 DIAGNOSIS — G4710 Hypersomnia, unspecified: Secondary | ICD-10-CM | POA: Diagnosis present

## 2018-09-19 LAB — GLUCOSE, CAPILLARY
Glucose-Capillary: 161 mg/dL — ABNORMAL HIGH (ref 70–99)
Glucose-Capillary: 192 mg/dL — ABNORMAL HIGH (ref 70–99)
Glucose-Capillary: 319 mg/dL — ABNORMAL HIGH (ref 70–99)
Glucose-Capillary: 119 mg/dL — ABNORMAL HIGH (ref 70–99)
Glucose-Capillary: 280 mg/dL — ABNORMAL HIGH (ref 70–99)

## 2018-09-19 MED ORDER — CLONIDINE HCL 0.1 MG PO TABS
0.1000 mg | ORAL_TABLET | Freq: Four times a day (QID) | ORAL | Status: AC
  Administered 2018-09-19 – 2018-09-20 (×6): 0.1 mg via ORAL
  Filled 2018-09-19 (×10): qty 1

## 2018-09-19 MED ORDER — LOPERAMIDE HCL 2 MG PO CAPS: 2.0000 mg | ORAL_CAPSULE | ORAL | Status: DC | PRN

## 2018-09-19 MED ORDER — ALUM & MAG HYDROXIDE-SIMETH 200-200-20 MG/5ML PO SUSP: 30.0000 mL | ORAL | Status: DC | PRN

## 2018-09-19 MED ORDER — ACETAMINOPHEN 325 MG PO TABS
650.0000 mg | ORAL_TABLET | Freq: Four times a day (QID) | ORAL | Status: DC | PRN
  Administered 2018-09-19 – 2018-09-20 (×3): 650 mg via ORAL
  Filled 2018-09-19 (×3): qty 2

## 2018-09-19 MED ORDER — INSULIN ASPART 100 UNIT/ML ~~LOC~~ SOLN
0.0000 [IU] | Freq: Three times a day (TID) | SUBCUTANEOUS | Status: DC
  Administered 2018-09-19: 11 [IU] via SUBCUTANEOUS
  Administered 2018-09-20: 2 [IU] via SUBCUTANEOUS
  Administered 2018-09-20 (×2): 3 [IU] via SUBCUTANEOUS
  Administered 2018-09-21: 8 [IU] via SUBCUTANEOUS
  Administered 2018-09-21: 5 [IU] via SUBCUTANEOUS
  Administered 2018-09-21: 8 [IU] via SUBCUTANEOUS
  Administered 2018-09-22: 5 [IU] via SUBCUTANEOUS
  Administered 2018-09-22: 3 [IU] via SUBCUTANEOUS

## 2018-09-19 MED ORDER — DICYCLOMINE HCL 20 MG PO TABS: 20.0000 mg | ORAL_TABLET | Freq: Four times a day (QID) | ORAL | Status: DC | PRN

## 2018-09-19 MED ORDER — CLONIDINE HCL 0.1 MG PO TABS
0.1000 mg | ORAL_TABLET | ORAL | Status: DC
  Administered 2018-09-21 – 2018-09-22 (×3): 0.1 mg via ORAL
  Filled 2018-09-19 (×4): qty 1

## 2018-09-19 MED ORDER — ONDANSETRON 4 MG PO TBDP: 4.0000 mg | ORAL_TABLET | Freq: Four times a day (QID) | ORAL | Status: DC | PRN

## 2018-09-19 MED ORDER — METHOCARBAMOL 500 MG PO TABS
500.0000 mg | ORAL_TABLET | Freq: Three times a day (TID) | ORAL | Status: DC | PRN
  Filled 2018-09-19: qty 10

## 2018-09-19 MED ORDER — MAGNESIUM HYDROXIDE 400 MG/5ML PO SUSP: 30.0000 mL | Freq: Every day | ORAL | Status: DC | PRN

## 2018-09-19 MED ORDER — CLONIDINE HCL 0.1 MG PO TABS
0.1000 mg | ORAL_TABLET | Freq: Every day | ORAL | Status: DC
  Filled 2018-09-19 (×2): qty 1

## 2018-09-19 MED ORDER — HYDROXYZINE HCL 25 MG PO TABS
25.0000 mg | ORAL_TABLET | Freq: Three times a day (TID) | ORAL | Status: DC | PRN
  Administered 2018-09-20 – 2018-09-22 (×3): 25 mg via ORAL
  Filled 2018-09-19: qty 1
  Filled 2018-09-19: qty 10
  Filled 2018-09-19 (×3): qty 1

## 2018-09-19 MED ORDER — NAPROXEN 500 MG PO TABS
500.0000 mg | ORAL_TABLET | Freq: Two times a day (BID) | ORAL | Status: DC | PRN
  Administered 2018-09-20 – 2018-09-21 (×2): 500 mg via ORAL
  Filled 2018-09-19 (×2): qty 1

## 2018-09-19 MED ORDER — MIRTAZAPINE 15 MG PO TABS
15.0000 mg | ORAL_TABLET | Freq: Every day | ORAL | Status: DC
  Administered 2018-09-20 – 2018-09-21 (×2): 15 mg via ORAL
  Filled 2018-09-19 (×5): qty 1

## 2018-09-19 MED ORDER — TRAZODONE HCL 50 MG PO TABS
50.0000 mg | ORAL_TABLET | Freq: Every day | ORAL | Status: DC
  Filled 2018-09-19 (×6): qty 1

## 2018-09-19 NOTE — Tx Team (Signed)
Initial Treatment Plan 09/19/2018 5:05 AM [name] LMB:[id number]    PATIENT STRESSORS: Health problems Marital or family conflict Medication change or noncompliance Substance abuse   PATIENT STRENGTHS: Ability for insight General fund of knowledge   PATIENT IDENTIFIED PROBLEMS: Risk for suicide  SA- "crack"  "get off drugs, get some help"                 DISCHARGE CRITERIA:  Improved stabilization in mood, thinking, and/or behavior Verbal commitment to aftercare and medication compliance  PRELIMINARY DISCHARGE PLAN: Attend aftercare/continuing care group Attend PHP/IOP Outpatient therapy  PATIENT/FAMILY INVOLVEMENT: This treatment plan has been presented to and reviewed with the patient, Lisa Anderson.  The patient and family have been given the opportunity to ask questions and make suggestions.  Providence Crosby, RN 09/19/2018, 5:05 AM

## 2018-09-19 NOTE — Progress Notes (Signed)
D. Pt presents with sad affect- depressed mood- tearful -expressing thoughts of hopelessness during am med pass. Pt reports having a desire for long term treatment, but  Is discouraged, believing that she will not be accepted into a treatment center due to her being diabetic. Pt endorses passive SI without a plan and agrees to contact staff before acting on any harmful thoughts.  A. Labs and vitals monitored. Pt compliant with medications. Pt supported emotionally and encouraged to express concerns and ask questions.   R. Pt remains safe with 15 minute checks. Will continue POC.

## 2018-09-19 NOTE — H&P (Signed)
Psychiatric Admission Assessment Adult  Patient Identification: Sina Sumpter MRN:  [id number] Date of Evaluation:  09/19/2018 Chief Complaint:  mdd reurrent severe psychosis Principal Diagnosis: Suicidal thoughts in the context of cocaine binging, alcohol abuse, and homelessness Diagnosis:  Active Problems:   MDD (major depressive disorder), recurrent episode, severe ([place])  History of Present Illness:   Ms. Laskin is 62 years of age and she presented through the emergency department apparently her daughter had thrown her out of the home and she had nowhere to go she further states she has been binging on cocaine and has depressive symptoms and thoughts of harming herself she also reported recent auditory and visual hallucinations is unclear whether this is related to cocaine use or simply symptom exaggeration due to issues of secondary gain.  She is poorly cooperative with the interview process tends to repeat the same answer she gave in the emergency department stating she is wanting to harm herself by overdose and needs help with alcohol and drugs. She does not want to come to the exam room but wants her interview in the room.  She is flat and sluggish she is probably in the depressive phase post cocaine abuse - denies wanting to harm others Denies current auditory and visual hallucinations  Denies cravings/no involuntary movements/no withdrawal symptoms noted or discerned No history of seizures by report  Associated Signs/Symptoms: Depression Symptoms:  hypersomnia, (Hypo) Manic Symptoms:  Hallucinations, Anxiety Symptoms:  n/a Psychotic Symptoms:  Hallucinations: Auditory PTSD Symptoms: Had a traumatic exposure:  past abuse Total Time spent with patient: 73 m  Past Psychiatric History: past admit- remote  Is the patient at risk to self? Yes.    Has the patient been a risk to self in the past 6 months? No.  Has the patient been a risk to self within the distant past? Yes.     Is the patient a risk to others? No.  Has the patient been a risk to others in the past 6 months? No.  Has the patient been a risk to others within the distant past? No.   Prior Inpatient Therapy:   Prior Outpatient Therapy:    Alcohol Screening: 1. How often do you have a drink containing alcohol?: 2 to 4 times a month 2. How many drinks containing alcohol do you have on a typical day when you are drinking?: 1 or 2 3. How often do you have six or more drinks on one occasion?: Less than monthly AUDIT-C Score: 3 4. How often during the last year have you found that you were not able to stop drinking once you had started?: Less than monthly 5. How often during the last year have you failed to do what was normally expected from you becasue of drinking?: Never 6. How often during the last year have you needed a first drink in the morning to get yourself going after a heavy drinking session?: Never 7. How often during the last year have you had a feeling of guilt of remorse after drinking?: Never 8. How often during the last year have you been unable to remember what happened the night before because you had been drinking?: Never 9. Have you or someone else been injured as a result of your drinking?: No 10. Has a relative or friend or a doctor or another health worker been concerned about your drinking or suggested you cut down?: No Alcohol Use Disorder Identification Test Final Score (AUDIT): 4 Substance Abuse History in the last 12 months:  No. Consequences of Substance Abuse: Family Consequences:  thrown out of daughter's home Previous Psychotropic Medications: Yes  Psychological Evaluations: No  Past Medical History:  Past Medical History:  Diagnosis Date  . Asthma   . Cancer ([place])    breast  . Chronic congestive heart failure with left ventricular diastolic dysfunction ([place])   . Diabetes mellitus   . Hyperlipidemia   . Hypertension   . Stroke [facility])     Past Surgical History:   Procedure Laterality Date  . BREAST LUMPECTOMY    . EP IMPLANTABLE DEVICE N/A 08/07/2015   Procedure: Loop Recorder Insertion;  Surgeon: Thompson Grayer, MD;  Location: [facility];  Service: Cardiovascular;  Laterality: N/A;  . TEE WITHOUT CARDIOVERSION N/A 08/07/2015   Procedure: TRANSESOPHAGEAL ECHOCARDIOGRAM (TEE);  Surgeon: Sueanne Margarita, MD;  Location: [facility] ENDOSCOPY;  Service: Cardiovascular;  Laterality: N/A;  . TONSILLECTOMY     Family History:  Family History  Problem Relation Age of Onset  . Diabetes Mother   . Hyperlipidemia Mother   . Hypertension Mother   . Breast cancer Mother   . Rheum arthritis Father   . Asthma Father   . Stroke Father   . Thyroid disease Father   . Thyroid disease Brother   . Colon cancer Brother        in his 26s  . Breast cancer Sister     Tobacco Screening:   Social History:  Social History   Substance and Sexual Activity  Alcohol Use Yes  . Alcohol/week: 6.0 standard drinks  . Types: 6 Cans of beer per week   Comment: occasionally     Social History   Substance and Sexual Activity  Drug Use Not Currently  . Types: "Crack" cocaine, Cocaine   Comment: last used a year ago    Additional Social History:                           Allergies:   Allergies  Allergen Reactions  . Prednisone Hives and Other (See Comments)    Made her "crazy", suicidal  . Benicar [Olmesartan] Other (See Comments)    Did not work at all for patient  . Celexa [Citalopram] Other (See Comments)    Made patient feel crazy, fluoxetine is fine  . Effexor [Venlafaxine] Other (See Comments)    Made patient feel crazy  . Shrimp [Shellfish Allergy] Hives and Swelling   Lab Results:  Results for orders placed or performed during the hospital encounter of 09/19/18 (from the past 48 hour(s))  Glucose, capillary     Status: Abnormal   Collection Time: 09/19/18  2:05 AM  Result Value Ref Range   Glucose-Capillary 161 (H) 70 - 99 mg/dL   Glucose, capillary     Status: Abnormal   Collection Time: 09/19/18  6:07 AM  Result Value Ref Range   Glucose-Capillary 192 (H) 70 - 99 mg/dL    Blood Alcohol level:  Lab Results  Component Value Date   ETH <10 09/18/2018   ETH <5 50/38/8828    Metabolic Disorder Labs:  Lab Results  Component Value Date   HGBA1C 8.9 (A) 03/27/2018   MPG 171.42 12/06/2017   MPG 237 11/02/2015   No results found for: PROLACTIN Lab Results  Component Value Date   CHOL 98 12/06/2017   TRIG 184 (H) 12/06/2017   HDL 41 12/06/2017   CHOLHDL 2.4 12/06/2017   VLDL 37 12/06/2017   LDLCALC  20 12/06/2017   LDLCALC 127 (H) 11/02/2015    Current Medications: Current Facility-Administered Medications  Medication Dose Route Frequency Provider Last Rate Last Dose  . acetaminophen (TYLENOL) tablet 650 mg  650 mg Oral Q6H PRN Laverle Hobby, PA-C   650 mg at 09/19/18 0236  . alum & mag hydroxide-simeth (MAALOX/MYLANTA) 200-200-20 MG/5ML suspension 30 mL  30 mL Oral Q4H PRN Patriciaann Clan E, PA-C      . cloNIDine (CATAPRES) tablet 0.1 mg  0.1 mg Oral QID Patriciaann Clan E, PA-C       Followed by  . [START ON 09/21/2018] cloNIDine (CATAPRES) tablet 0.1 mg  0.1 mg Oral BH-qamhs Simon, Spencer E, PA-C       Followed by  . [START ON 09/23/2018] cloNIDine (CATAPRES) tablet 0.1 mg  0.1 mg Oral QAC breakfast Laverle Hobby, PA-C      . dicyclomine (BENTYL) tablet 20 mg  20 mg Oral Q6H PRN Laverle Hobby, PA-C      . hydrOXYzine (ATARAX/VISTARIL) tablet 25 mg  25 mg Oral TID PRN Laverle Hobby, PA-C      . insulin aspart (novoLOG) injection 0-15 Units  0-15 Units Subcutaneous TID WC Johnn Hai, MD      . loperamide (IMODIUM) capsule 2-4 mg  2-4 mg Oral PRN Laverle Hobby, PA-C      . magnesium hydroxide (MILK OF MAGNESIA) suspension 30 mL  30 mL Oral Daily PRN Laverle Hobby, PA-C      . methocarbamol (ROBAXIN) tablet 500 mg  500 mg Oral Q8H PRN Laverle Hobby, PA-C      . naproxen (NAPROSYN)  tablet 500 mg  500 mg Oral BID PRN Laverle Hobby, PA-C      . ondansetron (ZOFRAN-ODT) disintegrating tablet 4 mg  4 mg Oral Q6H PRN Laverle Hobby, PA-C      . traZODone (DESYREL) tablet 50 mg  50 mg Oral QHS Patriciaann Clan E, PA-C       PTA Medications: Medications Prior to Admission  Medication Sig Dispense Refill Last Dose  . acidophilus (RISAQUAD) CAPS capsule Take 1 capsule by mouth daily.   Taking  . albuterol (PROVENTIL HFA;VENTOLIN HFA) 108 (90 Base) MCG/ACT inhaler Inhale 2 puffs into the lungs every 6 (six) hours as needed for wheezing or shortness of breath. Shortness of breath 1 Inhaler 3 09/04/2018  . amoxicillin (AMOXIL) 500 MG capsule Take 1 capsule (500 mg total) by mouth 3 (three) times daily. (Patient not taking: Reported on 09/18/2018) 30 capsule 0 Completed Course at Unknown time  . Blood Glucose Monitoring Suppl (CONTOUR NEXT MONITOR) w/Device KIT 1 strip 3 (three) times daily by Does not apply route. E11.9 1 kit 0 Taking  . Cholecalciferol (VITAMIN D PO) Take 1-2 tablets by mouth daily.   09/04/2018  . clindamycin (CLEOCIN) 300 MG capsule Take 1 capsule (300 mg total) by mouth 3 (three) times daily. X 7 days (Patient not taking: Reported on 09/18/2018) 21 capsule 0 Completed Course at Unknown time  . clopidogrel (PLAVIX) 75 MG tablet Take 1 tablet (75 mg total) by mouth daily. 30 tablet 3 09/04/2018  . diltiazem (CARDIZEM CD) 360 MG 24 hr capsule Take 1 capsule (360 mg total) by mouth daily. 30 capsule 3 09/04/2018  . DULoxetine (CYMBALTA) 60 MG capsule Take 1 capsule (60 mg total) by mouth daily. For mood control (Patient not taking: Reported on 09/18/2018) 30 capsule 3 Not Taking at Unknown time  . escitalopram (LEXAPRO)  20 MG tablet Take 20 mg by mouth daily.   09/04/2018  . gabapentin (NEURONTIN) 300 MG capsule Take 2 capsules (600 mg total) by mouth 2 (two) times daily. (Patient not taking: Reported on 09/18/2018) 120 capsule 3 Not Taking at Unknown time  .  glipiZIDE (GLUCOTROL) 10 MG tablet Take 1 tablet (10 mg total) by mouth 2 (two) times daily before a meal. (Patient not taking: Reported on 09/18/2018) 60 tablet 3 Not Taking at Unknown time  . glucose blood (CONTOUR NEXT TEST) test strip Use as instructed 3 times daily. E11.9 100 each 12 Taking  . hydrALAZINE (APRESOLINE) 50 MG tablet Take 1 tablet (50 mg total) by mouth 2 (two) times daily. For High blood pressure 60 tablet 3 09/04/2018  . hydrochlorothiazide (MICROZIDE) 12.5 MG capsule Take 1 capsule (12.5 mg total) by mouth daily. 30 capsule 3 09/04/2018  . insulin aspart (NOVOLOG) 100 UNIT/ML injection As per sliding scale 1 vial 12 09/04/2018  . insulin glargine (LANTUS) 100 UNIT/ML injection Inject 0.2 mLs (20 Units total) into the skin at bedtime. 30 mL 3 Taking  . metFORMIN (GLUCOPHAGE) 1000 MG tablet Take 1 tablet (1,000 mg total) by mouth 2 (two) times daily with a meal. 60 tablet 3 09/04/2018  . nicotine (NICODERM CQ) 7 mg/24hr patch Place 1 patch (7 mg total) onto the skin daily. 28 patch 0 09/04/2018  . potassium chloride SA (KLOR-CON M20) 20 MEQ tablet Take 1 tablet (20 mEq total) by mouth daily. 30 tablet 3 09/04/2018  . ranitidine (ZANTAC) 150 MG tablet Take 1 tablet (150 mg total) by mouth 2 (two) times daily. (Patient not taking: Reported on 09/18/2018) 60 tablet 3 Not Taking at Unknown time  . rosuvastatin (CRESTOR) 40 MG tablet Take 1 tablet (40 mg total) by mouth daily. 30 tablet 3 09/04/2018  . traZODone (DESYREL) 50 MG tablet Take 1 tablet (50 mg total) by mouth at bedtime as needed for sleep. 30 tablet 3 09/04/2018    Musculoskeletal: Strength & Muscle Tone: within normal limits Gait & Station: normal Patient leans: N/A  Psychiatric Specialty Exam: Physical Exam  ROS  Blood pressure 131/61, pulse 87, temperature 98.6 F (37 C), temperature source Oral, resp. rate 16, height '5\' 5"'  (1.651 m), weight 63.5 kg.Body mass index is 23.3 kg/m.  General Appearance: Casual   Eye Contact:  None  Speech:  Slow  Volume:  Decreased  Mood:  Dysphoric  Affect:  Blunt  Thought Process:  Goal Directed  Orientation:  Full (Time, Place, and Person)  Thought Content:  Logical  Suicidal Thoughts:  Yes.  without intent/plan  Homicidal Thoughts:  No  Memory:  Immediate;   Fair  Judgement:  Fair  Insight:  Fair  Psychomotor Activity:  Decreased  Concentration:  Concentration: Fair  Recall:  [employer] of Knowledge:  Fair  Language:  Fair  Akathisia:  Negative  Handed:  Right  AIMS (if indicated):     Assets:  Desire for Improvement Resilience  ADL's:  Intact  Cognition:  WNL  Sleep:  Number of Hours: 2.5    Treatment Plan Summary: Daily contact with patient to assess and evaluate symptoms and progress in treatment and Medication management  Observation Level/Precautions:  Detox  Laboratory:  UDS  Psychotherapy: Cognitive and rehab based  Medications: Add insulin sliding scale add antidepressant  Consultations: Needed  Discharge Concerns: Housing/sobriety  Estimated LOS: 4-5  Other: See orders   Physician Treatment Plan for Primary Diagnosis: <principal problem not specified> Long  Term Goal(s): Improvement in symptoms so as ready for discharge  Short Term Goals: Ability to verbalize feelings will improve  Physician Treatment Plan for Secondary Diagnosis: Active Problems:   MDD (major depressive disorder), recurrent episode, severe ([facility])  Long Term Goal(s): Improvement in symptoms so as ready for discharge  Short Term Goals: Ability to disclose and discuss suicidal ideas  I certify that inpatient services furnished can reasonably be expected to improve the patient's condition.    Johnn Hai, MD 12/25/20197:06 AM

## 2018-09-19 NOTE — [facility] Suicide Risk Assessment (Signed)
[facility] Admission Suicide Risk Assessment   Nursing information obtained from:  Patient Demographic factors:  Living alone, Low socioeconomic status, Unemployed Current Mental Status:  Suicidal ideation indicated by patient, Suicide plan Loss Factors:  NA Historical Factors:  Prior suicide attempts Risk Reduction Factors:  NA  Total Time spent with patient: 45 minutes Principal Problem: <principal problem not specified> Diagnosis:  Active Problems:   MDD (major depressive disorder), recurrent episode, severe ([facility])  Subjective Data: Probable issues of secondary gain driving complaints of suicidal thoughts  Continued Clinical Symptoms:  Alcohol Use Disorder Identification Test Final Score (AUDIT): 4 The "Alcohol Use Disorders Identification Test", Guidelines for Use in Primary Care, Second Edition.  World Pharmacologist [facility]). Score between 0-7:  no or low risk or alcohol related problems. Score between 8-15:  moderate risk of alcohol related problems. Score between 16-19:  high risk of alcohol related problems. Score 20 or above:  warrants further diagnostic evaluation for alcohol dependence and treatment.   CLINICAL FACTORS:   Depression:   Comorbid alcohol abuse/dependence Dysthymia      COGNITIVE FEATURES THAT CONTRIBUTE TO RISK:  Polarized thinking    SUICIDE RISK:   Mild:  Suicidal ideation of limited frequency, intensity, duration, and specificity.  There are no identifiable plans, no associated intent, mild dysphoria and related symptoms, good self-control (both objective and subjective assessment), few other risk factors, and identifiable protective factors, including available and accessible social support.  PLAN OF CARE: see HPI-  I certify that inpatient services furnished can reasonably be expected to improve the patient's condition.   Johnn Hai, MD 09/19/2018, 7:05 AM

## 2018-09-19 NOTE — Progress Notes (Signed)
Admission Note:  62 yr female who presents [facility] in no acute distress for the treatment of SI and substance abuse. Pt appears flat and depressed. Pt was calm and cooperative with admission process. Pt presents with passive SI and contracts for safety upon admission. Pt denies AVH . Pt  Became nauseated and very weak during assessment pt Bp- 110/46 sitting, pt completed paperwork and brought back to the unit ortho Bp taken: L- 141/ 61 , Sit-134/ 64 , Stand- 131/ 61 . Pt given Tylenol per MAR for HA . PA- Spencer notified - will continue to monitor.    A:Skin was assessed (RN-Kim) and found to be clear of any abnormal marksa. PT searched and no contraband found, POC and unit policies explained and understanding verbalized. Consents obtained. Food and fluids offered, and fluids accepted.  R: Pt had no additional questions or concerns.

## 2018-09-19 NOTE — ED Notes (Signed)
Report called to RN Ronalee Belts, [place]  Pending [facility] transport.

## 2018-09-20 LAB — GLUCOSE, CAPILLARY
GLUCOSE-CAPILLARY: 150 mg/dL — AB (ref 70–99)
GLUCOSE-CAPILLARY: 191 mg/dL — AB (ref 70–99)
Glucose-Capillary: 172 mg/dL — ABNORMAL HIGH (ref 70–99)
Glucose-Capillary: 158 mg/dL — ABNORMAL HIGH (ref 70–99)

## 2018-09-20 LAB — CBG MONITORING, ED: Glucose-Capillary: 211 mg/dL — ABNORMAL HIGH (ref 70–99)

## 2018-09-20 MED ORDER — GABAPENTIN 600 MG PO TABS
600.0000 mg | ORAL_TABLET | Freq: Two times a day (BID) | ORAL | Status: DC
  Administered 2018-09-20 – 2018-09-21 (×3): 600 mg via ORAL
  Filled 2018-09-20 (×10): qty 1

## 2018-09-20 MED ORDER — FAMOTIDINE 20 MG PO TABS
20.0000 mg | ORAL_TABLET | Freq: Two times a day (BID) | ORAL | Status: DC
  Administered 2018-09-20 – 2018-09-22 (×5): 20 mg via ORAL
  Filled 2018-09-20 (×10): qty 1

## 2018-09-20 MED ORDER — DILTIAZEM HCL ER COATED BEADS 120 MG PO CP24
240.0000 mg | ORAL_CAPSULE | Freq: Every day | ORAL | Status: DC
  Administered 2018-09-20 – 2018-09-22 (×3): 240 mg via ORAL
  Filled 2018-09-20 (×2): qty 1
  Filled 2018-09-20: qty 2
  Filled 2018-09-20 (×3): qty 1

## 2018-09-20 MED ORDER — GLIPIZIDE 5 MG PO TABS
10.0000 mg | ORAL_TABLET | Freq: Every day | ORAL | Status: DC
  Administered 2018-09-21 – 2018-09-22 (×2): 10 mg via ORAL
  Filled 2018-09-20 (×4): qty 1

## 2018-09-20 MED ORDER — CLOPIDOGREL BISULFATE 75 MG PO TABS
75.0000 mg | ORAL_TABLET | Freq: Every day | ORAL | Status: DC
  Administered 2018-09-20 – 2018-09-22 (×3): 75 mg via ORAL
  Filled 2018-09-20 (×6): qty 1

## 2018-09-20 NOTE — Progress Notes (Signed)
Did not attend group 

## 2018-09-20 NOTE — [facility] Suicide Risk Assessment (Signed)
[place] INPATIENT:  Family/Significant Other Suicide Prevention Education  Suicide Prevention Education:  Contact Attempts: Laurine Kuyper, brother, [phone], has been identified by the patient as the family member/significant other with whom the patient will be residing, and identified as the person(s) who will aid the patient in the event of a mental health crisis.  With written consent from the patient, two attempts were made to provide suicide prevention education, prior to and/or following the patient's discharge.  We were unsuccessful in providing suicide prevention education.  A suicide education pamphlet was given to the patient to share with family/significant other.  Date and time of first attempt:09/20/18, 1250 Date and time of second attempt:  Joanne Chars, LCSW 09/20/2018, 12:50 PM

## 2018-09-20 NOTE — Progress Notes (Addendum)
Pt observed resting in bed with eyes closed. Pt was awoken for assessment. Pt did not attend group. Pt appears flat/depressed in affect and mood. Pt is isolative to room; with the exception of snacks.Pt denies SI/HI/AVH at this time. Rates pain 8/10; Headache.Pt c/o of withdrawal s/s. Pt encourage to push fluids. Water pitcher filled for Pt. CBG was 191 HS. Support and encouragement provided. Will continue with POC.
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
## 2018-09-20 NOTE — Progress Notes (Signed)
Pt in room in bed.  Pt sts she feels hopeless and has SI thoughts with no plan.  Pt is sad, depressed and isolating from peers.  Pt has CBG checked with BS= to 280.  Provider notified as there are no night coverage orders.  Pt denies pain or discomfort at this time. Pt refuses all meds.  "Im just too tired to take them".  Pt does not attend group. Pt remains safe in her room in bed.

## 2018-09-20 NOTE — Progress Notes (Signed)
Recreation Therapy Notes  INPATIENT RECREATION THERAPY ASSESSMENT  Patient Details Name: Lisa Anderson MRN: [id number] DOB: October 21, 1955 Today's Date: 09/20/2018       Information Obtained From: Patient  Able to Participate in Assessment/Interview: Yes  Patient Presentation: Alert  Reason for Admission (Per Patient): Suicidal Ideation, Substance Abuse  Patient Stressors: Other (Comment)(Living situation)  Coping Skills:   Isolation, TV, Music, Meditate, Substance Abuse, Prayer, Avoidance, Read  Leisure Interests (2+):  Individual - Reading, Individual - TV, Individual - Other (Comment)(Be alone)  Frequency of Recreation/Participation: Weekly(Daily)  Awareness of Community Resources:  Yes  Community Resources:  Library, Other (Comment)(Store)  Current Use: Yes  If no, Barriers?:    Expressed Interest in [place]: No  County of Residence:  [place]  Patient Main Form of Transportation: Car  Patient Strengths:  Resilient; Positive thinker  Patient Identified Areas of Improvement:  Taking suggestions; Accept I can't do this by myself  Patient Goal for Hospitalization:  "Get physical strenght/mental strength up"  Current SI (including self-harm):  No  Current HI:  No  Current AVH: No  Staff Intervention Plan: Group Attendance, Collaborate with Interdisciplinary Treatment Team  Consent to Intern Participation: N/A    Victorino Sparrow, LRT/CTRS  Victorino Sparrow A 09/20/2018, 2:51 PM

## 2018-09-20 NOTE — Progress Notes (Addendum)
Inpatient Diabetes Program Recommendations  AACE/ADA: New Consensus Statement on Inpatient Glycemic Control (2015)  Target Ranges:  Prepandial:   less than 140 mg/dL      Peak postprandial:   less than 180 mg/dL (1-2 hours)      Critically ill patients:  140 - 180 mg/dL   Results for TAURA, LAMARRE (MRN [id number]) as of 09/20/2018 10:33  Ref. Range 09/19/2018 06:07 09/19/2018 11:55 09/19/2018 16:52 09/19/2018 20:44  Glucose-Capillary Latest Ref Range: 70 - 99 mg/dL 192 (H) 319 (H)  11 units NOVOLOG  119 (H) 280 (H)   Results for BRIANNE, MAINA (MRN [id number]) as of 09/20/2018 10:33  Ref. Range 09/20/2018 06:02  Glucose-Capillary Latest Ref Range: 70 - 99 mg/dL 172 (H)  3 units NOVOLOG     Admit with: MDD (major depressive disorder)/ Pt states intention to harm herself by overdose and needs help with alcohol and drugs.  History: DM  Home DM Meds: Glipizide 10 mg BID (NOT taking)       Metformin 1000 mg BID       Lantus 20 units QHS (NOT taking)       Novolog per SSI  Current Orders: Glipizide 10 mg Daily      Novolog Moderate Correction Scale/ SSI (0-15 units) TID AC      MD- Please consider the following in-hospital insulin adjustments:  1. Start Lantus 6 units QHS (0.1 units/kg dosing based on weight of 63 kg)  2. Start low dose Novolog Meal Coverage: Novolog 3 units TID with meals  (Please add the following Hold Parameters: Hold if pt eats <50% of meal, Hold if pt NPO)      --Will follow patient during hospitalization--  Wyn Quaker RN, MSN, CDE Diabetes Coordinator Inpatient Glycemic Control Team Team Pager: [phone] (8a-5p)

## 2018-09-20 NOTE — Plan of Care (Signed)
Progress note  D: pt found in the dayroom; compliant with medication administration. Pt state she slept well. Pt rates her depression/hopelessness/anxiety 8/8/8 out of 10 respectively. Pt complains of tremors, cravings, agitation, runny nose, and irritability. Pt also complains of a headache that she rates at a 7/10 in her back. Pt states her goal for today is to feel better physically and she will achieve this by taking it one day at a time. Pt denies any si/hi but still complains of seeing and hearing things, though not at this time. Pt agrees to approach staff if these thoughts become apparent or before harming herself while at [facility]. A: pt provided support and encouragement. Pt given medication per protocol and standing orders. Q23m safety checks implemented and continued.  R: pt safe on the unit. Will continue to monitor.   Pt progressing in the following metrics  Problem: Education: Goal: Knowledge of  General Education information/materials will improve Outcome: Progressing Goal: Emotional status will improve Outcome: Progressing Goal: Mental status will improve Outcome: Progressing Goal: Verbalization of understanding the information provided will improve Outcome: Progressing

## 2018-09-20 NOTE — Progress Notes (Signed)
[facility] MD Progress Note  09/20/2018 7:51 AM Lisa Anderson  MRN:  [id number] Subjective:    Patient seen in her room she is isolative and refused some of her medication last night stating she was simply tired of taking medication she is encouraged towards compliance.  States she has suicidal thoughts intermittently now without plans to harm her self here, is frustrated at being homeless and frustrated at her chronic drug use.  Is hoping to find some type of rehab. No involuntary movements.  No obvious withdrawal symptoms denies hallucinations  Principal Problem: Depression/substance dependence and abuse/homelessness Diagnosis: Active Problems:   MDD (major depressive disorder), recurrent episode, severe (HCC)  Total Time spent with patient: 20 minutes   Past Medical History:  Past Medical History:  Diagnosis Date  . Asthma   . Cancer ([place])    breast  . Chronic congestive heart failure with left ventricular diastolic dysfunction ([place])   . Diabetes mellitus   . Hyperlipidemia   . Hypertension   . Stroke [facility])     Past Surgical History:  Procedure Laterality Date  . BREAST LUMPECTOMY    . EP IMPLANTABLE DEVICE N/A 08/07/2015   Procedure: Loop Recorder Insertion;  Surgeon: Thompson Grayer, MD;  Location: [facility] CV LAB;  Service: Cardiovascular;  Laterality: N/A;  . TEE WITHOUT CARDIOVERSION N/A 08/07/2015   Procedure: TRANSESOPHAGEAL ECHOCARDIOGRAM (TEE);  Surgeon: Sueanne Margarita, MD;  Location: [facility] ENDOSCOPY;  Service: Cardiovascular;  Laterality: N/A;  . TONSILLECTOMY     Family History:  Family History  Problem Relation Age of Onset  . Diabetes Mother   . Hyperlipidemia Mother   . Hypertension Mother   . Breast cancer Mother   . Rheum arthritis Father   . Asthma Father   . Stroke Father   . Thyroid disease Father   . Thyroid disease Brother   . Colon cancer Brother        in his 6s  . Breast cancer Sister    Family Psychiatric  History: no new data Social History:   Social History   Substance and Sexual Activity  Alcohol Use Yes  . Alcohol/week: 6.0 standard drinks  . Types: 6 Cans of beer per week   Comment: occasionally     Social History   Substance and Sexual Activity  Drug Use Not Currently  . Types: "Crack" cocaine, Cocaine   Comment: last used a year ago    Social History   Socioeconomic History  . Marital status: Single    Spouse name: Not on file  . Number of children: 5  . Years of education: 46  . Highest education level: Not on file  Occupational History  . Occupation: CNA- [employer]  . Financial resource strain: Not on file  . Food insecurity:    Worry: Not on file    Inability: Not on file  . Transportation needs:    Medical: Not on file    Non-medical: Not on file  Tobacco Use  . Smoking status: Current Every Day Smoker    Packs/day: 0.25    Types: Cigarettes  . Smokeless tobacco: Never Used  . Tobacco comment: pt smokes 8 per day  Substance and Sexual Activity  . Alcohol use: Yes    Alcohol/week: 6.0 standard drinks    Types: 6 Cans of beer per week    Comment: occasionally  . Drug use: Not Currently    Types: "Crack" cocaine, Cocaine    Comment: last used  a year ago  . Sexual activity: Never    Comment: recovering cocaine addict x6 months  Lifestyle  . Physical activity:    Days per week: Not on file    Minutes per session: Not on file  . Stress: Not on file  Relationships  . Social connections:    Talks on phone: Not on file    Gets together: Not on file    Attends religious service: Not on file    Active member of club or organization: Not on file    Attends meetings of clubs or organizations: Not on file    Relationship status: Not on file  Other Topics Concern  . Not on file  Social History Narrative   Lives with daughter      Caffeine use: Drinks soda (2- 20 oz soda per day)   Additional Social History:                         Sleep:  Good  Appetite:  Fair  Current Medications: Current Facility-Administered Medications  Medication Dose Route Frequency Provider Last Rate Last Dose  . acetaminophen (TYLENOL) tablet 650 mg  650 mg Oral Q6H PRN Laverle Hobby, PA-C   650 mg at 09/19/18 0236  . alum & mag hydroxide-simeth (MAALOX/MYLANTA) 200-200-20 MG/5ML suspension 30 mL  30 mL Oral Q4H PRN Laverle Hobby, PA-C      . cloNIDine (CATAPRES) tablet 0.1 mg  0.1 mg Oral QID Patriciaann Clan E, PA-C   0.1 mg at 09/19/18 1733   Followed by  . [START ON 09/21/2018] cloNIDine (CATAPRES) tablet 0.1 mg  0.1 mg Oral BH-qamhs Simon, Spencer E, PA-C       Followed by  . [START ON 09/23/2018] cloNIDine (CATAPRES) tablet 0.1 mg  0.1 mg Oral QAC breakfast Laverle Hobby, PA-C      . dicyclomine (BENTYL) tablet 20 mg  20 mg Oral Q6H PRN Laverle Hobby, PA-C      . hydrOXYzine (ATARAX/VISTARIL) tablet 25 mg  25 mg Oral TID PRN Patriciaann Clan E, PA-C      . insulin aspart (novoLOG) injection 0-15 Units  0-15 Units Subcutaneous TID WC Johnn Hai, MD   3 Units at 09/20/18 0617  . loperamide (IMODIUM) capsule 2-4 mg  2-4 mg Oral PRN Patriciaann Clan E, PA-C      . magnesium hydroxide (MILK OF MAGNESIA) suspension 30 mL  30 mL Oral Daily PRN Laverle Hobby, PA-C      . methocarbamol (ROBAXIN) tablet 500 mg  500 mg Oral Q8H PRN Laverle Hobby, PA-C      . mirtazapine (REMERON) tablet 15 mg  15 mg Oral QHS Johnn Hai, MD      . naproxen (NAPROSYN) tablet 500 mg  500 mg Oral BID PRN Laverle Hobby, PA-C      . ondansetron (ZOFRAN-ODT) disintegrating tablet 4 mg  4 mg Oral Q6H PRN Laverle Hobby, PA-C      . traZODone (DESYREL) tablet 50 mg  50 mg Oral QHS Laverle Hobby, PA-C        Lab Results:  Results for orders placed or performed during the hospital encounter of 09/19/18 (from the past 48 hour(s))  Glucose, capillary     Status: Abnormal   Collection Time: 09/19/18  2:05 AM  Result Value Ref Range   Glucose-Capillary 161  (H) 70 - 99 mg/dL  Glucose, capillary     Status:  Abnormal   Collection Time: 09/19/18  6:07 AM  Result Value Ref Range   Glucose-Capillary 192 (H) 70 - 99 mg/dL  Glucose, capillary     Status: Abnormal   Collection Time: 09/19/18 11:55 AM  Result Value Ref Range   Glucose-Capillary 319 (H) 70 - 99 mg/dL  Glucose, capillary     Status: Abnormal   Collection Time: 09/19/18  4:52 PM  Result Value Ref Range   Glucose-Capillary 119 (H) 70 - 99 mg/dL  Glucose, capillary     Status: Abnormal   Collection Time: 09/19/18  8:44 PM  Result Value Ref Range   Glucose-Capillary 280 (H) 70 - 99 mg/dL   Comment 1 Notify RN   Glucose, capillary     Status: Abnormal   Collection Time: 09/20/18  6:02 AM  Result Value Ref Range   Glucose-Capillary 172 (H) 70 - 99 mg/dL   Comment 1 Notify RN     Blood Alcohol level:  Lab Results  Component Value Date   ETH <10 09/18/2018   ETH <5 00/93/8182    Metabolic Disorder Labs: Lab Results  Component Value Date   HGBA1C 8.9 (A) 03/27/2018   MPG 171.42 12/06/2017   MPG 237 11/02/2015   No results found for: PROLACTIN Lab Results  Component Value Date   CHOL 98 12/06/2017   TRIG 184 (H) 12/06/2017   HDL 41 12/06/2017   CHOLHDL 2.4 12/06/2017   VLDL 37 12/06/2017   LDLCALC 20 12/06/2017   LDLCALC 127 (H) 11/02/2015    Physical Findings: AIMS: Facial and Oral Movements Muscles of Facial Expression: None, normal Lips and Perioral Area: None, normal Jaw: None, normal Tongue: None, normal,Extremity Movements Upper (arms, wrists, hands, fingers): None, normal Lower (legs, knees, ankles, toes): None, normal, Trunk Movements Neck, shoulders, hips: None, normal, Overall Severity Severity of abnormal movements (highest score from questions above): None, normal Incapacitation due to abnormal movements: None, normal Patient's awareness of abnormal movements (rate only patient's report): No Awareness, Dental Status Current problems with teeth  and/or dentures?: Yes Does patient usually wear dentures?: Yes  CIWA:  CIWA-Ar Total: 6 COWS:  COWS Total Score: 1  Musculoskeletal: Strength & Muscle Tone: within normal limits Gait & Station: normal  Psychiatric Specialty Exam: Physical Exam  ROS  Blood pressure (!) 144/64, pulse 73, temperature 98.1 F (36.7 C), temperature source Oral, resp. rate 16, height 5\' 5"  (1.651 m), weight 63.5 kg.Body mass index is 23.3 kg/m.  General Appearance: Casual  Eye Contact:  Fair  Speech:  Slow  Volume:  Decreased  Mood:  Anxious and Depressed  Affect:  Congruent  Thought Process:  Goal Directed  Orientation:  Full (Time, Place, and Person)  Thought Content:  Logical  Suicidal Thoughts:  Yes.  without intent/plan  Homicidal Thoughts:  No  Memory:  Immediate;   Fair  Judgement:  Fair  Insight:  Fair  Psychomotor Activity:  Decreased  Concentration:  Concentration: Good  Recall:  Good  Fund of Knowledge:  Good  Language:  Good  Akathisia:  Negative  Handed:  Right  AIMS (if indicated):     Assets:  Physical Health  ADL's:  Intact  Cognition:  WNL  Sleep:  Number of Hours: 6    Continue current med regimen hopefully she will comply continue to encourage compliance continue cognitive-based therapy for depression rehab based therapy monitor for for withdrawal symptoms continue to seek rehab Treatment Plan Summary: Daily contact with patient to assess and evaluate symptoms and  progress in treatment and Medication management  Johnn Hai, MD 09/20/2018, 7:51 AM

## 2018-09-20 NOTE — Progress Notes (Signed)
Recreation Therapy Notes  Date: 12.26.19 Time: 1000 Location: 500 Hall Dayroom  Group Topic: Triggers  Goal Area(s) Addresses:  Patient will identify their triggers. Patient will identify ways to avoid triggers. Patient will identify ways to face triggers head on.  Intervention: Worksheet  Activity: Triggers.  Patients were to identify the problem their triggers contribute to.  Patients were to also identify their biggest triggers, ways to avoid/reduce exposure to their triggers and ways to deal with triggers head on.  Education: Communication, Discharge Planning  Education Outcome: Acknowledges understanding/In group clarification offered/Needs additional education.   Clinical Observations/Feedback: Pt did not attend group.    Victorino Sparrow, LRT/CTRS         Victorino Sparrow A 09/20/2018 11:44 AM

## 2018-09-21 LAB — GLUCOSE, CAPILLARY
GLUCOSE-CAPILLARY: 136 mg/dL — AB (ref 70–99)
Glucose-Capillary: 263 mg/dL — ABNORMAL HIGH (ref 70–99)
Glucose-Capillary: 208 mg/dL — ABNORMAL HIGH (ref 70–99)
Glucose-Capillary: 277 mg/dL — ABNORMAL HIGH (ref 70–99)

## 2018-09-21 NOTE — Progress Notes (Addendum)
Pt observed resting in bed with eyes open.Pt appears flat/depressed/irritable in affect and mood. Pt is isolative to room. Pt denies SI/HI/AVH at this time. Rates pain 8/10; Headache. Pt c/o of increasing anxiety.Pt encourage to push fluids.CBG was 136 HS. Support and encouragement provided. Will continue with POC.

## 2018-09-21 NOTE — Progress Notes (Signed)
[facility] MD Progress Note  09/21/2018 10:00 AM Lisa Anderson  MRN:  [id number] Subjective:    Patient in bed stating she needs rehab services but has not made any efforts or self to find them she denies wanting to harm self Does not want to harm others no acute psychosis no cravings tremors or withdrawal again seeking rehab services but hoping the staff will do all of the work for her in arranging  Principal Problem: Substance abuse depression issues of secondary gain diagnosis: Active Problems:   MDD (major depressive disorder), recurrent episode, severe ([place])  Total Time spent with patient: 20 minutes Past Medical History:  Past Medical History:  Diagnosis Date  . Asthma   . Cancer ([place])    breast  . Chronic congestive heart failure with left ventricular diastolic dysfunction ([place])   . Diabetes mellitus   . Hyperlipidemia   . Hypertension   . Stroke [facility] : [facility])     Past Surgical History:  Procedure Laterality Date  . BREAST LUMPECTOMY    . EP IMPLANTABLE DEVICE N/A 08/07/2015   Procedure: Loop Recorder Insertion;  Surgeon: Thompson Grayer, MD;  Location: [place] CV LAB;  Service: Cardiovascular;  Laterality: N/A;  . TEE WITHOUT CARDIOVERSION N/A 08/07/2015   Procedure: TRANSESOPHAGEAL ECHOCARDIOGRAM (TEE);  Surgeon: Sueanne Margarita, MD;  Location: [facility] ENDOSCOPY;  Service: Cardiovascular;  Laterality: N/A;  . TONSILLECTOMY     Family History:  Family History  Problem Relation Age of Onset  . Diabetes Mother   . Hyperlipidemia Mother   . Hypertension Mother   . Breast cancer Mother   . Rheum arthritis Father   . Asthma Father   . Stroke Father   . Thyroid disease Father   . Thyroid disease Brother   . Colon cancer Brother        in his 31s  . Breast cancer Sister     Social History:  Social History   Substance and Sexual Activity  Alcohol Use Yes  . Alcohol/week: 6.0 standard drinks  . Types: 6 Cans of beer per week   Comment: occasionally     Social History    Substance and Sexual Activity  Drug Use Not Currently  . Types: "Crack" cocaine, Cocaine   Comment: last used a year ago    Social History   Socioeconomic History  . Marital status: Single    Spouse name: Not on file  . Number of children: 5  . Years of education: 34  . Highest education level: Not on file  Occupational History  . Occupation: CNA- [employer]  . Financial resource strain: Not on file  . Food insecurity:    Worry: Not on file    Inability: Not on file  . Transportation needs:    Medical: Not on file    Non-medical: Not on file  Tobacco Use  . Smoking status: Current Every Day Smoker    Packs/day: 0.25    Types: Cigarettes  . Smokeless tobacco: Never Used  . Tobacco comment: pt smokes 8 per day  Substance and Sexual Activity  . Alcohol use: Yes    Alcohol/week: 6.0 standard drinks    Types: 6 Cans of beer per week    Comment: occasionally  . Drug use: Not Currently    Types: "Crack" cocaine, Cocaine    Comment: last used a year ago  . Sexual activity: Never    Comment: recovering cocaine addict x6 months  Lifestyle  . Physical activity:  Days per week: Not on file    Minutes per session: Not on file  . Stress: Not on file  Relationships  . Social connections:    Talks on phone: Not on file    Gets together: Not on file    Attends religious service: Not on file    Active member of club or organization: Not on file    Attends meetings of clubs or organizations: Not on file    Relationship status: Not on file  Other Topics Concern  . Not on file  Social History Narrative   Lives with daughter      Caffeine use: Drinks soda (2- 20 oz soda per day)   Additional Social History:                         Sleep: good  Appetite:  Good  Current Medications: Current Facility-Administered Medications  Medication Dose Route Frequency Provider Last Rate Last Dose  . acetaminophen (TYLENOL) tablet 650 mg  650  mg Oral Q6H PRN Laverle Hobby, PA-C   650 mg at 09/20/18 1659  . alum & mag hydroxide-simeth (MAALOX/MYLANTA) 200-200-20 MG/5ML suspension 30 mL  30 mL Oral Q4H PRN Patriciaann Clan E, PA-C      . cloNIDine (CATAPRES) tablet 0.1 mg  0.1 mg Oral BH-qamhs Simon, Spencer E, PA-C   0.1 mg at 09/21/18 0745   Followed by  . [START ON 09/23/2018] cloNIDine (CATAPRES) tablet 0.1 mg  0.1 mg Oral QAC breakfast Patriciaann Clan E, PA-C      . clopidogrel (PLAVIX) tablet 75 mg  75 mg Oral Daily Johnn Hai, MD   75 mg at 09/21/18 0745  . dicyclomine (BENTYL) tablet 20 mg  20 mg Oral Q6H PRN Laverle Hobby, PA-C      . diltiazem (CARDIZEM CD) 24 hr capsule 240 mg  240 mg Oral Daily Johnn Hai, MD   240 mg at 09/21/18 0746  . famotidine (PEPCID) tablet 20 mg  20 mg Oral BID Johnn Hai, MD   20 mg at 09/21/18 0745  . gabapentin (NEURONTIN) tablet 600 mg  600 mg Oral BID Johnn Hai, MD   600 mg at 09/21/18 0745  . glipiZIDE (GLUCOTROL) tablet 10 mg  10 mg Oral QAC breakfast Johnn Hai, MD   10 mg at 09/21/18 6503  . hydrOXYzine (ATARAX/VISTARIL) tablet 25 mg  25 mg Oral TID PRN Patriciaann Clan E, PA-C   25 mg at 09/20/18 0754  . insulin aspart (novoLOG) injection 0-15 Units  0-15 Units Subcutaneous TID WC Johnn Hai, MD   8 Units at 09/21/18 [phone]  . loperamide (IMODIUM) capsule 2-4 mg  2-4 mg Oral PRN Patriciaann Clan E, PA-C      . magnesium hydroxide (MILK OF MAGNESIA) suspension 30 mL  30 mL Oral Daily PRN Laverle Hobby, PA-C      . methocarbamol (ROBAXIN) tablet 500 mg  500 mg Oral Q8H PRN Laverle Hobby, PA-C      . mirtazapine (REMERON) tablet 15 mg  15 mg Oral QHS Johnn Hai, MD   15 mg at 09/20/18 2133  . naproxen (NAPROSYN) tablet 500 mg  500 mg Oral BID PRN Laverle Hobby, PA-C   500 mg at 09/20/18 2136  . ondansetron (ZOFRAN-ODT) disintegrating tablet 4 mg  4 mg Oral Q6H PRN Laverle Hobby, PA-C      . traZODone (DESYREL) tablet 50 mg  50 mg Oral QHS Simon,  Maurine Minister, PA-C         Lab Results:  Results for orders placed or performed during the hospital encounter of 09/19/18 (from the past 48 hour(s))  Glucose, capillary     Status: Abnormal   Collection Time: 09/19/18 11:55 AM  Result Value Ref Range   Glucose-Capillary 319 (H) 70 - 99 mg/dL  Glucose, capillary     Status: Abnormal   Collection Time: 09/19/18  4:52 PM  Result Value Ref Range   Glucose-Capillary 119 (H) 70 - 99 mg/dL  Glucose, capillary     Status: Abnormal   Collection Time: 09/19/18  8:44 PM  Result Value Ref Range   Glucose-Capillary 280 (H) 70 - 99 mg/dL   Comment 1 Notify RN   Glucose, capillary     Status: Abnormal   Collection Time: 09/20/18  6:02 AM  Result Value Ref Range   Glucose-Capillary 172 (H) 70 - 99 mg/dL   Comment 1 Notify RN   Glucose, capillary     Status: Abnormal   Collection Time: 09/20/18 12:02 PM  Result Value Ref Range   Glucose-Capillary 158 (H) 70 - 99 mg/dL  Glucose, capillary     Status: Abnormal   Collection Time: 09/20/18  4:54 PM  Result Value Ref Range   Glucose-Capillary 150 (H) 70 - 99 mg/dL  Glucose, capillary     Status: Abnormal   Collection Time: 09/20/18  8:25 PM  Result Value Ref Range   Glucose-Capillary 191 (H) 70 - 99 mg/dL  Glucose, capillary     Status: Abnormal   Collection Time: 09/21/18  5:47 AM  Result Value Ref Range   Glucose-Capillary 263 (H) 70 - 99 mg/dL    Blood Alcohol level:  Lab Results  Component Value Date   ETH <10 09/18/2018   ETH <5 87/86/7672    Metabolic Disorder Labs: Lab Results  Component Value Date   HGBA1C 8.9 (A) 03/27/2018   MPG 171.42 12/06/2017   MPG 237 11/02/2015   No results found for: PROLACTIN Lab Results  Component Value Date   CHOL 98 12/06/2017   TRIG 184 (H) 12/06/2017   HDL 41 12/06/2017   CHOLHDL 2.4 12/06/2017   VLDL 37 12/06/2017   LDLCALC 20 12/06/2017   LDLCALC 127 (H) 11/02/2015    Physical Findings: AIMS: Facial and Oral Movements Muscles of Facial Expression:  None, normal Lips and Perioral Area: None, normal Jaw: None, normal Tongue: None, normal,Extremity Movements Upper (arms, wrists, hands, fingers): None, normal Lower (legs, knees, ankles, toes): None, normal, Trunk Movements Neck, shoulders, hips: None, normal, Overall Severity Severity of abnormal movements (highest score from questions above): None, normal Incapacitation due to abnormal movements: None, normal Patient's awareness of abnormal movements (rate only patient's report): No Awareness, Dental Status Current problems with teeth and/or dentures?: Yes Does patient usually wear dentures?: Yes  CIWA:  CIWA-Ar Total: 6 COWS:  COWS Total Score: 2  Musculoskeletal: Strength & Muscle Tone: within normal limits Gait & Station: normal  Psychiatric Specialty Exam: Physical Exam  ROS  Blood pressure 124/60, pulse (!) 59, temperature 97.7 F (36.5 C), temperature source Oral, resp. rate 16, height 5\' 5"  (1.651 m), weight 63.5 kg.Body mass index is 23.3 kg/m.  General Appearance: Casual  Eye Contact:  good  Speech:  Clear and Coherent  Volume:  Normal  Mood:  Anxious and Dysphoric  Affect:  Constricted  Thought Process:  Coherent  Orientation:  Full (Time, Place, and Person)  Thought Content:  Logical  Suicidal Thoughts:  No  Homicidal Thoughts:  No  Memory:  Immediate;   Good  Judgement:  Good  Insight:  Good  Psychomotor Activity:  Normal  Concentration:  Concentration: Good  Recall:  Good  Fund of Knowledge:  Good  Language:  Good  Akathisia:  Negative  Handed:  Right  AIMS (if indicated):     Assets:  Physical Health  ADL's:  Intact  Cognition:  WNL  Sleep:  Number of Hours: 6.75     Treatment Plan Summary: Daily contact with patient to assess and evaluate symptoms and progress in treatment, Medication management and Plan Continue to monitor for withdrawal continue rehab based therapies cognitive therapies continue antidepressant therapy continue meds for  cravings if needed continue PRN medications and current precautions  Lisa Molino, MD 09/21/2018, 10:00 AM

## 2018-09-21 NOTE — Progress Notes (Signed)
Recreation Therapy Notes  Date: 12.27.19 Time: 1000 Location: 500 Hall Dayroom  Group Topic: Communication, Team Building, Problem Solving  Goal Area(s) Addresses:  Patient will effectively work with peer towards shared goal.  Patient will identify skill used to make activity successful.  Patient will identify how skills used during activity can be used to reach post d/c goals.   Intervention: STEM Activity   Activity: [employer]. In teams, patients were asked to build the tallest freestanding tower possible out of 15 pipe cleaners. Systematically resources were removed, for example patient ability to use both hands and patient ability to verbally communicate.    Education: Education officer, community, Dentist.   Education Outcome: Acknowledges education/In group clarification offered/Needs additional education.   Clinical Observations/Feedback: Pt did not attend group.    Victorino Sparrow, LRT/CTRS         Ria Comment, Sheamus Hasting A 09/21/2018 11:29 AM
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
## 2018-09-21 NOTE — Tx Team (Signed)
Interdisciplinary Treatment and Diagnostic Plan Update  09/21/2018 Time of Session: 9:00am Lisa Anderson MRN: [id number]  Principal Diagnosis: <principal problem not specified>  Secondary Diagnoses: Active Problems:   MDD (major depressive disorder), recurrent episode, severe ([facility])   Current Medications:  Current Facility-Administered Medications  Medication Dose Route Frequency Provider Last Rate Last Dose  . acetaminophen (TYLENOL) tablet 650 mg  650 mg Oral Q6H PRN Laverle Hobby, PA-C   650 mg at 09/20/18 1659  . alum & mag hydroxide-simeth (MAALOX/MYLANTA) 200-200-20 MG/5ML suspension 30 mL  30 mL Oral Q4H PRN Patriciaann Clan E, PA-C      . cloNIDine (CATAPRES) tablet 0.1 mg  0.1 mg Oral BH-qamhs Simon, Spencer E, PA-C   0.1 mg at 09/21/18 0745   Followed by  . [START ON 09/23/2018] cloNIDine (CATAPRES) tablet 0.1 mg  0.1 mg Oral QAC breakfast Patriciaann Clan E, PA-C      . clopidogrel (PLAVIX) tablet 75 mg  75 mg Oral Daily Johnn Hai, MD   75 mg at 09/21/18 0745  . dicyclomine (BENTYL) tablet 20 mg  20 mg Oral Q6H PRN Laverle Hobby, PA-C      . diltiazem (CARDIZEM CD) 24 hr capsule 240 mg  240 mg Oral Daily Johnn Hai, MD   240 mg at 09/21/18 0746  . famotidine (PEPCID) tablet 20 mg  20 mg Oral BID Johnn Hai, MD   20 mg at 09/21/18 0745  . gabapentin (NEURONTIN) tablet 600 mg  600 mg Oral BID Johnn Hai, MD   600 mg at 09/21/18 0745  . glipiZIDE (GLUCOTROL) tablet 10 mg  10 mg Oral QAC breakfast Johnn Hai, MD   10 mg at 09/21/18 5284  . hydrOXYzine (ATARAX/VISTARIL) tablet 25 mg  25 mg Oral TID PRN Patriciaann Clan E, PA-C   25 mg at 09/20/18 0754  . insulin aspart (novoLOG) injection 0-15 Units  0-15 Units Subcutaneous TID WC Johnn Hai, MD   8 Units at 09/21/18 [phone]  . loperamide (IMODIUM) capsule 2-4 mg  2-4 mg Oral PRN Patriciaann Clan E, PA-C      . magnesium hydroxide (MILK OF MAGNESIA) suspension 30 mL  30 mL Oral Daily PRN Laverle Hobby, PA-C      .  methocarbamol (ROBAXIN) tablet 500 mg  500 mg Oral Q8H PRN Laverle Hobby, PA-C      . mirtazapine (REMERON) tablet 15 mg  15 mg Oral QHS Johnn Hai, MD   15 mg at 09/20/18 2133  . naproxen (NAPROSYN) tablet 500 mg  500 mg Oral BID PRN Laverle Hobby, PA-C   500 mg at 09/20/18 2136  . ondansetron (ZOFRAN-ODT) disintegrating tablet 4 mg  4 mg Oral Q6H PRN Laverle Hobby, PA-C      . traZODone (DESYREL) tablet 50 mg  50 mg Oral QHS Patriciaann Clan E, PA-C       PTA Medications: Medications Prior to Admission  Medication Sig Dispense Refill Last Dose  . acidophilus (RISAQUAD) CAPS capsule Take 1 capsule by mouth daily.   Taking  . albuterol (PROVENTIL HFA;VENTOLIN HFA) 108 (90 Base) MCG/ACT inhaler Inhale 2 puffs into the lungs every 6 (six) hours as needed for wheezing or shortness of breath. Shortness of breath 1 Inhaler 3 09/04/2018  . amoxicillin (AMOXIL) 500 MG capsule Take 1 capsule (500 mg total) by mouth 3 (three) times daily. (Patient not taking: Reported on 09/18/2018) 30 capsule 0 Completed Course at Unknown time  . Blood Glucose Monitoring Suppl (CONTOUR  NEXT MONITOR) w/Device KIT 1 strip 3 (three) times daily by Does not apply route. E11.9 1 kit 0 Taking  . Cholecalciferol (VITAMIN D PO) Take 1-2 tablets by mouth daily.   09/04/2018  . clindamycin (CLEOCIN) 300 MG capsule Take 1 capsule (300 mg total) by mouth 3 (three) times daily. X 7 days (Patient not taking: Reported on 09/18/2018) 21 capsule 0 Completed Course at Unknown time  . clopidogrel (PLAVIX) 75 MG tablet Take 1 tablet (75 mg total) by mouth daily. 30 tablet 3 09/04/2018  . diltiazem (CARDIZEM CD) 360 MG 24 hr capsule Take 1 capsule (360 mg total) by mouth daily. 30 capsule 3 09/04/2018  . DULoxetine (CYMBALTA) 60 MG capsule Take 1 capsule (60 mg total) by mouth daily. For mood control (Patient not taking: Reported on 09/18/2018) 30 capsule 3 Not Taking at Unknown time  . escitalopram (LEXAPRO) 20 MG tablet Take 20 mg  by mouth daily.   09/04/2018  . gabapentin (NEURONTIN) 300 MG capsule Take 2 capsules (600 mg total) by mouth 2 (two) times daily. (Patient not taking: Reported on 09/18/2018) 120 capsule 3 Not Taking at Unknown time  . glipiZIDE (GLUCOTROL) 10 MG tablet Take 1 tablet (10 mg total) by mouth 2 (two) times daily before a meal. (Patient not taking: Reported on 09/18/2018) 60 tablet 3 Not Taking at Unknown time  . glucose blood (CONTOUR NEXT TEST) test strip Use as instructed 3 times daily. E11.9 100 each 12 Taking  . hydrALAZINE (APRESOLINE) 50 MG tablet Take 1 tablet (50 mg total) by mouth 2 (two) times daily. For High blood pressure 60 tablet 3 09/04/2018  . hydrochlorothiazide (MICROZIDE) 12.5 MG capsule Take 1 capsule (12.5 mg total) by mouth daily. 30 capsule 3 09/04/2018  . insulin aspart (NOVOLOG) 100 UNIT/ML injection As per sliding scale 1 vial 12 09/04/2018  . insulin glargine (LANTUS) 100 UNIT/ML injection Inject 0.2 mLs (20 Units total) into the skin at bedtime. 30 mL 3 Taking  . metFORMIN (GLUCOPHAGE) 1000 MG tablet Take 1 tablet (1,000 mg total) by mouth 2 (two) times daily with a meal. 60 tablet 3 09/04/2018  . nicotine (NICODERM CQ) 7 mg/24hr patch Place 1 patch (7 mg total) onto the skin daily. 28 patch 0 09/04/2018  . potassium chloride SA (KLOR-CON M20) 20 MEQ tablet Take 1 tablet (20 mEq total) by mouth daily. 30 tablet 3 09/04/2018  . ranitidine (ZANTAC) 150 MG tablet Take 1 tablet (150 mg total) by mouth 2 (two) times daily. (Patient not taking: Reported on 09/18/2018) 60 tablet 3 Not Taking at Unknown time  . rosuvastatin (CRESTOR) 40 MG tablet Take 1 tablet (40 mg total) by mouth daily. 30 tablet 3 09/04/2018  . traZODone (DESYREL) 50 MG tablet Take 1 tablet (50 mg total) by mouth at bedtime as needed for sleep. 30 tablet 3 09/04/2018    Patient Stressors: Health problems Marital or family conflict Medication change or noncompliance Substance abuse  Patient Strengths:  Ability for insight General fund of knowledge  Treatment Modalities: Medication Management, Group therapy, Case management,  1 to 1 session with clinician, Psychoeducation, Recreational therapy.   Physician Treatment Plan for Primary Diagnosis: <principal problem not specified> Long Term Goal(s): Improvement in symptoms so as ready for discharge Improvement in symptoms so as ready for discharge   Short Term Goals: Ability to verbalize feelings will improve Ability to disclose and discuss suicidal ideas  Medication Management: Evaluate patient's response, side effects, and tolerance of medication regimen.  Therapeutic Interventions:  1 to 1 sessions, Unit Group sessions and Medication administration.  Evaluation of Outcomes: Progressing  Physician Treatment Plan for Secondary Diagnosis: Active Problems:   MDD (major depressive disorder), recurrent episode, severe ([place])  Long Term Goal(s): Improvement in symptoms so as ready for discharge Improvement in symptoms so as ready for discharge   Short Term Goals: Ability to verbalize feelings will improve Ability to disclose and discuss suicidal ideas     Medication Management: Evaluate patient's response, side effects, and tolerance of medication regimen.  Therapeutic Interventions: 1 to 1 sessions, Unit Group sessions and Medication administration.  Evaluation of Outcomes: Progressing   RN Treatment Plan for Primary Diagnosis: <principal problem not specified> Long Term Goal(s): Knowledge of disease and therapeutic regimen to maintain health will improve  Short Term Goals: Ability to verbalize frustration and anger appropriately will improve, Ability to demonstrate self-control, Ability to participate in decision making will improve and Compliance with prescribed medications will improve  Medication Management: RN will administer medications as ordered by provider, will assess and evaluate patient's response and provide education to  patient for prescribed medication. RN will report any adverse and/or side effects to prescribing provider.  Therapeutic Interventions: 1 on 1 counseling sessions, Psychoeducation, Medication administration, Evaluate responses to treatment, Monitor vital signs and CBGs as ordered, Perform/monitor CIWA, COWS, AIMS and Fall Risk screenings as ordered, Perform wound care treatments as ordered.  Evaluation of Outcomes: Progressing   LCSW Treatment Plan for Primary Diagnosis: <principal problem not specified> Long Term Goal(s): Safe transition to appropriate next level of care at discharge, Engage patient in therapeutic group addressing interpersonal concerns.  Short Term Goals: Engage patient in aftercare planning with referrals and resources, Increase social support, Increase ability to appropriately verbalize feelings, Increase emotional regulation and Identify triggers associated with mental health/substance abuse issues  Therapeutic Interventions: Assess for all discharge needs, 1 to 1 time with Social worker, Explore available resources and support systems, Assess for adequacy in community support network, Educate family and significant other(s) on suicide prevention, Complete Psychosocial Assessment, Interpersonal group therapy.  Evaluation of Outcomes: Progressing   Progress in Treatment: Attending groups: No. Participating in groups: No. Taking medication as prescribed: Yes. Toleration medication: Yes. Family/Significant other contact made: Yes, individual(s) contacted:  one contact attempt with brother, will make a second contact attempt. Patient understands diagnosis: Yes. Discussing patient identified problems/goals with staff: Yes. Medical problems stabilized or resolved: Yes. Denies suicidal/homicidal ideation: Yes. Issues/concerns per patient self-inventory: Yes.  New problem(s) identified: Yes, Describe:  Requesting residential substance use treatment referral.   New Short  Term/Long Term Goal(s): detox, medication management for mood stabilization; elimination of SI thoughts; development of comprehensive mental wellness/sobriety plan.  Patient Goals:  "Rehab," wants inpatient substance use treatment. CSW to make referrals.   Discharge Plan or Barriers: Continuing to assess. [place] pamphlet, Mobile Crisis information, and AA/NA information provided to patient for additional community support and resources.   Reason for Continuation of Hospitalization: Anxiety Depression Medication stabilization  Estimated Length of Stay: 3 days  Attendees: Patient: 09/21/2018 10:00 AM  Physician: Dr.Farah 09/21/2018 10:00 AM  Nursing: Legrand Como, RN 09/21/2018 10:00 AM  RN Care Manager: 09/21/2018 10:00 AM  Social Worker: Stephanie Acre, [place] 09/21/2018 10:00 AM  Recreational Therapist:  09/21/2018 10:00 AM  Other:  09/21/2018 10:00 AM  Other:  09/21/2018 10:00 AM  Other: 09/21/2018 10:00 AM    Scribe for Treatment Team: Joellen Jersey, [place] 09/21/2018 10:00 AM

## 2018-09-21 NOTE — Progress Notes (Addendum)
CSW met with patient at bedside to discuss discharge planning per patient's request. Patient's goal is to immediately discharge to residential substance use treatment. Patient was discharged from [facility] three weeks ago.   A [facility] referral has been submitted and is still pending review, CSW made patient aware that [facility] screening appointments are being scheduled for the second week of January and unfortunately it would not be possible for the patient to discharge from [facility] straight to [facility].  CSW reviewed other housing and treatment resources with patient: [facility] [employer], [facility] (shelter and [facility] [employer]). Printed handouts with contact information highlighted were provided for each facility and program. CSW encouraged patient to make phone calls to determine bed availability and complete phone screenings or interviews if applicable.  Regarding [facility] and [employer], patient states she is familiar with their work requirements or financial requirements. 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 Patient states she will call [facility] and ask if they are willing to work with her in regard to funding and deposits.  CSW inquired if patient would like an outpatient appointment scheduled prior to discharge, possibly with [facility] or [facility]. Patient declines. States if she is discharged without "somebody making a plan for me to go somewhere" she will go with police to jail. Patient states she will not "go to the streets" and declined other shelter resources.  CSW encouraged patient to follow up with the resources provided. CSW following for discharge needs.   Update: CSW followed up with patient. Patient states she called a couple of the [facility] in [place], but she doesn't think it will be a good fit with their upfront costs and work requirements. She  still has the printed list and the option to call back two of the [facility] on Saturday and Sunday for phone screenings.   Patient states her plan is to have a friend bring her car tomorrow, where the patient will drive to [facility] for an intake screening. CSW reviewed [facility] with patient as a back up. [facility] referral made but no appointment at this time.   Stephanie Acre, LCSW-A Clinical Social Worker

## 2018-09-21 NOTE — Plan of Care (Signed)
Progress note  D: pt found in bed; compliant with medication administration. Pt denies any physical pain, but does endorse withdrawal symptoms including agitation, anxiety and recurrent thoughts about relapsing again. Pt is worried about where she will go when she leaves and how she is going to get her car back from her drug dealers house once she leaves. Said drug dealer has the keys to the car. Pt denies any si/hi and verbally agrees to approach staff if these become apparent or before harming herself while at [facility]. Pt doesn't endorse ah/vh, but has been isolative to her room and ruminating on her drugs/alcohol.  A: pt provided support and encouragement. Pt given medication per protocol and standing orders. Q46m safety checks implemented and continued.  R: pt safe on the unit. Will continue to monitor.  Pt progressing in the following metrics  Problem: Activity: Goal: Sleeping patterns will improve Outcome: Progressing   Problem: Coping: Goal: Ability to verbalize frustrations and anger appropriately will improve Outcome: Progressing Goal: Ability to demonstrate self-control will improve Outcome: Progressing   Problem: Health Behavior/Discharge Planning: Goal: Identification of resources available to assist in meeting health care needs will improve Outcome: Progressing

## 2018-09-22 LAB — GLUCOSE, CAPILLARY
GLUCOSE-CAPILLARY: 200 mg/dL — AB (ref 70–99)
Glucose-Capillary: 202 mg/dL — ABNORMAL HIGH (ref 70–99)

## 2018-09-22 MED ORDER — CLONIDINE HCL 0.1 MG PO TABS: 0.1000 mg | ORAL_TABLET | ORAL | 11 refills | Status: DC

## 2018-09-22 MED ORDER — GABAPENTIN 600 MG PO TABS: 600.0000 mg | ORAL_TABLET | Freq: Two times a day (BID) | ORAL | 0 refills | Status: DC

## 2018-09-22 MED ORDER — TRAZODONE HCL 50 MG PO TABS: 50.0000 mg | ORAL_TABLET | Freq: Every day | ORAL | 0 refills | Status: DC

## 2018-09-22 MED ORDER — HYDROXYZINE HCL 25 MG PO TABS: 25.0000 mg | ORAL_TABLET | Freq: Three times a day (TID) | ORAL | 0 refills | Status: DC | PRN

## 2018-09-22 MED ORDER — METHOCARBAMOL 500 MG PO TABS: 500.0000 mg | ORAL_TABLET | Freq: Three times a day (TID) | ORAL | 0 refills | Status: DC | PRN

## 2018-09-22 MED ORDER — CLOPIDOGREL BISULFATE 75 MG PO TABS: 75.0000 mg | ORAL_TABLET | Freq: Every day | ORAL | 0 refills | Status: DC

## 2018-09-22 MED ORDER — FAMOTIDINE 20 MG PO TABS: 20.0000 mg | ORAL_TABLET | Freq: Two times a day (BID) | ORAL | 0 refills | Status: DC

## 2018-09-22 MED ORDER — MIRTAZAPINE 15 MG PO TABS: 15.0000 mg | ORAL_TABLET | Freq: Every day | ORAL | 0 refills | Status: DC

## 2018-09-22 MED ORDER — DILTIAZEM HCL ER COATED BEADS 240 MG PO CP24: 240.0000 mg | ORAL_CAPSULE | Freq: Every day | ORAL | 0 refills | Status: DC

## 2018-09-22 MED ORDER — NICOTINE 7 MG/24HR TD PT24
7.0000 mg | MEDICATED_PATCH | Freq: Every day | TRANSDERMAL | Status: DC
  Administered 2018-09-22: 7 mg via TRANSDERMAL
  Filled 2018-09-22 (×3): qty 1

## 2018-09-22 NOTE — Progress Notes (Addendum)
D. Pt presents with flat affect- brightens a little during interactions- observed sitting in the dayroom- minimal interaction with peers. Pt reports looking forward to being discharged today and states that this is "a new adventure". Pt writes on her self inventory, that her most important goal to work on today is "taking life one day at a time".Pt currently denies SI/HI and AV hallucinations. A. Labs and vitals monitored. Pt compliant with medications. Pt supported emotionally and encouraged to express concerns and ask questions.   R. Pt remains safe with 15 minute checks. Will continue POC.

## 2018-09-22 NOTE — Progress Notes (Signed)
Pt discharged to lobby.Friend here with patient's car to transport patient. Pt was stable and appreciative at that time. All papers, samples and prescriptions were given and valuables returned. Verbal understanding expressed. Denies SI/HI and A/VH. Pt given opportunity to express concerns and ask questions.

## 2018-09-22 NOTE — Progress Notes (Signed)
  [facility] Adult Case Management Discharge Plan :  Will you be returning to the same living situation after discharge:  No.  Going to daughter's home to get some money, then plans to stay with friends. At discharge, do you have transportation home?: Yes,  car being brought here Do you have the ability to pay for your medications: No.  Needs a supply of meds  Release of information consent forms completed and in the chart;  Patient's signature needed at discharge.  Patient to Follow up at: Follow-up Information    [facility] Follow up.   Why:  Referral made on 09/21/18.  Please call on Monday 09/24/18 and they will provide you with a date you can be admitted.  When you go for your assessment, please take at least 14 days of medicine with you. Contact information: [address] [place] [address] [phone]        [facility] Follow up.   Why:  Please go see your medical provider within 7 days of discharge, prior to going to rehab. Contact information: [place] [address] [phone]       [facility] Follow up.   Why:  You have been provided a brochure for the [facility] [place], and wanted to call them yourself rather than be referred. Contact information: 1-800-QUIT-NOW [phone])          Next level of care provider has access to [place] and Suicide Prevention discussed: No.  Pt refused and brother could not be contacted.     Has patient been referred to the [facility]?: Patient refused referral but accepted information  Patient has been referred for addiction treatment: Yes  Maretta Los, LCSW 09/22/2018, 11:32 AM

## 2018-09-22 NOTE — [facility] Suicide Risk Assessment (Addendum)
[facility] Discharge Suicide Risk Assessment   Principal Problem: MDD (major depressive disorder), recurrent episode, severe ([place]) Discharge Diagnoses: Principal Problem:   MDD (major depressive disorder), recurrent episode, severe ([place])   Total Time spent with patient: 30 minutes  Musculoskeletal: Strength & Muscle Tone: within normal limits Gait & Station: normal Patient leans: N/A  Psychiatric Specialty Exam: ROS denies headache, no chest pain, no shortness of breath, no vomiting , no fever or chills  Blood pressure (!) 145/60, pulse 64, temperature 97.9 F (36.6 C), temperature source Oral, resp. rate 18, height 5\' 5"  (1.651 m), weight 63.5 kg.Body mass index is 23.3 kg/m.  General Appearance: Well Groomed  Eye Contact::  Good  Speech:  Normal Rate409  Volume:  Normal  Mood:  reports improving mood and states she feels " a lot better"  Affect:  Appropriate and smiles at times appropriately   Thought Process:  Linear and Descriptions of Associations: Intact  Orientation:  Other:  fully alert and attentive  Thought Content:  no hallucinations, no delusions,not internally preoccupied   Suicidal Thoughts:  No denies suicidal or self injurious ideations, denies homicidal or violent ideations  Homicidal Thoughts:  No  Memory:  recetn and remote grossly intact   Judgement:  Other:  fair, improving   Insight:  improving   Psychomotor Activity:  Normal  Concentration:  Good  Recall:  Good  Fund of Knowledge:Good  Language: Good  Akathisia:  Negative  Handed:  Right  AIMS (if indicated):     Assets:  Communication Skills Desire for Improvement Resilience  Sleep:  Number of Hours: 6.75  Cognition: WNL  ADL's:  Intact   Mental Status Per Nursing Assessment::   On Admission:  Suicidal ideation indicated by patient, Suicide plan  Demographic Factors:  57, divorced, 5 adult children, lives with one of her daughters, employed   Loss Factors: Patient lives with daughter, states  daughter recently told her she had to leave the home  Historical Factors: History of depression, history of Cocaine Use Disorder, history of prior psychiatric admission for depression, suicidal ideations   Risk Reduction Factors:   Sense of responsibility to family, Living with another person, especially a relative and Positive coping skills or problem solving skills  Continued Clinical Symptoms:  Patient case reviewed with Dr. Jake Samples, attending physician and with treatment team. Report is that she is stable for discharge. Alert, attentive, well groomed, mood improved, states she is feeling better than on admission, affect appropriate, reactive, no thought disorder, no suicidal or self injurious ideations, no homicidal or violent ideations, no psychotic symptoms, future oriented, planning on returning to work next week. Denies medication side effects. We reviewed medication side effects, including potential risk of sedation .   Cognitive Features That Contribute To Risk:  No gross cognitive deficits noted upon discharge. Is alert , attentive, and oriented x 3     Suicide Risk:  Mild:  Suicidal ideation of limited frequency, intensity, duration, and specificity.  There are no identifiable plans, no associated intent, mild dysphoria and related symptoms, good self-control (both objective and subjective assessment), few other risk factors, and identifiable protective factors, including available and accessible social support.  Follow-up Information    [facility] Follow up.   Why:  Referral made on 09/21/18.  Please call on Monday 09/24/18 and they will provide you with a date you can be admitted.  When you go for your assessment, please take at least 14 days of medicine with you. Contact information:  [place] [address] [phone]        [facility] Follow up.   Why:  Please go see your medical provider within 7 days of  discharge, prior to going to rehab. Contact information: [place] [address] [phone]       [facility] Follow up.   Why:  You have been provided a brochure for the [facility] [place], and wanted to call them yourself rather than be referred. Contact information: 1-800-QUIT-NOW [phone])          Plan Of Care/Follow-up recommendations:  Activity:  as tolerated Diet:  heart healthy, diabetic diet Tests:  NA Other:  NA Patient expresses readiness for discharge and is leaving unit in good spirits  Plans to return home Plans to follow up as above, and plans to follow up at [facility] for medical treatment Jenne Campus, MD 09/22/2018, 12:53 PM

## 2018-09-22 NOTE — Discharge Summary (Addendum)
Physician Discharge Summary Note  Patient:  Lisa Anderson is an 62 y.o., female MRN:  [id number] DOB:  1956-08-27 Patient phone:  [phone] (home)  Patient address:   [address] [place] [address],  Total Time spent with patient: 30 minutes  Date of Admission:  09/19/2018 Date of Discharge: 09/22/2018  Reason for Admission:  Lisa Anderson is 62 years of age and she presented through the emergency department apparently her daughter had thrown her out of the home and she had nowhere to go she further states she has been binging on cocaine and has depressive symptoms and thoughts of harming herself she also reported recent auditory and visual hallucinations is unclear whether this is related to cocaine use or simply symptom exaggeration due to issues of secondary gain.  She is poorly cooperative with the interview process tends to repeat the same answer she gave in the emergency department stating she is wanting to harm herself by overdose and needs help with alcohol and drugs. She does not want to come to the exam room but wants her interview in the room.  She is flat and sluggish she is probably in the depressive phase post cocaine abuse - denies wanting to harm others Denies current auditory and visual hallucinations  Denies cravings/no involuntary movements/no withdrawal symptoms noted or discerned No history of seizures by report  Associated Signs/Symptoms: Depression Symptoms:  hypersomnia, (Hypo) Manic Symptoms:  Hallucinations, Anxiety Symptoms:  n/a Psychotic Symptoms:  Hallucinations: Auditory PTSD Symptoms: Had a traumatic exposure:  past abuse Total Time spent with patient: 32 m  Past Psychiatric History: past admit- remote  Principal Problem: MDD (major depressive disorder), recurrent episode, severe ([place]) Discharge Diagnoses: Active Problems:   MDD (major depressive disorder), recurrent episode, severe ([place])   Past Psychiatric History: See HPI  Past  Medical History:  Past Medical History:  Diagnosis Date  . Asthma   . Cancer ([place])    breast  . Chronic congestive heart failure with left ventricular diastolic dysfunction ([place])   . Diabetes mellitus   . Hyperlipidemia   . Hypertension   . Stroke [facility]     Past Surgical History:  Procedure Laterality Date  . BREAST LUMPECTOMY    . EP IMPLANTABLE DEVICE N/A 08/07/2015   Procedure: Loop Recorder Insertion;  Surgeon: Thompson Grayer, MD;  Location: [facility];  Service: Cardiovascular;  Laterality: N/A;  . TEE WITHOUT CARDIOVERSION N/A 08/07/2015   Procedure: TRANSESOPHAGEAL ECHOCARDIOGRAM (TEE);  Surgeon: Sueanne Margarita, MD;  Location: [facility] ENDOSCOPY;  Service: Cardiovascular;  Laterality: N/A;  . TONSILLECTOMY     Family History:  Family History  Problem Relation Age of Onset  . Diabetes Mother   . Hyperlipidemia Mother   . Hypertension Mother   . Breast cancer Mother   . Rheum arthritis Father   . Asthma Father   . Stroke Father   . Thyroid disease Father   . Thyroid disease Brother   . Colon cancer Brother        in his 47s  . Breast cancer Sister    Family Psychiatric  History: denies Social History:  Social History   Substance and Sexual Activity  Alcohol Use Yes  . Alcohol/week: 6.0 standard drinks  . Types: 6 Cans of beer per week   Comment: occasionally     Social History   Substance and Sexual Activity  Drug Use Not Currently  . Types: "Crack" cocaine, Cocaine   Comment: last used a year ago  Social History   Socioeconomic History  . Marital status: Single    Spouse name: Not on file  . Number of children: 5  . Years of education: 55  . Highest education level: Not on file  Occupational History  . Occupation: CNA- [employer]  . Financial resource strain: Not on file  . Food insecurity:    Worry: Not on file    Inability: Not on file  . Transportation needs:    Medical: Not on file    Non-medical: Not on  file  Tobacco Use  . Smoking status: Current Every Day Smoker    Packs/day: 0.25    Types: Cigarettes  . Smokeless tobacco: Never Used  . Tobacco comment: pt smokes 8 per day  Substance and Sexual Activity  . Alcohol use: Yes    Alcohol/week: 6.0 standard drinks    Types: 6 Cans of beer per week    Comment: occasionally  . Drug use: Not Currently    Types: "Crack" cocaine, Cocaine    Comment: last used a year ago  . Sexual activity: Never    Comment: recovering cocaine addict x6 months  Lifestyle  . Physical activity:    Days per week: Not on file    Minutes per session: Not on file  . Stress: Not on file  Relationships  . Social connections:    Talks on phone: Not on file    Gets together: Not on file    Attends religious service: Not on file    Active member of club or organization: Not on file    Attends meetings of clubs or organizations: Not on file    Relationship status: Not on file  Other Topics Concern  . Not on file  Social History Narrative   Lives with daughter      Caffeine use: Drinks soda (2- 20 oz soda per day)    Hospital Course: Lisa Anderson was admitted for MDD (major depressive disorder), recurrent episode, severe ([place]) and crisis management.  SHe was treated with the following medications  Clonidine 0.20m po BID dor detox and withdrawal symptoms, gabapentin 6015mpo BID for pain, remeron 1548mo qhs for depression and insomnia, trazadone 64m61m qhs prn for insomnia.  PatrDeniz Anderson discharged with current medication and was instructed on how to take medications as prescribed; (details listed below under Medication List).  Medical problems were identified and treated as needed.  Home medications were restarted as appropriate. Patient with extensive cardiac history was resumed on all cardiac medications, some of which were adjusted to target BP goals and Heart rate goals. She was monitored daily by diabetic coordinator, in which insulin regimen was  adjusted daily. Labs obtained during this hospital stay are within normal, except potassium of 3.4 which was resumed while in the hospital. creatnine is elevated, WBC is 11.8 elevated and UDS positive for cocaine  Improvement was monitored by observation and Lisa Platerly report of symptom reduction.  Emotional and mental status was monitored by daily self-inventory reports completed by Lisa Anderson clinical staff.         Lisa Anderson evaluated by the treatment team for stability and plans for continued recovery upon discharge.  Lisa Circleivation was an integral factor for scheduling further treatment.  Employment, transportation, bed availability, health status, family support, and any pending legal issues were also considered during her hospital stay.  SHe was offered further treatment options upon discharge including but  not limited to Residential, Intensive Outpatient, and Outpatient treatment.  Lisa Anderson will follow up with the services as listed below under Follow Up Information.     Upon completion of this admission the Lisa Anderson was both mentally and medically stable for discharge denying suicidal/homicidal ideation, auditory/visual/tactile hallucinations, delusional thoughts and paranoia.     Physical Findings: AIMS: Facial and Oral Movements Muscles of Facial Expression: None, normal Lips and Perioral Area: None, normal Jaw: None, normal Tongue: None, normal,Extremity Movements Upper (arms, wrists, hands, fingers): None, normal Lower (legs, knees, ankles, toes): None, normal, Trunk Movements Neck, shoulders, hips: None, normal, Overall Severity Severity of abnormal movements (highest score from questions above): None, normal Incapacitation due to abnormal movements: None, normal Patient's awareness of abnormal movements (rate only patient's report): No Awareness, Dental Status Current problems with teeth and/or dentures?: Yes(partial uppers  ) Does patient usually wear dentures?: Yes  CIWA:  CIWA-Ar Total: 6 COWS:  COWS Total Score: 6  Musculoskeletal: Strength & Muscle Tone: within normal limits Gait & Station: normal Patient leans: N/A  Psychiatric Specialty Exam:See MD SRA Physical Exam  ROS  Blood pressure (!) 145/60, pulse 64, temperature 97.9 F (36.6 C), temperature source Oral, resp. rate 18, height _0  (1.651 m), weight 63.5 kg.Body mass index is 23.3 kg/m.  Sleep:  Number of Hours: 6.75        Has this patient used any form of tobacco in the last 30 days? (Cigarettes, Smokeless Tobacco, Cigars, and/or Pipes)  Yes, A prescription for an FDA-approved tobacco cessation medication was offered at discharge and the patient refused  Blood Alcohol level:  Lab Results  Component Value Date   [facility] <10 09/18/2018   ETH <5 70/96/2836    Metabolic Disorder Labs:  Lab Results  Component Value Date   HGBA1C 8.9 (A) 03/27/2018   MPG 171.42 12/06/2017   MPG 237 11/02/2015   No results found for: PROLACTIN Lab Results  Component Value Date   CHOL 98 12/06/2017   TRIG 184 (H) 12/06/2017   HDL 41 12/06/2017   CHOLHDL 2.4 12/06/2017   VLDL 37 12/06/2017   LDLCALC 20 12/06/2017   LDLCALC 127 (H) 11/02/2015    See Psychiatric Specialty Exam and Suicide Risk Assessment completed by Attending Physician prior to discharge.  Discharge destination:  Home  Is patient on multiple antipsychotic therapies at discharge:  No   Has Patient had three or more failed trials of antipsychotic monotherapy by history:  No  Recommended Plan for Multiple Antipsychotic Therapies: NA   Allergies as of 09/22/2018      Reactions   Prednisone Hives, Other (See Comments)   Made her "crazy", suicidal   Benicar [olmesartan] Other (See Comments)   Did not work at all for patient   Celexa [citalopram] Other (See Comments)   Made patient feel crazy, fluoxetine is fine   Effexor [venlafaxine] Other (See Comments)   Made patient  feel crazy   Shrimp [shellfish Allergy] Hives, Swelling      Medication List    STOP taking these medications   amoxicillin 500 MG capsule Commonly known as:  AMOXIL   clindamycin 300 MG capsule Commonly known as:  CLEOCIN   DULoxetine 60 MG capsule Commonly known as:  CYMBALTA   gabapentin 300 MG capsule Commonly known as:  NEURONTIN Replaced by:  gabapentin 600 MG tablet   LEXAPRO 20 MG tablet Generic drug:  escitalopram   ranitidine 150 MG tablet Commonly known as:  ZANTAC  TAKE these medications     Indication  acidophilus Caps capsule Take 1 capsule by mouth daily.  Indication:  probitoic-digestive health   albuterol 108 (90 Base) MCG/ACT inhaler Commonly known as:  PROVENTIL HFA;VENTOLIN HFA Inhale 2 puffs into the lungs every 6 (six) hours as needed for wheezing or shortness of breath. Shortness of breath  Indication:  Asthma   cloNIDine 0.1 MG tablet Commonly known as:  CATAPRES Take 1 tablet (0.1 mg total) by mouth 2 (two) times daily in the am and at bedtime..  Indication:  High Blood Pressure Disorder, Opiate Withdrawal   clopidogrel 75 MG tablet Commonly known as:  PLAVIX Take 1 tablet (75 mg total) by mouth daily. Start taking on:  September 23, 2018  Indication:  Carotid Artery Occlusion   CONTOUR NEXT MONITOR w/Device Kit 1 strip 3 (three) times daily by Does not apply route. E11.9  Indication:  diabetes   diltiazem 240 MG 24 hr capsule Commonly known as:  CARDIZEM CD Take 1 capsule (240 mg total) by mouth daily. Start taking on:  September 23, 2018 What changed:    medication strength  how much to take  Indication:  High Blood Pressure Disorder   famotidine 20 MG tablet Commonly known as:  PEPCID Take 1 tablet (20 mg total) by mouth 2 (two) times daily.  Indication:  Gastroesophageal Reflux Disease   gabapentin 600 MG tablet Commonly known as:  NEURONTIN Take 1 tablet (600 mg total) by mouth 2 (two) times daily. Replaces:   gabapentin 300 MG capsule  Indication:  Abuse or Misuse of Alcohol, Disease of the Peripheral Nerves   glipiZIDE 10 MG tablet Commonly known as:  GLUCOTROL Take 1 tablet (10 mg total) by mouth 2 (two) times daily before a meal.  Indication:  Type 2 Diabetes   glucose blood test strip Commonly known as:  CONTOUR NEXT TEST Use as instructed 3 times daily. E11.9  Indication:  diabetes   hydrALAZINE 50 MG tablet Commonly known as:  APRESOLINE Take 1 tablet (50 mg total) by mouth 2 (two) times daily. For High blood pressure  Indication:  High Blood Pressure Disorder   hydrochlorothiazide 12.5 MG capsule Commonly known as:  MICROZIDE Take 1 capsule (12.5 mg total) by mouth daily.  Indication:  High Blood Pressure Disorder   hydrOXYzine 25 MG tablet Commonly known as:  ATARAX/VISTARIL Take 1 tablet (25 mg total) by mouth 3 (three) times daily as needed for anxiety.  Indication:  Feeling Anxious, Tension   insulin aspart 100 UNIT/ML injection Commonly known as:  novoLOG As per sliding scale  Indication:  Type 2 Diabetes   insulin glargine 100 UNIT/ML injection Commonly known as:  LANTUS Inject 0.2 mLs (20 Units total) into the skin at bedtime.  Indication:  Type 2 Diabetes   metFORMIN 1000 MG tablet Commonly known as:  GLUCOPHAGE Take 1 tablet (1,000 mg total) by mouth 2 (two) times daily with a meal.  Indication:  Type 2 Diabetes   methocarbamol 500 MG tablet Commonly known as:  ROBAXIN Take 1 tablet (500 mg total) by mouth every 8 (eight) hours as needed for muscle spasms.  Indication:  Musculoskeletal Pain   mirtazapine 15 MG tablet Commonly known as:  REMERON Take 1 tablet (15 mg total) by mouth at bedtime.  Indication:  Major Depressive Disorder   nicotine 7 mg/24hr patch Commonly known as:  NICODERM CQ Place 1 patch (7 mg total) onto the skin daily.  Indication:  Nicotine Addiction  potassium chloride SA 20 MEQ tablet Commonly known as:  KLOR-CON M20 Take 1  tablet (20 mEq total) by mouth daily.  Indication:  Low Amount of Potassium in the Blood   rosuvastatin 40 MG tablet Commonly known as:  CRESTOR Take 1 tablet (40 mg total) by mouth daily.  Indication:  High Amount of Fats in the Blood   traZODone 50 MG tablet Commonly known as:  DESYREL Take 1 tablet (50 mg total) by mouth at bedtime. What changed:    when to take this  reasons to take this  Indication:  Trouble Sleeping   VITAMIN D PO Take 1-2 tablets by mouth daily.  Indication:  vitamin D defiency      Follow-up Information    Services, [facility] Follow up.   Why:  Referral made on 09/21/18. Contact information: [place] [address] [phone]           Follow-up recommendations:  Activity:  Increase actitivity as tolerated.  Diet:  Regular house diet at this time.  Tests:  Routine tests as suggested by outpatient psychiatrist Other:  Even if you begin to feel better continue taking your medications.    Signed: Suella Broad, FNP 09/22/2018, 10:26 AM   Patient seen, Suicide Assessment Completed.  Disposition Plan Reviewed

## 2018-09-22 NOTE — [facility] Group Notes (Signed)
  [facility] LCSW Group Therapy Note  Date/Time:  09/22/2018 11:15AM-12:00PM  Type of Therapy and Topic:  Group Therapy:  Feelings About Hospitalization  Participation Level:  Active   Description of Group This process group involved patients discussing their feelings related to being hospitalized, as well as the benefits they see to being in the hospital.  These feelings and benefits were itemized.  The group then brainstormed specific ways in which they could seek those same benefits when they discharge and return home.  Therapeutic Goals 1. Patient will identify and describe positive and negative feelings related to hospitalization 2. Patient will verbalize benefits of hospitalization to themselves personally 3. Patients will brainstorm together ways they can obtain similar benefits in the outpatient setting, identify barriers to wellness and possible solutions  Summary of Patient Progress:  The patient expressed her primary feelings about being hospitalized are fine, but she was excited because of being discharged today.  She stated that missing her medications is not something that she ever struggles with.  She reported that she will be returning to Narcotics Anonymous because it had "kept me sober for 6 months."  Her relapse happened, by her report, when she got upset with her sponsor so stopped going to meetings and almost immediately relapse.  Her focus for a large part of group was on her youngest son and how she wants to be sober for him before he gets out of jail when he is 22.    Therapeutic Modalities Cognitive Behavioral Therapy Motivational Interviewing    Selmer Dominion, LCSW 09/22/2018, 1:00 PM

## 2018-10-03 ENCOUNTER — Telehealth: Payer: Self-pay | Admitting: Family Medicine

## 2018-10-03 NOTE — Telephone Encounter (Signed)
1) Medication(s) Requested (by name): cardizem  *patient would like for a new prescription to be written for 120mg  twice a day because she says it comes out cheaper when purchasing from the pharmacy. 2) Pharmacy of Choice:  [facility] on [address]

## 2018-10-04 NOTE — Telephone Encounter (Signed)
Tried to contact patient to inform message but no answer and unable to LVM.

## 2018-10-04 NOTE — Telephone Encounter (Signed)
The patient will need to be seen in order for [place] to make a change like that.

## 2018-10-22 ENCOUNTER — Ambulatory Visit: Payer: BLUE CROSS/BLUE SHIELD | Admitting: Adult Health

## 2018-10-22 ENCOUNTER — Telehealth: Payer: Self-pay

## 2018-10-22 NOTE — Progress Notes (Deleted)
[facility]    Provider:  Dr Lisa Anderson Referring Provider: Charlott Rakes, MD Primary Care Physician:  Lisa Rakes, MD  CC:  stroke  Interval history 10/22/2018: Patient returns today for 101-monthfollow-up visit.  Patient has been doing well from a neurological standpoint without recent TIA/stroke symptoms.  She continues on Plavix alone with mild bruising but no bleeding.  Continues to take Crestor without side effects myalgias.  She continues to follow with her PCP for DM management.  Blood pressure today ***.  Loop recorder has not shown atrial fibrillation thus far.  She did have recent hospital admission on 09/19/2018 for suicidal ideation along with recurrent cocaine and alcohol use.  Medications adjusted along with community resources provided to assist with cocaine discontinuation.  Visit 04/18/18: Patient is being seen today for follow-up.  Since previous follow-up, patient did experience a TIA on 12/05/2017 after an episode of aphasia.  Patient did DAPT for 3 weeks and then continue Plavix alone.Was also recommended to follow-up with vascular surgery outpatient due to MRA showing intracranial stenosis including the right V4, right M3, right M1 as well as left subclavian artery stenosis.  Loop recorder has not shown atrial fibrillation thus far.  Patient is being seen today for follow-up appointment to receive clearance prior to dental procedure.  She states she has been good without additional TIA episodes.  She did complete 3 weeks of DAPT with aspirin and Plavix and currently on Plavix alone with mild bruising but no bleeding.  Continues to take Crestor without side effects of myalgias.  States she continues to have a difficult time controlling her diabetes as her glucose levels are "all over the place".  PCP is managing these for her.  Blood pressure mildly elevated at today's visit 150/82 but patient has not taken her morning medications yet and as she does monitor this  at home, she states is typically lower.  Patient states she has been clean of recreational drug use over one year now as she was at halfway house/sober living house where she is constantly monitored for any type of drug/alcohol use.  Patient needs to undergo dental procedure with a need to remove 3 teeth and they have been very painful radiating up into her ear and making it difficult to chew.  Loop recorder has not shown atrial fibrillation thus far.  Denies new or worsening stroke/TIA symptoms.   Visit 01/11/2017 AA: She is still on Plavix and ASA. She is still smoking. She had a crack cocaine relapse recently. She moved back here, she has a job now. She had a recent visit to the ED for headache in the setting of high blood pressure after she was taken off of HTN meds, she feels better with blood pressure management with Dr. ARayann Hemanafter being placed on lasix. The headaches are resolved.    HPI:  Lisa Thorpis a 63y.o. 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 Ms. [name] a 63y.o. female with history of hypertension, hyperlipidemia, asthma, diabetes mellitus, [facility] day history of diplopia, moderate to severe right-sided headache and gait instability. She did not receive IV t-PA due to delay in arrival.  Interval History 09/16/2015: She is getting better. Symptoms are improving. Headache is mostly gone but she still is having some pressure maybe due to sinus pain. Double vision resolved, walking is better, no weakness. No more wavy lines. She has  cut back on smoking and is trying to quit. No altered mentation or seizure activity. She goes to the cardiologist tomorrow to see about the loop implantor.She is on crestor. Taking ASA 325 every day. Blood pressure is a little high today, she endorses compliance with her medications. I offered to increase her meds, she says that she is seeing Dr.  Joylene Anderson tomorrow, she is going to speak with him about that as he manages her blood pressure.   Reviewed notes, labs and imaging from outside physicians, which showed:  IMAGING  Ct Head Wo Contrast 08/05/2015 1. Area of cortical low-attenuation the right parietal region, suspicious for evolving cortically based infarct. This could be confirmed with MRI of the brain with and without IV gadolinium. 2. Very mild chronic microvascular ischemic changes in the cerebral white matter, as above. 3. Paranasal sinus disease, as above, including a small air-fluid level in the right maxillary sinus, which could indicate acute sinusitis.   Ct Angio Neck W/cm &/or Wo/cm 08/06/2015 No significant carotid or vertebral artery stenosis in the neck.   Mr Lisa Anderson Head Wo Contrast 08/05/2015 1. Segmental occlusion or near complete occlusion of the proximal right M2 inferior division. 2. Mild right M1 stenosis. 3. Moderate distal right vertebral artery stenosis. 4. 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   Mr Brain Wo Contrast 08/05/2015 There is mild right parieto-occipital cortical cytotoxic edema related to the acute infarct. No intracranial hemorrhage, mass, midline shift, or extra-axial fluid collection is seen. Small foci of T2 hyperintensity in the subcortical and deep cerebral white matter bilaterally and in the pons are nonspecific but compatible with mild chronic small vessel ischemic disease. Orbits are unremarkable. There is mild right maxillary sinus mucosal thickening with small volume fluid/secretions. Polypoid mucosal thickening is noted in the left sphenoid and left maxillary sinuses, and there is mild bilateral ethmoid air cell mucosal thickening. Trace bilateral mastoid effusions are noted. Major intracranial vascular flow voids are preserved.   2D Echocardiogram  - Left ventricle: The cavity size was normal. Wall thickness wasnormal. Systolic function was normal. The estimated  ejectionfraction was in the range of 55% to 60%. Wall motion was normal;there were no regional wall motion abnormalities. Dopplerparameters are consistent with abnormal left ventricularrelaxation (grade 1 diastolic dysfunction). - Left atrium: The atrium was mildly dilated. - Atrial septum: No defect or patent foramen ovale was identified.  EEG - This is an abnormal electroencephalogram due to sharp activity noted in the left temporal region. This is suggestive of a focal disturbance with epileptogenic potential.  TEE No obvious source of embolism. Normal LV size and function Normal RV size and function Normal RA Normal LA and LA appendage Normal TV with trivial TR Normal PV Normal MV with trivial MR Normal trileaflet AV Normal interatrial septum with no evidence of shunt by colorflow dopper or agitated saline contrast Normal thoracic and ascending aorta.  Review of Systems: Patient complains of symptoms per HPI as well as the following symptoms: no CP, no SOB. Pertinent negatives per HPI. All others negative.   Social History   Socioeconomic History  . Marital status: Single    Spouse name: Not on file  . Number of children: 5  . Years of education: 50  . Highest education level: Not on file  Occupational History  . Occupation: CNA- [employer]  . Financial resource strain: Not on file  . Food insecurity:    Worry: Not on file    Inability:  Not on file  . Transportation needs:    Medical: Not on file    Non-medical: Not on file  Tobacco Use  . Smoking status: Current Every Day Smoker    Packs/day: 0.25    Types: Cigarettes  . Smokeless tobacco: Never Used  . Tobacco comment: pt smokes 8 per day  Substance and Sexual Activity  . Alcohol use: Yes    Alcohol/week: 6.0 standard drinks    Types: 6 Cans of beer per week    Comment: occasionally  . Drug use: Not Currently    Types: "Crack" cocaine, Cocaine    Comment: last used a  year ago  . Sexual activity: Never    Comment: recovering cocaine addict x6 months  Lifestyle  . Physical activity:    Days per week: Not on file    Minutes per session: Not on file  . Stress: Not on file  Relationships  . Social connections:    Talks on phone: Not on file    Gets together: Not on file    Attends religious service: Not on file    Active member of club or organization: Not on file    Attends meetings of clubs or organizations: Not on file    Relationship status: Not on file  . Intimate partner violence:    Fear of current or ex partner: Not on file    Emotionally abused: Not on file    Physically abused: Not on file    Forced sexual activity: Not on file  Other Topics Concern  . Not on file  Social History Narrative   Lives with daughter      Caffeine use: Drinks soda (2- 20 oz soda per day)    Family History  Problem Relation Age of Onset  . Diabetes Mother   . Hyperlipidemia Mother   . Hypertension Mother   . Breast cancer Mother   . Rheum arthritis Father   . Asthma Father   . Stroke Father   . Thyroid disease Father   . Thyroid disease Brother   . Colon cancer Brother        in his 54s  . Breast cancer Sister     Past Medical History:  Diagnosis Date  . Asthma   . Cancer ([place])    breast  . Chronic congestive heart failure with left ventricular diastolic dysfunction ([place])   . Diabetes mellitus   . Hyperlipidemia   . Hypertension   . Stroke [facility])     Past Surgical History:  Procedure Laterality Date  . BREAST LUMPECTOMY    . EP IMPLANTABLE DEVICE N/A 08/07/2015   Procedure: Loop Recorder Insertion;  Surgeon: Thompson Grayer, MD;  Location: [facility];  Service: Cardiovascular;  Laterality: N/A;  . TEE WITHOUT CARDIOVERSION N/A 08/07/2015   Procedure: TRANSESOPHAGEAL ECHOCARDIOGRAM (TEE);  Surgeon: Sueanne Margarita, MD;  Location: [facility];  Service: Cardiovascular;  Laterality: N/A;  . TONSILLECTOMY      Current Outpatient  Medications  Medication Sig Dispense Refill  . albuterol (PROVENTIL HFA;VENTOLIN HFA) 108 (90 Base) MCG/ACT inhaler Inhale 2 puffs into the lungs every 6 (six) hours as needed for wheezing or shortness of breath. Shortness of breath 1 Inhaler 3  . Blood Glucose Monitoring Suppl (CONTOUR NEXT MONITOR) w/Device KIT 1 strip 3 (three) times daily by Does not apply route. E11.9 1 kit 0  . Cholecalciferol (VITAMIN D PO) Take 1-2 tablets by mouth daily.    . cloNIDine (CATAPRES) 0.1  MG tablet Take 1 tablet (0.1 mg total) by mouth 2 (two) times daily in the am and at bedtime.. 60 tablet 11  . diltiazem (CARDIZEM CD) 240 MG 24 hr capsule Take 1 capsule (240 mg total) by mouth daily. 30 capsule 0  . gabapentin (NEURONTIN) 600 MG tablet Take 1 tablet (600 mg total) by mouth 2 (two) times daily. 60 tablet 0  . glucose blood (CONTOUR NEXT TEST) test strip Use as instructed 3 times daily. E11.9 100 each 12  . hydrOXYzine (ATARAX/VISTARIL) 25 MG tablet Take 1 tablet (25 mg total) by mouth 3 (three) times daily as needed for anxiety. 30 tablet 0  . metFORMIN (GLUCOPHAGE) 1000 MG tablet Take 1 tablet (1,000 mg total) by mouth 2 (two) times daily with a meal. 60 tablet 3  . mirtazapine (REMERON) 15 MG tablet Take 1 tablet (15 mg total) by mouth at bedtime. 30 tablet 0  . nicotine (NICODERM CQ) 7 mg/24hr patch Place 1 patch (7 mg total) onto the skin daily. 28 patch 0  . potassium chloride SA (KLOR-CON M20) 20 MEQ tablet Take 1 tablet (20 mEq total) by mouth daily. 30 tablet 3  . rosuvastatin (CRESTOR) 40 MG tablet Take 1 tablet (40 mg total) by mouth daily. 30 tablet 3  . traZODone (DESYREL) 50 MG tablet Take 1 tablet (50 mg total) by mouth at bedtime. 30 tablet 0   No current facility-administered medications for this visit.     Allergies as of 10/22/2018 - Review Complete 09/19/2018  Allergen Reaction Noted  . Prednisone Hives and Other (See Comments) 08/05/2015  . Benicar [olmesartan] Other (See Comments)  08/06/2015  . Celexa [citalopram] Other (See Comments) 08/06/2015  . Effexor [venlafaxine] Other (See Comments) 08/06/2015  . Shrimp [shellfish allergy] Hives and Swelling 10/08/2012    Vitals: There were no vitals taken for this visit. Last Weight:  Wt Readings from Last 1 Encounters:  09/13/18 140 lb (63.5 kg)   Last Height:   Ht Readings from Last 1 Encounters:  09/13/18 '5\' 5"'  (1.651 m)     ASSESSMENT/PLAN [name] is a 63 y.o. female with history of stroke and recent TIA on 11/2017.  Patient currently has loop recorder placed but has not shown atrial fibrillation thus far.  Vascular risk factors include uncontrolled DM, HTN and HLD.  Patient is being seen today for clearance of dental procedure.  Plan:  -Continue Plavix and Crestor for secondary stroke prevention -F/u with PCP regarding HLD, HTN and DM management -Advised to continue to stay active and eat healthy -avoid sugary drinks -Spoke to patient regarding preference of surgical procedure.  Typically, recommend 6 months post stroke/TIA event but due to teeth causing her pain making it difficult for her to eat, patient is cleared for dental procedure as it has been approximately 4 months since TIA.  Advised patient that there is a small but acceptable risk of recurrent stroke/TIA.  Recommend stop Plavix 3 to 5 days prior to procedure and once hemodynamically stable and able to swallow she can restart plavix.  -Consider removal of loop recorder after 07/2018 as at that point will be a total of 3 years since implanted.  Loop recorder has been causing patient discomfort as it is under her breast on left side and states it takes into her ribs.  Patient states she will contact Dr. Rayann Heman in the beginning of December to consider having this removed -Maintain strict control of hypertension with blood pressure goal below 130/90, diabetes with  hemoglobin A1c goal below 6.5% and cholesterol with LDL cholesterol (bad cholesterol)  goal below 70 mg/dL. I also advised the patient to eat a healthy diet with plenty of whole grains, cereals, fruits and vegetables, exercise regularly and maintain ideal body weight.  Follow-up in 6 months or call earlier if needed     Venancio Poisson, [facility]  [facility] [address] [place], [place] [address]  Phone [phone] Fax [phone] Note: This document was prepared with digital dictation and possible smart phrase technology. Any transcriptional errors that result from this process are unintentional.

## 2018-10-22 NOTE — Telephone Encounter (Signed)
Patient was a no call/no show for their appointment today.   

## 2018-10-23 ENCOUNTER — Encounter: Payer: Self-pay | Admitting: Adult Health

## 2018-12-24 ENCOUNTER — Other Ambulatory Visit: Payer: Self-pay | Admitting: Family Medicine

## 2018-12-24 DIAGNOSIS — E1151 Type 2 diabetes mellitus with 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 gangrene: Secondary | ICD-10-CM

## 2018-12-24 DIAGNOSIS — Z794 Long term (current) use of insulin: Principal | ICD-10-CM

## 2018-12-27 ENCOUNTER — Other Ambulatory Visit: Payer: Self-pay | Admitting: Family Medicine

## 2018-12-27 DIAGNOSIS — E1151 Type 2 diabetes mellitus with 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 gangrene: Secondary | ICD-10-CM

## 2018-12-27 DIAGNOSIS — I10 Essential (primary) hypertension: Secondary | ICD-10-CM

## 2018-12-27 DIAGNOSIS — Z8673 Personal history of transient ischemic attack (TIA), and cerebral infarction without residual deficits: Secondary | ICD-10-CM

## 2018-12-27 DIAGNOSIS — Z794 Long term (current) use of insulin: Secondary | ICD-10-CM

## 2019-01-11 ENCOUNTER — Telehealth: Payer: Self-pay | Admitting: Family Medicine

## 2019-01-11 NOTE — Telephone Encounter (Signed)
Pt came in to drop off paper work that needed to be filled out by her DR please follow up once completed

## 2019-01-14 NOTE — Telephone Encounter (Signed)
Paperwork has been received and patient will be contacted once paperwork is ready.

## 2019-01-16 ENCOUNTER — Other Ambulatory Visit: Payer: Self-pay

## 2019-01-16 ENCOUNTER — Ambulatory Visit: Payer: Self-pay | Attending: Family Medicine | Admitting: Physician Assistant

## 2019-01-16 VITALS — BP 167/81 | HR 78 | Temp 98.1°F | Resp 16 | Wt 165.0 lb

## 2019-01-16 DIAGNOSIS — E7800 Pure hypercholesterolemia, unspecified: Secondary | ICD-10-CM

## 2019-01-16 DIAGNOSIS — I5032 Chronic diastolic (congestive) heart failure: Secondary | ICD-10-CM

## 2019-01-16 DIAGNOSIS — E1151 Type 2 diabetes mellitus with 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 gangrene: Secondary | ICD-10-CM

## 2019-01-16 DIAGNOSIS — Z794 Long term (current) use of insulin: Secondary | ICD-10-CM

## 2019-01-16 DIAGNOSIS — Z8673 Personal history of transient ischemic attack (TIA), and cerebral infarction without residual deficits: Secondary | ICD-10-CM

## 2019-01-16 DIAGNOSIS — I10 Essential (primary) hypertension: Secondary | ICD-10-CM

## 2019-01-16 DIAGNOSIS — J4520 Mild intermittent asthma, uncomplicated: Secondary | ICD-10-CM

## 2019-01-16 LAB — POCT GLYCOSYLATED HEMOGLOBIN (HGB A1C): HbA1c, POC (controlled diabetic range): 7.1 % — AB (ref 0.0–7.0)

## 2019-01-16 LAB — GLUCOSE, POCT (MANUAL RESULT ENTRY): POC Glucose: 154 mg/dl — AB (ref 70–99)

## 2019-01-16 MED ORDER — HYDROCHLOROTHIAZIDE 12.5 MG PO TABS: 12.5000 mg | ORAL_TABLET | Freq: Every day | ORAL | 1 refills | Status: DC

## 2019-01-16 MED ORDER — ALBUTEROL SULFATE HFA 108 (90 BASE) MCG/ACT IN AERS: 2.0000 | INHALATION_SPRAY | Freq: Four times a day (QID) | RESPIRATORY_TRACT | 3 refills | Status: DC | PRN

## 2019-01-16 MED ORDER — ROSUVASTATIN CALCIUM 40 MG PO TABS: 40.0000 mg | ORAL_TABLET | Freq: Every day | ORAL | 1 refills | Status: DC

## 2019-01-16 MED ORDER — POTASSIUM CHLORIDE CRYS ER 20 MEQ PO TBCR: 20.0000 meq | EXTENDED_RELEASE_TABLET | Freq: Every day | ORAL | 1 refills | Status: DC

## 2019-01-16 MED ORDER — CLOPIDOGREL BISULFATE 75 MG PO TABS: 75.0000 mg | ORAL_TABLET | Freq: Every day | ORAL | 1 refills | Status: DC

## 2019-01-16 MED ORDER — HYDRALAZINE HCL 50 MG PO TABS: 50.0000 mg | ORAL_TABLET | Freq: Two times a day (BID) | ORAL | 4 refills | Status: DC

## 2019-01-16 MED ORDER — INSULIN GLARGINE 100 UNIT/ML ~~LOC~~ SOLN: 13.0000 [IU] | Freq: Every day | SUBCUTANEOUS | 4 refills | Status: DC

## 2019-01-16 MED ORDER — GLIPIZIDE 10 MG PO TABS: 10.0000 mg | ORAL_TABLET | Freq: Two times a day (BID) | ORAL | 1 refills | Status: DC

## 2019-01-16 MED ORDER — INSULIN ASPART 100 UNIT/ML ~~LOC~~ SOLN: SUBCUTANEOUS | 3 refills | Status: DC

## 2019-01-16 MED ORDER — CLONIDINE HCL 0.1 MG PO TABS: 0.1000 mg | ORAL_TABLET | ORAL | 11 refills | Status: DC

## 2019-01-16 MED ORDER — DILTIAZEM HCL ER COATED BEADS 240 MG PO CP24: 240.0000 mg | ORAL_CAPSULE | Freq: Every day | ORAL | 1 refills | Status: DC

## 2019-01-16 NOTE — Progress Notes (Signed)
Patient ID: Lisa Anderson, female   DOB: 1956/06/05, 63 y.o.   MRN: [id number]   Lisa Anderson, is a 63 y.o. female  [id number]:[id number]  [id number]  DOB - [birth year]  Subjective:  Chief Complaint and HPI: Lisa Anderson is a 63 y.o. female here today to re-establish care.  She went to [facility] and is now living involved in [employer] in [place]. 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  She is being [facility].  She is doing well. She was off her meds for a while but is now back on all of them.  She is now compliant with all meds.  She had a stroke about 5-6 yrs ago.  Now clean and sober since January.   ROS:   Constitutional:  No f/c, No night sweats, No unexplained weight loss. EENT:  No vision changes, No blurry vision, No hearing changes. No mouth, throat, or ear problems.  Respiratory: No cough, No SOB Cardiac: No CP, no palpitations GI:  No abd pain, No N/V/D. GU: No Urinary s/sx Musculoskeletal: No joint pain Neuro: No headache, no dizziness, no motor weakness.  Skin: No rash Endocrine:  No polydipsia. No polyuria.  Psych: Denies SI/HI  No problems updated.  ALLERGIES: Allergies  Allergen Reactions  . Prednisone Hives and Other (See Comments)    Made her "crazy", suicidal  . Benicar [Olmesartan] Other (See Comments)    Did not work at all for patient  . Celexa [Citalopram] Other (See Comments)    Made patient feel crazy, fluoxetine is fine  . Effexor [Venlafaxine] Other (See Comments)    Made patient feel crazy  . Shrimp [Shellfish Allergy] Hives and Swelling    PAST MEDICAL HISTORY: Past Medical History:  Diagnosis Date  . Asthma   . Cancer ([place])    breast  . Chronic congestive heart failure with left ventricular diastolic dysfunction ([place])   . Diabetes mellitus   . Hyperlipidemia   . Hypertension   . Stroke [facility])     MEDICATIONS AT HOME: Prior to Admission medications   Medication Sig Start Date End Date Taking? Authorizing Provider   albuterol (VENTOLIN HFA) 108 (90 Base) MCG/ACT inhaler Inhale 2 puffs into the lungs every 6 (six) hours as needed for wheezing or shortness of breath. Shortness of breath 01/16/19   Freeman Caldron M, PA-C  Blood Glucose Monitoring Suppl (CONTOUR NEXT MONITOR) w/Device KIT 1 strip 3 (three) times daily by Does not apply route. E11.9 08/07/17   Tresa Garter, MD  Cholecalciferol (VITAMIN D PO) Take 1-2 tablets by mouth daily.    [provider]  cloNIDine (CATAPRES) 0.1 MG tablet Take 1 tablet (0.1 mg total) by mouth 2 (two) times daily in the am and at bedtime.. 01/16/19   Argentina Donovan, PA-C  clopidogrel (PLAVIX) 75 MG tablet Take 1 tablet (75 mg total) by mouth daily. 01/16/19   Argentina Donovan, PA-C  diltiazem (CARDIZEM CD) 240 MG 24 hr capsule Take 1 capsule (240 mg total) by mouth daily. 01/16/19   Argentina Donovan, PA-C  gabapentin (NEURONTIN) 600 MG tablet Take 1 tablet (600 mg total) by mouth 2 (two) times daily. 09/22/18   Starkes-Perry, Gayland Curry, FNP  glipiZIDE (GLUCOTROL) 10 MG tablet Take 1 tablet (10 mg total) by mouth 2 (two) times daily. 01/16/19   Argentina Donovan, PA-C  glucose blood (CONTOUR NEXT TEST) test strip Use as instructed 3 times daily. E11.9 08/07/17   Tresa Garter, MD  hydrALAZINE (APRESOLINE) 50 MG tablet Take 1 tablet (50 mg total) by mouth 2 (two) times daily. 01/16/19   Argentina Donovan, PA-C  hydrochlorothiazide (HYDRODIURIL) 12.5 MG tablet Take 1 tablet (12.5 mg total) by mouth daily. 01/16/19   Argentina Donovan, PA-C  insulin aspart (NOVOLOG) 100 UNIT/ML injection Sliding scale 01/16/19   Freeman Caldron M, PA-C  insulin glargine (LANTUS) 100 UNIT/ML injection Inject 0.13 mLs (13 Units total) into the skin at bedtime. 01/16/19   Argentina Donovan, PA-C  nicotine (NICODERM CQ) 7 mg/24hr patch Place 1 patch (7 mg total) onto the skin daily. 12/20/17   Charlott Rakes, MD  potassium chloride SA (KLOR-CON M20) 20 MEQ tablet Take 1 tablet (20 mEq  total) by mouth daily. 01/16/19   Argentina Donovan, PA-C  rosuvastatin (CRESTOR) 40 MG tablet Take 1 tablet (40 mg total) by mouth daily. 01/16/19   Argentina Donovan, PA-C  traZODone (DESYREL) 50 MG tablet Take 1 tablet (50 mg total) by mouth at bedtime. 09/22/18   Suella Broad, FNP     Objective:  EXAM:   Vitals:   01/16/19 1029  BP: (!) 167/81  Pulse: 78  Resp: 16  Temp: 98.1 F (36.7 C)  TempSrc: Oral  SpO2: 98%  Weight: 165 lb (74.8 kg)    General appearance : A&OX3. NAD. Non-toxic-appearing HEENT: Atraumatic and Normocephalic.  PERRLA. EOM intact.   Chest/Lungs:  Breathing-non-labored, Good air entry bilaterally, breath sounds normal without rales, rhonchi, or wheezing  CVS: S1 S2 regular, no murmurs, gallops, rubs  Abdomen: Bowel sounds present, Non tender and not distended with no gaurding, rigidity or rebound. Extremities: Bilateral Lower Ext shows no edema, both legs are warm to touch with = pulse throughout Neurology:  CN II-XII grossly intact, Non focal.   Psych:  TP linear. J/I WNL. Normal speech. Appropriate eye contact and affect.  Skin:  No Rash  Data Review Lab Results  Component Value Date   HGBA1C 8.9 (A) 03/27/2018   HGBA1C 7.6 (H) 12/06/2017   HGBA1C 8.3 07/21/2017     Assessment & Plan   1. Type 2 diabetes mellitus with 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 gangrene, with long-term current use of insulin (HCC) suoptimal control.  follow DM diet. Increase lantus dose.  A1C =7.1 today - Glucose (CBG) - HgB A1c - glipiZIDE (GLUCOTROL) 10 MG tablet; Take 1 tablet (10 mg total) by mouth 2 (two) times daily.  Dispense: 180 tablet; Refill: 1 - insulin aspart (NOVOLOG) 100 UNIT/ML injection; Sliding scale  Dispense: 10 mL; Refill: 3 - insulin glargine (LANTUS) 100 UNIT/ML injection; Inject 0.13 mLs (13 Units total) into the skin at bedtime.  Dispense: 10 mL; Refill: 4 - Comprehensive metabolic panel - CBC with Differential/Platelet  2. Pure  hypercholesterolemia - rosuvastatin (CRESTOR) 40 MG tablet; Take 1 tablet (40 mg total) by mouth daily.  Dispense: 90 tablet; Refill: 1 - Lipid Panel  3. Chronic diastolic CHF (congestive heart failure) (HCC) stable - potassium chloride SA (KLOR-CON M20) 20 MEQ tablet; Take 1 tablet (20 mEq total) by mouth daily.  Dispense: 90 tablet; Refill: 1 - diltiazem (CARDIZEM CD) 240 MG 24 hr capsule; Take 1 capsule (240 mg total) by mouth daily.  Dispense: 90 capsule; Refill: 1  4. Mild intermittent asthma without complication - albuterol (VENTOLIN HFA) 108 (90 Base) MCG/ACT inhaler; Inhale 2 puffs into the lungs every 6 (six) hours as needed for wheezing or shortness of breath. Shortness of breath  Dispense: 1 Inhaler; Refill: 3  5. Essential hypertension Resume meds - cloNIDine (CATAPRES) 0.1 MG tablet; Take 1 tablet (0.1 mg total) by mouth 2 (two) times daily in the am and at bedtime..  Dispense: 60 tablet; Refill: 11 - hydrochlorothiazide (HYDRODIURIL) 12.5 MG tablet; Take 1 tablet (12.5 mg total) by mouth daily.  Dispense: 90 tablet; Refill: 1 - hydrALAZINE (APRESOLINE) 50 MG tablet; Take 1 tablet (50 mg total) by mouth 2 (two) times daily.  Dispense: 60 tablet; Refill: 4 - Comprehensive metabolic panel - CBC with Differential/Platelet  6. History of stroke - clopidogrel (PLAVIX) 75 MG tablet; Take 1 tablet (75 mg total) by mouth daily.  Dispense: 90 tablet; Refill: 1  Congratulated on sobriety.  Spent >30 mins face to face counseling on diabetic diet, sobriety.     Patient have been counseled extensively about nutrition and exercise  Return in about 3 months (around 04/17/2019) for Dr Margarita Rana;  DM and htn check.  The patient was given clear instructions to go to ER or return to medical center if symptoms don't improve, worsen or new problems develop. The patient verbalized understanding. The patient was told to call to get lab results if they haven't heard anything in the next week.      Freeman Caldron, PA-C [facility] [place], [place]   01/16/2019, 12:28 PM

## 2019-01-17 ENCOUNTER — Telehealth: Payer: Self-pay | Admitting: Family Medicine

## 2019-01-17 ENCOUNTER — Other Ambulatory Visit: Payer: Self-pay | Admitting: Pharmacist

## 2019-01-17 DIAGNOSIS — Z794 Long term (current) use of insulin: Principal | ICD-10-CM

## 2019-01-17 DIAGNOSIS — E1151 Type 2 diabetes mellitus with 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 gangrene: Secondary | ICD-10-CM

## 2019-01-17 LAB — LIPID PANEL
Chol/HDL Ratio: 4.4 ratio (ref 0.0–4.4)
Cholesterol, Total: 209 mg/dL — ABNORMAL HIGH (ref 100–199)
HDL: 48 mg/dL (ref >39)
LDL Calculated: 139 mg/dL — ABNORMAL HIGH (ref 0–99)
Triglycerides: 110 mg/dL (ref 0–149)
VLDL Cholesterol Cal: 22 mg/dL (ref 5–40)

## 2019-01-17 LAB — COMPREHENSIVE METABOLIC PANEL
ALT: 16 IU/L (ref 0–32)
AST: 16 IU/L (ref 0–40)
Albumin/Globulin Ratio: 1.5 (ref 1.2–2.2)
Albumin: 4 g/dL (ref 3.8–4.8)
Alkaline Phosphatase: 101 IU/L (ref 39–117)
BUN/Creatinine Ratio: 10 — ABNORMAL LOW (ref 12–28)
BUN: 12 mg/dL (ref 8–27)
Bilirubin Total: <0.2 mg/dL (ref 0.0–1.2)
CO2: 30 mmol/L — ABNORMAL HIGH (ref 20–29)
Calcium: 9.1 mg/dL (ref 8.7–10.3)
Chloride: 101 mmol/L (ref 96–106)
Creatinine, Ser: 1.19 mg/dL — ABNORMAL HIGH (ref 0.57–1.00)
GFR calc Af Amer: 57 mL/min/{1.73_m2} — ABNORMAL LOW (ref >59)
GFR calc non Af Amer: 49 mL/min/{1.73_m2} — ABNORMAL LOW (ref >59)
Globulin, Total: 2.6 g/dL (ref 1.5–4.5)
Glucose: 149 mg/dL — ABNORMAL HIGH (ref 65–99)
Potassium: 4.4 mmol/L (ref 3.5–5.2)
Sodium: 145 mmol/L — ABNORMAL HIGH (ref 134–144)
Total Protein: 6.6 g/dL (ref 6.0–8.5)

## 2019-01-17 LAB — CBC WITH DIFFERENTIAL/PLATELET
Basophils Absolute: 0.1 10*3/uL (ref 0.0–0.2)
Basos: 1 %
EOS (ABSOLUTE): 1 10*3/uL — ABNORMAL HIGH (ref 0.0–0.4)
Eos: 14 %
Hematocrit: 37.4 % (ref 34.0–46.6)
Hemoglobin: 12.1 g/dL (ref 11.1–15.9)
Immature Grans (Abs): 0 10*3/uL (ref 0.0–0.1)
Immature Granulocytes: 0 %
Lymphocytes Absolute: 2.1 10*3/uL (ref 0.7–3.1)
Lymphs: 27 %
MCH: 27 pg (ref 26.6–33.0)
MCHC: 32.4 g/dL (ref 31.5–35.7)
MCV: 84 fL (ref 79–97)
Monocytes Absolute: 0.5 10*3/uL (ref 0.1–0.9)
Monocytes: 7 %
Neutrophils Absolute: 3.9 10*3/uL (ref 1.4–7.0)
Neutrophils: 51 %
Platelets: 286 10*3/uL (ref 150–450)
RBC: 4.48 x10E6/uL (ref 3.77–5.28)
RDW: 13.4 % (ref 11.7–15.4)
WBC: 7.6 10*3/uL (ref 3.4–10.8)

## 2019-01-17 MED ORDER — INSULIN ASPART 100 UNIT/ML ~~LOC~~ SOLN: SUBCUTANEOUS | 3 refills | Status: DC

## 2019-01-17 NOTE — Telephone Encounter (Signed)
Called in regards to her results please follow up

## 2019-01-17 NOTE — Telephone Encounter (Signed)
At 11:04- Writer was able to connect with the patient. Phone numbers was updated. Pt name and DOB verified. Patient aware of results and result note per Freeman Caldron, PA-C. _______________________________________________________  Notes recorded by Carilyn Goodpasture, RN on 01/17/2019 at 10:56 AM EDT Unable to reach. Call placed and attempt to a leave a message for patient but call was disconnected after being told she was not there. Left VM on [phone] for patient to return call. ------  Notes recorded by Argentina Donovan, PA-C on 01/17/2019 at 8:31 AM EDT Please call patient. Her kidney function and cholesterol are a little abnormal. Continue all medications and work on [employer] as we discussed. Drink water(vs other drinks) for better kidney health. All of these numbers should improve with better diet and adherence to medication regimen. Follow-up in 3 months as planned. Sooner if needed. Thanks, Freeman Caldron, PA-C

## 2019-01-22 ENCOUNTER — Telehealth: Payer: Self-pay | Admitting: Family Medicine

## 2019-01-22 NOTE — Telephone Encounter (Signed)
New Message   Pt states she is needing insulin, she has plenty of Lantus but is out of novolog but the Novolog is now $400 and she would like just regular insulin to be sent to her pharmacy. [facility] on [address] in [place]. Please f/u

## 2019-01-23 NOTE — Telephone Encounter (Signed)
Hey Which insulin can she change to that will be more cost effective.

## 2019-01-24 NOTE — Telephone Encounter (Signed)
Humulin is offered in a generic at [facility] but the patient can also reach out to our pharmacy, she may qualify for PASS.

## 2019-01-25 NOTE — Telephone Encounter (Signed)
I will pass this information onto kelly to start the process.

## 2019-03-07 ENCOUNTER — Ambulatory Visit: Payer: Self-pay | Attending: Family Medicine | Admitting: Physician Assistant

## 2019-03-07 ENCOUNTER — Other Ambulatory Visit: Payer: Self-pay

## 2019-03-07 NOTE — Progress Notes (Signed)
Patient ID: Lisa Anderson, female   DOB: 1956-03-31, 63 y.o.   MRN: [id number] Virtual Visit via Telephone Note  I connected with Lisa Anderson on 03/07/19 at  3:30 PM EDT by telephone and verified that I am speaking with the correct person using two identifiers.   I discussed the limitations, risks, security and privacy concerns of performing an evaluation and management service by telephone and the availability of in person appointments. I also discussed with the patient that there may be a patient responsible charge related to this service. The patient expressed understanding and agreed to proceed.  Patient location: My Location:  [place] office Persons on the call:  History of Present Illness:    Observations/Objective:   Assessment and Plan:   Follow Up Instructions:    I discussed the assessment and treatment plan with the patient. The patient was provided an opportunity to ask questions and all were answered. The patient agreed with the plan and demonstrated an understanding of the instructions.   The patient was advised to call back or seek an in-person evaluation if the symptoms worsen or if the condition fails to improve as anticipated.  I provided *** minutes of non-face-to-face time during this encounter.   Freeman Caldron, PA-C

## 2019-03-14 MED FILL — !NOVOLOG 100UNITS/ML VIAL: 100/ML | 28 days supply | Qty: 10 | Fill #0

## 2019-03-14 MED FILL — $LANTUS 100 UNITS/ML VIAL: 100 | 84 days supply | Qty: 30 | Fill #0

## 2019-04-23 ENCOUNTER — Emergency Department (HOSPITAL_COMMUNITY): Payer: Self-pay

## 2019-04-23 ENCOUNTER — Emergency Department (HOSPITAL_COMMUNITY)
Admission: EM | Admit: 2019-04-23 | Discharge: 2019-04-23 | Disposition: A | Discharge status: Home / Self Care | Payer: Self-pay | Attending: Emergency Medicine | Admitting: Emergency Medicine

## 2019-04-23 DIAGNOSIS — J45909 Unspecified asthma, uncomplicated: Secondary | ICD-10-CM | POA: Insufficient documentation

## 2019-04-23 DIAGNOSIS — F17210 Nicotine dependence, cigarettes, uncomplicated: Secondary | ICD-10-CM | POA: Insufficient documentation

## 2019-04-23 DIAGNOSIS — Z79899 Other long term (current) drug therapy: Secondary | ICD-10-CM | POA: Insufficient documentation

## 2019-04-23 DIAGNOSIS — Z7902 Long term (current) use of antithrombotics/antiplatelets: Secondary | ICD-10-CM | POA: Insufficient documentation

## 2019-04-23 DIAGNOSIS — E119 Type 2 diabetes mellitus without complications: Secondary | ICD-10-CM | POA: Insufficient documentation

## 2019-04-23 DIAGNOSIS — R1084 Generalized abdominal pain: Secondary | ICD-10-CM | POA: Insufficient documentation

## 2019-04-23 DIAGNOSIS — Z794 Long term (current) use of insulin: Secondary | ICD-10-CM | POA: Insufficient documentation

## 2019-04-23 DIAGNOSIS — I5032 Chronic diastolic (congestive) heart failure: Secondary | ICD-10-CM | POA: Insufficient documentation

## 2019-04-23 DIAGNOSIS — I110 Hypertensive heart disease with heart failure: Secondary | ICD-10-CM | POA: Insufficient documentation

## 2019-04-23 DIAGNOSIS — Z8673 Personal history of transient ischemic attack (TIA), and cerebral infarction without residual deficits: Secondary | ICD-10-CM | POA: Insufficient documentation

## 2019-04-23 LAB — CBC
HCT: 40.2 % (ref 36.0–46.0)
Hemoglobin: 12.5 g/dL (ref 12.0–15.0)
MCH: 27.6 pg (ref 26.0–34.0)
MCHC: 31.1 g/dL (ref 30.0–36.0)
MCV: 88.7 fL (ref 80.0–100.0)
Platelets: 246 10*3/uL (ref 150–400)
RBC: 4.53 MIL/uL (ref 3.87–5.11)
RDW: 13.6 % (ref 11.5–15.5)
WBC: 9.4 10*3/uL (ref 4.0–10.5)
nRBC: 0 % (ref 0.0–0.2)

## 2019-04-23 LAB — URINALYSIS, ROUTINE W REFLEX MICROSCOPIC
Bacteria, UA: NONE SEEN
Bilirubin Urine: NEGATIVE
Glucose, UA: >=500 mg/dL — AB
Hgb urine dipstick: NEGATIVE
Ketones, ur: 5 mg/dL — AB
Leukocytes,Ua: NEGATIVE
Nitrite: NEGATIVE
Protein, ur: NEGATIVE mg/dL
Specific Gravity, Urine: 1.018 (ref 1.005–1.030)
pH: 7 (ref 5.0–8.0)

## 2019-04-23 LAB — COMPREHENSIVE METABOLIC PANEL
ALT: 16 U/L (ref 0–44)
AST: 17 U/L (ref 15–41)
Albumin: 3.8 g/dL (ref 3.5–5.0)
Alkaline Phosphatase: 95 U/L (ref 38–126)
Anion gap: 11 (ref 5–15)
BUN: 15 mg/dL (ref 8–23)
CO2: 24 mmol/L (ref 22–32)
Calcium: 8.7 mg/dL — ABNORMAL LOW (ref 8.9–10.3)
Chloride: 103 mmol/L (ref 98–111)
Creatinine, Ser: 1.28 mg/dL — ABNORMAL HIGH (ref 0.44–1.00)
GFR calc Af Amer: 52 mL/min — ABNORMAL LOW (ref >60)
GFR calc non Af Amer: 45 mL/min — ABNORMAL LOW (ref >60)
Glucose, Bld: 368 mg/dL — ABNORMAL HIGH (ref 70–99)
Potassium: 3.5 mmol/L (ref 3.5–5.1)
Sodium: 138 mmol/L (ref 135–145)
Total Bilirubin: 0.4 mg/dL (ref 0.3–1.2)
Total Protein: 7 g/dL (ref 6.5–8.1)

## 2019-04-23 LAB — LIPASE, BLOOD: Lipase: 48 U/L (ref 11–51)

## 2019-04-23 MED ORDER — ALUM & MAG HYDROXIDE-SIMETH 200-200-20 MG/5ML PO SUSP
30.0000 mL | Freq: Once | ORAL | Status: AC
  Administered 2019-04-23: 30 mL via ORAL
  Filled 2019-04-23: qty 30

## 2019-04-23 MED ORDER — LIDOCAINE VISCOUS HCL 2 % MT SOLN
15.0000 mL | Freq: Once | OROMUCOSAL | Status: AC
  Administered 2019-04-23: 15 mL via ORAL
  Filled 2019-04-23: qty 15

## 2019-04-23 MED ORDER — SODIUM CHLORIDE 0.9% FLUSH
3.0000 mL | Freq: Once | INTRAVENOUS | Status: AC
  Administered 2019-04-23: 3 mL via INTRAVENOUS

## 2019-04-23 MED ORDER — SODIUM CHLORIDE (PF) 0.9 % IJ SOLN
INTRAMUSCULAR | Status: AC
  Filled 2019-04-23: qty 50

## 2019-04-23 MED ORDER — IOHEXOL 300 MG/ML  SOLN
100.0000 mL | Freq: Once | INTRAMUSCULAR | Status: AC | PRN
  Administered 2019-04-23: 80 mL via INTRAVENOUS

## 2019-04-23 MED ORDER — SODIUM CHLORIDE 0.9 % IV BOLUS
1000.0000 mL | Freq: Once | INTRAVENOUS | Status: AC
  Administered 2019-04-23: 1000 mL via INTRAVENOUS

## 2019-04-23 NOTE — Discharge Instructions (Signed)
It was my pleasure taking care of you today!  Please call your primary care doctor today or tomorrow to schedule a follow-up appointment.  Return to the ER for new or worsening symptoms, any additional concerns.

## 2019-04-23 NOTE — ED Triage Notes (Signed)
Transported by [facility] from home-- onset of HA, body aches, and abdominal distention that started yesterday. Recently has had multiple concerns regarding constipation, last bowel movement occurred this morning.

## 2019-04-23 NOTE — ED Provider Notes (Signed)
[facility] DEPT Provider Note   CSN: [id number] Arrival date & time: 04/23/19  1245    History   Chief Complaint Chief Complaint  Patient presents with  . Abdominal Pain    HPI Lisa Anderson is a 63 y.o. female.     The history is provided by the patient and medical records. No language interpreter was used.  Abdominal Pain Associated symptoms: chills, constipation and nausea   Associated symptoms: no diarrhea, no fever and no vomiting    Lisa Anderson is a 63 y.o. female  with a PMH as listed below who presents to the Emergency Department complaining of intermittent abdominal pain, nausea and abdominal swelling x 1 year.  Denies vomiting.  She states that for the last year, she has to take a laxative in order to have a bowel movement.  She did take a laxative last night and had a bowel movement this morning.  No blood.  Has not had any loose stool.  She reports over the last week, she has just generally felt unwell.  Has had some chills, but no fever.  She has had some myalgias as well.  No cough or congestion.  No known sick contacts.  Past Medical History:  Diagnosis Date  . Asthma   . Cancer ([place])    breast  . Chronic congestive heart failure with left ventricular diastolic dysfunction ([place])   . Diabetes mellitus   . Hyperlipidemia   . Hypertension   . Stroke [facility])     Patient Active Problem List   Diagnosis Date Noted  . MDD (major depressive disorder), recurrent episode, severe ([place]) 09/19/2018  . Aphasia 12/06/2017  . Hypokalemia 12/06/2017  . Leukocytosis 12/06/2017  . Bipolar I disorder ([place]) 05/23/2017  . MDD (major depressive disorder), recurrent severe, without psychosis ([place]) 05/22/2017  . Major depressive disorder, recurrent episode, severe ([place]) 05/21/2017  . Cocaine use disorder, severe, dependence ([place]) 05/21/2017  . Suicidal intent 05/20/2017  . Drug overdose 05/19/2017  . Family history of colon cancer 02/23/2017   . Rectal bleeding 02/23/2017  . Gastroesophageal reflux disease without esophagitis 02/23/2017  . Intractable vomiting with nausea 02/23/2017  . Abnormal thyroid blood test 01/02/2017  . Insomnia 01/02/2017  . Anxiety 01/02/2017  . IDDM (insulin dependent diabetes mellitus) ([place])   . Asthma 11/12/2015  . Acute respiratory failure ([place]) 11/11/2015  . TIA (transient ischemic attack) 11/01/2015  . Chronic diastolic CHF (congestive heart failure) ([place]) 11/01/2015  . Acute focal neurological deficit 11/01/2015  . Type 2 diabetes mellitus with circulatory disorder ([place])   . HLD (hyperlipidemia)   . Palpitations   . History of stroke 08/05/2015  . Hypertension 08/05/2015  . Stroke due to embolism of right posterior cerebral artery ([place]) 08/05/2015  . Diabetes mellitus 08/05/2015  . Alcohol dependency ([place]) 08/10/2013  . Cocaine abuse ([place]) 08/08/2013  . Alcohol abuse 08/08/2013  . MDD (major depressive disorder) 08/08/2013    Past Surgical History:  Procedure Laterality Date  . BREAST LUMPECTOMY    . EP IMPLANTABLE DEVICE N/A 08/07/2015   Procedure: Loop Recorder Insertion;  Surgeon: Thompson Grayer, MD;  Location: [facility];  Service: Cardiovascular;  Laterality: N/A;  . TEE WITHOUT CARDIOVERSION N/A 08/07/2015   Procedure: TRANSESOPHAGEAL ECHOCARDIOGRAM (TEE);  Surgeon: Sueanne Margarita, MD;  Location: [facility] ENDOSCOPY;  Service: Cardiovascular;  Laterality: N/A;  . TONSILLECTOMY       OB History   No obstetric history on file.  Home Medications    Prior to Admission medications   Medication Sig Start Date End Date Taking? Authorizing Provider  albuterol (VENTOLIN HFA) 108 (90 Base) MCG/ACT inhaler Inhale 2 puffs into the lungs every 6 (six) hours as needed for wheezing or shortness of breath. Shortness of breath 01/16/19   Freeman Caldron M, PA-C  Blood Glucose Monitoring Suppl (CONTOUR NEXT MONITOR) w/Device KIT 1 strip 3 (three) times daily by Does not apply route.  E11.9 08/07/17   Tresa Garter, MD  Cholecalciferol (VITAMIN D PO) Take 1-2 tablets by mouth daily.    [provider]  cloNIDine (CATAPRES) 0.1 MG tablet Take 1 tablet (0.1 mg total) by mouth 2 (two) times daily in the am and at bedtime.. 01/16/19   Argentina Donovan, PA-C  clopidogrel (PLAVIX) 75 MG tablet Take 1 tablet (75 mg total) by mouth daily. 01/16/19   Argentina Donovan, PA-C  diltiazem (CARDIZEM CD) 240 MG 24 hr capsule Take 1 capsule (240 mg total) by mouth daily. 01/16/19   Argentina Donovan, PA-C  gabapentin (NEURONTIN) 600 MG tablet Take 1 tablet (600 mg total) by mouth 2 (two) times daily. 09/22/18   Starkes-Perry, Gayland Curry, FNP  glipiZIDE (GLUCOTROL) 10 MG tablet Take 1 tablet (10 mg total) by mouth 2 (two) times daily. 01/16/19   Argentina Donovan, PA-C  glucose blood (CONTOUR NEXT TEST) test strip Use as instructed 3 times daily. E11.9 08/07/17   Tresa Garter, MD  hydrALAZINE (APRESOLINE) 50 MG tablet Take 1 tablet (50 mg total) by mouth 2 (two) times daily. 01/16/19   Argentina Donovan, PA-C  hydrochlorothiazide (HYDRODIURIL) 12.5 MG tablet Take 1 tablet (12.5 mg total) by mouth daily. 01/16/19   Argentina Donovan, PA-C  insulin aspart (NOVOLOG) 100 UNIT/ML injection Sliding scale; max 9 units TID 01/17/19   Charlott Rakes, MD  insulin glargine (LANTUS) 100 UNIT/ML injection Inject 0.13 mLs (13 Units total) into the skin at bedtime. 01/16/19   Argentina Donovan, PA-C  nicotine (NICODERM CQ) 7 mg/24hr patch Place 1 patch (7 mg total) onto the skin daily. 12/20/17   Charlott Rakes, MD  potassium chloride SA (KLOR-CON M20) 20 MEQ tablet Take 1 tablet (20 mEq total) by mouth daily. 01/16/19   Argentina Donovan, PA-C  rosuvastatin (CRESTOR) 40 MG tablet Take 1 tablet (40 mg total) by mouth daily. 01/16/19   Argentina Donovan, PA-C  traZODone (DESYREL) 50 MG tablet Take 1 tablet (50 mg total) by mouth at bedtime. 09/22/18   Suella Broad, FNP    Family History  Family History  Problem Relation Age of Onset  . Diabetes Mother   . Hyperlipidemia Mother   . Hypertension Mother   . Breast cancer Mother   . Rheum arthritis Father   . Asthma Father   . Stroke Father   . Thyroid disease Father   . Thyroid disease Brother   . Colon cancer Brother        in his 95s  . Breast cancer Sister     Social History Social History   Tobacco Use  . Smoking status: Current Every Day Smoker    Packs/day: 0.25    Types: Cigarettes  . Smokeless tobacco: Never Used  . Tobacco comment: pt smokes 8 per day  Substance Use Topics  . Alcohol use: Yes    Alcohol/week: 6.0 standard drinks    Types: 6 Cans of beer per week    Comment: occasionally  . Drug  use: Not Currently    Types: "Crack" cocaine, Cocaine    Comment: last used a year ago     Allergies   Prednisone, Benicar [olmesartan], Celexa [citalopram], Effexor [venlafaxine], and Shrimp [shellfish allergy]   Review of Systems Review of Systems  Constitutional: Positive for chills. Negative for fever.  Gastrointestinal: Positive for abdominal distention, abdominal pain, constipation and nausea. Negative for diarrhea and vomiting.  Musculoskeletal: Positive for myalgias. Negative for arthralgias and neck pain.  Neurological: Positive for weakness. Negative for dizziness, syncope, numbness and headaches.  All other systems reviewed and are negative.    Physical Exam Updated Vital Signs BP (!) 184/72   Pulse 71   Temp 98.2 F (36.8 C) (Oral)   Resp 15   SpO2 99%   Physical Exam Vitals signs and nursing note reviewed.  Constitutional:      General: She is not in acute distress.    Appearance: She is well-developed.  HENT:     Head: Normocephalic and atraumatic.  Neck:     Musculoskeletal: Neck supple.  Cardiovascular:     Rate and Rhythm: Normal rate and regular rhythm.     Heart sounds: Normal heart sounds. No murmur.  Pulmonary:     Effort: Pulmonary effort is normal. No  respiratory distress.     Breath sounds: Normal breath sounds.  Abdominal:     General: There is no distension.     Palpations: Abdomen is soft.     Tenderness: There is no abdominal tenderness.     Comments: Generalized abdominal tenderness without focality.  No rebound or guarding.  No flank or CVA tenderness.  Skin:    General: Skin is warm and dry.  Neurological:     Mental Status: She is alert and oriented to person, place, and time.     Comments: Speech clear and goal oriented. CN 2-12 grossly intact.  Strength and sensation intact.      ED Treatments / Results  Labs (all labs ordered are listed, but only abnormal results are displayed) Labs Reviewed  COMPREHENSIVE METABOLIC PANEL - Abnormal; Notable for the following components:      Result Value   Glucose, Bld 368 (*)    Creatinine, Ser 1.28 (*)    Calcium 8.7 (*)    GFR calc non Af Amer 45 (*)    GFR calc Af Amer 52 (*)    All other components within normal limits  URINALYSIS, ROUTINE W REFLEX MICROSCOPIC - Abnormal; Notable for the following components:   Color, Urine STRAW (*)    Glucose, UA >=500 (*)    Ketones, ur 5 (*)    All other components within normal limits  LIPASE, BLOOD  CBC    EKG None  Radiology Ct Abdomen Pelvis W Contrast  Result Date: 04/23/2019 CLINICAL DATA:  Abdominal pain, constipation, abdominal distension EXAM: CT ABDOMEN AND PELVIS WITH CONTRAST TECHNIQUE: Multidetector CT imaging of the abdomen and pelvis was performed using the standard protocol following bolus administration of intravenous contrast. CONTRAST:  57m OMNIPAQUE IOHEXOL 300 MG/ML  SOLN COMPARISON:  None. FINDINGS: Lower chest: No acute abnormality. Hepatobiliary: No solid liver abnormality is seen. No gallstones, gallbladder wall thickening, or biliary dilatation. Pancreas: Unremarkable. No pancreatic ductal dilatation or surrounding inflammatory changes. Spleen: Normal in size without significant abnormality.  Adrenals/Urinary Tract: Adrenal glands are unremarkable. Kidneys are normal, without renal calculi, solid lesion, or hydronephrosis. Bladder is unremarkable. Stomach/Bowel: Stomach is within normal limits. Appendix appears normal. No evidence of bowel wall  thickening, distention, or inflammatory changes. Vascular/Lymphatic: Aortic atherosclerosis. No enlarged abdominal or pelvic lymph nodes. Reproductive: No mass or other significant abnormality. Other: No abdominal wall hernia or abnormality. No abdominopelvic ascites. Musculoskeletal: No acute or significant osseous findings. IMPRESSION: No CT findings of the abdomen or pelvis to explain abdominal pain, constipation, or abdominal distension. No large burden of stool in the colon. No evidence of bowel obstruction. Electronically Signed   By: Eddie Candle M.D.   On: 04/23/2019 14:17    Procedures Procedures (including critical care time)  Medications Ordered in ED Medications  sodium chloride flush (NS) 0.9 % injection 3 mL (3 mLs Intravenous Given 04/23/19 1302)  iohexol (OMNIPAQUE) 300 MG/ML solution 100 mL (80 mLs Intravenous Contrast Given 04/23/19 1337)  sodium chloride 0.9 % bolus 1,000 mL (1,000 mLs Intravenous New Bag/Given 04/23/19 1444)  alum & mag hydroxide-simeth (MAALOX/MYLANTA) 200-200-20 MG/5ML suspension 30 mL (30 mLs Oral Given 04/23/19 1518)    And  lidocaine (XYLOCAINE) 2 % viscous mouth solution 15 mL (15 mLs Oral Given 04/23/19 1518)     Initial Impression / Assessment and Plan / ED Course  I have reviewed the triage vital signs and the nursing notes.  Pertinent labs & imaging results that were available during my care of the patient were reviewed by me and considered in my medical decision making (see chart for details).       Lisa Anderson is a 63 y.o. female who presents to ED for generalized abdominal discomfort off and on for nearly a year now.  Over the last couple of days she felt much weaker than usual and overall  did not feel well.  On exam, she is afebrile, hemodynamically stable with generalized abdominal tenderness.  Labs reviewed and reassuring other than hyperglycemia.  No evidence of DKA.  Creatinine about baseline.  Normal white count.  CT without acute findings.  Feels improved after symptomatic management in the emergency department. Evaluation does not show pathology that would require ongoing emergent intervention or inpatient treatment.  Primary care follow-up encouraged.  Reasons to return to ER discussed and all questions answered.   Final Clinical Impressions(s) / ED Diagnoses   Final diagnoses:  Generalized abdominal pain    ED Discharge Orders    None       Ward, Ozella Almond, PA-C 04/23/19 1552    Fredia Sorrow, MD 04/25/19 [phone]

## 2019-05-29 MED FILL — $novoLOG 100 UNITS/ML VIAL: 100 | 28 days supply | Qty: 10 | Fill #1

## 2019-06-05 ENCOUNTER — Ambulatory Visit: Payer: Medicaid Other

## 2019-06-26 MED FILL — $novoLOG 100 UNITS/ML VIAL: 100 | 28 days supply | Qty: 10 | Fill #1

## 2019-07-03 ENCOUNTER — Ambulatory Visit: Payer: Medicaid Other

## 2019-07-16 ENCOUNTER — Ambulatory Visit: Payer: Medicaid Other | Admitting: Family Medicine

## 2019-09-20 ENCOUNTER — Inpatient Hospital Stay (HOSPITAL_COMMUNITY)
Admission: RE | Admit: 2019-09-20 | Discharge: 2019-09-25 | DRG: 881 | Disposition: A | Discharge status: Home / Self Care | Payer: Federal, State, Local not specified - Other | Attending: Psychiatry | Admitting: Psychiatry

## 2019-09-20 ENCOUNTER — Other Ambulatory Visit: Payer: Self-pay

## 2019-09-20 ENCOUNTER — Encounter (HOSPITAL_COMMUNITY): Payer: Self-pay | Admitting: Family

## 2019-09-20 DIAGNOSIS — Z915 Personal history of self-harm: Secondary | ICD-10-CM

## 2019-09-20 DIAGNOSIS — E785 Hyperlipidemia, unspecified: Secondary | ICD-10-CM | POA: Diagnosis present

## 2019-09-20 DIAGNOSIS — I5032 Chronic diastolic (congestive) heart failure: Secondary | ICD-10-CM

## 2019-09-20 DIAGNOSIS — Z823 Family history of stroke: Secondary | ICD-10-CM

## 2019-09-20 DIAGNOSIS — Z888 Allergy status to other drugs, medicaments and biological substances status: Secondary | ICD-10-CM

## 2019-09-20 DIAGNOSIS — F17210 Nicotine dependence, cigarettes, uncomplicated: Secondary | ICD-10-CM | POA: Diagnosis present

## 2019-09-20 DIAGNOSIS — I509 Heart failure, unspecified: Secondary | ICD-10-CM | POA: Diagnosis present

## 2019-09-20 DIAGNOSIS — Z20828 Contact with and (suspected) exposure to other viral communicable diseases: Secondary | ICD-10-CM | POA: Diagnosis present

## 2019-09-20 DIAGNOSIS — Z8349 Family history of other endocrine, nutritional and metabolic diseases: Secondary | ICD-10-CM

## 2019-09-20 DIAGNOSIS — F419 Anxiety disorder, unspecified: Secondary | ICD-10-CM | POA: Diagnosis present

## 2019-09-20 DIAGNOSIS — Z853 Personal history of malignant neoplasm of breast: Secondary | ICD-10-CM

## 2019-09-20 DIAGNOSIS — Z803 Family history of malignant neoplasm of breast: Secondary | ICD-10-CM

## 2019-09-20 DIAGNOSIS — Z825 Family history of asthma and other chronic lower respiratory diseases: Secondary | ICD-10-CM

## 2019-09-20 DIAGNOSIS — Z794 Long term (current) use of insulin: Secondary | ICD-10-CM

## 2019-09-20 DIAGNOSIS — F1424 Cocaine dependence with cocaine-induced mood disorder: Secondary | ICD-10-CM | POA: Diagnosis present

## 2019-09-20 DIAGNOSIS — F329 Major depressive disorder, single episode, unspecified: Principal | ICD-10-CM | POA: Diagnosis present

## 2019-09-20 DIAGNOSIS — Z833 Family history of diabetes mellitus: Secondary | ICD-10-CM

## 2019-09-20 DIAGNOSIS — E119 Type 2 diabetes mellitus without complications: Secondary | ICD-10-CM | POA: Diagnosis present

## 2019-09-20 DIAGNOSIS — R45851 Suicidal ideations: Secondary | ICD-10-CM | POA: Diagnosis present

## 2019-09-20 DIAGNOSIS — Z8249 Family history of ischemic heart disease and other diseases of the circulatory system: Secondary | ICD-10-CM

## 2019-09-20 DIAGNOSIS — I110 Hypertensive heart disease with heart failure: Secondary | ICD-10-CM | POA: Diagnosis present

## 2019-09-20 DIAGNOSIS — J45909 Unspecified asthma, uncomplicated: Secondary | ICD-10-CM | POA: Diagnosis present

## 2019-09-20 DIAGNOSIS — Z79899 Other long term (current) drug therapy: Secondary | ICD-10-CM

## 2019-09-20 DIAGNOSIS — Z590 Homelessness: Secondary | ICD-10-CM

## 2019-09-20 DIAGNOSIS — Z7902 Long term (current) use of antithrombotics/antiplatelets: Secondary | ICD-10-CM

## 2019-09-20 DIAGNOSIS — Z8673 Personal history of transient ischemic attack (TIA), and cerebral infarction without residual deficits: Secondary | ICD-10-CM

## 2019-09-20 DIAGNOSIS — G4700 Insomnia, unspecified: Secondary | ICD-10-CM | POA: Diagnosis present

## 2019-09-20 LAB — GLUCOSE, CAPILLARY
Glucose-Capillary: 277 mg/dL — ABNORMAL HIGH (ref 70–99)
Glucose-Capillary: 170 mg/dL — ABNORMAL HIGH (ref 70–99)

## 2019-09-20 LAB — RESPIRATORY PANEL BY RT PCR (FLU A&B, COVID)
Influenza A by PCR: NEGATIVE
Influenza B by PCR: NEGATIVE
SARS Coronavirus 2 by RT PCR: NEGATIVE

## 2019-09-20 MED ORDER — CLONIDINE HCL 0.1 MG PO TABS
0.1000 mg | ORAL_TABLET | Freq: Two times a day (BID) | ORAL | Status: DC
  Administered 2019-09-20 – 2019-09-25 (×10): 0.1 mg via ORAL
  Filled 2019-09-20 (×2): qty 1
  Filled 2019-09-20: qty 14
  Filled 2019-09-20 (×10): qty 1
  Filled 2019-09-20: qty 14

## 2019-09-20 MED ORDER — HYDROXYZINE HCL 25 MG PO TABS
25.0000 mg | ORAL_TABLET | Freq: Three times a day (TID) | ORAL | Status: DC | PRN
  Administered 2019-09-21 – 2019-09-23 (×3): 25 mg via ORAL
  Filled 2019-09-20: qty 1
  Filled 2019-09-20: qty 10
  Filled 2019-09-20 (×3): qty 1

## 2019-09-20 MED ORDER — CLOPIDOGREL BISULFATE 75 MG PO TABS
75.0000 mg | ORAL_TABLET | Freq: Every day | ORAL | Status: DC
  Administered 2019-09-20 – 2019-09-25 (×6): 75 mg via ORAL
  Filled 2019-09-20: qty 1
  Filled 2019-09-20: qty 7
  Filled 2019-09-20 (×9): qty 1

## 2019-09-20 MED ORDER — MAGNESIUM HYDROXIDE 400 MG/5ML PO SUSP: 30.0000 mL | Freq: Every day | ORAL | Status: DC | PRN

## 2019-09-20 MED ORDER — NICOTINE 7 MG/24HR TD PT24
7.0000 mg | MEDICATED_PATCH | Freq: Every day | TRANSDERMAL | Status: DC
  Administered 2019-09-22 – 2019-09-25 (×4): 7 mg via TRANSDERMAL
  Filled 2019-09-20 (×9): qty 1

## 2019-09-20 MED ORDER — HYDRALAZINE HCL 50 MG PO TABS
50.0000 mg | ORAL_TABLET | Freq: Two times a day (BID) | ORAL | Status: DC
  Administered 2019-09-20 – 2019-09-25 (×10): 50 mg via ORAL
  Filled 2019-09-20 (×3): qty 1
  Filled 2019-09-20: qty 14
  Filled 2019-09-20 (×5): qty 1
  Filled 2019-09-20: qty 2
  Filled 2019-09-20: qty 14
  Filled 2019-09-20 (×3): qty 1

## 2019-09-20 MED ORDER — TRAZODONE HCL 50 MG PO TABS
50.0000 mg | ORAL_TABLET | Freq: Every evening | ORAL | Status: DC | PRN
  Administered 2019-09-23 – 2019-09-24 (×2): 50 mg via ORAL
  Filled 2019-09-20 (×3): qty 1

## 2019-09-20 MED ORDER — ALUM & MAG HYDROXIDE-SIMETH 200-200-20 MG/5ML PO SUSP: 30.0000 mL | ORAL | Status: DC | PRN

## 2019-09-20 MED ORDER — HYDROCHLOROTHIAZIDE 12.5 MG PO CAPS
12.5000 mg | ORAL_CAPSULE | Freq: Every day | ORAL | Status: DC
  Administered 2019-09-20 – 2019-09-25 (×6): 12.5 mg via ORAL
  Filled 2019-09-20 (×9): qty 1

## 2019-09-20 MED ORDER — INSULIN ASPART 100 UNIT/ML ~~LOC~~ SOLN
0.0000 [IU] | Freq: Three times a day (TID) | SUBCUTANEOUS | Status: DC
  Administered 2019-09-20: 17:00:00 5 [IU] via SUBCUTANEOUS
  Administered 2019-09-21: 08:00:00 3 [IU] via SUBCUTANEOUS
  Administered 2019-09-21 (×2): 5 [IU] via SUBCUTANEOUS
  Administered 2019-09-22: 07:00:00 2 [IU] via SUBCUTANEOUS
  Administered 2019-09-22: 18:00:00 5 [IU] via SUBCUTANEOUS
  Administered 2019-09-22: 12:00:00 2 [IU] via SUBCUTANEOUS
  Administered 2019-09-23: 17:00:00 5 [IU] via SUBCUTANEOUS
  Administered 2019-09-23: 12:00:00 2 [IU] via SUBCUTANEOUS
  Administered 2019-09-24: 18:00:00 5 [IU] via SUBCUTANEOUS
  Administered 2019-09-24: 13:00:00 2 [IU] via SUBCUTANEOUS
  Administered 2019-09-25 (×2): 3 [IU] via SUBCUTANEOUS

## 2019-09-20 MED ORDER — GABAPENTIN 300 MG PO CAPS
300.0000 mg | ORAL_CAPSULE | Freq: Two times a day (BID) | ORAL | Status: DC
  Administered 2019-09-20 – 2019-09-21 (×2): 300 mg via ORAL
  Filled 2019-09-20 (×6): qty 1

## 2019-09-20 MED ORDER — INSULIN ASPART 100 UNIT/ML ~~LOC~~ SOLN
0.0000 [IU] | Freq: Every day | SUBCUTANEOUS | Status: DC
  Administered 2019-09-21: 22:00:00 3 [IU] via SUBCUTANEOUS
  Administered 2019-09-22: 2 [IU] via SUBCUTANEOUS
  Administered 2019-09-23: 22:00:00 3 [IU] via SUBCUTANEOUS
  Administered 2019-09-24: 22:00:00 4 [IU] via SUBCUTANEOUS

## 2019-09-20 MED ORDER — ALBUTEROL SULFATE HFA 108 (90 BASE) MCG/ACT IN AERS: 1.0000 | INHALATION_SPRAY | Freq: Four times a day (QID) | RESPIRATORY_TRACT | Status: DC | PRN

## 2019-09-20 MED ORDER — ACETAMINOPHEN 325 MG PO TABS
650.0000 mg | ORAL_TABLET | Freq: Four times a day (QID) | ORAL | Status: DC | PRN
  Administered 2019-09-22: 11:00:00 650 mg via ORAL
  Filled 2019-09-20: qty 2

## 2019-09-20 NOTE — H&P (Signed)
Behavioral Health Medical Screening Exam  Lisa Anderson is an 63 y.o. female. Present to [facility] as a walk-in with suicidal ideation. Reported worsening depression, hopelessness and plans to ended all.  Reported substance abuse history. Patient presented flat and tearful. Reported previous inpatient admission in 2017 for SI. Patient to be admitted to inpatient unit. Pending COViD testing.   Total Time spent with patient: 15 minutes  Psychiatric Specialty Exam: Physical Exam  Review of Systems  Blood pressure (!) 185/84, pulse 87, temperature 98.4 F (36.9 C), temperature source Oral, resp. rate 20, SpO2 100 %.There is no height or weight on file to calculate BMI.  General Appearance: Casual  Eye Contact:  Fair  Speech:  Clear and Coherent  Volume:  Normal  Mood:  Anxious and Depressed  Affect:  Congruent  Thought Process:  Coherent  Orientation:  Full (Time, Place, and Person)  Thought Content:  Logical  Suicidal Thoughts:  Yes.  with intent/plan  Homicidal Thoughts:  No  Memory:  Immediate;   Fair Recent;   Fair  Judgement:  Fair  Insight:  Fair  Psychomotor Activity:  Normal  Concentration: Concentration: Fair  Recall:  [employer] of Knowledge:Fair  Language: Fair  Akathisia:  No  Handed:  Right  AIMS (if indicated):     Assets:  Communication Skills Desire for Improvement Resilience Social Support  Sleep:       Musculoskeletal: Strength & Muscle Tone: within normal limits Gait & Station: normal Patient leans: N/A  Blood pressure (!) 185/84, pulse 87, temperature 98.4 F (36.9 C), temperature source Oral, resp. rate 20, SpO2 100 %.  Recommendations: Inpatient recommendation  Based on my evaluation the patient does not appear to have an emergency medical condition.  Derrill Center, NP 09/20/2019, 4:02 PM

## 2019-09-20 NOTE — Progress Notes (Addendum)
Patient ID: Lisa Anderson, female   DOB: 09-28-1955, 63 y.o.   MRN: [id number]   [facility] contacted to send COVID/FLU test kits to [facility].

## 2019-09-20 NOTE — Progress Notes (Signed)
Patient ID: Lisa Anderson, female   DOB: May 21, 1956, 63 y.o.   MRN: [id number] Pt A&O x 4, presents with SI, reports she wants to put herself out of her misery.  Pt admits to previous SI attempts, stating life is not getting any better.  Feelings of worthlessness, isolation.  Pt follows up at [facility].  Pt is depressed with sad affect.  Monitoring for safety.

## 2019-09-20 NOTE — BH Assessment (Addendum)
Assessment Note  Lisa Anderson is a 63 y.o. female who came to [facility] due to DI, increasing guilt and anxiety, feelings of worthlessness, wanting to be alone, and alcohol/drug w/d. Pt states, "I've been thinking about ending my life; it's not getting any better." Pt states she believes she has been to [facility] in 2018 and 2019. She states she wants to, "put myself out of my misery."  Pt confirms SI with a hx of attempting to kill herself 2-3 times, the last incident taking place approximately 3 years ago. Pt denies HI, AVH, NSSIB, access to guns/weapons, and engagement in the legal system.  Pt states she sees a provider at [facility], though she ran out of the medication she had been provided last week; she states she believes she takes Gabapentin 24m 2x/day and Lexapro 225m1x/day. Pt states that, when she takes it regularly, it typically works well. [facility]. Pt states she is also on 3 blood pressure medications that she has not taken since last week since she ran out of the prescriptions.  Pt declined to provide clinician verbal consent to contact anyone for collateral information.  Pt is oriented x4. Her recent and remote memory is intact. Pt was cooperative, though tearful, throughout the assessment process. Pt's insight, judgement, and impulse control is fair at this time.   Diagnosis: F33.2, Major depressive disorder, Recurrent episode, Severe   Past Medical History:  Past Medical History:  Diagnosis Date  . Asthma   . [place]  . 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 Diabetes mellitus   . Hyperlipidemia   . Hypertension   . [facility]    Past Surgical History:  Procedure Laterality Date  . BREAST LUMPECTOMY    . EP IMPLANTABLE DEVICE N/A 08/07/2015   Procedure: Loop Recorder Insertion;  Surgeon: JaThompson GrayerMD;  [facility];  Service:  Cardiovascular;  Laterality: N/A;  . TEE WITHOUT CARDIOVERSION N/A 08/07/2015   Procedure: TRANSESOPHAGEAL ECHOCARDIOGRAM (TEE);  Surgeon: TrSueanne MargaritaMD;  [facility];  Service: Cardiovascular;  Laterality: N/A;  . TONSILLECTOMY      Family History:  Family History  Problem Relation Age of Onset  . Diabetes Mother   . Hyperlipidemia Mother   . Hypertension Mother   . Breast cancer Mother   . Rheum arthritis Father   . Asthma Father   . Stroke Father   . Thyroid disease Father   . Thyroid disease Brother   . Colon cancer Brother        in his 5077s. Breast cancer Sister     Social History:  reports that she has been smoking cigarettes. She has been smoking about 0.25 packs per day. She has never used smokeless tobacco. She reports current alcohol use of about 6.0 standard drinks of alcohol per week. She reports previous drug use. Drugs: "Crack" cocaine and Cocaine.  Additional Social History:  Alcohol / Drug Use Pain Medications: Please see MAR Prescriptions: Please see MAR Over the Counter: Please see MAR History of alcohol / drug use?: Yes  CIWA: CIWA-Ar BP: (!) 185/84 Pulse Rate: 87 COWS:    Allergies:  Allergies  Allergen Reactions  . Prednisone Hives and Other (See Comments)    Made her "crazy", suicidal  . Benicar [Olmesartan] Other (See Comments)    Did not work at all for patient  .  Celexa [Citalopram] Other (See Comments)    Made patient feel crazy, fluoxetine is fine  . Effexor [Venlafaxine] Other (See Comments)    Made patient feel crazy  . Shrimp [Shellfish Allergy] Hives and Swelling    Home Medications:  Medications Prior to Admission  Medication Sig Dispense Refill  . albuterol (VENTOLIN HFA) 108 (90 Base) MCG/ACT inhaler Inhale 2 puffs into the lungs every 6 (six) hours as needed for wheezing or shortness of breath. Shortness of breath 1 Inhaler 3  . Blood Glucose Monitoring Suppl (CONTOUR NEXT MONITOR) w/Device KIT 1 strip 3  (three) times daily by Does not apply route. E11.9 1 kit 0  . Cholecalciferol (VITAMIN D PO) Take 1-2 tablets by mouth daily.    . cloNIDine (CATAPRES) 0.1 MG tablet Take 1 tablet (0.1 mg total) by mouth 2 (two) times daily in the am and at bedtime.. 60 tablet 11  . clopidogrel (PLAVIX) 75 MG tablet Take 1 tablet (75 mg total) by mouth daily. 90 tablet 1  . diltiazem (CARDIZEM CD) 240 MG 24 hr capsule Take 1 capsule (240 mg total) by mouth daily. 90 capsule 1  . gabapentin (NEURONTIN) 600 MG tablet Take 1 tablet (600 mg total) by mouth 2 (two) times daily. 60 tablet 0  . glipiZIDE (GLUCOTROL) 10 MG tablet Take 1 tablet (10 mg total) by mouth 2 (two) times daily. 180 tablet 1  . glucose blood (CONTOUR NEXT TEST) test strip Use as instructed 3 times daily. E11.9 100 each 12  . hydrALAZINE (APRESOLINE) 50 MG tablet Take 1 tablet (50 mg total) by mouth 2 (two) times daily. 60 tablet 4  . hydrochlorothiazide (HYDRODIURIL) 12.5 MG tablet Take 1 tablet (12.5 mg total) by mouth daily. 90 tablet 1  . insulin aspart (NOVOLOG) 100 UNIT/ML injection Sliding scale; max 9 units TID 10 mL 3  . insulin glargine (LANTUS) 100 UNIT/ML injection Inject 0.13 mLs (13 Units total) into the skin at bedtime. 10 mL 4  . nicotine (NICODERM CQ) 7 mg/24hr patch Place 1 patch (7 mg total) onto the skin daily. 28 patch 0  . potassium chloride SA (KLOR-CON M20) 20 MEQ tablet Take 1 tablet (20 mEq total) by mouth daily. 90 tablet 1  . rosuvastatin (CRESTOR) 40 MG tablet Take 1 tablet (40 mg total) by mouth daily. 90 tablet 1  . traZODone (DESYREL) 50 MG tablet Take 1 tablet (50 mg total) by mouth at bedtime. 30 tablet 0    OB/GYN Status:  No LMP recorded. Patient is postmenopausal.  General Assessment Data Location of Assessment: [facility] Assessment Services TTS Assessment: In system Is this a Tele or Face-to-Face Assessment?: Face-to-Face Is this an Initial Assessment or a Re-assessment for this encounter?: Initial  Assessment Patient Accompanied by:: N/A Language Other than English: No Living Arrangements: Homeless/Shelter What gender do you identify as?: Female Marital status: Divorced Porter name: UTA Pregnancy Status: No Living Arrangements: Alone Can pt return to current living arrangement?: Yes Admission Status: Voluntary Is patient capable of signing voluntary admission?: Yes Referral Source: Self/Family/Friend Insurance type: Administrator, arts Exam ([place]) Medical Exam completed: Yes  Crisis Care Plan Living Arrangements: Alone Legal Guardian: Other:(Self) Name of Psychiatrist: Beverly Sessions; has been seeing for over one year Name of Therapist: None  Education Status Is patient currently in school?: No Is the patient employed, unemployed or receiving disability?: (UTA)  Risk to self with the past 6 months Suicidal Ideation: Yes-Currently Present Has patient been a risk to self within  the past 6 months prior to admission? : (UTA) Suicidal Intent: Yes-Currently Present Has patient had any suicidal intent within the past 6 months prior to admission? : Yes Is patient at risk for suicide?: Yes Suicidal Plan?: No Has patient had any suicidal plan within the past 6 months prior to admission? : Other (comment)(UTA) Access to Means: (UTA) What has been your use of drugs/alcohol within the last 12 months?: Pt acknowledges SA Previous Attempts/Gestures: Yes How many times?: 3(2-3) Other Self Harm Risks: Pt has limited - no supports Triggers for Past Attempts: Family contact, Unpredictable Intentional Self Injurious Behavior: None Family Suicide History: Unable to assess Recent stressful life event(s): Conflict (Comment), Financial Problems(Pt has been using substances, conflict w/ daughter) Persecutory voices/beliefs?: No Depression: Yes Depression Symptoms: Despondent, Tearfulness, Guilt, Loss of interest in usual pleasures, Feeling worthless/self pity Substance abuse  history and/or treatment for substance abuse?: Yes Suicide prevention information given to non-admitted patients: Not applicable  Risk to Others within the past 6 months Homicidal Ideation: No Does patient have any lifetime risk of violence toward others beyond the six months prior to admission? : No Thoughts of Harm to Others: No Current Homicidal Intent: No Current Homicidal Plan: No Access to Homicidal Means: No Identified Victim: None noted History of harm to others?: No Assessment of Violence: None Noted Violent Behavior Description: None noted Does patient have access to weapons?: No(Pt denied access to guns/weapons) Criminal Charges Pending?: No Does patient have a court date: No Is patient on probation?: No  Psychosis Hallucinations: None noted Delusions: None noted  Mental Status Report Appearance/Hygiene: Unremarkable Eye Contact: Fair Motor Activity: Freedom of movement, Other (Comment)(Pt sat on a bench in the assessment room) Speech: Logical/coherent Level of Consciousness: Quiet/awake Mood: Depressed, Sullen Affect: Appropriate to circumstance, Sad, Depressed Anxiety Level: Minimal Thought Processes: Coherent, Relevant Judgement: Impaired Orientation: Person, Place, Time, Situation Obsessive Compulsive Thoughts/Behaviors: Minimal  Cognitive Functioning Concentration: Normal Memory: Recent Intact, Remote Intact Is patient IDD: No Insight: Fair Impulse Control: Poor Appetite: (UTA) Have you had any weight changes? : (UTA) Sleep: Unable to Assess Total Hours of Sleep: (UTA) Vegetative Symptoms: Unable to Assess  ADLScreening [facility] Assessment Services) Patient's cognitive ability adequate to safely complete daily activities?: (UTA) Patient able to express need for assistance with ADLs?: (UTA) Independently performs ADLs?: (UTA)  Prior Inpatient Therapy Prior Inpatient Therapy: Yes Prior Therapy Dates: Multiple - pt states 2018 and 2019 Prior Therapy  Facilty[facility]Provider[facility] Reason for Treatment: SA, depression  Prior Outpatient Therapy Prior Outpatient Therapy: No Does patient have an ACCT team?: No Does patient have Intensive In-House Services?  : No Does patient have [facility] services? : Yes Does patient have P4CC services?: No  ADL Screening (condition at time of admission) Patient's cognitive ability adequate to safely complete daily activities?: (UTA) Is the patient deaf or have difficulty hearing?: (UTA) Does the patient have difficulty seeing, even when wearing glasses/contacts?: (UTA) Does the patient have difficulty concentrating, remembering, or making decisions?: (UTA) Patient able to express need for assistance with ADLs?: (UTA) Does the patient have difficulty dressing or bathing?: (UTA) Independently performs ADLs?: (UTA) Does the patient have difficulty walking or climbing stairs?: (UTA) Weakness of Legs: (UTA) Weakness of Arms/Hands: (UTA)  Home Assistive Devices/Equipment Home Assistive Devices/Equipment: (UTA)  Therapy Consults (therapy consults require a physician order) PT Evaluation Needed: No OT Evalulation Needed: No SLP Evaluation Needed: No Abuse/Neglect Assessment (Assessment to be complete while patient is alone) Abuse/Neglect Assessment Can Be Completed: Unable to  assess, patient is non-responsive or altered mental status Values / Beliefs Cultural Requests During Hospitalization: (UTA) Spiritual Requests During Hospitalization: (UTA) [place] Needed: (UTA) Transition of Care Team Consult Needed: (UTA) Advance Directives (For Healthcare) Does Patient Have a Medical Advance Directive?: No Would patient like information on creating a medical advance directive?: No - Patient declined         Disposition: Ricky Ala, NP, reviewed pt's chart and information and met with pt and determined pt meets criteria for inpatient hospitalization. Pt is currently pending  at [facility] pending the results of her COVID test.   Disposition Initial Assessment Completed for this Encounter: Yes Disposition of Patient: Admit Type of inpatient treatment program: Adult Patient refused recommended treatment: No Mode of transportation if patient is discharged/movement?: N/A Patient referred to: Other (Comment)(Pt has been accepted to [place] pending COVID)  On Site Evaluation by:   Reviewed with Physician:    Dannielle Burn 09/20/2019 4:02 PM

## 2019-09-21 DIAGNOSIS — Z8349 Family history of other endocrine, nutritional and metabolic diseases: Secondary | ICD-10-CM | POA: Diagnosis not present

## 2019-09-21 DIAGNOSIS — Z853 Personal history of malignant neoplasm of breast: Secondary | ICD-10-CM | POA: Diagnosis not present

## 2019-09-21 DIAGNOSIS — Z915 Personal history of self-harm: Secondary | ICD-10-CM | POA: Diagnosis not present

## 2019-09-21 DIAGNOSIS — Z79899 Other long term (current) drug therapy: Secondary | ICD-10-CM | POA: Diagnosis not present

## 2019-09-21 DIAGNOSIS — R45851 Suicidal ideations: Secondary | ICD-10-CM | POA: Diagnosis present

## 2019-09-21 DIAGNOSIS — Z590 Homelessness: Secondary | ICD-10-CM | POA: Diagnosis not present

## 2019-09-21 DIAGNOSIS — F419 Anxiety disorder, unspecified: Secondary | ICD-10-CM | POA: Diagnosis present

## 2019-09-21 DIAGNOSIS — Z7902 Long term (current) use of antithrombotics/antiplatelets: Secondary | ICD-10-CM | POA: Diagnosis not present

## 2019-09-21 DIAGNOSIS — F329 Major depressive disorder, single episode, unspecified: Secondary | ICD-10-CM | POA: Diagnosis present

## 2019-09-21 DIAGNOSIS — E119 Type 2 diabetes mellitus without complications: Secondary | ICD-10-CM | POA: Diagnosis present

## 2019-09-21 DIAGNOSIS — F1424 Cocaine dependence with cocaine-induced mood disorder: Secondary | ICD-10-CM

## 2019-09-21 DIAGNOSIS — Z803 Family history of malignant neoplasm of breast: Secondary | ICD-10-CM | POA: Diagnosis not present

## 2019-09-21 DIAGNOSIS — G4700 Insomnia, unspecified: Secondary | ICD-10-CM | POA: Diagnosis present

## 2019-09-21 DIAGNOSIS — Z825 Family history of asthma and other chronic lower respiratory diseases: Secondary | ICD-10-CM | POA: Diagnosis not present

## 2019-09-21 DIAGNOSIS — E785 Hyperlipidemia, unspecified: Secondary | ICD-10-CM | POA: Diagnosis present

## 2019-09-21 DIAGNOSIS — Z20828 Contact with and (suspected) exposure to other viral communicable diseases: Secondary | ICD-10-CM | POA: Diagnosis present

## 2019-09-21 DIAGNOSIS — I110 Hypertensive heart disease with heart failure: Secondary | ICD-10-CM | POA: Diagnosis present

## 2019-09-21 DIAGNOSIS — J45909 Unspecified asthma, uncomplicated: Secondary | ICD-10-CM | POA: Diagnosis present

## 2019-09-21 DIAGNOSIS — F17210 Nicotine dependence, cigarettes, uncomplicated: Secondary | ICD-10-CM | POA: Diagnosis present

## 2019-09-21 DIAGNOSIS — Z833 Family history of diabetes mellitus: Secondary | ICD-10-CM | POA: Diagnosis not present

## 2019-09-21 DIAGNOSIS — I509 Heart failure, unspecified: Secondary | ICD-10-CM | POA: Diagnosis present

## 2019-09-21 DIAGNOSIS — Z8673 Personal history of transient ischemic attack (TIA), and cerebral infarction without residual deficits: Secondary | ICD-10-CM | POA: Diagnosis not present

## 2019-09-21 DIAGNOSIS — Z794 Long term (current) use of insulin: Secondary | ICD-10-CM | POA: Diagnosis not present

## 2019-09-21 DIAGNOSIS — Z8249 Family history of ischemic heart disease and other diseases of the circulatory system: Secondary | ICD-10-CM | POA: Diagnosis not present

## 2019-09-21 LAB — COMPREHENSIVE METABOLIC PANEL
ALT: 11 U/L (ref 0–44)
AST: 12 U/L — ABNORMAL LOW (ref 15–41)
Albumin: 3.1 g/dL — ABNORMAL LOW (ref 3.5–5.0)
Alkaline Phosphatase: 88 U/L (ref 38–126)
Anion gap: 9 (ref 5–15)
BUN: 18 mg/dL (ref 8–23)
CO2: 29 mmol/L (ref 22–32)
Calcium: 8.6 mg/dL — ABNORMAL LOW (ref 8.9–10.3)
Chloride: 95 mmol/L — ABNORMAL LOW (ref 98–111)
Creatinine, Ser: 1.34 mg/dL — ABNORMAL HIGH (ref 0.44–1.00)
GFR calc Af Amer: 49 mL/min — ABNORMAL LOW (ref >60)
GFR calc non Af Amer: 43 mL/min — ABNORMAL LOW (ref >60)
Glucose, Bld: 304 mg/dL — ABNORMAL HIGH (ref 70–99)
Potassium: 3 mmol/L — ABNORMAL LOW (ref 3.5–5.1)
Sodium: 133 mmol/L — ABNORMAL LOW (ref 135–145)
Total Bilirubin: 0.4 mg/dL (ref 0.3–1.2)
Total Protein: 6.2 g/dL — ABNORMAL LOW (ref 6.5–8.1)

## 2019-09-21 LAB — CBC
HCT: 40.5 % (ref 36.0–46.0)
Hemoglobin: 12.4 g/dL (ref 12.0–15.0)
MCH: 27.3 pg (ref 26.0–34.0)
MCHC: 30.6 g/dL (ref 30.0–36.0)
MCV: 89 fL (ref 80.0–100.0)
Platelets: 253 10*3/uL (ref 150–400)
RBC: 4.55 MIL/uL (ref 3.87–5.11)
RDW: 13.4 % (ref 11.5–15.5)
WBC: 6.5 10*3/uL (ref 4.0–10.5)
nRBC: 0 % (ref 0.0–0.2)

## 2019-09-21 LAB — GLUCOSE, CAPILLARY
Glucose-Capillary: 230 mg/dL — ABNORMAL HIGH (ref 70–99)
Glucose-Capillary: 264 mg/dL — ABNORMAL HIGH (ref 70–99)
Glucose-Capillary: 283 mg/dL — ABNORMAL HIGH (ref 70–99)
Glucose-Capillary: 267 mg/dL — ABNORMAL HIGH (ref 70–99)

## 2019-09-21 LAB — TSH: TSH: 0.76 u[IU]/mL (ref 0.350–4.500)

## 2019-09-21 MED ORDER — INSULIN GLARGINE 100 UNIT/ML ~~LOC~~ SOLN: 5.0000 [IU] | Freq: Every day | SUBCUTANEOUS | Status: DC

## 2019-09-21 MED ORDER — GABAPENTIN 100 MG PO CAPS
100.0000 mg | ORAL_CAPSULE | Freq: Three times a day (TID) | ORAL | Status: DC
  Administered 2019-09-21 – 2019-09-22 (×2): 100 mg via ORAL
  Filled 2019-09-21 (×7): qty 1

## 2019-09-21 MED ORDER — ESCITALOPRAM OXALATE 5 MG PO TABS
5.0000 mg | ORAL_TABLET | Freq: Every day | ORAL | Status: DC
  Administered 2019-09-21 – 2019-09-22 (×2): 5 mg via ORAL
  Filled 2019-09-21 (×4): qty 1

## 2019-09-21 MED ORDER — ROSUVASTATIN CALCIUM 20 MG PO TABS
40.0000 mg | ORAL_TABLET | Freq: Every day | ORAL | Status: DC
  Administered 2019-09-22 – 2019-09-24 (×3): 40 mg via ORAL
  Filled 2019-09-21 (×3): qty 1
  Filled 2019-09-21: qty 14
  Filled 2019-09-21 (×2): qty 1

## 2019-09-21 MED ORDER — INSULIN GLARGINE 100 UNIT/ML ~~LOC~~ SOLN
10.0000 [IU] | Freq: Every day | SUBCUTANEOUS | Status: DC
  Administered 2019-09-21 – 2019-09-23 (×3): 10 [IU] via SUBCUTANEOUS

## 2019-09-21 NOTE — H&P (Addendum)
Psychiatric Admission Assessment Adult  Patient Identification: Lisa Anderson MRN:  [id number] Date of Evaluation:  09/21/2019 Chief Complaint:  "I do well for a while and then the drug use starts back up." Principal Diagnosis: <principal problem not specified> Diagnosis:  Active Problems:   MDD (major depressive disorder)  History of Present Illness: Ms. Formosa is a 63 year old female with history of cocaine use disorder, depression, HTN, and diabetes, who presented to [facility] as a walk-in for cocaine dependence with depression and suicidal ideation. Patient was admitted to [facility] in December 2019 for similar presentation after her daughter had thrown her out of the house for cocaine use. She was discharged at that time on Remeron, Neurontin, and trazodone. The patient reports six months of sobriety after last admission but relapsed on cocaine in May 2020. She reports conflict with her daughter with whom she lives. 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 She reports guilt and depression related to drug use, with suicidal ideation to overdose on her medications or shoot herself. She denies suicidal plan or intent on the unit. She is seen at [facility] and prescribed Lexapro and Neurontin. She reports being off all medications for one month due to not being able to afford them. She denies drug use besides cocaine. Denies alcohol use. Labwork is pending. She denies HI/AVH.  Associated Signs/Symptoms: Depression Symptoms:  depressed mood, anhedonia, insomnia, fatigue, hopelessness, suicidal thoughts with specific plan, (Hypo) Manic Symptoms:  denies Anxiety Symptoms:  Excessive Worry, Psychotic Symptoms:  denies PTSD Symptoms: Negative Total Time spent with patient: 30 minutes  Past Psychiatric History: History of cocaine use disorder, alcohol use disorder, and depression. Multiple prior hospitalizations for similar presentation. History of suicide attempts years  ago.  Is the patient at risk to self? Yes.    Has the patient been a risk to self in the past 6 months? Yes.    Has the patient been a risk to self within the distant past? Yes.    Is the patient a risk to others? No.  Has the patient been a risk to others in the past 6 months? No.  Has the patient been a risk to others within the distant past? No.   Prior Inpatient Therapy: Prior Inpatient Therapy: Yes Prior Therapy Dates: Multiple - pt states 2018 and 2019 Prior Therapy Facilty/Provider(s): [facility] Reason for Treatment: SA, depression Prior Outpatient Therapy: Prior Outpatient Therapy: No Does patient have an ACCT team?: No Does patient have Intensive In-House Services?  : No Does patient have [facility] services? : Yes Does patient have P4CC services?: No  Alcohol Screening: 1. How often do you have a drink containing alcohol?: 4 or more times a week 2. How many drinks containing alcohol do you have on a typical day when you are drinking?: 1 or 2 3. How often do you have six or more drinks on one occasion?: Weekly AUDIT-C Score: 7 4. How often during the last year have you found that you were not able to stop drinking once you had started?: Weekly 5. How often during the last year have you failed to do what was normally expected from you becasue of drinking?: Weekly 6. How often during the last year have you needed a first drink in the morning to get yourself going after a heavy drinking session?: Weekly 7. How often during the last year have you had a feeling of guilt of remorse after drinking?: Weekly 8. How  often during the last year have you been unable to remember what happened the night before because you had been drinking?: Weekly 9. Have you or someone else been injured as a result of your drinking?: No 10. Has a relative or friend or a doctor or another health worker been concerned about your drinking or suggested you cut down?: No Alcohol Use Disorder Identification  Test Final Score (AUDIT): 22 Alcohol Brief Interventions/Follow-up: Brief Advice, Continued Monitoring Substance Abuse History in the last 12 months:  Yes.   Consequences of Substance Abuse: Family Consequences:  conflict with daughter Previous Psychotropic Medications: Yes  Psychological Evaluations: No  Past Medical History:  Past Medical History:  Diagnosis Date  . Asthma   . Cancer ([place])    breast  . Chronic congestive heart failure with left ventricular diastolic dysfunction ([place])   . Diabetes mellitus   . Hyperlipidemia   . Hypertension   . Stroke [facility])     Past Surgical History:  Procedure Laterality Date  . BREAST LUMPECTOMY    . EP IMPLANTABLE DEVICE N/A 08/07/2015   Procedure: Loop Recorder Insertion;  Surgeon: Thompson Grayer, MD;  Location: [place] CV LAB;  Service: Cardiovascular;  Laterality: N/A;  . TEE WITHOUT CARDIOVERSION N/A 08/07/2015   Procedure: TRANSESOPHAGEAL ECHOCARDIOGRAM (TEE);  Surgeon: Sueanne Margarita, MD;  Location: [facility] ENDOSCOPY;  Service: Cardiovascular;  Laterality: N/A;  . TONSILLECTOMY     Family History:  Family History  Problem Relation Age of Onset  . Diabetes Mother   . Hyperlipidemia Mother   . Hypertension Mother   . Breast cancer Mother   . Rheum arthritis Father   . Asthma Father   . Stroke Father   . Thyroid disease Father   . Thyroid disease Brother   . Colon cancer Brother        in his 7s  . Breast cancer Sister    Family Psychiatric  History: Denies Tobacco Screening:   Social History:  Social History   Substance and Sexual Activity  Alcohol Use Yes  . Alcohol/week: 6.0 standard drinks  . Types: 6 Cans of beer per week   Comment: occasionally     Social History   Substance and Sexual Activity  Drug Use Not Currently  . Types: "Crack" cocaine, Cocaine   Comment: last used a year ago    Additional Social History: Marital status: Divorced    Pain Medications: Please see MAR Prescriptions: Please see  MAR Over the Counter: Please see MAR History of alcohol / drug use?: Yes                    Allergies:   Allergies  Allergen Reactions  . Prednisone Hives and Other (See Comments)    Made her "crazy", suicidal  . Benicar [Olmesartan] Other (See Comments)    Did not work at all for patient  . Celexa [Citalopram] Other (See Comments)    Made patient feel crazy, fluoxetine is fine  . Effexor [Venlafaxine] Other (See Comments)    Made patient feel crazy  . Shrimp [Shellfish Allergy] Hives and Swelling   Lab Results:  Results for orders placed or performed during the hospital encounter of 09/20/19 (from the past 48 hour(s))  Respiratory Panel by RT PCR (Flu A&B, Covid) - Nasopharyngeal Swab     Status: None   Collection Time: 09/20/19  4:20 PM   Specimen: Nasopharyngeal Swab  Result Value Ref Range   SARS Coronavirus 2 by RT PCR NEGATIVE  NEGATIVE    Comment: (NOTE) SARS-CoV-2 target nucleic acids are NOT DETECTED. The SARS-CoV-2 RNA is generally detectable in upper respiratoy specimens during the acute phase of infection. The lowest concentration of SARS-CoV-2 viral copies this assay can detect is 131 copies/mL. A negative result does not preclude SARS-Cov-2 infection and should not be used as the sole basis for treatment or other patient management decisions. A negative result may occur with  improper specimen collection/handling, submission of specimen other than nasopharyngeal swab, presence of viral mutation(s) within the areas targeted by this assay, and inadequate number of viral copies (<131 copies/mL). A negative result must be combined with clinical observations, patient history, and epidemiological information. The expected result is Negative. Fact Sheet for Patients:  PinkCheek.be Fact Sheet for Healthcare Providers:  GravelBags.it This test is not yet ap proved or cleared by the [place] FDA and   has been authorized for detection and/or diagnosis of SARS-CoV-2 by FDA under an Emergency Use Authorization (EUA). This EUA will remain  in effect (meaning this test can be used) for the duration of the COVID-19 declaration under Section 564(b)(1) of the Act, 21 U.S.C. section 360bbb-3(b)(1), unless the authorization is terminated or revoked sooner.    Influenza A by PCR NEGATIVE NEGATIVE   Influenza B by PCR NEGATIVE NEGATIVE    Comment: (NOTE) The Xpert Xpress SARS-CoV-2/FLU/RSV assay is intended as an aid in  the diagnosis of influenza from Nasopharyngeal swab specimens and  should not be used as a sole basis for treatment. Nasal washings and  aspirates are unacceptable for Xpert Xpress SARS-CoV-2/FLU/RSV  testing. Fact Sheet for Patients: PinkCheek.be Fact Sheet for Healthcare Providers: GravelBags.it This test is not yet approved or cleared by the [place] FDA and  has been authorized for detection and/or diagnosis of SARS-CoV-2 by  FDA under an Emergency Use Authorization (EUA). This EUA will remain  in effect (meaning this test can be used) for the duration of the  Covid-19 declaration under Section 564(b)(1) of the Act, 21  U.S.C. section 360bbb-3(b)(1), unless the authorization is  terminated or revoked. Performed at [facility], [place] [address] [place], [place] [address]   Glucose, capillary     Status: Abnormal   Collection Time: 09/20/19  5:07 PM  Result Value Ref Range   Glucose-Capillary 277 (H) 70 - 99 mg/dL  Glucose, capillary     Status: Abnormal   Collection Time: 09/20/19 10:45 PM  Result Value Ref Range   Glucose-Capillary 170 (H) 70 - 99 mg/dL   Comment 1 Notify RN    Comment 2 Document in Chart   Glucose, capillary     Status: Abnormal   Collection Time: 09/21/19  8:08 AM  Result Value Ref Range   Glucose-Capillary 230 (H) 70 - 99 mg/dL   Comment 1 Notify RN     Comment 2 Document in Chart   Glucose, capillary     Status: Abnormal   Collection Time: 09/21/19 11:55 AM  Result Value Ref Range   Glucose-Capillary 264 (H) 70 - 99 mg/dL    Blood Alcohol level:  Lab Results  Component Value Date   ETH <10 09/18/2018   ETH <5 01/60/1093    Metabolic Disorder Labs:  Lab Results  Component Value Date   HGBA1C 7.1 (A) 01/16/2019   MPG 171.42 12/06/2017   MPG 237 11/02/2015   No results found for: PROLACTIN Lab Results  Component Value Date   CHOL 209 (H) 01/16/2019   TRIG 110  01/16/2019   HDL 48 01/16/2019   CHOLHDL 4.4 01/16/2019   VLDL 37 12/06/2017   LDLCALC 139 (H) 01/16/2019   LDLCALC 20 12/06/2017    Current Medications: Current Facility-Administered Medications  Medication Dose Route Frequency Provider Last Rate Last Admin  . acetaminophen (TYLENOL) tablet 650 mg  650 mg Oral Q6H PRN Lewis, Marcy Panning, NP      . albuterol (VENTOLIN HFA) 108 (90 Base) MCG/ACT inhaler 1-2 puff  1-2 puff Inhalation Q6H PRN Derrill Center, NP      . alum & mag hydroxide-simeth (MAALOX/MYLANTA) 200-200-20 MG/5ML suspension 30 mL  30 mL Oral Q4H PRN Derrill Center, NP      . cloNIDine (CATAPRES) tablet 0.1 mg  0.1 mg Oral BID Derrill Center, NP   0.1 mg at 09/21/19 6979  . clopidogrel (PLAVIX) tablet 75 mg  75 mg Oral Daily Derrill Center, NP   75 mg at 09/21/19 4801  . gabapentin (NEURONTIN) capsule 300 mg  300 mg Oral BID Derrill Center, NP   300 mg at 09/21/19 6553  . hydrALAZINE (APRESOLINE) tablet 50 mg  50 mg Oral BID Derrill Center, NP   50 mg at 09/21/19 7482  . hydrochlorothiazide (MICROZIDE) capsule 12.5 mg  12.5 mg Oral Daily Derrill Center, NP   12.5 mg at 09/21/19 7078  . hydrOXYzine (ATARAX/VISTARIL) tablet 25 mg  25 mg Oral TID PRN Derrill Center, NP      . insulin aspart (novoLOG) injection 0-5 Units  0-5 Units Subcutaneous QHS Lewis, Tanika N, NP      . insulin aspart (novoLOG) injection 0-9 Units  0-9 Units Subcutaneous TID WC  Derrill Center, NP   5 Units at 09/21/19 1212  . magnesium hydroxide (MILK OF MAGNESIA) suspension 30 mL  30 mL Oral Daily PRN Ricky Ala N, NP      . nicotine (NICODERM CQ - dosed in mg/24 hr) patch 7 mg  7 mg Transdermal Daily Derrill Center, NP      . traZODone (DESYREL) tablet 50 mg  50 mg Oral QHS PRN Derrill Center, NP       PTA Medications: Medications Prior to Admission  Medication Sig Dispense Refill Last Dose  . albuterol (VENTOLIN HFA) 108 (90 Base) MCG/ACT inhaler Inhale 2 puffs into the lungs every 6 (six) hours as needed for wheezing or shortness of breath. Shortness of breath 1 Inhaler 3 Unknown at Unknown time  . Blood Glucose Monitoring Suppl (CONTOUR NEXT MONITOR) w/Device KIT 1 strip 3 (three) times daily by Does not apply route. E11.9 1 kit 0 Unknown at Unknown time  . Cholecalciferol (VITAMIN D PO) Take 1-2 tablets by mouth daily.   Unknown at Unknown time  . cloNIDine (CATAPRES) 0.1 MG tablet Take 1 tablet (0.1 mg total) by mouth 2 (two) times daily in the am and at bedtime.. 60 tablet 11 Unknown at Unknown time  . clopidogrel (PLAVIX) 75 MG tablet Take 1 tablet (75 mg total) by mouth daily. 90 tablet 1 Unknown at Unknown time  . diltiazem (CARDIZEM CD) 240 MG 24 hr capsule Take 1 capsule (240 mg total) by mouth daily. 90 capsule 1 Unknown at Unknown time  . gabapentin (NEURONTIN) 600 MG tablet Take 1 tablet (600 mg total) by mouth 2 (two) times daily. 60 tablet 0 Unknown at Unknown time  . glipiZIDE (GLUCOTROL) 10 MG tablet Take 1 tablet (10 mg total) by mouth 2 (two) times daily. [place]  tablet 1 Unknown at Unknown time  . glucose blood (CONTOUR NEXT TEST) test strip Use as instructed 3 times daily. E11.9 100 each 12 Unknown at Unknown time  . hydrALAZINE (APRESOLINE) 50 MG tablet Take 1 tablet (50 mg total) by mouth 2 (two) times daily. 60 tablet 4 Unknown at Unknown time  . hydrochlorothiazide (HYDRODIURIL) 12.5 MG tablet Take 1 tablet (12.5 mg total) by mouth daily. 90  tablet 1 Unknown at Unknown time  . insulin aspart (NOVOLOG) 100 UNIT/ML injection Sliding scale; max 9 units TID 10 mL 3 Unknown at Unknown time  . insulin glargine (LANTUS) 100 UNIT/ML injection Inject 0.13 mLs (13 Units total) into the skin at bedtime. 10 mL 4 Unknown at Unknown time  . nicotine (NICODERM CQ) 7 mg/24hr patch Place 1 patch (7 mg total) onto the skin daily. 28 patch 0 Unknown at Unknown time  . potassium chloride SA (KLOR-CON M20) 20 MEQ tablet Take 1 tablet (20 mEq total) by mouth daily. 90 tablet 1 Unknown at Unknown time  . rosuvastatin (CRESTOR) 40 MG tablet Take 1 tablet (40 mg total) by mouth daily. 90 tablet 1 Unknown at Unknown time  . traZODone (DESYREL) 50 MG tablet Take 1 tablet (50 mg total) by mouth at bedtime. 30 tablet 0 Unknown at Unknown time    Musculoskeletal: Strength & Muscle Tone: within normal limits Gait & Station: normal Patient leans: N/A  Psychiatric Specialty Exam: Physical Exam  Nursing note and vitals reviewed. Constitutional: She is oriented to person, place, and time. She appears well-developed and well-nourished.  Cardiovascular: Normal rate.  Respiratory: Effort normal.  Neurological: She is alert and oriented to person, place, and time.    Review of Systems  Constitutional: Negative.   Respiratory: Negative for cough and shortness of breath.   Gastrointestinal: Negative for nausea and vomiting.  Neurological: Negative for headaches.  Psychiatric/Behavioral: Positive for dysphoric mood, sleep disturbance and suicidal ideas. Negative for agitation, behavioral problems, hallucinations and self-injury. The patient is not nervous/anxious.     Blood pressure (!) 141/87, pulse 83, temperature 98.1 F (36.7 C), temperature source Oral, resp. rate 20, SpO2 99 %.There is no height or weight on file to calculate BMI.  General Appearance: Disheveled  Eye Contact:  Fair  Speech:  Normal Rate  Volume:  Normal  Mood:  Dysphoric  Affect:   Congruent  Thought Process:  Coherent  Orientation:  Full (Time, Place, and Person)  Thought Content:  Rumination  Suicidal Thoughts:  Yes.  without intent/plan  Homicidal Thoughts:  No  Memory:  Immediate;   Fair Recent;   Fair Remote;   Fair  Judgement:  Intact  Insight:  Fair  Psychomotor Activity:  Decreased  Concentration:  Concentration: Fair and Attention Span: Fair  Recall:  [employer] of Knowledge:  Fair  Language:  Good  Akathisia:  No  Handed:  Right  AIMS (if indicated):     Assets:  Communication Skills Desire for Improvement Housing Resilience Social Support  ADL's:  Intact  Cognition:  WNL  Sleep:       Treatment Plan Summary: Daily contact with patient to assess and evaluate symptoms and progress in treatment and Medication management   Inpatient hospitalization.  See MD's admission SRA for medication management.  Patient will participate in the therapeutic group milieu.  Discharge disposition in progress.   Observation Level/Precautions:  15 minute checks  Laboratory:  CBC CMP BAL UDS a1c lipid panel TSH  Psychotherapy:  Group therapy  Medications:  See MAR  Consultations:  PRN  Discharge Concerns:  Safety and stabilization  Estimated LOS: 3-5 days  Other:     Physician Treatment Plan for Primary Diagnosis: <principal problem not specified> Long Term Goal(s): Improvement in symptoms so as ready for discharge  Short Term Goals: Ability to identify changes in lifestyle to reduce recurrence of condition will improve, Ability to verbalize feelings will improve and Ability to disclose and discuss suicidal ideas  Physician Treatment Plan for Secondary Diagnosis: Active Problems:   MDD (major depressive disorder)  Long Term Goal(s): Improvement in symptoms so as ready for discharge  Short Term Goals: Ability to demonstrate self-control will improve and Ability to identify and develop effective coping behaviors will improve  I certify that  inpatient services furnished can reasonably be expected to improve the patient's condition.    Connye Burkitt, NP 12/26/20202:40 PM   I have discussed case with NP and have met with patient  Agree with NP note and assessment  63 year old female, presented to hospital describing worsening depression, suicidal ideations with thoughts of overdosing, neurovegetative symptoms to include low energy, poor sleep, anhedonia, feelings of guilt relating to drug use, cocaine use disorder. She reports she has been sober for a period of 7 months, relapsed about 5 months ago.  Describes cocaine as substance of choice.  Denies alcohol abuse. In the context of relapse describes increased family tension/conflict with her daughter whom she lives with,  Reports she had not been taking her psychiatric medications over the last month or so. Patient is known to our unit from a prior admission in December 2019.  At that time presented for depression, suicidal ideations, hallucinations, cocaine use disorder.  At the time was discharged on Remeron. Reports most recent psychiatric medication regimen consisted of Lexapro and Neurontin Medical history is positive for diabetes mellitus and hypertension. Of note, reports that she was managing DM with Novolog sliding scale/Lantus Insulin ( 15 units QHS) but was not taking regularly and  had periods of days where she would not take . States, however, that she had been taking Lantus during several  days prior to admission. She was not taking any oral hypoglycemic agent .  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 We have reviewed side effect profile including potential increased risk of bleeding ( on Plavix )  I have consulted Hospitalist regarding DM , HTN management:  For DM recommendation is  to continue with current meal and night time Novolog sliding  scale coverage and to start Lantus Insulin  at 10 mgrs QHS initially.  For hypertension management recommendation is  to continue Hydralazine , HCTZ, Clonidine at current doses, not to resume diltiazem at this time ( BP today 141/87)  Have also requested Diabetes Coordinator Consult for assistance with DM management.  CBC, CMP, Lipid Panel, HgbA1C , TSH ordered .

## 2019-09-21 NOTE — [facility] Group Notes (Signed)
[facility] Group Notes: (Clinical Social Work)   09/21/2019      Type of Therapy:  Group Therapy   Participation Level:  Did Not Attend - was invited both individually by [facility] and by overhead announcement, chose not to attend.   Selmer Dominion, LCSW 09/21/2019, 3:40 PM

## 2019-09-21 NOTE — Progress Notes (Signed)
[place] NOVEL CORONAVIRUS (COVID-19) DAILY CHECK-OFF SYMPTOMS - answer yes or no to each - every day NO YES  Have you had a fever in the past 24 hours?  . Fever (Temp > 37.80C / 100F) X   Have you had any of these symptoms in the past 24 hours? . New Cough .  Sore Throat  .  Shortness of Breath .  Difficulty Breathing .  Unexplained Body Aches   X   Have you had any one of these symptoms in the past 24 hours not related to allergies?   . Runny Nose .  Nasal Congestion .  Sneezing   X   If you have had runny nose, nasal congestion, sneezing in the past 24 hours, has it worsened?  X   EXPOSURES - check yes or no X   Have you traveled outside the state in the past 14 days?  X   Have you been in contact with someone with a confirmed diagnosis of COVID-19 or PUI in the past 14 days without wearing appropriate PPE?  X   Have you been living in the same home as a person with confirmed diagnosis of COVID-19 or a PUI (household contact)?    X   Have you been diagnosed with COVID-19?    X              What to do next: Answered NO to all: Answered YES to anything:   Proceed with unit schedule Follow the [facility] Inpatient Flowsheet.   

## 2019-09-21 NOTE — Tx Team (Signed)
Initial Treatment Plan 09/21/2019 7:57 AM [name] [id number]    PATIENT STRESSORS: Financial difficulties Substance abuse   PATIENT STRENGTHS: Work skills   PATIENT IDENTIFIED PROBLEMS: SI  Get better meds  Depression Get better meds, ive been off them awhile                   DISCHARGE CRITERIA:  Ability to meet basic life and health needs Adequate post-discharge living arrangements Improved stabilization in mood, thinking, and/or behavior Medical problems require only outpatient monitoring Motivation to continue treatment in a less acute level of care Need for constant or close observation no longer present  PRELIMINARY DISCHARGE PLAN: Attend aftercare/continuing care group Outpatient therapy  PATIENT/FAMILY INVOLVEMENT: This treatment plan has been presented to and reviewed with the patient, Renika Darpino .  The patient has been given the opportunity to ask questions and make suggestions.  Ronnie Doss, RN 09/21/2019, 7:57 AM

## 2019-09-21 NOTE — Progress Notes (Signed)
ADMISSION: Lisa Anderson is a 63 y.o. female who came to [facility] due to DI, increasing guilt and anxiety, feelings of worthlessness, wanting to be alone, and alcohol/drug w/d. Pt states, "I've been thinking about ending my life; it's not getting any better." Pt states she believes she has been to [facility] in 2018 and 2019. She states she wants to, "put myself out of my misery." Pt is recognized by this Probation officer.  Pt confirms SI with a hx of attempting to kill herself 2-3 times, the last incident taking place approximately 3 years ago. Pt denies HI, AVH, NSSIB, access to guns/weapons, and engagement in the legal system.  Pt states she sees a provider at [facility], though she ran out of the medication she had been provided last week; she states she believes she takes Gabapentin 50mg  2x/day and Lexapro 20mg  1x/day. Pt states that, when she takes it regularly, it typically works well. Pt states she has an appointment at [facility] scheduled for next month. Pt states she is also on 3 blood pressure medications that she has not taken since last week since she ran out of the prescriptions.

## 2019-09-21 NOTE — Progress Notes (Signed)
   09/21/19 1800  Psych Admission Type (Psych Patients Only)  Admission Status Voluntary  Psychosocial Assessment  Patient Complaints Depression  Eye Contact Brief  Facial Expression Flat  Affect Appropriate to circumstance;Depressed;Sad  Speech Logical/coherent  Interaction Guarded  Motor Activity Other (Comment) (steady gait)  Appearance/Hygiene Unremarkable  Behavior Characteristics Cooperative;Calm  Mood Depressed;Sad;Sullen  Thought Process  Coherency WDL  Content WDL  Delusions WDL  Perception WDL  Hallucination None reported or observed  Judgment Impaired  Confusion None  Danger to Self  Current suicidal ideation? Passive  Self-Injurious Behavior No self-injurious ideation or behavior indicators observed or expressed   Agreement Not to Harm Self Yes  Description of Agreement Verbal  Danger to Others  Danger to Others None reported or observed

## 2019-09-21 NOTE — BHH Suicide Risk Assessment (Addendum)
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  World Pharmacologist [facility]). Score between 0-7:  no or low risk or alcohol related problems. Score between 8-15:  moderate risk of alcohol related problems. Score between 16-19:  high risk of alcohol related problems. Score 20 or above:  warrants further diagnostic evaluation for alcohol dependence and treatment.   CLINICAL FACTORS:  63 year old female, presented to hospital describing worsening depression, suicidal ideations with thoughts of overdosing, neurovegetative symptoms to include low energy, poor sleep, anhedonia, feelings of guilt relating to drug use, cocaine use disorder. She reports she has been sober for a period of 7 months, relapsed about 5 months ago.  Describes cocaine as substance of choice.  Denies alcohol abuse. In the context of relapse describes increased family tension/conflict with her daughter whom she lives with,  Reports she had not been taking her psychiatric medications over the last month or so. Patient is known to our unit from a prior admission in December  2019.  At that time presented for depression, suicidal ideations, hallucinations, cocaine use disorder.  At the time was discharged on Remeron. Reports most recent psychiatric medication regimen consisted of Lexapro and Neurontin Medical history is positive for diabetes mellitus and hypertension. Of note, reports that she was managing DM with Novolog sliding scale/Lantus Insulin ( 15 units QHS) but was not taking regularly and  had periods of days where she would not take . States, however, that she had been taking Lantus during several  days prior to admission. She was not taking any oral hypoglycemic agent .  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 We have reviewed side effect profile including potential increased risk of bleeding ( on Plavix )  I have consulted Hospitalist regarding DM , HTN management:  For DM recommendation is  to continue with current meal and night time Novolog sliding scale coverage and to start Lantus Insulin  at 10 mgrs QHS initially.  For hypertension management recommendation is  to continue Hydralazine , HCTZ, Clonidine at current doses, not to resume diltiazem at this time ( BP today 141/87)  Have also requested Diabetes Coordinator Consult for assistance with DM management.  CBC, CMP, Lipid Panel, HgbA1C , TSH ordered . Musculoskeletal: Strength & Muscle Tone: within normal limits Gait & Station: normal Patient leans: N/A  Psychiatric Specialty Exam: Physical Exam  Review of Systems  Denies headache, denies chest pain , no shortness of breath  Blood pressure (!) 141/87, pulse 83, temperature 98.1 F (36.7 C), temperature source Oral, resp. rate 20, SpO2 99 %.There is no height or weight on file to calculate BMI.  General Appearance: Fairly  Groomed  Eye Contact:  Fair, Bruce partially during session  Speech:  Normal Rate  Volume:   Decreased  Mood:  Depressed  Affect:  Constricted/congruent  Thought Process:  Linear and Descriptions of Associations: Intact  Orientation:  Other:  Fully alert and attentive  Thought Content:  Currently denies hallucinations and does not appear internally preoccupied at this time, no delusions are expressed  Suicidal Thoughts:  No at this time denies suicidal plan or intention and contracts for safety on unit  Homicidal Thoughts:  No  Memory:  Recent and remote grossly intact  Judgement:  Fair  Insight:  Fair  Psychomotor Activity:  Decreased no restlessness or psychomotor agitation  Concentration:  Concentration: Fair and Attention Span: Fair  Recall:  Good  Fund of Knowledge:  Good  Language:  Good  Akathisia:  Negative  Handed:  Right  AIMS (if indicated):     Assets:  Communication Skills Desire for Improvement Resilience  ADL's:  Intact  Cognition:  WNL  Sleep:         COGNITIVE FEATURES THAT CONTRIBUTE TO RISK:  Closed-mindedness and Loss of executive function    SUICIDE RISK:   Moderate:  Frequent suicidal ideation with limited intensity, and duration, some specificity in terms of plans, no associated intent, good self-control, limited dysphoria/symptomatology, some risk factors present, and identifiable protective factors, including available and accessible social support.  PLAN OF CARE: Patient will be admitted to inpatient psychiatric unit for stabilization and safety. Will provide and encourage milieu participation. Provide medication management and maked adjustments as needed.  Will follow daily.    I certify that inpatient services furnished can reasonably be expected to improve the patient's condition.   Jenne Campus, MD 09/21/2019, 2:58 PM

## 2019-09-22 LAB — HEMOGLOBIN A1C
Hgb A1c MFr Bld: 9.3 % — ABNORMAL HIGH (ref 4.8–5.6)
Mean Plasma Glucose: 220.21 mg/dL

## 2019-09-22 LAB — LIPID PANEL
Cholesterol: 211 mg/dL — ABNORMAL HIGH (ref 0–200)
HDL: 41 mg/dL (ref >40)
LDL Cholesterol: 133 mg/dL — ABNORMAL HIGH (ref 0–99)
Total CHOL/HDL Ratio: 5.1 RATIO
Triglycerides: 185 mg/dL — ABNORMAL HIGH (ref <150)
VLDL: 37 mg/dL (ref 0–40)

## 2019-09-22 LAB — GLUCOSE, CAPILLARY
Glucose-Capillary: 176 mg/dL — ABNORMAL HIGH (ref 70–99)
Glucose-Capillary: 181 mg/dL — ABNORMAL HIGH (ref 70–99)
Glucose-Capillary: 254 mg/dL — ABNORMAL HIGH (ref 70–99)
Glucose-Capillary: 207 mg/dL — ABNORMAL HIGH (ref 70–99)

## 2019-09-22 MED ORDER — ESCITALOPRAM OXALATE 5 MG PO TABS
5.0000 mg | ORAL_TABLET | Freq: Every day | ORAL | Status: DC
  Administered 2019-09-23: 09:00:00 5 mg via ORAL
  Filled 2019-09-22 (×2): qty 1

## 2019-09-22 MED ORDER — POTASSIUM CHLORIDE CRYS ER 20 MEQ PO TBCR: 20.0000 meq | EXTENDED_RELEASE_TABLET | Freq: Two times a day (BID) | ORAL | Status: DC

## 2019-09-22 MED ORDER — POTASSIUM CHLORIDE CRYS ER 10 MEQ PO TBCR
10.0000 meq | EXTENDED_RELEASE_TABLET | Freq: Two times a day (BID) | ORAL | Status: AC
  Administered 2019-09-22 – 2019-09-24 (×4): 10 meq via ORAL
  Filled 2019-09-22 (×4): qty 1

## 2019-09-22 MED ORDER — POTASSIUM CHLORIDE CRYS ER 10 MEQ PO TBCR: 10.0000 meq | EXTENDED_RELEASE_TABLET | Freq: Two times a day (BID) | ORAL | Status: DC

## 2019-09-22 MED ORDER — GABAPENTIN 100 MG PO CAPS
200.0000 mg | ORAL_CAPSULE | Freq: Three times a day (TID) | ORAL | Status: DC
  Administered 2019-09-22 – 2019-09-23 (×3): 200 mg via ORAL
  Filled 2019-09-22 (×7): qty 2

## 2019-09-22 MED ORDER — ESCITALOPRAM OXALATE 10 MG PO TABS
10.0000 mg | ORAL_TABLET | Freq: Every day | ORAL | Status: DC
  Filled 2019-09-22 (×2): qty 1

## 2019-09-22 NOTE — BHH Group Notes (Signed)
[facility] LCSW Group Therapy Note  09/22/2019  10:00-11:00AM  Type of Therapy and Topic:  Group Therapy:  A Hero Worthy of Support  Participation Level:  Active   Description of Group:  Patients in this group were introduced to the concept that additional supports including self-support are an essential part of recovery.  Matching needs with supports to help fulfill those needs was explained.  A song "I Got To Live" was played for the group and was followed by a discussion of what it meant to participants.   The consensus was that the message was to give themselves permission to see happiness in life.  A song entitled "My Own Hero" was played and a group discussion ensued in which patients stated it inspired them to help themselves in order to succeed, because other people cannot achieve their goals such as sobriety or stability for them.  A song was played called "I Am Enough" which led to a discussion about being willing to believe we are worth the effort of being a self-support.   Therapeutic Goals: 1)  demonstrate the importance of being a key part of one's own support system 2)  discuss various available supports 3)  encourage patient to use music as part of their self-support and focus on goals 4)  elicit ideas from patients about supports that need to be added   Summary of Patient Progress:  The patient expressed that she wishes she had support and is partially in the hospital to try to establish some supports.  She stated that her family is unhealthy for her because they do not like the things she does and therefore "have no dealings" with her. She was out of the room for about half of group due to being called out by a provider.  Therapeutic Modalities:   Motivational Interviewing Activity  Maretta Los
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
## 2019-09-22 NOTE — Progress Notes (Signed)
D. Pt's mood appears to have slightly improved since yesterday. Pt observed in the dayroom attending group this am. Per pt's self inventory, pt rated her depression and hopelessness an 8/10, respectively. Pt continues to endorse passive SI with no plan- denies A/VH  A. Labs and vitals monitored. Pt compliant with medications. Pt supported emotionally and encouraged to express concerns and ask questions.   R. Pt remains safe with 15 minute checks. Will continue POC.

## 2019-09-22 NOTE — Progress Notes (Signed)
Inpatient Diabetes Program Recommendations  AACE/ADA: New Consensus Statement on Inpatient Glycemic Control  Target Ranges:  Prepandial:   less than 140 mg/dL      Peak postprandial:   less than 180 mg/dL (1-2 hours)      Critically ill patients:  140 - 180 mg/dL   Results for Lisa Anderson, Lisa Anderson (MRN [id number]) as of 09/22/2019 08:59  Ref. Range 09/21/2019 08:08 09/21/2019 11:55 09/21/2019 16:48 09/21/2019 20:46 09/22/2019 06:50  Glucose-Capillary Latest Ref Range: 70 - 99 mg/dL 230 (H) 264 (H) 283 (H) 267 (H) 176 (H)   Review of Glycemic Control  Diabetes history: DM2 Outpatient Diabetes medications: Lantus 13 units QHS, Glipizide 10 mg BID, Novolog TID with meals Current orders for Inpatient glycemic control: Lantus 10 units QHS, Novolog 0-9 units TID with meals, Novolog 0-5 units QHS  Inpatient Diabetes Program Recommendations:   Insulin-Meal Coverage: If post prandial glucose is consistently greater than 180 mg/dl today, please consider ordering Novolog 3 units TID with meals for meal coverage if patient eats at least 50% of meals.  NOTE: Noted consult for Diabetes Coordinator. Diabetes Coordinator is not on campus over the weekend but available by pager from 8am to 5pm for questions or concerns. Chart reviewed. Noted Lantus 10 units given last night and fasting CBG 176 mg/dl today.   Thanks, Barnie Alderman, RN, MSN, CDE Diabetes Coordinator Inpatient Diabetes Program [phone] (Team Pager from 8am to 5pm)

## 2019-09-22 NOTE — Progress Notes (Signed)
  [facility] Group Notes:  (Nursing/MHT/Case Management/Adjunct)  Date:  09/22/2019  Time:  1300  Type of Therapy:  Nurse Education Discussed healthy support systems and created collages.   Participation Level:  Did Not Attend   Marissa Calamity

## 2019-09-22 NOTE — Progress Notes (Signed)
Patient ID: Lisa Anderson, female   DOB: October 16, 1955, 63 y.o.   MRN: [id number] D: Pt reclusive to her room, refused to come to the med window for her night time meds, and meds were taken to her.  Pt with blunted affect and depressed mood, but denies SI/HI/AVH.  A: Pt is being maintained on Q15 minute checks for safety.  R: Will continue to monitor on Q15 minute checks for safety.

## 2019-09-22 NOTE — Progress Notes (Addendum)
[facility] MD Progress Note  09/22/2019 5:08 PM Lisa Anderson  MRN:  [id number] Subjective: Patient reports he still feels depressed, feeling "guilty" about her substance abuse/relapse.  Denies medication side effects.  Denies suicidal ideations at this time. Objective: I have reviewed chart notes and met with patient. 63 year old female, presented to hospital with worsening depression, suicidal ideations with thoughts of overdosing, neurovegetative symptoms.  History of cocaine use disorder, had relapsed about 5 months ago following a 33-monthperiod of sobriety.  Had not been taking psychiatric medications times about a month prior to admission.  Today patient remains depressed, constricted, although affect is more reactive than on admission.  Briefly tearful when speaking about her relapse, but improved with support and encouragement.  Denies suicidal ideations. Denies medication side effects thus far-currently on Lexapro and Neurontin.  Patient has a history of hypertension-BP today 142/72, pulse 77. CBGs today have trended down (176 and 181) HgbA1C ordered, currently pending  Labs reviewed . Creat 1.34 ( 1.28 in July 2020) , Na+ 133, K+ 3.0, CBC unremarkable , TSH 0.760 No disruptive or agitated behaviors on unit, Little milieu/group participation thus far, polite on approach.  Principal Problem: Depression, cocaine use disorder Diagnosis: Active Problems:   MDD (major depressive disorder)   Cocaine dependence with cocaine-induced mood disorder ([facility]  Total Time spent with patient: 20 minutes  Past Psychiatric History:   Past Medical History:  Past Medical History:  Diagnosis Date  . Asthma   . Cancer ([place]    breast  . [place] congestive heart failure [place] ventricular diastolic dysfunction ([place]   . Diabetes mellitus   . Hyperlipidemia   . Hypertension   . Stroke ([facility]     Past Surgical History:  Procedure Laterality Date  . BREAST LUMPECTOMY    . EP IMPLANTABLE DEVICE N/A  08/07/2015   Procedure: Loop Recorder Insertion;  Surgeon: JThompson Grayer MD;  [facility];  Service: Cardiovascular;  Laterality: N/A;  . TEE WITHOUT CARDIOVERSION N/A 08/07/2015   Procedure: TRANSESOPHAGEAL ECHOCARDIOGRAM (TEE);  Surgeon: TSueanne Margarita MD;  [facility];  Service: Cardiovascular;  Laterality: N/A;  . TONSILLECTOMY     Family History:  Family History  Problem Relation Age of Onset  . Diabetes Mother   . Hyperlipidemia Mother   . Hypertension Mother   . Breast cancer Mother   . Rheum arthritis Father   . Asthma Father   . Stroke Father   . Thyroid disease Father   . Thyroid disease Brother   . Colon cancer Brother        in his 570s . Breast cancer Sister    Family Psychiatric  History:  Social History:  Social History   Substance and Sexual Activity  Alcohol Use Yes  . Alcohol/week: 6.0 standard drinks  . Types: 6 Cans of beer per week   Comment: occasionally     Social History   Substance and Sexual Activity  Drug Use Not Currently  . Types: "Crack" cocaine, Cocaine   Comment: last used a year ago    Social History   Socioeconomic History  . Marital status: Single    Spouse name: Not on file  . Number of children: 5  . Years of education: 150 . Highest education level: Not on file  Occupational History  . Occupation: CNA- [employer]  Tobacco Use  . Smoking status: Current Every Day Smoker    Packs/day: 0.25    Types: Cigarettes  .  Smokeless tobacco: Never Used  . Tobacco comment: pt smokes 8 per day  Substance and Sexual Activity  . Alcohol use: Yes    Alcohol/week: 6.0 standard drinks    Types: 6 Cans of beer per week    Comment: occasionally  . Drug use: Not Currently    Types: "Crack" cocaine, Cocaine    Comment: last used a year ago  . Sexual activity: Never    Comment: recovering cocaine addict x6 months  Other Topics Concern  . Not on file  Social History Narrative   Lives with  daughter      Caffeine use: Drinks soda (2- 20 oz soda per day)   Social Determinants of Health   Financial Resource Strain:   . Difficulty of Paying Living Expenses: Not on file  Food Insecurity:   . Worried About Charity fundraiser in the Last Year: Not on file  . Ran Out of Food in the Last Year: Not on file  Transportation Needs:   . Lack of Transportation (Medical): Not on file  . Lack of Transportation (Non-Medical): Not on file  Physical Activity:   . Days of Exercise per Week: Not on file  . Minutes of Exercise per Session: Not on file  Stress:   . Feeling of Stress : Not on file  Social Connections:   . Frequency of Communication with Friends and Family: Not on file  . Frequency of Social Gatherings with Friends and Family: Not on file  . Attends Religious Services: Not on file  . Active Member of Clubs or Organizations: Not on file  . Attends Archivist Meetings: Not on file  . Marital Status: Not on file   Additional Social History:    Pain Medications: Please see MAR Prescriptions: Please see MAR Over the Counter: Please see MAR History of alcohol / drug use?: Yes  Sleep: Fair  Appetite:  Fair  Current Medications: Current Facility-Administered Medications  Medication Dose Route Frequency Provider Last Rate Last Admin  . acetaminophen (TYLENOL) tablet 650 mg  650 mg Oral Q6H PRN Derrill Center, NP   650 mg at 09/22/19 1047  . albuterol (VENTOLIN HFA) 108 (90 Base) MCG/ACT inhaler 1-2 puff  1-2 puff Inhalation Q6H PRN Derrill Center, NP      . alum & mag hydroxide-simeth (MAALOX/MYLANTA) 200-200-20 MG/5ML suspension 30 mL  30 mL Oral Q4H PRN Derrill Center, NP      . cloNIDine (CATAPRES) tablet 0.1 mg  0.1 mg Oral BID Derrill Center, NP   0.1 mg at 09/22/19 0827  . clopidogrel (PLAVIX) tablet 75 mg  75 mg Oral Daily Derrill Center, NP   75 mg at 09/22/19 0827  . [START ON 09/23/2019] escitalopram (LEXAPRO) tablet 10 mg  10 mg Oral Daily Sira Adsit,  Yorley Buch A, MD      . gabapentin (NEURONTIN) capsule 200 mg  200 mg Oral TID Jailynne Opperman, Myer Peer, MD   200 mg at 09/22/19 1108  . hydrALAZINE (APRESOLINE) tablet 50 mg  50 mg Oral BID Derrill Center, NP   50 mg at 09/22/19 0827  . hydrochlorothiazide (MICROZIDE) capsule 12.5 mg  12.5 mg Oral Daily Derrill Center, NP   12.5 mg at 09/22/19 0827  . hydrOXYzine (ATARAX/VISTARIL) tablet 25 mg  25 mg Oral TID PRN Derrill Center, NP   25 mg at 09/21/19 1048  . insulin aspart (novoLOG) injection 0-5 Units  0-5 Units Subcutaneous QHS Derrill Center,  NP   3 Units at 09/21/19 2213  . insulin aspart (novoLOG) injection 0-9 Units  0-9 Units Subcutaneous TID WC Derrill Center, NP   2 Units at 09/22/19 1216  . insulin glargine (LANTUS) injection 10 Units  10 Units Subcutaneous QHS Marcello Tuzzolino, Myer Peer, MD   10 Units at 09/21/19 2212  . magnesium hydroxide (MILK OF MAGNESIA) suspension 30 mL  30 mL Oral Daily PRN Derrill Center, NP      . nicotine (NICODERM CQ - dosed in mg/24 hr) patch 7 mg  7 mg Transdermal Daily Derrill Center, NP   7 mg at 09/22/19 7616  . rosuvastatin (CRESTOR) tablet 40 mg  40 mg Oral q1800 Connye Burkitt, NP      . traZODone (DESYREL) tablet 50 mg  50 mg Oral QHS PRN Derrill Center, NP        Lab Results:  Results for orders placed or performed during the hospital encounter of 09/20/19 (from the past 48 hour(s))  Glucose, capillary     Status: Abnormal   Collection Time: 09/20/19 10:45 PM  Result Value Ref Range   Glucose-Capillary 170 (H) 70 - 99 mg/dL   Comment 1 Notify RN    Comment 2 Document in Chart   Glucose, capillary     Status: Abnormal   Collection Time: 09/21/19  8:08 AM  Result Value Ref Range   Glucose-Capillary 230 (H) 70 - 99 mg/dL   Comment 1 Notify RN    Comment 2 Document in Chart   Glucose, capillary     Status: Abnormal   Collection Time: 09/21/19 11:55 AM  Result Value Ref Range   Glucose-Capillary 264 (H) 70 - 99 mg/dL  Glucose, capillary     Status:  Abnormal   Collection Time: 09/21/19  4:48 PM  Result Value Ref Range   Glucose-Capillary 283 (H) 70 - 99 mg/dL   Comment 1 Notify RN    Comment 2 Document in Chart   CBC     Status: None   Collection Time: 09/21/19  6:43 PM  Result Value Ref Range   WBC 6.5 4.0 - 10.5 K/uL   RBC 4.55 3.87 - 5.11 MIL/uL   Hemoglobin 12.4 12.0 - 15.0 g/dL   HCT 40.5 36.0 - 46.0 %   MCV 89.0 80.0 - 100.0 fL   MCH 27.3 26.0 - 34.0 pg   MCHC 30.6 30.0 - 36.0 g/dL   RDW 13.4 11.5 - 15.5 %   Platelets 253 150 - 400 K/uL   nRBC 0.0 0.0 - 0.2 %    Comment: Performed at [facility], [place] [address] [place], [place] [address]  Comprehensive metabolic panel     Status: Abnormal   Collection Time: 09/21/19  6:43 PM  Result Value Ref Range   Sodium 133 (L) 135 - 145 mmol/L   Potassium 3.0 (L) 3.5 - 5.1 mmol/L   Chloride 95 (L) 98 - 111 mmol/L   CO2 29 22 - 32 mmol/L   Glucose, Bld 304 (H) 70 - 99 mg/dL   BUN 18 8 - 23 mg/dL   Creatinine, Ser 1.34 (H) 0.44 - 1.00 mg/dL   Calcium 8.6 (L) 8.9 - 10.3 mg/dL   Total Protein 6.2 (L) 6.5 - 8.1 g/dL   Albumin 3.1 (L) 3.5 - 5.0 g/dL   AST 12 (L) 15 - 41 U/L   ALT 11 0 - 44 U/L   Alkaline Phosphatase 88 38 - 126 U/L  Total Bilirubin 0.4 0.3 - 1.2 mg/dL   GFR calc non Af Amer 43 (L) >60 mL/min   GFR calc Af Amer 49 (L) >60 mL/min   Anion gap 9 5 - 15    Comment: Performed at [facility], [place] [address], [place], [place] [address]  TSH     Status: None   Collection Time: 09/21/19  6:43 PM  Result Value Ref Range   TSH 0.760 0.350 - 4.500 uIU/mL    Comment: Performed by a 3rd Generation assay with a functional sensitivity of <=0.01 uIU/mL. Performed at [facility], [place] [address], [place], [place] [address]   Glucose, capillary     Status: Abnormal   Collection Time: 09/21/19  8:46 PM  Result Value Ref Range   Glucose-Capillary 267 (H) 70 - 99 mg/dL  Glucose, capillary     Status: Abnormal    Collection Time: 09/22/19  6:50 AM  Result Value Ref Range   Glucose-Capillary 176 (H) 70 - 99 mg/dL  Glucose, capillary     Status: Abnormal   Collection Time: 09/22/19 11:45 AM  Result Value Ref Range   Glucose-Capillary 181 (H) 70 - 99 mg/dL    Blood Alcohol level:  Lab Results  Component Value Date   ETH <10 09/18/2018   ETH <5 62/94/7654    Metabolic Disorder Labs: Lab Results  Component Value Date   HGBA1C 7.1 (A) 01/16/2019   MPG 171.42 12/06/2017   MPG 237 11/02/2015   No results found for: PROLACTIN Lab Results  Component Value Date   CHOL 209 (H) 01/16/2019   TRIG 110 01/16/2019   HDL 48 01/16/2019   CHOLHDL 4.4 01/16/2019   VLDL 37 12/06/2017   LDLCALC 139 (H) 01/16/2019   LDLCALC 20 12/06/2017    Physical Findings: AIMS: Facial and Oral Movements Muscles of Facial Expression: None, normal Lips and Perioral Area: None, normal Jaw: None, normal Tongue: None, normal,Extremity Movements Upper (arms, wrists, hands, fingers): None, normal Lower (legs, knees, ankles, toes): None, normal, Trunk Movements Neck, shoulders, hips: None, normal, Overall Severity Severity of abnormal movements (highest score from questions above): None, normal Incapacitation due to abnormal movements: None, normal Patient's awareness of abnormal movements (rate only patient's report): No Awareness, Dental Status Current problems with teeth and/or dentures?: No Does patient usually wear dentures?: No  CIWA:  CIWA-Ar Total: 0 COWS:  COWS Total Score: 1  Musculoskeletal: Strength & Muscle Tone: within normal limits Gait & Station: normal Patient leans: N/A  Psychiatric Specialty Exam: Physical Exam  Review of Systems no chest pain, no shortness of breath, no vomiting  Blood pressure (!) 142/72, pulse 77, temperature 98.1 F (36.7 C), temperature source Oral, resp. rate 20, SpO2 99 %.There is no height or weight on file to calculate BMI.  General Appearance: Fairly Groomed   Eye Contact:  Fair, improves partially during session  Speech:  Normal Rate  Volume:  Decreased  Mood:  Remains depressed  Affect:  Constricted, smiles briefly at times during session  Thought Process:  Linear and Descriptions of Associations: Intact  Orientation:  Other:  Fully alert and attentive  Thought Content:  No hallucinations, no delusions, does not appear internally preoccupied  Suicidal Thoughts:  No at this time denies suicidal plan or intention and contracts for safety on unit  Homicidal Thoughts:  No  Memory:  Recent and remote grossly intact  Judgement:  Other:  Fair/improving  Insight:  Fair  Psychomotor Activity:  Decreased  Concentration:  Concentration: Good and Attention Span: Good  Recall:  Good  Fund of Knowledge:  Good  Language:  Good  Akathisia:  Negative  Handed:  Right  AIMS (if indicated):     Assets:  Desire for Improvement Resilience  ADL's:  Intact  Cognition:  WNL  Sleep:      Assessment: 63 year old female, presented to hospital with worsening depression, suicidal ideations with thoughts of overdosing, neurovegetative symptoms.  History of cocaine use disorder, had relapsed about 5 months ago following a 28-monthperiod of sobriety.  Had not been taking psychiatric medications times about a month prior to admission.  Patient is presenting with depression, constricted affect.  Affect is somewhat more reactive today, however.  She remains ruminative with regards to substance use/relapse.  Currently denies SI and presents more future oriented.  States she is interested in getting a sponsor following discharge, attending meetings.  CBGs trending down. Denies medication side effects thus far.   Treatment Plan Summary: Daily contact with patient to assess and evaluate symptoms and progress in treatment, Medication management, Plan Inpatient treatment and Medications as below Encourage group and milieu participation Encourage efforts to work on  sobriety and relapse prevention Increase Neurontin to 200 mg 3 times daily for anxiety/pain Continue Lexapro 5 mg daily for depression Continue HCTZ 12.5 mg daily, hydralazine 50 mg twice daily, clonidine 0.1 mg twice daily for hypertension (BP today 142/72) KDUR 20 mEq BID x 3 doses for hypokalemia Continue Lantus insulin 10 units subcutaneously nightly, NovoLog sliding scale with meals and at bedtime for diabetes Continue Plavix 75 mg daily for history of CVA Recheck BMP in AM  FJenne Campus MD 09/22/2019, 5:08 PM

## 2019-09-22 NOTE — [facility] Suicide Risk Assessment (Signed)
[place] INPATIENT:  Family/Significant Other Suicide Prevention Education  Suicide Prevention Education:  Patient Refusal for Family/Significant Other Suicide Prevention Education: The patient Nissa Campopiano has refused to provide written consent for family/significant other to be provided Family/Significant Other Suicide Prevention Education during admission and/or prior to discharge.  Physician notified.  SPE provided to patient directly along with a brochure by Sherren Mocha, [place].  [place] Hun Grossman-Orr 09/22/2019, 6:05 PM

## 2019-09-22 NOTE — Progress Notes (Signed)
[place] NOVEL CORONAVIRUS (COVID-19) DAILY CHECK-OFF SYMPTOMS - answer yes or no to each - every day NO YES  Have you had a fever in the past 24 hours?  . Fever (Temp > 37.80C / 100F) X   Have you had any of these symptoms in the past 24 hours? . New Cough .  Sore Throat  .  Shortness of Breath .  Difficulty Breathing .  Unexplained Body Aches   X   Have you had any one of these symptoms in the past 24 hours not related to allergies?   . Runny Nose .  Nasal Congestion .  Sneezing   X   If you have had runny nose, nasal congestion, sneezing in the past 24 hours, has it worsened?  X   EXPOSURES - check yes or no X   Have you traveled outside the state in the past 14 days?  X   Have you been in contact with someone with a confirmed diagnosis of COVID-19 or PUI in the past 14 days without wearing appropriate PPE?  X   Have you been living in the same home as a person with confirmed diagnosis of COVID-19 or a PUI (household contact)?    X   Have you been diagnosed with COVID-19?    X              What to do next: Answered NO to all: Answered YES to anything:   Proceed with unit schedule Follow the [facility] Inpatient Flowsheet.   

## 2019-09-22 NOTE — Progress Notes (Signed)
Patient ID: Lisa Anderson, female   DOB: [birth year], 63 y.o.   MRN: [id number] DAR Note: Pt with blunted affect, depressed mood, denied SI/HI/AVH earlier in shift, Q15 minute checks for safety in place, meds given as ordered, pt in bed and seems to be sleeping with no signs/symptoms of distress.

## 2019-09-23 LAB — BASIC METABOLIC PANEL
Anion gap: 11 (ref 5–15)
BUN: 18 mg/dL (ref 8–23)
CO2: 28 mmol/L (ref 22–32)
Calcium: 9.1 mg/dL (ref 8.9–10.3)
Chloride: 101 mmol/L (ref 98–111)
Creatinine, Ser: 1.28 mg/dL — ABNORMAL HIGH (ref 0.44–1.00)
GFR calc Af Amer: 52 mL/min — ABNORMAL LOW (ref >60)
GFR calc non Af Amer: 45 mL/min — ABNORMAL LOW (ref >60)
Glucose, Bld: 135 mg/dL — ABNORMAL HIGH (ref 70–99)
Potassium: 3.4 mmol/L — ABNORMAL LOW (ref 3.5–5.1)
Sodium: 140 mmol/L (ref 135–145)

## 2019-09-23 LAB — GLUCOSE, CAPILLARY
Glucose-Capillary: 118 mg/dL — ABNORMAL HIGH (ref 70–99)
Glucose-Capillary: 225 mg/dL — ABNORMAL HIGH (ref 70–99)
Glucose-Capillary: 274 mg/dL — ABNORMAL HIGH (ref 70–99)
Glucose-Capillary: 300 mg/dL — ABNORMAL HIGH (ref 70–99)

## 2019-09-23 MED ORDER — GABAPENTIN 300 MG PO CAPS
300.0000 mg | ORAL_CAPSULE | Freq: Three times a day (TID) | ORAL | Status: DC
  Administered 2019-09-23 – 2019-09-25 (×7): 300 mg via ORAL
  Filled 2019-09-23: qty 21
  Filled 2019-09-23 (×3): qty 1
  Filled 2019-09-23 (×2): qty 21
  Filled 2019-09-23 (×6): qty 1

## 2019-09-23 MED ORDER — ESCITALOPRAM OXALATE 10 MG PO TABS
10.0000 mg | ORAL_TABLET | Freq: Every day | ORAL | Status: DC
  Administered 2019-09-24 – 2019-09-25 (×2): 10 mg via ORAL
  Filled 2019-09-23: qty 7
  Filled 2019-09-23 (×3): qty 1

## 2019-09-23 NOTE — Tx Team (Signed)
Interdisciplinary Treatment and Diagnostic Plan Update  09/23/2019 Time of Session:  Lisa Anderson MRN: [id number]  Principal Diagnosis: <principal problem not specified>  Secondary Diagnoses: Active Problems:   MDD (major depressive disorder)   Cocaine dependence with cocaine-induced mood disorder (HCC)   Current Medications:  Current Facility-Administered Medications  Medication Dose Route Frequency Provider Last Rate Last Admin  . acetaminophen (TYLENOL) tablet 650 mg  650 mg Oral Q6H PRN Derrill Center, NP   650 mg at 09/22/19 1047  . albuterol (VENTOLIN HFA) 108 (90 Base) MCG/ACT inhaler 1-2 puff  1-2 puff Inhalation Q6H PRN Derrill Center, NP      . alum & mag hydroxide-simeth (MAALOX/MYLANTA) 200-200-20 MG/5ML suspension 30 mL  30 mL Oral Q4H PRN Derrill Center, NP      . cloNIDine (CATAPRES) tablet 0.1 mg  0.1 mg Oral BID Derrill Center, NP   0.1 mg at 09/23/19 0830  . clopidogrel (PLAVIX) tablet 75 mg  75 mg Oral Daily Derrill Center, NP   75 mg at 09/23/19 0849  . [START ON 09/24/2019] escitalopram (LEXAPRO) tablet 10 mg  10 mg Oral Daily Connye Burkitt, NP      . gabapentin (NEURONTIN) capsule 300 mg  300 mg Oral TID Connye Burkitt, NP      . hydrALAZINE (APRESOLINE) tablet 50 mg  50 mg Oral BID Derrill Center, NP   50 mg at 09/23/19 3419  . hydrochlorothiazide (MICROZIDE) capsule 12.5 mg  12.5 mg Oral Daily Derrill Center, NP   12.5 mg at 09/23/19 3790  . hydrOXYzine (ATARAX/VISTARIL) tablet 25 mg  25 mg Oral TID PRN Derrill Center, NP   25 mg at 09/23/19 2409  . insulin aspart (novoLOG) injection 0-5 Units  0-5 Units Subcutaneous QHS Derrill Center, NP   2 Units at 09/22/19 2211  . insulin aspart (novoLOG) injection 0-9 Units  0-9 Units Subcutaneous TID WC Derrill Center, NP   5 Units at 09/22/19 1740  . insulin glargine (LANTUS) injection 10 Units  10 Units Subcutaneous QHS Cobos, Myer Peer, MD   10 Units at 09/22/19 2212  . magnesium hydroxide (MILK OF  MAGNESIA) suspension 30 mL  30 mL Oral Daily PRN Derrill Center, NP      . nicotine (NICODERM CQ - dosed in mg/24 hr) patch 7 mg  7 mg Transdermal Daily Derrill Center, NP   7 mg at 09/23/19 0849  . potassium chloride (KLOR-CON) CR tablet 10 mEq  10 mEq Oral BID Cobos, Myer Peer, MD   10 mEq at 09/23/19 0829  . rosuvastatin (CRESTOR) tablet 40 mg  40 mg Oral q1800 Connye Burkitt, NP   40 mg at 09/22/19 1740  . traZODone (DESYREL) tablet 50 mg  50 mg Oral QHS PRN Derrill Center, NP       PTA Medications: Medications Prior to Admission  Medication Sig Dispense Refill Last Dose  . albuterol (VENTOLIN HFA) 108 (90 Base) MCG/ACT inhaler Inhale 2 puffs into the lungs every 6 (six) hours as needed for wheezing or shortness of breath. Shortness of breath 1 Inhaler 3 Unknown at Unknown time  . Blood Glucose Monitoring Suppl (CONTOUR NEXT MONITOR) w/Device KIT 1 strip 3 (three) times daily by Does not apply route. E11.9 1 kit 0 Unknown at Unknown time  . Cholecalciferol (VITAMIN D PO) Take 1-2 tablets by mouth daily.   Unknown at Unknown time  . cloNIDine (CATAPRES) 0.1 MG tablet  Take 1 tablet (0.1 mg total) by mouth 2 (two) times daily in the am and at bedtime.. 60 tablet 11 Unknown at Unknown time  . clopidogrel (PLAVIX) 75 MG tablet Take 1 tablet (75 mg total) by mouth daily. 90 tablet 1 Unknown at Unknown time  . diltiazem (CARDIZEM CD) 240 MG 24 hr capsule Take 1 capsule (240 mg total) by mouth daily. 90 capsule 1 Unknown at Unknown time  . gabapentin (NEURONTIN) 600 MG tablet Take 1 tablet (600 mg total) by mouth 2 (two) times daily. 60 tablet 0 Unknown at Unknown time  . glipiZIDE (GLUCOTROL) 10 MG tablet Take 1 tablet (10 mg total) by mouth 2 (two) times daily. 180 tablet 1 Unknown at Unknown time  . glucose blood (CONTOUR NEXT TEST) test strip Use as instructed 3 times daily. E11.9 100 each 12 Unknown at Unknown time  . hydrALAZINE (APRESOLINE) 50 MG tablet Take 1 tablet (50 mg total) by mouth 2  (two) times daily. 60 tablet 4 Unknown at Unknown time  . hydrochlorothiazide (HYDRODIURIL) 12.5 MG tablet Take 1 tablet (12.5 mg total) by mouth daily. 90 tablet 1 Unknown at Unknown time  . insulin aspart (NOVOLOG) 100 UNIT/ML injection Sliding scale; max 9 units TID 10 mL 3 Unknown at Unknown time  . insulin glargine (LANTUS) 100 UNIT/ML injection Inject 0.13 mLs (13 Units total) into the skin at bedtime. 10 mL 4 Unknown at Unknown time  . nicotine (NICODERM CQ) 7 mg/24hr patch Place 1 patch (7 mg total) onto the skin daily. 28 patch 0 Unknown at Unknown time  . potassium chloride SA (KLOR-CON M20) 20 MEQ tablet Take 1 tablet (20 mEq total) by mouth daily. 90 tablet 1 Unknown at Unknown time  . rosuvastatin (CRESTOR) 40 MG tablet Take 1 tablet (40 mg total) by mouth daily. 90 tablet 1 Unknown at Unknown time  . traZODone (DESYREL) 50 MG tablet Take 1 tablet (50 mg total) by mouth at bedtime. 30 tablet 0 Unknown at Unknown time    Patient Stressors: Financial difficulties Substance abuse  Patient Strengths: Work Counselling psychologist Modalities: Medication Management, Group therapy, Case management,  1 to 1 session with clinician, Psychoeducation, Recreational therapy.   Physician Treatment Plan for Primary Diagnosis: <principal problem not specified> Long Term Goal(s): Improvement in symptoms so as ready for discharge Improvement in symptoms so as ready for discharge   Short Term Goals: Ability to identify changes in lifestyle to reduce recurrence of condition will improve Ability to verbalize feelings will improve Ability to disclose and discuss suicidal ideas Ability to demonstrate self-control will improve Ability to identify and develop effective coping behaviors will improve  Medication Management: Evaluate patient's response, side effects, and tolerance of medication regimen.  Therapeutic Interventions: 1 to 1 sessions, Unit Group sessions and Medication  administration.  Evaluation of Outcomes: Not Met  Physician Treatment Plan for Secondary Diagnosis: Active Problems:   MDD (major depressive disorder)   Cocaine dependence with cocaine-induced mood disorder ([place])  Long Term Goal(s): Improvement in symptoms so as ready for discharge Improvement in symptoms so as ready for discharge   Short Term Goals: Ability to identify changes in lifestyle to reduce recurrence of condition will improve Ability to verbalize feelings will improve Ability to disclose and discuss suicidal ideas Ability to demonstrate self-control will improve Ability to identify and develop effective coping behaviors will improve     Medication Management: Evaluate patient's response, side effects, and tolerance of medication regimen.  Therapeutic Interventions: 1 to  1 sessions, Unit Group sessions and Medication administration.  Evaluation of Outcomes: Not Met   RN Treatment Plan for Primary Diagnosis: <principal problem not specified> Long Term Goal(s): Knowledge of disease and therapeutic regimen to maintain health will improve  Short Term Goals: Ability to verbalize feelings will improve, Ability to disclose and discuss suicidal ideas, Ability to identify and develop effective coping behaviors will improve and Compliance with prescribed medications will improve  Medication Management: RN will administer medications as ordered by provider, will assess and evaluate patient's response and provide education to patient for prescribed medication. RN will report any adverse and/or side effects to prescribing provider.  Therapeutic Interventions: 1 on 1 counseling sessions, Psychoeducation, Medication administration, Evaluate responses to treatment, Monitor vital signs and CBGs as ordered, Perform/monitor CIWA, COWS, AIMS and Fall Risk screenings as ordered, Perform wound care treatments as ordered.  Evaluation of Outcomes: Not Met   LCSW Treatment Plan for Primary  Diagnosis: <principal problem not specified> Long Term Goal(s): Safe transition to appropriate next level of care at discharge, Engage patient in therapeutic group addressing interpersonal concerns.  Short Term Goals: Engage patient in aftercare planning with referrals and resources  Therapeutic Interventions: Assess for all discharge needs, 1 to 1 time with Social worker, Explore available resources and support systems, Assess for adequacy in community support network, Educate family and significant other(s) on suicide prevention, Complete Psychosocial Assessment, Interpersonal group therapy.  Evaluation of Outcomes: Not Met   Progress in Treatment: Attending groups: Yes. Participating in groups: Yes. Taking medication as prescribed: Yes. Toleration medication: Yes. Family/Significant other contact made: No, will contact:  no one, patient declined consent for collateral contacts Patient understands diagnosis: Yes. Discussing patient identified problems/goals with staff: Yes. Medical problems stabilized or resolved: Yes. Denies suicidal/homicidal ideation: Yes. Issues/concerns per patient self-inventory: No. Other:   New problem(s) identified: None   New Short Term/Long Term Goal(s): medication stabilization, elimination of SI thoughts, development of comprehensive mental wellness plan.    Patient Goals:    Discharge Plan or Barriers: Patient recently admitted. CSW will continue to follow and assess for appropriate referrals and possible discharge planning.    Reason for Continuation of Hospitalization: Anxiety Depression Medication stabilization Suicidal ideation  Estimated Length of Stay: 3-5 days   Attendees: Patient: 09/23/2019 9:16 AM  Physician: Dr. Neita Garnet, MD 09/23/2019 9:16 AM  Nursing: Rise Paganini.Raliegh Ip, RN 09/23/2019 9:16 AM  RN Care Manager: 09/23/2019 9:16 AM  Social Worker: Radonna Ricker, LCSW 09/23/2019 9:16 AM  Recreational Therapist:  09/23/2019 9:16 AM   Other:  09/23/2019 9:16 AM  Other:  09/23/2019 9:16 AM  Other: 09/23/2019 9:16 AM    Scribe for Treatment Team: Marylee Floras, [place] 09/23/2019 9:16 AM

## 2019-09-23 NOTE — Plan of Care (Signed)
Nurse discussed SI and coping skills with patient who contracted for safety.

## 2019-09-23 NOTE — Progress Notes (Addendum)
A referral for residential substance use treatment was faxed on behalf of this patient to [facility] in [place], [place].  [facility] Authorization: [id number] Authorization Valid: 09/23/19 through 09/29/19  Faxed to: [phone]    3:20pm Sharee Pimple in admissions confirms receipt of referral. Referral has not been reviewed at this time as admissions staff is not on site today. Referral will be reviewed as soon as tomorrow (12/29).  Stephanie Acre, LCSW-A Clinical Social Worker

## 2019-09-23 NOTE — B[facility] Counselor (Signed)
CSW attempted to reach admissions at [facility] in [place] twice at [phone]. The calls were unanswered and the intake coordinator's voicemail was unable to receive new messages.  CSW will follow up with patient regarding other treatment programs or shelter beds if needed.  Stephanie Acre, MSW, [place] Social Worker [facility]  [phone]

## 2019-09-23 NOTE — Progress Notes (Signed)
Recreation Therapy Notes  Date:  12.2820 Time: 0930 Location: [address] Hall Dayroom  Group Topic: Stress Management  Goal Area(s) Addresses:  Patient will identify positive stress management techniques. Patient will identify benefits of using stress management post d/c.  Intervention:  Stress Management  Activity :  Meditation.  LRT played a meditation the focused on pure possibility.  The meditation lead patients to focus on something they want to accomplish during they day.  Patients were to listen to the meditation and follow along as the meditation played to engage.  Education:  Stress Management, Discharge Planning.   Education Outcome: Acknowledges Education  Clinical Observations/Feedback:  Pt did not attend activity.     Victorino Sparrow, LRT/CTRS         Victorino Sparrow A 09/23/2019 11:46 AM

## 2019-09-23 NOTE — Progress Notes (Addendum)
[facility] MD Progress Note  09/23/2019 8:20 AM Lisa Anderson  MRN:  [id number] Subjective:  "My nerves are bad."  Lisa Anderson found lying in bed. She presents with anxious, tearful affect and states mood is unchanged. She reports hopelessness related to her relapse and hospitalization- "I just can't stay away from the drugs." She maintained sobriety for 6 months after last hospitalization, after doing rehab at [facility], [place], and then moving back to her hometown to stay with her brother. She had to leave her brother's home when he took in his grandchildren. She came back to [place] to stay with her daughter again, began working as a Quarry manager, and relapsed on cocaine. She states she had not been continuing with AA meetings- "I forgot that I was an addict." She is unsure if her daughter will allow her back home. She is wanting to go to rehab. She expresses hopelessness when asked to identify skills/resources used during past periods of sobriety. She admits to continuing cravings for cocaine. She also reports continuing SI with thoughts of banging her head against the wall. She denies suicidal intent and contracts for safety on the unit. Denies HI/AVH. She reports poor sleep overnight. 6.5 hours of sleep recorded. Per chart review, she did not take PRN trazodone last night. Labs reviewed. Morning CBG 118. Potassium improving to 3.4 with supplementation. Creatinine decreased from 1.34 to 1.28 this morning (1.28 in July 2020).  From admission H&P: Lisa Anderson is a 63 year old female with history of cocaine use disorder, depression, HTN, and diabetes, who presented to [facility] as a walk-in for cocaine dependence with depression and suicidal ideation. Patient was admitted to [facility] in December 2019 for similar presentation after her daughter had thrown her out of the house for cocaine use. The patient reports six months of sobriety after last admission but relapsed on cocaine in May 2020.  Principal Problem: <principal  problem not specified> Diagnosis: Active Problems:   MDD (major depressive disorder)   Cocaine dependence with cocaine-induced mood disorder ([place])  Total Time spent with patient: 20 minutes  Past Psychiatric History: See admission H&P  Past Medical History:  Past Medical History:  Diagnosis Date  . Asthma   . Cancer ([place])    breast  . Chronic congestive heart failure with left ventricular diastolic dysfunction ([place])   . Diabetes mellitus   . Hyperlipidemia   . Hypertension   . Stroke [facility])     Past Surgical History:  Procedure Laterality Date  . BREAST LUMPECTOMY    . EP IMPLANTABLE DEVICE N/A 08/07/2015   Procedure: Loop Recorder Insertion;  Surgeon: Thompson Grayer, MD;  Location: [place] CV LAB;  Service: Cardiovascular;  Laterality: N/A;  . TEE WITHOUT CARDIOVERSION N/A 08/07/2015   Procedure: TRANSESOPHAGEAL ECHOCARDIOGRAM (TEE);  Surgeon: Sueanne Margarita, MD;  Location: [facility] ENDOSCOPY;  Service: Cardiovascular;  Laterality: N/A;  . TONSILLECTOMY     Family History:  Family History  Problem Relation Age of Onset  . Diabetes Mother   . Hyperlipidemia Mother   . Hypertension Mother   . Breast cancer Mother   . Rheum arthritis Father   . Asthma Father   . Stroke Father   . Thyroid disease Father   . Thyroid disease Brother   . Colon cancer Brother        in his 11s  . Breast cancer Sister    Family Psychiatric  History: See admission H&P Social History:  Social History   Substance and Sexual Activity  Alcohol Use  Yes  . Alcohol/week: 6.0 standard drinks  . Types: 6 Cans of beer per week   Comment: occasionally     Social History   Substance and Sexual Activity  Drug Use Not Currently  . Types: "Crack" cocaine, Cocaine   Comment: last used a year ago    Social History   Socioeconomic History  . Marital status: Single    Spouse name: Not on file  . Number of children: 5  . Years of education: 48  . Highest education level: Not on file  Occupational  History  . Occupation: CNA- [employer]  Tobacco Use  . Smoking status: Current Every Day Smoker    Packs/day: 0.25    Types: Cigarettes  . Smokeless tobacco: Never Used  . Tobacco comment: pt smokes 8 per day  Substance and Sexual Activity  . Alcohol use: Yes    Alcohol/week: 6.0 standard drinks    Types: 6 Cans of beer per week    Comment: occasionally  . Drug use: Not Currently    Types: "Crack" cocaine, Cocaine    Comment: last used a year ago  . Sexual activity: Never    Comment: recovering cocaine addict x6 months  Other Topics Concern  . Not on file  Social History Narrative   Lives with daughter      Caffeine use: Drinks soda (2- 20 oz soda per day)   Social Determinants of Health   Financial Resource Strain:   . Difficulty of Paying Living Expenses: Not on file  Food Insecurity:   . Worried About Charity fundraiser in the Last Year: Not on file  . Ran Out of Food in the Last Year: Not on file  Transportation Needs:   . Lack of Transportation (Medical): Not on file  . Lack of Transportation (Non-Medical): Not on file  Physical Activity:   . Days of Exercise per Week: Not on file  . Minutes of Exercise per Session: Not on file  Stress:   . Feeling of Stress : Not on file  Social Connections:   . Frequency of Communication with Friends and Family: Not on file  . Frequency of Social Gatherings with Friends and Family: Not on file  . Attends Religious Services: Not on file  . Active Member of Clubs or Organizations: Not on file  . Attends Archivist Meetings: Not on file  . Marital Status: Not on file   Additional Social History:    Pain Medications: Please see MAR Prescriptions: Please see MAR Over the Counter: Please see MAR History of alcohol / drug use?: Yes                    Sleep: Fair  Appetite:  Good  Current Medications: Current Facility-Administered Medications  Medication Dose Route Frequency Provider Last  Rate Last Admin  . acetaminophen (TYLENOL) tablet 650 mg  650 mg Oral Q6H PRN Derrill Center, NP   650 mg at 09/22/19 1047  . albuterol (VENTOLIN HFA) 108 (90 Base) MCG/ACT inhaler 1-2 puff  1-2 puff Inhalation Q6H PRN Derrill Center, NP      . alum & mag hydroxide-simeth (MAALOX/MYLANTA) 200-200-20 MG/5ML suspension 30 mL  30 mL Oral Q4H PRN Derrill Center, NP      . cloNIDine (CATAPRES) tablet 0.1 mg  0.1 mg Oral BID Derrill Center, NP   0.1 mg at 09/22/19 1747  . clopidogrel (PLAVIX) tablet 75 mg  75 mg Oral  Daily Derrill Center, NP   75 mg at 09/22/19 0827  . [START ON 09/24/2019] escitalopram (LEXAPRO) tablet 10 mg  10 mg Oral Daily Connye Burkitt, NP      . gabapentin (NEURONTIN) capsule 300 mg  300 mg Oral TID Connye Burkitt, NP      . hydrALAZINE (APRESOLINE) tablet 50 mg  50 mg Oral BID Derrill Center, NP   50 mg at 09/22/19 1747  . hydrochlorothiazide (MICROZIDE) capsule 12.5 mg  12.5 mg Oral Daily Derrill Center, NP   12.5 mg at 09/22/19 0827  . hydrOXYzine (ATARAX/VISTARIL) tablet 25 mg  25 mg Oral TID PRN Derrill Center, NP   25 mg at 09/21/19 1048  . insulin aspart (novoLOG) injection 0-5 Units  0-5 Units Subcutaneous QHS Derrill Center, NP   2 Units at 09/22/19 2211  . insulin aspart (novoLOG) injection 0-9 Units  0-9 Units Subcutaneous TID WC Derrill Center, NP   5 Units at 09/22/19 1740  . insulin glargine (LANTUS) injection 10 Units  10 Units Subcutaneous QHS Lawsyn Heiler, Myer Peer, MD   10 Units at 09/22/19 2212  . magnesium hydroxide (MILK OF MAGNESIA) suspension 30 mL  30 mL Oral Daily PRN Derrill Center, NP      . nicotine (NICODERM CQ - dosed in mg/24 hr) patch 7 mg  7 mg Transdermal Daily Derrill Center, NP   7 mg at 09/22/19 [id number]  . potassium chloride (KLOR-CON) CR tablet 10 mEq  10 mEq Oral BID Preeti Winegardner, Myer Peer, MD   10 mEq at 09/22/19 2212  . rosuvastatin (CRESTOR) tablet 40 mg  40 mg Oral q1800 Connye Burkitt, NP   40 mg at 09/22/19 1740  . traZODone (DESYREL)  tablet 50 mg  50 mg Oral QHS PRN Derrill Center, NP        Lab Results:  Results for orders placed or performed during the hospital encounter of 09/20/19 (from the past 48 hour(s))  Glucose, capillary     Status: Abnormal   Collection Time: 09/21/19 11:55 AM  Result Value Ref Range   Glucose-Capillary 264 (H) 70 - 99 mg/dL  Glucose, capillary     Status: Abnormal   Collection Time: 09/21/19  4:48 PM  Result Value Ref Range   Glucose-Capillary 283 (H) 70 - 99 mg/dL   Comment 1 Notify RN    Comment 2 Document in Chart   CBC     Status: None   Collection Time: 09/21/19  6:43 PM  Result Value Ref Range   WBC 6.5 4.0 - 10.5 K/uL   RBC 4.55 3.87 - 5.11 MIL/uL   Hemoglobin 12.4 12.0 - 15.0 g/dL   HCT 40.5 36.0 - 46.0 %   MCV 89.0 80.0 - 100.0 fL   MCH 27.3 26.0 - 34.0 pg   MCHC 30.6 30.0 - 36.0 g/dL   RDW 13.4 11.5 - 15.5 %   Platelets 253 150 - 400 K/uL   nRBC 0.0 0.0 - 0.2 %    Comment: Performed at [facility], [place] [address] [place],  [address]  Comprehensive metabolic panel     Status: Abnormal   Collection Time: 09/21/19  6:43 PM  Result Value Ref Range   Sodium 133 (L) 135 - 145 mmol/L   Potassium 3.0 (L) 3.5 - 5.1 mmol/L   Chloride 95 (L) 98 - 111 mmol/L   CO2 29 22 - 32 mmol/L   Glucose, Bld  304 (H) 70 - 99 mg/dL   BUN 18 8 - 23 mg/dL   Creatinine, Ser 1.34 (H) 0.44 - 1.00 mg/dL   Calcium 8.6 (L) 8.9 - 10.3 mg/dL   Total Protein 6.2 (L) 6.5 - 8.1 g/dL   Albumin 3.1 (L) 3.5 - 5.0 g/dL   AST 12 (L) 15 - 41 U/L   ALT 11 0 - 44 U/L   Alkaline Phosphatase 88 38 - 126 U/L   Total Bilirubin 0.4 0.3 - 1.2 mg/dL   GFR calc non Af Amer 43 (L) >60 mL/min   GFR calc Af Amer 49 (L) >60 mL/min   Anion gap 9 5 - 15    Comment: Performed at [facility], [place] [address], [place], [place] [address]  TSH     Status: None   Collection Time: 09/21/19  6:43 PM  Result Value Ref Range   TSH 0.760 0.350 - 4.500 uIU/mL    Comment:  Performed by a 3rd Generation assay with a functional sensitivity of <=0.01 uIU/mL. Performed at [facility], [place] [address] [place], [place] [address]   Glucose, capillary     Status: Abnormal   Collection Time: 09/21/19  8:46 PM  Result Value Ref Range   Glucose-Capillary 267 (H) 70 - 99 mg/dL  Glucose, capillary     Status: Abnormal   Collection Time: 09/22/19  6:50 AM  Result Value Ref Range   Glucose-Capillary 176 (H) 70 - 99 mg/dL  Glucose, capillary     Status: Abnormal   Collection Time: 09/22/19 11:45 AM  Result Value Ref Range   Glucose-Capillary 181 (H) 70 - 99 mg/dL  Glucose, capillary     Status: Abnormal   Collection Time: 09/22/19  5:21 PM  Result Value Ref Range   Glucose-Capillary 254 (H) 70 - 99 mg/dL  Hemoglobin A1c     Status: Abnormal   Collection Time: 09/22/19  5:53 PM  Result Value Ref Range   Hgb A1c MFr Bld 9.3 (H) 4.8 - 5.6 %    Comment: (NOTE) Pre diabetes:          5.7%-6.4% Diabetes:              >6.4% Glycemic control for   <7.0% adults with diabetes    Mean Plasma Glucose 220.21 mg/dL    Comment: Performed at [facility], [place] [address] [place], [place] [address]  Lipid panel     Status: Abnormal   Collection Time: 09/22/19  5:53 PM  Result Value Ref Range   Cholesterol 211 (H) 0 - 200 mg/dL   Triglycerides 185 (H) <150 mg/dL   HDL 41 >40 mg/dL   Total CHOL/HDL Ratio 5.1 RATIO   VLDL 37 0 - 40 mg/dL   LDL Cholesterol 133 (H) 0 - 99 mg/dL    Comment:        Total Cholesterol/HDL:CHD Risk Coronary Heart Disease Risk Table                     Men   Women  1/2 Average Risk   3.4   3.3  Average Risk       5.0   4.4  2 X Average Risk   9.6   7.1  3 X Average Risk  23.4   11.0        Use the calculated Patient Ratio above and the CHD Risk Table to determine the patient's CHD Risk.  ATP III CLASSIFICATION (LDL):  <100     mg/dL   Optimal  100-129  mg/dL   Near or Above                    Optimal   130-159  mg/dL   Borderline  160-189  mg/dL   High  >190     mg/dL   Very High Performed at [facility] [address], [place], [place] [address]   Glucose, capillary     Status: Abnormal   Collection Time: 09/22/19  7:45 PM  Result Value Ref Range   Glucose-Capillary 207 (H) 70 - 99 mg/dL  Glucose, capillary     Status: Abnormal   Collection Time: 09/23/19  6:00 AM  Result Value Ref Range   Glucose-Capillary 118 (H) 70 - 99 mg/dL  Basic metabolic panel     Status: Abnormal   Collection Time: 09/23/19  6:38 AM  Result Value Ref Range   Sodium 140 135 - 145 mmol/L    Comment: DELTA CHECK NOTED   Potassium 3.4 (L) 3.5 - 5.1 mmol/L   Chloride 101 98 - 111 mmol/L   CO2 28 22 - 32 mmol/L   Glucose, Bld 135 (H) 70 - 99 mg/dL   BUN 18 8 - 23 mg/dL   Creatinine, Ser 1.28 (H) 0.44 - 1.00 mg/dL   Calcium 9.1 8.9 - 10.3 mg/dL   GFR calc non Af Amer 45 (L) >60 mL/min   GFR calc Af Amer 52 (L) >60 mL/min   Anion gap 11 5 - 15    Comment: Performed at [facility], [place] [address], [place], [place] [address]    Blood Alcohol level:  Lab Results  Component Value Date   ETH <10 09/18/2018   ETH <5 [id number]    Metabolic Disorder Labs: Lab Results  Component Value Date   HGBA1C 9.3 (H) 09/22/2019   MPG 220.21 09/22/2019   MPG 171.42 12/06/2017   No results found for: PROLACTIN Lab Results  Component Value Date   CHOL 211 (H) 09/22/2019   TRIG 185 (H) 09/22/2019   HDL 41 09/22/2019   CHOLHDL 5.1 09/22/2019   VLDL 37 09/22/2019   LDLCALC 133 (H) 09/22/2019   LDLCALC 139 (H) 01/16/2019    Physical Findings: AIMS: Facial and Oral Movements Muscles of Facial Expression: None, normal Lips and Perioral Area: None, normal Jaw: None, normal Tongue: None, normal,Extremity Movements Upper (arms, wrists, hands, fingers): None, normal Lower (legs, knees, ankles, toes): None, normal, Trunk Movements Neck, shoulders, hips: None, normal,  Overall Severity Severity of abnormal movements (highest score from questions above): None, normal Incapacitation due to abnormal movements: None, normal Patient's awareness of abnormal movements (rate only patient's report): No Awareness, Dental Status Current problems with teeth and/or dentures?: No Does patient usually wear dentures?: No  CIWA:  CIWA-Ar Total: 0 COWS:  COWS Total Score: 1  Musculoskeletal: Strength & Muscle Tone: within normal limits Gait & Station: normal Patient leans: N/A  Psychiatric Specialty Exam: Physical Exam  Nursing note and vitals reviewed. Constitutional: She is oriented to person, place, and time. She appears well-developed and well-nourished.  Cardiovascular: Normal rate.  Respiratory: Effort normal.  Neurological: She is alert and oriented to person, place, and time.    Review of Systems  Constitutional: Negative.   Respiratory: Negative for cough and shortness of breath.   Gastrointestinal: Negative for diarrhea, nausea and vomiting.  Psychiatric/Behavioral: Positive for dysphoric mood, sleep disturbance  and suicidal ideas. Negative for agitation, behavioral problems, hallucinations and self-injury. The patient is nervous/anxious. The patient is not hyperactive.     Blood pressure (!) 158/80, pulse 76, temperature 98.4 F (36.9 C), resp. rate 16, SpO2 100 %.There is no height or weight on file to calculate BMI.  General Appearance: Fairly Groomed  Eye Contact:  Minimal  Speech:  Slow  Volume:  Decreased  Mood:  Anxious and Depressed  Affect:  Congruent  Thought Process:  Coherent  Orientation:  Full (Time, Place, and Person)  Thought Content:  Logical  Suicidal Thoughts:  Yes.  without intent/plan  Homicidal Thoughts:  No  Memory:  Immediate;   Fair Recent;   Fair Remote;   Fair  Judgement:  Intact  Insight:  Fair  Psychomotor Activity:  Normal  Concentration:  Concentration: Fair and Attention Span: Fair  Recall:  [employer] of  Knowledge:  Fair  Language:  Fair  Akathisia:  No  Handed:  Right  AIMS (if indicated):     Assets:  Communication Skills Desire for Improvement Resilience  ADL's:  Intact  Cognition:  WNL  Sleep:  Number of Hours: 6.5     Treatment Plan Summary: Daily contact with patient to assess and evaluate symptoms and progress in treatment and Medication management   Continue inpatient hospitalization.  Increase Lexapro to 10 mg PO daily for depression/anxiety Increase gabapentin to 300 mg PO TID for anxiety Continue Vistaril 25 mg PO TID PRN anxiety Continue clonidine 0.1 mg PO BID for HTN Continue Plavix 75 mg PO daily for antiplatelet Continue hydralazine 50 mg PO BID for HTN Continue HCTZ 12.5 mg PO daily for HTN Continue Lantus 10 units SQ QHS for diabetes Continue sliding scale insulin ACHS for diabetes Continue Crestor 40 mg PO daily for HLD Continue K-Dur 10 mEq for 4 doses for hypokalemia Continue trazodone 50 mg PO QHS PRN insomnia  Patient will participate in the therapeutic group milieu.  Discharge disposition in progress.   Connye Burkitt, NP 09/23/2019, 8:20 AM   Attest to NP Note

## 2019-09-23 NOTE — BHH Counselor (Signed)
CSW met with patient on unit to discuss referrals and follow up. Patient expressed interest to discharge to [facility] in [place]. CSW has attempted to reach admissions staff three times, the voicemail is full. CSW provided contact information for [facility] to patient as well and encouraged patient to call.  Patient is unfortunately, unable to be referred to [place] for residential substance use treatment as her diabetes is managed with insulin.  Patient states she is not interested in referrals to [facility] as their campus is spread out and she did not like walking from building to building. 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  Patient inquired about treatment at [facility] in [place], however, patient would not be able to arrange transportation, so this referral will not be pursued.   Patient is agreeable to a referral for residential substance use treatment at [facility] in [place]. Patient informed that there may be a waitlist for treatment, and patient may be unable to go door-to-door to treatment.   Patient reported she did not want to be discharged without a place to go, CSW provided patient with shelter listings and [facility]. CSW encouraged patient to call [facility] this afternoon and to leave a detailed voicemail with call back information and her code.  CSW faxed referral for treatment to [facility]. CSW following for disposition.  Stephanie Acre, MSW, [place] Social Worker [facility] Adult Unit  [phone]

## 2019-09-23 NOTE — BHH Group Notes (Signed)
LCSW Group Therapy Notes  Type of Therapy and Topic: Group Therapy: Core Beliefs  Participation Level: Active  Description of Group: In this group patients will be encouraged to explore their negative and positive core beliefs about themselves, others, and the world. Each patient will be challenged to identify these beliefs and ways to challenge negative core beliefs. This group will be process-oriented, with patients participating in exploration of their own experiences as well as giving and receiving support and challenge from other group members.  Therapeutic Goals: 1. Patient will identify personal core beliefs, both negative and positive. 2. Patient will identify core beliefs relating to others, both negative and positive. 3. Patient will challenge their negative beliefs about themselves and others. 4. Patient will identify three changes they can make to replace negative core beliefs with positive beliefs.  Summary of Patient Progress  Due to the COVID-19 pandemic, this group has been supplemented with worksheets.  Therapeutic Modalities: Cognitive Behavioral Therapy Solution Focused Therapy Motivational Interviewing

## 2019-09-23 NOTE — Progress Notes (Signed)
Spiritual care group on grief and loss facilitated by chaplain Jerene Pitch MDiv, [facility]  Group Goal:  Support / Education around grief and loss Members engage in facilitated group support and psycho-social education.  Group Description:  Following introductions and group rules, group members engaged in facilitated group dialog and support around topic of loss, with particular support around experiences of loss in their lives. Group Identified types of loss (relationships / self / things) and identified patterns, circumstances, and changes that precipitate losses. Reflected on thoughts / feelings around loss, normalized grief responses, and recognized variety in grief experience.   Group noted Worden's four tasks of grief in discussion.  Group drew on Adlerian / Rogerian, narrative, MI, Patient Progress:  Invited.  Did not attend

## 2019-09-23 NOTE — Progress Notes (Signed)
D:  Patient denied HI.  Denied A/V hallucinations.  Denied pain.  Patient stated she does have SI thoughts at times, no plan while at [facility], contracts for safety.  Patient also said she has thought about hitting her head against a wall but she would not hurt herself at [facility]. A:  Medications administered per MD orders.  Emotional support and encouragement given patient. R:  Safety maintained with 15 minute checks.

## 2019-09-24 LAB — GLUCOSE, CAPILLARY
Glucose-Capillary: 122 mg/dL — ABNORMAL HIGH (ref 70–99)
Glucose-Capillary: 190 mg/dL — ABNORMAL HIGH (ref 70–99)
Glucose-Capillary: 261 mg/dL — ABNORMAL HIGH (ref 70–99)
Glucose-Capillary: 341 mg/dL — ABNORMAL HIGH (ref 70–99)

## 2019-09-24 MED ORDER — INSULIN GLARGINE 100 UNIT/ML ~~LOC~~ SOLN
15.0000 [IU] | Freq: Every day | SUBCUTANEOUS | Status: DC
  Administered 2019-09-24: 22:00:00 15 [IU] via SUBCUTANEOUS
  Filled 2019-09-24: qty 0.15

## 2019-09-24 MED ORDER — DILTIAZEM HCL ER COATED BEADS 120 MG PO CP24
120.0000 mg | ORAL_CAPSULE | Freq: Every day | ORAL | Status: DC
  Administered 2019-09-24 – 2019-09-25 (×2): 120 mg via ORAL
  Filled 2019-09-24 (×3): qty 1
  Filled 2019-09-24: qty 7

## 2019-09-24 MED ORDER — POTASSIUM CHLORIDE CRYS ER 20 MEQ PO TBCR
20.0000 meq | EXTENDED_RELEASE_TABLET | Freq: Every day | ORAL | Status: DC
  Administered 2019-09-24 – 2019-09-25 (×2): 20 meq via ORAL
  Filled 2019-09-24: qty 1
  Filled 2019-09-24: qty 7
  Filled 2019-09-24 (×3): qty 1

## 2019-09-24 NOTE — Progress Notes (Signed)
   09/24/19 0122  Psych Admission Type (Psych Patients Only)  Admission Status Voluntary  Psychosocial Assessment  Patient Complaints Anxiety  Eye Contact Brief  Facial Expression Flat  Affect Appropriate to circumstance;Depressed;Sad  Speech Logical/coherent  Interaction Guarded  Motor Activity Other (Comment) (steady gait)  Appearance/Hygiene Unremarkable  Behavior Characteristics Cooperative  Mood Depressed  Aggressive Behavior  Effect No apparent injury  Thought Process  Coherency WDL  Content WDL  Delusions WDL  Perception WDL  Hallucination None reported or observed  Judgment Impaired  Confusion None  Danger to Self  Current suicidal ideation? Denies  Self-Injurious Behavior No self-injurious ideation or behavior indicators observed or expressed   Agreement Not to Harm Self Yes  Description of Agreement Verbal  Danger to Others  Danger to Others None reported or observed  D: Patient A & O x 4. Pt reports she had a good day.  A: Medications administered as prescribed. Support and encouragement provided as needed.  R: Patient remains safe on the unit. Will continue to monitor for safety and stability.

## 2019-09-24 NOTE — Progress Notes (Signed)
CSW called to follow up with residential substance use treatment referral faxed to [facility] yesterday (12/28). Patient's referral has been received but has not been reviewed at this time.   [facility] was the only residential treatment referral faxed on behalf of patient. Please refer to [facility] Counselor note from 12/28 for a detailed explanation of patient's reasons for declining or being ineligible for other referrals.  Stephanie Acre, MSW, [place] Social Worker [facility] Adult Unit  [phone]

## 2019-09-24 NOTE — Progress Notes (Signed)
[facility] MD Progress Note  09/24/2019 12:41 PM Lisa Anderson  MRN:  [id number] Subjective:  "I'm alright."  Lisa Anderson seen sitting in the dayroom. Mood and affect are significantly improved from yesterday. She reports improved sleep overnight as well. Anxiety levels are decreased. She is more future-oriented at this time and states she may be able to stay with her niece after discharge. Patient informed of discharge scheduled for tomorrow and states understanding. [facility] referral is pending. She denies SI. Denies medication side effects. She denies cravings.  From admission H&P: Lisa Anderson is a 63 year old female with history of cocaine use disorder, depression, HTN, and diabetes, who presented to [facility] as a walk-in for cocaine dependence with depression and suicidal ideation. Patient was admitted to [facility] in December 2019 for similar presentation after her daughter had thrown her out of the house for cocaine use. The patient reports six months of sobriety after last admission but relapsed on cocaine in May 2020.  Principal Problem: <principal problem not specified> Diagnosis: Active Problems:   MDD (major depressive disorder)   Cocaine dependence with cocaine-induced mood disorder ([place])  Total Time spent with patient: 15 minutes  Past Psychiatric History: See admission H&P  Past Medical History:  Past Medical History:  Diagnosis Date  . Asthma   . Cancer ([place])    breast  . Chronic congestive heart failure with left ventricular diastolic dysfunction ([place])   . Diabetes mellitus   . Hyperlipidemia   . Hypertension   . Stroke [facility])     Past Surgical History:  Procedure Laterality Date  . BREAST LUMPECTOMY    . EP IMPLANTABLE DEVICE N/A 08/07/2015   Procedure: Loop Recorder Insertion;  Surgeon: Thompson Grayer, MD;  Location: [place] CV LAB;  Service: Cardiovascular;  Laterality: N/A;  . TEE WITHOUT CARDIOVERSION N/A 08/07/2015   Procedure: TRANSESOPHAGEAL ECHOCARDIOGRAM (TEE);  Surgeon:  Sueanne Margarita, MD;  Location: [facility] ENDOSCOPY;  Service: Cardiovascular;  Laterality: N/A;  . TONSILLECTOMY     Family History:  Family History  Problem Relation Age of Onset  . Diabetes Mother   . Hyperlipidemia Mother   . Hypertension Mother   . Breast cancer Mother   . Rheum arthritis Father   . Asthma Father   . Stroke Father   . Thyroid disease Father   . Thyroid disease Brother   . Colon cancer Brother        in his 76s  . Breast cancer Sister    Family Psychiatric  History: See admission H&P Social History:  Social History   Substance and Sexual Activity  Alcohol Use Yes  . Alcohol/week: 6.0 standard drinks  . Types: 6 Cans of beer per week   Comment: occasionally     Social History   Substance and Sexual Activity  Drug Use Not Currently  . Types: "Crack" cocaine, Cocaine   Comment: last used a year ago    Social History   Socioeconomic History  . Marital status: Single    Spouse name: Not on file  . Number of children: 5  . Years of education: 80  . Highest education level: Not on file  Occupational History  . Occupation: CNA- [employer]  Tobacco Use  . Smoking status: Current Every Day Smoker    Packs/day: 0.25    Types: Cigarettes  . Smokeless tobacco: Never Used  . Tobacco comment: pt smokes 8 per day  Substance and Sexual Activity  . Alcohol use: Yes    Alcohol/week: 6.0  standard drinks    Types: 6 Cans of beer per week    Comment: occasionally  . Drug use: Not Currently    Types: "Crack" cocaine, Cocaine    Comment: last used a year ago  . Sexual activity: Never    Comment: recovering cocaine addict x6 months  Other Topics Concern  . Not on file  Social History Narrative   Lives with daughter      Caffeine use: Drinks soda (2- 20 oz soda per day)   Social Determinants of Health   Financial Resource Strain:   . Difficulty of Paying Living Expenses: Not on file  Food Insecurity:   . Worried About Charity fundraiser in  the Last Year: Not on file  . Ran Out of Food in the Last Year: Not on file  Transportation Needs:   . Lack of Transportation (Medical): Not on file  . Lack of Transportation (Non-Medical): Not on file  Physical Activity:   . Days of Exercise per Week: Not on file  . Minutes of Exercise per Session: Not on file  Stress:   . Feeling of Stress : Not on file  Social Connections:   . Frequency of Communication with Friends and Family: Not on file  . Frequency of Social Gatherings with Friends and Family: Not on file  . Attends Religious Services: Not on file  . Active Member of Clubs or Organizations: Not on file  . Attends Archivist Meetings: Not on file  . Marital Status: Not on file   Additional Social History:    Pain Medications: Please see MAR Prescriptions: Please see MAR Over the Counter: Please see MAR History of alcohol / drug use?: Yes                    Sleep: Good  Appetite:  Good  Current Medications: Current Facility-Administered Medications  Medication Dose Route Frequency Provider Last Rate Last Admin  . acetaminophen (TYLENOL) tablet 650 mg  650 mg Oral Q6H PRN D[place]ill [place], NP   650 mg at 09/22/19 1047  . albuterol (VENTOLIN HFA) 108 (90 Base) MCG/ACT inhaler 1-2 puff  1-2 puff Inhalation Q6H PRN Derrill Center, NP      . alum & mag hydroxide-simeth (MAALOX/MYLANTA) 200-200-20 MG/5ML suspension 30 mL  30 mL Oral Q4H PRN Derrill Center, NP      . cloNIDine (CATAPRES) tablet 0.1 mg  0.1 mg Oral BID Derrill Center, NP   0.1 mg at 09/24/19 0914  . clopidogrel (PLAVIX) tablet 75 mg  75 mg Oral Daily Derrill Center, NP   75 mg at 09/24/19 0915  . escitalopram (LEXAPRO) tablet 10 mg  10 mg Oral Daily Connye Burkitt, NP   10 mg at 09/24/19 0915  . gabapentin (NEURONTIN) capsule 300 mg  300 mg Oral TID Connye Burkitt, NP   300 mg at 09/24/19 0915  . hydrALAZINE (APRESOLINE) tablet 50 mg  50 mg Oral BID Derrill Center, NP   50 mg at 09/24/19  0915  . hydrochlorothiazide (MICROZIDE) capsule 12.5 mg  12.5 mg Oral Daily Derrill Center, NP   12.5 mg at 09/24/19 0915  . hydrOXYzine (ATARAX/VISTARIL) tablet 25 mg  25 mg Oral TID PRN Derrill Center, NP   25 mg at 09/23/19 2144  . insulin aspart (novoLOG) injection 0-5 Units  0-5 Units Subcutaneous QHS Derrill Center, NP   3 Units at 09/23/19 2140  .  insulin aspart (novoLOG) injection 0-9 Units  0-9 Units Subcutaneous TID WC Derrill Center, NP   5 Units at 09/23/19 1716  . insulin glargine (LANTUS) injection 10 Units  10 Units Subcutaneous QHS Cobos, Myer Peer, MD   10 Units at 09/23/19 2140  . magnesium hydroxide (MILK OF MAGNESIA) suspension 30 mL  30 mL Oral Daily PRN Derrill Center, NP      . nicotine (NICODERM CQ - dosed in mg/24 hr) patch 7 mg  7 mg Transdermal Daily Derrill Center, NP   7 mg at 09/24/19 0917  . rosuvastatin (CRESTOR) tablet 40 mg  40 mg Oral q1800 Connye Burkitt, NP   40 mg at 09/23/19 1715  . traZODone (DESYREL) tablet 50 mg  50 mg Oral QHS PRN Derrill Center, NP   50 mg at 09/23/19 2141    Lab Results:  Results for orders placed or performed during the hospital encounter of 09/20/19 (from the past 48 hour(s))  Glucose, capillary     Status: Abnormal   Collection Time: 09/22/19  5:21 PM  Result Value Ref Range   Glucose-Capillary 254 (H) 70 - 99 mg/dL  Hemoglobin A1c     Status: Abnormal   Collection Time: 09/22/19  5:53 PM  Result Value Ref Range   Hgb A1c MFr Bld 9.3 (H) 4.8 - 5.6 %    Comment: (NOTE) Pre diabetes:          5.7%-6.4% Diabetes:              >6.4% Glycemic control for   <7.0% adults with diabetes    Mean Plasma Glucose 220.21 mg/dL    Comment: Performed at [facility], [place] [address] [place], [place] [address]  Lipid panel     Status: Abnormal   Collection Time: 09/22/19  5:53 PM  Result Value Ref Range   Cholesterol 211 (H) 0 - 200 mg/dL   Triglycerides 185 (H) <150 mg/dL   HDL 41 >40 mg/dL   Total CHOL/HDL Ratio 5.1  RATIO   VLDL 37 0 - 40 mg/dL   LDL Cholesterol 133 (H) 0 - 99 mg/dL    Comment:        Total Cholesterol/HDL:CHD Risk Coronary Heart Disease Risk Table                     Men   Women  1/2 Average Risk   3.4   3.3  Average Risk       5.0   4.4  2 X Average Risk   9.6   7.1  3 X Average Risk  23.4   11.0        Use the calculated Patient Ratio above and the CHD Risk Table to determine the patient's CHD Risk.        ATP III CLASSIFICATION (LDL):  <100     mg/dL   Optimal  100-129  mg/dL   Near or Above                    Optimal  130-159  mg/dL   Borderline  160-189  mg/dL   High  >190     mg/dL   Very High Performed at [place] [address], [place], [place] [address]   Glucose, capillary     Status: Abnormal   Collection Time: 09/22/19  7:45 PM  Result Value Ref Range   Glucose-Capillary 207 (H) 70 -  99 mg/dL  Glucose, capillary     Status: Abnormal   Collection Time: 09/23/19  6:00 AM  Result Value Ref Range   Glucose-Capillary 118 (H) 70 - 99 mg/dL  Basic metabolic panel     Status: Abnormal   Collection Time: 09/23/19  6:38 AM  Result Value Ref Range   Sodium 140 135 - 145 mmol/L    Comment: DELTA CHECK NOTED   Potassium 3.4 (L) 3.5 - 5.1 mmol/L   Chloride 101 98 - 111 mmol/L   CO2 28 22 - 32 mmol/L   Glucose, Bld 135 (H) 70 - 99 mg/dL   BUN 18 8 - 23 mg/dL   Creatinine, Ser 1.28 (H) 0.44 - 1.00 mg/dL   Calcium 9.1 8.9 - 10.3 mg/dL   GFR calc non Af Amer 45 (L) >60 mL/min   GFR calc Af Amer 52 (L) >60 mL/min   Anion gap 11 5 - 15    Comment: Performed at [facility], [place] [address] [place], [place] [address]  Glucose, capillary     Status: Abnormal   Collection Time: 09/23/19 11:35 AM  Result Value Ref Range   Glucose-Capillary 225 (H) 70 - 99 mg/dL  Glucose, capillary     Status: Abnormal   Collection Time: 09/23/19  5:08 PM  Result Value Ref Range   Glucose-Capillary 274 (H) 70 - 99 mg/dL  Glucose, capillary      Status: Abnormal   Collection Time: 09/23/19  7:46 PM  Result Value Ref Range   Glucose-Capillary 300 (H) 70 - 99 mg/dL  Glucose, capillary     Status: Abnormal   Collection Time: 09/24/19  5:51 AM  Result Value Ref Range   Glucose-Capillary 122 (H) 70 - 99 mg/dL  Glucose, capillary     Status: Abnormal   Collection Time: 09/24/19 12:03 PM  Result Value Ref Range   Glucose-Capillary 190 (H) 70 - 99 mg/dL    Blood Alcohol level:  Lab Results  Component Value Date   ETH <10 09/18/2018   ETH <5 [id number]    Metabolic Disorder Labs: Lab Results  Component Value Date   HGBA1C 9.3 (H) 09/22/2019   MPG 220.21 09/22/2019   MPG 171.42 12/06/2017   No results found for: PROLACTIN Lab Results  Component Value Date   CHOL 211 (H) 09/22/2019   TRIG 185 (H) 09/22/2019   HDL 41 09/22/2019   CHOLHDL 5.1 09/22/2019   VLDL 37 09/22/2019   LDLCALC 133 (H) 09/22/2019   LDLCALC 139 (H) 01/16/2019    Physical Findings: AIMS: Facial and Oral Movements Muscles of Facial Expression: None, normal Lips and Perioral Area: None, normal Jaw: None, normal Tongue: None, normal,Extremity Movements Upper (arms, wrists, hands, fingers): None, normal Lower (legs, knees, ankles, toes): None, normal, Trunk Movements Neck, shoulders, hips: None, normal, Overall Severity Severity of abnormal movements (highest score from questions above): None, normal Incapacitation due to abnormal movements: None, normal Patient's awareness of abnormal movements (rate only patient's report): No Awareness, Dental Status Current problems with teeth and/or dentures?: No Does patient usually wear dentures?: No  CIWA:  CIWA-Ar Total: 1 COWS:  COWS Total Score: 1  Musculoskeletal: Strength & Muscle Tone: within normal limits Gait & Station: normal Patient leans: N/A  Psychiatric Specialty Exam: Physical Exam  Nursing note and vitals reviewed. Constitutional: She is oriented to person, place, and time. She  appears well-developed and well-nourished.  Cardiovascular: Normal rate.  Respiratory: Effort normal.  Neurological: She is alert and  oriented to person, place, and time.    Review of Systems  Constitutional: Negative.   Respiratory: Negative for cough and shortness of breath.   Gastrointestinal: Negative for diarrhea, nausea and vomiting.  Neurological: Negative for headaches.  Psychiatric/Behavioral: Negative for agitation, behavioral problems, dysphoric mood, hallucinations, self-injury, sleep disturbance and suicidal ideas. The patient is not nervous/anxious.     Blood pressure (!) 158/72, pulse 70, temperature 97.7 F (36.5 C), resp. rate 16, SpO2 99 %.There is no height or weight on file to calculate BMI.  General Appearance: Casual  Eye Contact:  Good  Speech:  Normal Rate  Volume:  Normal  Mood:  Euthymic  Affect:  Appropriate and Congruent  Thought Process:  Coherent  Orientation:  Full (Time, Place, and Person)  Thought Content:  Logical  Suicidal Thoughts:  No  Homicidal Thoughts:  No  Memory:  Immediate;   Fair Recent;   Fair  Judgement:  Intact  Insight:  Fair  Psychomotor Activity:  Normal  Concentration:  Concentration: Fair and Attention Span: Fair  Recall:  [employer] of Knowledge:  Fair  Language:  Good  Akathisia:  No  Handed:  Right  AIMS (if indicated):     Assets:  Communication Skills Desire for Improvement Resilience Social Support  ADL's:  Intact  Cognition:  WNL  Sleep:  Number of Hours: 6.75     Treatment Plan Summary: Daily contact with patient to assess and evaluate symptoms and progress in treatment and Medication management   Continue inpatient hospitalization.  Continue Lexapro 10 mg PO daily for depression/anxiety Continue gabapentin 300 mg PO TID for anxiety Continue Vistaril 25 mg PO TID PRN anxiety Continue clonidine 0.1 mg PO BID for HTN Continue Plavix 75 mg PO daily for antiplatelet Continue hydralazine 50 mg PO BID for  HTN Continue HCTZ 12.5 mg PO daily for HTN Continue Lantus 10 units SQ QHS for diabetes Continue sliding scale insulin ACHS for diabetes Continue Crestor 40 mg PO daily for HLD Continue trazodone 50 mg PO QHS PRN insomnia  Patient will participate in the therapeutic group milieu.  Discharge disposition in progress.   Connye Burkitt, NP 09/24/2019, 12:41 PM

## 2019-09-24 NOTE — Progress Notes (Signed)
   09/24/19 1300  Psych Admission Type (Psych Patients Only)  Admission Status Voluntary  Psychosocial Assessment  Patient Complaints Anxiety  Eye Contact Brief  Facial Expression Flat  Affect Appropriate to circumstance;Depressed;Sad  Speech Logical/coherent  Interaction Guarded  Motor Activity Other (Comment) (steady gait)  Appearance/Hygiene Unremarkable  Behavior Characteristics Cooperative  Mood Depressed  Aggressive Behavior  Effect No apparent injury  Thought Process  Coherency WDL  Content WDL  Delusions WDL  Perception WDL  Hallucination None reported or observed  Judgment Impaired  Confusion None  Danger to Self  Current suicidal ideation? Denies  Self-Injurious Behavior No self-injurious ideation or behavior indicators observed or expressed   Agreement Not to Harm Self Yes  Description of Agreement Verbal  Danger to Others  Danger to Others None reported or observed

## 2019-09-25 ENCOUNTER — Other Ambulatory Visit: Payer: Self-pay

## 2019-09-25 ENCOUNTER — Inpatient Hospital Stay (HOSPITAL_COMMUNITY): Payer: Self-pay

## 2019-09-25 ENCOUNTER — Encounter (HOSPITAL_COMMUNITY): Payer: Self-pay

## 2019-09-25 ENCOUNTER — Emergency Department (HOSPITAL_COMMUNITY)
Admission: EM | Admit: 2019-09-25 | Discharge: 2019-09-25 | Discharge status: Against Medical Advice | Payer: Self-pay | Attending: Emergency Medicine | Admitting: Emergency Medicine

## 2019-09-25 DIAGNOSIS — Z79899 Other long term (current) drug therapy: Secondary | ICD-10-CM | POA: Insufficient documentation

## 2019-09-25 DIAGNOSIS — N644 Mastodynia: Secondary | ICD-10-CM | POA: Insufficient documentation

## 2019-09-25 DIAGNOSIS — I110 Hypertensive heart disease with heart failure: Secondary | ICD-10-CM | POA: Insufficient documentation

## 2019-09-25 DIAGNOSIS — E1159 Type 2 diabetes mellitus with other circulatory complications: Secondary | ICD-10-CM | POA: Insufficient documentation

## 2019-09-25 DIAGNOSIS — Z7902 Long term (current) use of antithrombotics/antiplatelets: Secondary | ICD-10-CM | POA: Insufficient documentation

## 2019-09-25 DIAGNOSIS — Z5329 Procedure and treatment not carried out because of patient's decision for other reasons: Secondary | ICD-10-CM | POA: Insufficient documentation

## 2019-09-25 DIAGNOSIS — Z794 Long term (current) use of insulin: Secondary | ICD-10-CM | POA: Insufficient documentation

## 2019-09-25 DIAGNOSIS — I5032 Chronic diastolic (congestive) heart failure: Secondary | ICD-10-CM | POA: Insufficient documentation

## 2019-09-25 DIAGNOSIS — F329 Major depressive disorder, single episode, unspecified: Principal | ICD-10-CM

## 2019-09-25 DIAGNOSIS — J45909 Unspecified asthma, uncomplicated: Secondary | ICD-10-CM | POA: Insufficient documentation

## 2019-09-25 LAB — COMPREHENSIVE METABOLIC PANEL
ALT: 11 U/L (ref 0–44)
AST: 11 U/L — ABNORMAL LOW (ref 15–41)
Albumin: 3.5 g/dL (ref 3.5–5.0)
Alkaline Phosphatase: 95 U/L (ref 38–126)
Anion gap: 8 (ref 5–15)
BUN: 23 mg/dL (ref 8–23)
CO2: 30 mmol/L (ref 22–32)
Calcium: 9 mg/dL (ref 8.9–10.3)
Chloride: 101 mmol/L (ref 98–111)
Creatinine, Ser: 1.36 mg/dL — ABNORMAL HIGH (ref 0.44–1.00)
GFR calc Af Amer: 49 mL/min — ABNORMAL LOW (ref >60)
GFR calc non Af Amer: 42 mL/min — ABNORMAL LOW (ref >60)
Glucose, Bld: 232 mg/dL — ABNORMAL HIGH (ref 70–99)
Potassium: 3.7 mmol/L (ref 3.5–5.1)
Sodium: 139 mmol/L (ref 135–145)
Total Bilirubin: 0.2 mg/dL — ABNORMAL LOW (ref 0.3–1.2)
Total Protein: 6.8 g/dL (ref 6.5–8.1)

## 2019-09-25 LAB — CBC
HCT: 39.1 % (ref 36.0–46.0)
Hemoglobin: 12 g/dL (ref 12.0–15.0)
MCH: 27.1 pg (ref 26.0–34.0)
MCHC: 30.7 g/dL (ref 30.0–36.0)
MCV: 88.5 fL (ref 80.0–100.0)
Platelets: 261 10*3/uL (ref 150–400)
RBC: 4.42 MIL/uL (ref 3.87–5.11)
RDW: 13.4 % (ref 11.5–15.5)
WBC: 7.7 10*3/uL (ref 4.0–10.5)
nRBC: 0 % (ref 0.0–0.2)

## 2019-09-25 LAB — BASIC METABOLIC PANEL
Anion gap: 8 (ref 5–15)
BUN: 25 mg/dL — ABNORMAL HIGH (ref 8–23)
CO2: 29 mmol/L (ref 22–32)
Calcium: 9 mg/dL (ref 8.9–10.3)
Chloride: 99 mmol/L (ref 98–111)
Creatinine, Ser: 1.58 mg/dL — ABNORMAL HIGH (ref 0.44–1.00)
GFR calc Af Amer: 41 mL/min — ABNORMAL LOW (ref >60)
GFR calc non Af Amer: 35 mL/min — ABNORMAL LOW (ref >60)
Glucose, Bld: 276 mg/dL — ABNORMAL HIGH (ref 70–99)
Potassium: 4.4 mmol/L (ref 3.5–5.1)
Sodium: 136 mmol/L (ref 135–145)

## 2019-09-25 LAB — GLUCOSE, CAPILLARY
Glucose-Capillary: 235 mg/dL — ABNORMAL HIGH (ref 70–99)
Glucose-Capillary: 204 mg/dL — ABNORMAL HIGH (ref 70–99)

## 2019-09-25 LAB — TROPONIN I (HIGH SENSITIVITY): Troponin I (High Sensitivity): 6 ng/L (ref <18)

## 2019-09-25 MED ORDER — HYDROXYZINE HCL 25 MG PO TABS: 25.0000 mg | ORAL_TABLET | Freq: Three times a day (TID) | ORAL | 0 refills | Status: DC | PRN

## 2019-09-25 MED ORDER — POTASSIUM CHLORIDE CRYS ER 20 MEQ PO TBCR: 20.0000 meq | EXTENDED_RELEASE_TABLET | Freq: Every day | ORAL | 0 refills | Status: DC

## 2019-09-25 MED ORDER — DILTIAZEM HCL ER COATED BEADS 120 MG PO CP24: 120.0000 mg | ORAL_CAPSULE | Freq: Every day | ORAL | 0 refills | Status: DC

## 2019-09-25 MED ORDER — ALBUTEROL SULFATE HFA 108 (90 BASE) MCG/ACT IN AERS: 1.0000 | INHALATION_SPRAY | Freq: Four times a day (QID) | RESPIRATORY_TRACT | 0 refills | Status: DC | PRN

## 2019-09-25 MED ORDER — NICOTINE 7 MG/24HR TD PT24: 7.0000 mg | MEDICATED_PATCH | Freq: Every day | TRANSDERMAL | 0 refills | Status: DC

## 2019-09-25 MED ORDER — ESCITALOPRAM OXALATE 10 MG PO TABS: 10.0000 mg | ORAL_TABLET | Freq: Every day | ORAL | 0 refills | Status: DC

## 2019-09-25 MED ORDER — CLONIDINE HCL 0.1 MG PO TABS: 0.1000 mg | ORAL_TABLET | Freq: Two times a day (BID) | ORAL | 0 refills | Status: DC

## 2019-09-25 MED ORDER — HYDRALAZINE HCL 50 MG PO TABS: 50.0000 mg | ORAL_TABLET | Freq: Two times a day (BID) | ORAL | 0 refills | Status: DC

## 2019-09-25 MED ORDER — INSULIN GLARGINE 100 UNIT/ML ~~LOC~~ SOLN: 15.0000 [IU] | Freq: Every day | SUBCUTANEOUS | 0 refills | Status: DC

## 2019-09-25 MED ORDER — TRAZODONE HCL 50 MG PO TABS: 50.0000 mg | ORAL_TABLET | Freq: Every evening | ORAL | 0 refills | Status: AC | PRN

## 2019-09-25 MED ORDER — ROSUVASTATIN CALCIUM 40 MG PO TABS: 40.0000 mg | ORAL_TABLET | Freq: Every day | ORAL | 0 refills | Status: DC

## 2019-09-25 MED ORDER — HYDROCHLOROTHIAZIDE 12.5 MG PO CAPS: 12.5000 mg | ORAL_CAPSULE | Freq: Every day | ORAL | 0 refills | Status: DC

## 2019-09-25 MED ORDER — ACETAMINOPHEN 325 MG PO TABS: 650.0000 mg | ORAL_TABLET | Freq: Once | ORAL | Status: DC

## 2019-09-25 MED ORDER — GABAPENTIN 300 MG PO CAPS: 300.0000 mg | ORAL_CAPSULE | Freq: Three times a day (TID) | ORAL | 0 refills | Status: DC

## 2019-09-25 MED ORDER — CLOPIDOGREL BISULFATE 75 MG PO TABS: 75.0000 mg | ORAL_TABLET | Freq: Every day | ORAL | 0 refills | Status: DC

## 2019-09-25 NOTE — [facility] Group Notes (Signed)
Occupational Therapy Group Note  Date:  09/25/2019 Time:  1:40 PM  Group Topic/Focus:  Self Esteem Action Plan:   The focus of this group is to help patients create a plan to continue to build self-esteem after discharge.  Participation Level:  Active  Participation Quality:  Attentive  Affect:  Blunted  Cognitive:  Alert  Insight: Improving  Engagement in Group:  Engaged  Modes of Intervention:  Activity, Discussion, Education and Socialization  Additional Comments:    S: At least I still have my life  O: OT tx with focus on self esteem building this date. Education given on definition of self esteem, with both causes of low and high self esteem identified. Activity given for pt to identify a positive/aspiring trait for each letter of the alphabet. Pt to work with peers to help complete activity and build positive thinking.   A: Pt engaged and supportive with other group members. She shares that she increases her self esteem by staying strong in her faith and reminding herself that "it could be worse". Pt very supportive and uplifting to all peers in group.  P: OT group will be x1 per week while pt inpatient.  Zenovia Jarred, MSOT, OTR/L Behavioral Health OT/ Acute Relief OT PHP Office: [place] 09/25/2019, 1:40 PM

## 2019-09-25 NOTE — Discharge Summary (Signed)
Physician Discharge Summary Note  Patient:  Lisa Anderson is an 63 y.o., female MRN:  [id number] DOB:  [birth year] Patient phone:  [phone] (home)  Patient address:   [address] [place] [address],  Total Time spent with patient: 15 minutes  Date of Admission:  09/20/2019 Date of Discharge: 09/25/19  Reason for Admission:  Cocaine dependence with suicidal ideation  Principal Problem: <principal problem not specified> Discharge Diagnoses: Active Problems:   MDD (major depressive disorder)   Cocaine dependence with cocaine-induced mood disorder (HCC)   Past Psychiatric History: History of cocaine use disorder, alcohol use disorder, and depression. Multiple prior hospitalizations for similar presentation. History of suicide attempts years ago.  Past Medical History:  Past Medical History:  Diagnosis Date  . Asthma   . Cancer ([facility])    breast  . Chronic congestive heart failure with left ventricular diastolic dysfunction ([facility])   . Diabetes mellitus   . Hyperlipidemia   . Hypertension   . Stroke [facility])     Past Surgical History:  Procedure Laterality Date  . BREAST LUMPECTOMY    . EP IMPLANTABLE DEVICE N/A 08/07/2015   Procedure: Loop Recorder Insertion;  Surgeon: Thompson Grayer, MD;  Location: [facility];  Service: Cardiovascular;  Laterality: N/A;  . TEE WITHOUT CARDIOVERSION N/A 08/07/2015   Procedure: TRANSESOPHAGEAL ECHOCARDIOGRAM (TEE);  Surgeon: Sueanne Margarita, MD;  Location: [facility] ENDOSCOPY;  Service: Cardiovascular;  Laterality: N/A;  . TONSILLECTOMY     Family History:  Family History  Problem Relation Age of Onset  . Diabetes Mother   . Hyperlipidemia Mother   . Hypertension Mother   . Breast cancer Mother   . Rheum arthritis Father   . Asthma Father   . Stroke Father   . Thyroid disease Father   . Thyroid disease Brother   . Colon cancer Brother        in his 30s  . Breast cancer Sister    Family Psychiatric  History: Denies Social  History:  Social History   Substance and Sexual Activity  Alcohol Use Yes  . Alcohol/week: 6.0 standard drinks  . Types: 6 Cans of beer per week   Comment: occasionally     Social History   Substance and Sexual Activity  Drug Use Not Currently  . Types: "Crack" cocaine, Cocaine   Comment: last used a year ago    Social History   Socioeconomic History  . Marital status: Single    Spouse name: Not on file  . Number of children: 5  . Years of education: 62  . Highest education level: Not on file  Occupational History  . Occupation: CNA- [employer]  Tobacco Use  . Smoking status: Current Every Day Smoker    Packs/day: 0.25    Types: Cigarettes  . Smokeless tobacco: Never Used  . Tobacco comment: pt smokes 8 per day  Substance and Sexual Activity  . Alcohol use: Yes    Alcohol/week: 6.0 standard drinks    Types: 6 Cans of beer per week    Comment: occasionally  . Drug use: Not Currently    Types: "Crack" cocaine, Cocaine    Comment: last used a year ago  . Sexual activity: Never    Comment: recovering cocaine addict x6 months  Other Topics Concern  . Not on file  Social History Narrative   Lives with daughter      Caffeine use: Drinks soda (2- 20 oz soda per day)  Social Determinants of Health   Financial Resource Strain:   . Difficulty of Paying Living Expenses: Not on file  Food Insecurity:   . Worried About Charity fundraiser in the Last Year: Not on file  . Ran Out of Food in the Last Year: Not on file  Transportation Needs:   . Lack of Transportation (Medical): Not on file  . Lack of Transportation (Non-Medical): Not on file  Physical Activity:   . Days of Exercise per Week: Not on file  . Minutes of Exercise per Session: Not on file  Stress:   . Feeling of Stress : Not on file  Social Connections:   . Frequency of Communication with Friends and Family: Not on file  . Frequency of Social Gatherings with Friends and Family: Not on file   . Attends Religious Services: Not on file  . Active Member of Clubs or Organizations: Not on file  . Attends Archivist Meetings: Not on file  . Marital Status: Not on file    Hospital Course:  From admission H&P: Lisa Anderson is a 63 year old female with history of cocaine use disorder, depression, HTN, and diabetes, who presented to [facility] as a walk-in for cocaine dependence with depression and suicidal ideation. Patient was admitted to [facility] in December 2019 for similar presentation after her daughter had thrown her out of the house for cocaine use. She was discharged at that time on Remeron, Neurontin, and trazodone. The patient reports six months of sobriety after last admission but relapsed on cocaine in May 2020. She reports conflict with her daughter with whom she lives. 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 She reports guilt and depression related to drug use, with suicidal ideation to overdose on her medications or shoot herself. She denies suicidal plan or intent on the unit. She is seen at [facility] and prescribed Lexapro and Neurontin. She reports being off all medications for one month due to not being able to afford them. She denies drug use besides cocaine. Denies alcohol use. Labwork is pending. She denies HI/AVH.  Lisa Anderson was admitted for cocaine dependence with suicidal ideation. She remained on the [facility] unit for five days. She was restarted on Lexapro, gabapentin, PRN Vistaril, and PRN trazodone. She participated in group therapy on the unit. She responded well to treatment with no adverse effects reported. She has shown improved mood, affect, sleep, and interaction. She denies any SI/HI/AVH and contracts for safety. She is discharging on the medications listed below. She agrees to follow up at [facility] (see below). Patient is provided with prescriptions and medication samples upon discharge. She is referred to [facility] for housing resources  and offered transportation but refused the referral and transportation.  At time of discharge, patient became angry during conversation with CSW and made a comment about walking into a car, as described in [facility] note. I spoke with patient again. She continues to deny SI but states she is angry with her social worker "for not doing anything to help since I've been here." Patient appears to be malingering for secondary gain related to homelessness. Patient has been offered multiple resources as described in multiple notes from CSWs and has refused multiple resources for housing and rehab.  Physical Findings: AIMS: Facial and Oral Movements Muscles of Facial Expression: None, normal Lips and Perioral Area: None, normal Jaw: None, normal Tongue: None, normal,Extremity Movements Upper (arms, wrists, hands, fingers): None,  normal Lower (legs, knees, ankles, toes): None, normal, Trunk Movements Neck, shoulders, hips: None, normal, Overall Severity Severity of abnormal movements (highest score from questions above): None, normal Incapacitation due to abnormal movements: None, normal Patient's awareness of abnormal movements (rate only patient's report): No Awareness, Dental Status Current problems with teeth and/or dentures?: No Does patient usually wear dentures?: No  CIWA:  CIWA-Ar Total: 1 COWS:  COWS Total Score: 1  Musculoskeletal: Strength & Muscle Tone: within normal limits Gait & Station: normal Patient leans: N/A  Psychiatric Specialty Exam: Physical Exam  Nursing note and vitals reviewed. Constitutional: She is oriented to person, place, and time. She appears well-developed and well-nourished.  Cardiovascular: Normal rate.  Respiratory: Effort normal.  Neurological: She is alert and oriented to person, place, and time.    Review of Systems  Constitutional: Negative.   Respiratory: Negative for cough and shortness of breath.   Psychiatric/Behavioral: Negative for agitation,  behavioral problems, dysphoric mood, hallucinations, self-injury, sleep disturbance and suicidal ideas. The patient is not nervous/anxious.     Blood pressure 130/65, pulse 69, temperature 98.1 F (36.7 C), resp. rate 16, SpO2 99 %.There is no height or weight on file to calculate BMI.  See MD's discharge SRA      Has this patient used any form of tobacco in the last 30 days? (Cigarettes, Smokeless Tobacco, Cigars, and/or Pipes) Yes, a prescription for an FDA-approved medication for tobacco cessation was offered at discharge.   Blood Alcohol level:  Lab Results  Component Value Date   ETH <10 09/18/2018   ETH <5 16/06/9603    Metabolic Disorder Labs:  Lab Results  Component Value Date   HGBA1C 9.3 (H) 09/22/2019   MPG 220.21 09/22/2019   MPG 171.42 12/06/2017   No results found for: PROLACTIN Lab Results  Component Value Date   CHOL 211 (H) 09/22/2019   TRIG 185 (H) 09/22/2019   HDL 41 09/22/2019   CHOLHDL 5.1 09/22/2019   VLDL 37 09/22/2019   LDLCALC 133 (H) 09/22/2019   LDLCALC 139 (H) 01/16/2019    See Psychiatric Specialty Exam and Suicide Risk Assessment completed by Attending Physician prior to discharge.  Discharge destination:  Home  Is patient on multiple antipsychotic therapies at discharge:  No   Has Patient had three or more failed trials of antipsychotic monotherapy by history:  No  Recommended Plan for Multiple Antipsychotic Therapies: NA  Discharge Instructions    Discharge instructions   Complete by: As directed    Patient is instructed to take all prescribed medications as recommended. Report any side effects or adverse reactions to your outpatient psychiatrist. Patient is instructed to abstain from alcohol and illegal drugs while on prescription medications. In the event of worsening symptoms, patient is instructed to call the crisis hotline, 911, or go to the nearest emergency department for evaluation and treatment.     Allergies as of  09/25/2019      Reactions   Prednisone Hives, Other (See Comments)   Made her "crazy", suicidal   Benicar [olmesartan] Other (See Comments)   Did not work at all for patient   Celexa [citalopram] Other (See Comments)   Made patient feel crazy, fluoxetine is fine   Effexor [venlafaxine] Other (See Comments)   Made patient feel crazy   Shrimp [shellfish Allergy] Hives, Swelling      Medication List    STOP taking these medications   Contour Next Monitor w/Device Kit   gabapentin 600 MG tablet Commonly known as:  NEURONTIN Replaced by: gabapentin 300 MG capsule   glipiZIDE 10 MG tablet Commonly known as: GLUCOTROL   glucose blood test strip Commonly known as: Contour Next Test   hydrochlorothiazide 12.5 MG tablet Commonly known as: HYDRODIURIL Replaced by: hydrochlorothiazide 12.5 MG capsule   VITAMIN D PO     TAKE these medications     Indication  albuterol 108 (90 Base) MCG/ACT inhaler Commonly known as: VENTOLIN HFA Inhale 1-2 puffs into the lungs every 6 (six) hours as needed for wheezing or shortness of breath. What changed:   how much to take  additional instructions  Indication: Asthma   cloNIDine 0.1 MG tablet Commonly known as: CATAPRES Take 1 tablet (0.1 mg total) by mouth 2 (two) times daily. What changed: when to take this  Indication: High Blood Pressure Disorder   clopidogrel 75 MG tablet Commonly known as: PLAVIX Take 1 tablet (75 mg total) by mouth daily.  Indication: antiplatelet   diltiazem 120 MG 24 hr capsule Commonly known as: CARDIZEM CD Take 1 capsule (120 mg total) by mouth daily. What changed:   medication strength  how much to take  Indication: High Blood Pressure Disorder   escitalopram 10 MG tablet Commonly known as: LEXAPRO Take 1 tablet (10 mg total) by mouth daily.  Indication: Major Depressive Disorder   gabapentin 300 MG capsule Commonly known as: NEURONTIN Take 1 capsule (300 mg total) by mouth 3 (three) times  daily. Replaces: gabapentin 600 MG tablet  Indication: Neuropathic Pain   hydrALAZINE 50 MG tablet Commonly known as: APRESOLINE Take 1 tablet (50 mg total) by mouth 2 (two) times daily. What changed: when to take this  Indication: High Blood Pressure Disorder   hydrochlorothiazide 12.5 MG capsule Commonly known as: MICROZIDE Take 1 capsule (12.5 mg total) by mouth daily. Replaces: hydrochlorothiazide 12.5 MG tablet  Indication: High Blood Pressure Disorder   hydrOXYzine 25 MG tablet Commonly known as: ATARAX/VISTARIL Take 1 tablet (25 mg total) by mouth 3 (three) times daily as needed for anxiety.  Indication: Feeling Anxious   insulin aspart 100 UNIT/ML injection Commonly known as: NovoLOG Sliding scale; max 9 units TID  Indication: Type 2 Diabetes   insulin glargine 100 UNIT/ML injection Commonly known as: LANTUS Inject 0.15 mLs (15 Units total) into the skin at bedtime. What changed: how much to take  Indication: Type 2 Diabetes   nicotine 7 mg/24hr patch Commonly known as: Nicoderm CQ Place 1 patch (7 mg total) onto the skin daily.  Indication: Nicotine Addiction   potassium chloride SA 20 MEQ tablet Commonly known as: Klor-Con M20 Take 1 tablet (20 mEq total) by mouth daily.  Indication: Low Amount of Potassium in the Blood   rosuvastatin 40 MG tablet Commonly known as: CRESTOR Take 1 tablet (40 mg total) by mouth daily at 6 PM. What changed: when to take this  Indication: High Amount of Fats in the Blood   traZODone 50 MG tablet Commonly known as: DESYREL Take 1 tablet (50 mg total) by mouth at bedtime as needed for sleep. What changed:   when to take this  reasons to take this  Indication: [facility] Follow up.   Why: A referral for residential substance use treatment was faxed on your behalf on 09/23/19. Please follow up to determine when a bed will become  available on a DAILY BASIS.  Contact information: [address]  [place] [address] [phone]        [facility] Follow up.   Why: Walk-in hours are Monday-Friday from 8:00am-5:00pm. Please be sure to contact agency for outpatient medication management and therapy services. Be sure to provide any discharge paperwork, including your list of medications.  Contact information: [address] [place] [address] [phone]           Follow-up recommendations: Activity as tolerated. Diet as recommended by primary care physician. Keep all scheduled follow-up appointments as recommended.   Comments:   Patient is instructed to take all prescribed medications as recommended. Report any side effects or adverse reactions to your outpatient psychiatrist. Patient is instructed to abstain from alcohol and illegal drugs while on prescription medications. In the event of worsening symptoms, patient is instructed to call the crisis hotline, 911, or go to the nearest emergency department for evaluation and treatment.  Signed: Connye Burkitt, NP 09/25/2019, 2:09 PM

## 2019-09-25 NOTE — ED Provider Notes (Addendum)
[facility] DEPT Provider Note   CSN: [id number] Arrival date & time: 09/25/19  1430     History Chief Complaint  Patient presents with  . Breast Pain    Lisa Anderson is a 63 y.o. female.  63 y.o female with a PMH of CHF, Stroke, HTN presents to the ED with a chief complaint of left breast pain x 1 month. She describes this as a throbbing sensation to her left breast, this is worse at night and worse when she lifts her left arm.  She reports there is no changes to her skin, no nipple drainage.  She has not taken any medication for improvement in symptoms.  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 She describes this as feeling her left breast are somewhat swollen. She was discharged from [facility] this morning for psychiatric evaluation.She denies any fever, nipple discharge, changes in the skin, chest pain or shortness of breath.   The history is provided by the patient.       Past Medical History:  Diagnosis Date  . Asthma   . Cancer ([place])    breast  . Chronic congestive heart failure with left ventricular diastolic dysfunction ([place])   . Diabetes mellitus   . Hyperlipidemia   . Hypertension   . Stroke [facility])     Patient Active Problem List   Diagnosis Date Noted  . MDD (major depressive disorder), recurrent episode, severe ([place]) 09/19/2018  . Aphasia 12/06/2017  . Hypokalemia 12/06/2017  . Leukocytosis 12/06/2017  . Bipolar I disorder ([place]) 05/23/2017  . MDD (major depressive disorder), recurrent severe, without psychosis ([place]) 05/22/2017  . Major depressive disorder, recurrent episode, severe ([place]) 05/21/2017  . Cocaine dependence with cocaine-induced mood disorder ([place]) 05/21/2017  . Suicidal intent 05/20/2017  . Drug overdose 05/19/2017  . Family history of colon cancer 02/23/2017  . Rectal bleeding 02/23/2017  . Gastroesophageal reflux disease without esophagitis 02/23/2017  .  Intractable vomiting with nausea 02/23/2017  . Abnormal thyroid blood test 01/02/2017  . Insomnia 01/02/2017  . Anxiety 01/02/2017  . IDDM (insulin dependent diabetes mellitus)   . Asthma 11/12/2015  . Acute respiratory failure ([place]) 11/11/2015  . TIA (transient ischemic attack) 11/01/2015  . Chronic diastolic CHF (congestive heart failure) ([place]) 11/01/2015  . Acute focal neurological deficit 11/01/2015  . Type 2 diabetes mellitus with circulatory disorder ([place])   . HLD (hyperlipidemia)   . Palpitations   . History of stroke 08/05/2015  . Hypertension 08/05/2015  . Stroke due to embolism of right posterior cerebral artery ([place]) 08/05/2015  . Diabetes mellitus 08/05/2015  . Alcohol dependency ([place]) 08/10/2013  . Cocaine abuse ([place]) 08/08/2013  . Alcohol abuse 08/08/2013  . MDD (major depressive disorder) 08/08/2013    Past Surgical History:  Procedure Laterality Date  . BREAST LUMPECTOMY    . EP IMPLANTABLE DEVICE N/A 08/07/2015   Procedure: Loop Recorder Insertion;  Surgeon: Thompson Grayer, MD;  Location: [facility];  Service: Cardiovascular;  Laterality: N/A;  . TEE WITHOUT CARDIOVERSION N/A 08/07/2015   Procedure: TRANSESOPHAGEAL ECHOCARDIOGRAM (TEE);  Surgeon: Sueanne Margarita, MD;  Location: [facility] ENDOSCOPY;  Service: Cardiovascular;  Laterality: N/A;  . TONSILLECTOMY       OB History   No obstetric history on file.     Family History  Problem Relation Age of Onset  . Diabetes Mother   . Hyperlipidemia Mother   . Hypertension Mother   .  Breast cancer Mother   . Rheum arthritis Father   . Asthma Father   . Stroke Father   . Thyroid disease Father   . Thyroid disease Brother   . Colon cancer Brother        in his 56s  . Breast cancer Sister     Social History   Tobacco Use  . Smoking status: Current Every Day Smoker    Packs/day: 0.25    Types: Cigarettes  . Smokeless tobacco: Never Used  . Tobacco comment: pt smokes 8 per day  Substance Use Topics  .  Alcohol use: Yes    Alcohol/week: 6.0 standard drinks    Types: 6 Cans of beer per week    Comment: occasionally  . Drug use: Not Currently    Types: "Crack" cocaine, Cocaine    Comment: last used a year ago    Home Medications Prior to Admission medications   Medication Sig Start Date End Date Taking? Authorizing Provider  albuterol (VENTOLIN HFA) 108 (90 Base) MCG/ACT inhaler Inhale 1-2 puffs into the lungs every 6 (six) hours as needed for wheezing or shortness of breath. 09/25/19   Connye Burkitt, NP  cloNIDine (CATAPRES) 0.1 MG tablet Take 1 tablet (0.1 mg total) by mouth 2 (two) times daily. 09/25/19   Connye Burkitt, NP  clopidogrel (PLAVIX) 75 MG tablet Take 1 tablet (75 mg total) by mouth daily. 09/25/19   Connye Burkitt, NP  diltiazem (CARDIZEM CD) 120 MG 24 hr capsule Take 1 capsule (120 mg total) by mouth daily. 09/25/19   Connye Burkitt, NP  escitalopram (LEXAPRO) 10 MG tablet Take 1 tablet (10 mg total) by mouth daily. 09/25/19   Connye Burkitt, NP  gabapentin (NEURONTIN) 300 MG capsule Take 1 capsule (300 mg total) by mouth 3 (three) times daily. 09/25/19   Connye Burkitt, NP  hydrALAZINE (APRESOLINE) 50 MG tablet Take 1 tablet (50 mg total) by mouth 2 (two) times daily. 09/25/19   Connye Burkitt, NP  hydrochlorothiazide (MICROZIDE) 12.5 MG capsule Take 1 capsule (12.5 mg total) by mouth daily. 09/25/19   Connye Burkitt, NP  hydrOXYzine (ATARAX/VISTARIL) 25 MG tablet Take 1 tablet (25 mg total) by mouth 3 (three) times daily as needed for anxiety. 09/25/19   Connye Burkitt, NP  insulin aspart (NOVOLOG) 100 UNIT/ML injection Sliding scale; max 9 units TID 01/17/19   Charlott Rakes, MD  insulin glargine (LANTUS) 100 UNIT/ML injection Inject 0.15 mLs (15 Units total) into the skin at bedtime. 09/25/19   Connye Burkitt, NP  nicotine (NICODERM CQ) 7 mg/24hr patch Place 1 patch (7 mg total) onto the skin daily. 09/25/19   Connye Burkitt, NP  potassium chloride SA (KLOR-CON M20) 20  MEQ tablet Take 1 tablet (20 mEq total) by mouth daily. 09/25/19   Connye Burkitt, NP  rosuvastatin (CRESTOR) 40 MG tablet Take 1 tablet (40 mg total) by mouth daily at 6 PM. 09/25/19   Connye Burkitt, NP  traZODone (DESYREL) 50 MG tablet Take 1 tablet (50 mg total) by mouth at bedtime as needed for sleep. 09/25/19   Connye Burkitt, NP    Allergies    Prednisone, Benicar [olmesartan], Celexa [citalopram], Effexor [venlafaxine], and Shrimp [shellfish allergy]  Review of Systems   Review of Systems  Constitutional: Negative for fever.  HENT: Negative for sore throat.   Respiratory: Negative for shortness of breath.   Cardiovascular: Negative for chest pain.  Gastrointestinal: Negative  for abdominal pain and vomiting.  Genitourinary: Negative for flank pain.  Musculoskeletal: Positive for myalgias. Negative for back pain.  Skin: Negative for color change, pallor, rash and wound.  Neurological: Negative for tremors, syncope, facial asymmetry, speech difficulty, weakness, light-headedness, numbness and headaches.    Physical Exam Updated Vital Signs BP 133/64 (BP Location: Right Arm)   Pulse 64   Temp 98.4 F (36.9 C) (Oral)   Resp 15   Ht 5\' 5"  (1.651 m)   Wt 71.7 kg   SpO2 96%   BMI 26.29 kg/m   Physical Exam Vitals and nursing note reviewed.  Constitutional:      Appearance: Normal appearance.  HENT:     Head: Normocephalic and atraumatic.     Nose: Nose normal.     Mouth/Throat:     Mouth: Mucous membranes are moist.  Eyes:     Pupils: Pupils are equal, round, and reactive to light.  Pulmonary:     Effort: Pulmonary effort is normal.  Chest:     Breasts: Breasts are symmetrical.        Right: Normal. No inverted nipple, mass, nipple discharge, skin change or tenderness.        Left: Tenderness present. No inverted nipple, mass, nipple discharge or skin change.    Abdominal:     General: Abdomen is flat.  Musculoskeletal:     Cervical back: Normal range of motion  and neck supple.  Skin:    General: Skin is warm and dry.  Neurological:     Mental Status: She is alert and oriented to person, place, and time.     ED Results / Procedures / Treatments   Labs (all labs ordered are listed, but only abnormal results are displayed) Labs Reviewed  BASIC METABOLIC PANEL - Abnormal; Notable for the following components:      Result Value   Glucose, Bld 276 (*)    BUN 25 (*)    Creatinine, Ser 1.58 (*)    GFR calc non Af Amer 35 (*)    GFR calc Af Amer 41 (*)    All other components within normal limits  CBC  TROPONIN I (HIGH SENSITIVITY)  TROPONIN I (HIGH SENSITIVITY)    EKG None  Radiology DG Chest 2 View  Result Date: 09/25/2019 CLINICAL DATA:  Chest pain EXAM: CHEST - 2 VIEW COMPARISON:  September 30, 2018. FINDINGS: The heart size and mediastinal contours are within normal limits. Both lungs are clear. The visualized skeletal structures are unremarkable. IMPRESSION: No active cardiopulmonary disease. Electronically Signed   By: Constance Holster M.D.   On: 09/25/2019 16:09    Procedures Procedures (including critical care time)  Medications Ordered in ED Medications  acetaminophen (TYLENOL) tablet 650 mg (has no administration in time range)    ED Course  I have reviewed the triage vital signs and the nursing notes.  Pertinent labs & imaging results that were available during my care of the patient were reviewed by me and considered in my medical decision making (see chart for details).    MDM Rules/Calculators/A&P    Patient with a past medical history of A. fib presents to the ED with complaints of left breast pain for the past month.  She reports this is worse with movement along with lifting of her left arm.  She does have a previous history of A. fib, currently controlled on medication.  She has not tried a medication for improvement in her symptoms.  According to patient's  chart which have extensively reviewed, she was  discharged from [facility] this morning.  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  According to patient's record she does have breast cancer listed, however when this was inquired she has no recollection of this.  I am unable to find this on her records.  She does have a previous extensive history of cocaine dependence and suicidal ideation.  Patient was evaluated by me, there is some tenderness to palpation along the left breast around 12:00.  No changes in skin, nipple discharge, erythema or signs of cellulitis noted.  Suspicion for MSK versus cystic disease.  BMP was obtained prior to my evaluation, this is consistent with her previous visit.  CBC without any signs of infection.  Troponin is less than 6.  Chest x-ray without any consolation, pneumothorax.  After evaluating patient, I was advised by nursing staff the patient has left without any medical advice.  Unfortunately I have been unable to conduct my full work-up at this time, as patient has left AGAINST MEDICAL ADVICE.   Portions of this note were generated with Lobbyist. Dictation errors may occur despite best attempts at proofreading.  Final Clinical Impression(s) / ED Diagnoses Final diagnoses:  Breast pain, left    Rx / DC Orders ED Discharge Orders    None       Janeece Fitting, PA-C 09/25/19 1706    Janeece Fitting, PA-C 09/25/19 1706    Gareth Morgan, MD 10/03/19 1328

## 2019-09-25 NOTE — ED Triage Notes (Addendum)
Pt reports pain in left breast x1 month. Pt reports pain radiates around breast and back upon movement. Pt has cardiac hx.

## 2019-09-25 NOTE — Progress Notes (Signed)
  [facility] Adult Case Management Discharge Plan :  Will you be returning to the same living situation after discharge:  No. Patient was given housing resources for shelters. Patient was also referred to [facility] for residential treatment. Patient declined going to the [facility] for additional housing resources and services.  At discharge, do you have transportation home?: No. Patient declined any transportation services. Reports she prefers to walk.  Do you have the ability to pay for your medications: No.  Release of information consent forms completed and in the chart;  Patient's signature needed at discharge.  Patient to Follow up at: [facility].   Why: A referral for residential substance use treatment was faxed on your behalf on 09/23/19. Please follow up to determine when a bed will become available on a DAILY BASIS.  Contact information: [place] [address] [phone]        [facility] Follow up.   Why: Walk-in hours are Monday-Friday from 8:00am-5:00pm. Please be sure to contact agency for outpatient medication management and therapy services. Be sure to provide any discharge paperwork, including your list of medications.  Contact information: [address] [place] [address] [phone]           Next level of care provider has access to [place] and Suicide Prevention discussed: Yes,  with the patient     Has patient been referred to the Quitline?: Patient refused referral  Patient has been referred for addiction treatment: Yes  Lisa Anderson, [place] 09/25/2019, 1:32 PM

## 2019-09-25 NOTE — ED Notes (Signed)
Pt left before receiving Tylenol or discharge paperwork.

## 2019-09-25 NOTE — [facility] Suicide Risk Assessment (Signed)
[facility] Discharge Suicide Risk Assessment   Principal Problem: <principal problem not specified> Discharge Diagnoses: Active Problems:   MDD (major depressive disorder)   Cocaine dependence with cocaine-induced mood disorder ([place])   Total Time spent with patient: 20 minutes  Musculoskeletal: Strength & Muscle Tone: within normal limits Gait & Station: normal Patient leans: N/A  Psychiatric Specialty Exam: Review of Systems  All other systems reviewed and are negative.   Blood pressure 130/65, pulse 69, temperature 98.1 F (36.7 C), resp. rate 16, SpO2 99 %.There is no height or weight on file to calculate BMI.  General Appearance: Casual  Eye Contact::  Fair  Speech:  Normal Rate409  Volume:  Normal  Mood:  Anxious  Affect:  Congruent  Thought Process:  Coherent and Descriptions of Associations: Intact  Orientation:  Full (Time, Place, and Person)  Thought Content:  Logical  Suicidal Thoughts:  No  Homicidal Thoughts:  No  Memory:  Immediate;   Fair Recent;   Fair Remote;   Fair  Judgement:  Intact  Insight:  Lacking  Psychomotor Activity:  Normal  Concentration:  Fair  Recall:  [employer] of Knowledge:Fair  Language: Good  Akathisia:  Negative  Handed:  Right  AIMS (if indicated):     Assets:  Desire for Improvement Resilience  Sleep:  Number of Hours: 6.75  Cognition: WNL  ADL's:  Intact   Mental Status Per Nursing Assessment::   On Admission:  Self-harm thoughts, Suicidal ideation indicated by patient  Demographic Factors:  Low socioeconomic status, Living alone and Unemployed  Loss Factors: Financial problems/change in socioeconomic status  Historical Factors: Impulsivity  Risk Reduction Factors:   NA  Continued Clinical Symptoms:  Depression:   Comorbid alcohol abuse/dependence Impulsivity Alcohol/Substance Abuse/Dependencies  Cognitive Features That Contribute To Risk:  None    Suicide Risk:  Minimal: No identifiable suicidal ideation.   Patients presenting with no risk factors but with morbid ruminations; may be classified as minimal risk based on the severity of the depressive symptoms  [facility] Follow up.   Why: A referral for residential substance use treatment was faxed on your behalf on 09/23/19. Contact information: [place] [id number]           Plan Of Care/Follow-up recommendations:  Activity:  ad lib  Sharma Covert, MD 09/25/2019, 8:08 AM

## 2019-09-25 NOTE — Progress Notes (Signed)
Recreation Therapy Notes  Date:  12.30.20 Time: 0930 Location: [address] Dayroom  Group Topic: Stress Management  Goal Area(s) Addresses:  Patient will identify positive stress management techniques. Patient will identify benefits of using stress management post d/c.  Behavioral Response:  Engaged  Intervention: Stress Management  Activity :  Guided Imagery.  LRT read a script that guided patients on a journey to their peaceful place.  Patients were to envision all of the things their peaceful place has to offer.  Patients were to listen and follow along as script was read.  Education:  Stress Management, Discharge Planning.   Education Outcome: Acknowledges Education  Clinical Observations/Feedback: Pt attended and participated in activity.    Victorino Sparrow, LRT/CTRS         Victorino Sparrow A 09/25/2019 10:57 AM

## 2019-09-25 NOTE — Progress Notes (Signed)
D: Pt A & O X 4. Denies SI, HI, AVH and pain at this time "I just want to go over to the hospital across the street". Pt presents with blunted affect and irritable mood with logical / fair speech. D/C as ordered. Instructions given to [facility] upon pt's request.  A: D/C instructions reviewed with pt including prescriptions, medication samples and follow up appointments ([facility], [facility]); compliance encouraged. All belongings from locker 20 returned to pt at time of departure. Scheduled medications administered with verbal education and effects monitored. Safety checks maintained at Q 15 minutes intervals without incident till time of d/c.  R: Pt receptive to care. Compliant with medications when offered. Denies adverse drug reactions when assessed. Verbalized understanding related to d/c instructions. Signed belonging sheet in agreement with items received from locker. Ambulatory with a steady gait. Appears to be in no physical distress at time of departure.

## 2019-09-25 NOTE — Progress Notes (Signed)
CSW met with the patient to discuss discharge planning. The patient appeared very irritable and provided curt responses throughout the conversation with this clinician.   CSW informed the patient the referral made to [facility] was still under review and at this time there are no current beds available at their facility. CSW asked the patient if she had followed up on the housing/shelter resources provided. She reports she has been in contact with multiple shelters, however there are no current beds available at this time.   CSW shared with the patient an alternative plan of discharging to the [facility] for additional resources and contacts for possible shelter beds.   The patient declined and stated that she just wanted to "get out of this building and walk so I can be near a hospital if something happens, like I walk into a car or something". CSW asked the patient if she was experiencing current suicidal ideation, however the patient walked off and began to gather her belongings. CSW shared that transportation could be provided to the [facility], however the patient declined those services as well.   The patient continues to express that she wants to discharge. She denied any transportation services or any additional resources or follow up appointments.    Dr. Myles Lipps, MD notified.  Harriett Sine, NP notified Darrol Angel, RN notified.         Radonna Ricker, MSW, LCSW Clinical Social Worker [facility]  Phone: [phone]

## 2019-09-25 NOTE — Progress Notes (Signed)
   09/25/19 1000  Psych Admission Type (Psych Patients Only)  Admission Status Voluntary  Psychosocial Assessment  Patient Complaints None  Eye Contact Fair  Facial Expression Anxious  Affect Appropriate to circumstance  Speech Logical/coherent  Interaction Assertive  Motor Activity Other (Comment) (WNL)  Appearance/Hygiene Unremarkable  Behavior Characteristics Appropriate to situation  Mood Anxious  Aggressive Behavior  Effect No apparent injury  Thought Process  Coherency WDL  Content WDL  Delusions None reported or observed  Perception WDL  Hallucination None reported or observed  Judgment WDL  Confusion None  Danger to Self  Current suicidal ideation? Denies  Self-Injurious Behavior No self-injurious ideation or behavior indicators observed or expressed   Danger to Others  Danger to Others None reported or observed

## 2019-09-25 NOTE — Progress Notes (Signed)
   09/25/19 0138  Psych Admission Type (Psych Patients Only)  Admission Status Voluntary  Psychosocial Assessment  Patient Complaints Anxiety  Eye Contact Brief  Facial Expression Flat  Affect Appropriate to circumstance;Depressed;Sad  Speech Logical/coherent  Interaction Guarded  Motor Activity Other (Comment) (steady gait)  Appearance/Hygiene Unremarkable  Behavior Characteristics Cooperative  Mood Depressed  Aggressive Behavior  Effect No apparent injury  Thought Process  Coherency WDL  Content WDL  Delusions WDL  Perception WDL  Hallucination None reported or observed  Judgment Impaired  Confusion None  Danger to Self  Current suicidal ideation? Denies  Self-Injurious Behavior No self-injurious ideation or behavior indicators observed or expressed   Agreement Not to Harm Self Yes  Description of Agreement Verbal  Danger to Others  Danger to Others None reported or observed  D: Patient A & O x 4. Pt cooperative, reports she had a good day.  A: Medications administered as prescribed. Support and encouragement provided as needed.  R: Patient remains safe on the unit. Will continue to monitor for safety and stability.

## 2019-09-25 NOTE — Progress Notes (Signed)
Inpatient Diabetes Program Recommendations  AACE/ADA: New Consensus Statement on Inpatient Glycemic Control (2015)  Target Ranges:  Prepandial:   less than 140 mg/dL      Peak postprandial:   less than 180 mg/dL (1-2 hours)      Critically ill patients:  140 - 180 mg/dL   Lab Results  Component Value Date   GLUCAP 204 (H) 09/25/2019   HGBA1C 9.3 (H) 09/22/2019    Review of Glycemic Control  Post-prandials elevated. Needs meal coverage insulin.  Inpatient Diabetes Program Recommendations:     Add Novolog 4 units tidwc for meal coverage insulin if pt eats > 50% meal  Will continue to follow glucose trends.  Thank you. Lorenda Peck, RD, LDN, CDE Inpatient Diabetes Coordinator [phone]

## 2019-09-30 ENCOUNTER — Ambulatory Visit: Payer: Medicaid Other | Admitting: Family Medicine

## 2019-10-15 MED FILL — $novoLOG 100 UNITS/ML VIAL: 100 | 28 days supply | Qty: 10 | Fill #2

## 2019-10-30 ENCOUNTER — Ambulatory Visit: Payer: Self-pay | Attending: Family Medicine | Admitting: Family Medicine

## 2019-10-30 ENCOUNTER — Other Ambulatory Visit: Payer: Self-pay

## 2019-10-30 ENCOUNTER — Encounter: Payer: Self-pay | Admitting: Family Medicine

## 2019-10-30 VITALS — BP 139/74 | HR 76 | Ht 65.0 in | Wt 163.0 lb

## 2019-10-30 DIAGNOSIS — R29898 Other symptoms and signs involving the musculoskeletal system: Secondary | ICD-10-CM

## 2019-10-30 DIAGNOSIS — Z794 Long term (current) use of insulin: Secondary | ICD-10-CM

## 2019-10-30 DIAGNOSIS — Z1231 Encounter for screening mammogram for malignant neoplasm of breast: Secondary | ICD-10-CM

## 2019-10-30 DIAGNOSIS — Z8673 Personal history of transient ischemic attack (TIA), and cerebral infarction without residual deficits: Secondary | ICD-10-CM

## 2019-10-30 DIAGNOSIS — I5032 Chronic diastolic (congestive) heart failure: Secondary | ICD-10-CM

## 2019-10-30 DIAGNOSIS — F339 Major depressive disorder, recurrent, unspecified: Secondary | ICD-10-CM

## 2019-10-30 DIAGNOSIS — E1121 Type 2 diabetes mellitus with diabetic nephropathy: Secondary | ICD-10-CM

## 2019-10-30 DIAGNOSIS — E1151 Type 2 diabetes mellitus with 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 gangrene: Secondary | ICD-10-CM

## 2019-10-30 DIAGNOSIS — I110 Hypertensive heart disease with heart failure: Secondary | ICD-10-CM

## 2019-10-30 LAB — GLUCOSE, POCT (MANUAL RESULT ENTRY): POC Glucose: 290 mg/dl — AB (ref 70–99)

## 2019-10-30 MED ORDER — CLONIDINE HCL 0.1 MG PO TABS: 0.1000 mg | ORAL_TABLET | Freq: Two times a day (BID) | ORAL | 6 refills | Status: DC

## 2019-10-30 MED ORDER — GABAPENTIN 300 MG PO CAPS: 300.0000 mg | ORAL_CAPSULE | Freq: Three times a day (TID) | ORAL | 6 refills | Status: DC

## 2019-10-30 MED ORDER — INSULIN GLARGINE 100 UNIT/ML ~~LOC~~ SOLN: 15.0000 [IU] | Freq: Every day | SUBCUTANEOUS | 6 refills | Status: DC

## 2019-10-30 MED ORDER — DILTIAZEM HCL ER COATED BEADS 120 MG PO CP24: 120.0000 mg | ORAL_CAPSULE | Freq: Every day | ORAL | 6 refills | Status: DC

## 2019-10-30 MED ORDER — ROSUVASTATIN CALCIUM 40 MG PO TABS: 40.0000 mg | ORAL_TABLET | Freq: Every day | ORAL | 6 refills | Status: DC

## 2019-10-30 MED ORDER — GLIPIZIDE 5 MG PO TABS: 5.0000 mg | ORAL_TABLET | Freq: Two times a day (BID) | ORAL | 6 refills | Status: DC

## 2019-10-30 MED ORDER — ESCITALOPRAM OXALATE 10 MG PO TABS: 10.0000 mg | ORAL_TABLET | Freq: Every day | ORAL | 6 refills | Status: DC

## 2019-10-30 MED ORDER — CLOPIDOGREL BISULFATE 75 MG PO TABS: 75.0000 mg | ORAL_TABLET | Freq: Every day | ORAL | 6 refills | Status: DC

## 2019-10-30 MED ORDER — HYDRALAZINE HCL 50 MG PO TABS: 50.0000 mg | ORAL_TABLET | Freq: Two times a day (BID) | ORAL | 6 refills | Status: DC

## 2019-10-30 MED ORDER — HYDROCHLOROTHIAZIDE 12.5 MG PO CAPS: 12.5000 mg | ORAL_CAPSULE | Freq: Every day | ORAL | 6 refills | Status: DC

## 2019-10-30 NOTE — Progress Notes (Signed)
Subjective:  Patient ID: Lisa Anderson, female    DOB: May 16, 1956  Age: 64 y.o. MRN: [id number]  CC: Diabetes   HPI Lisa Anderson is 64 year old female with a history of type 2 diabetes mellitus (A1c 9.3), hypertension, depression, Diastolic CHF (EF 60 is to 65% on 2-D echo 11/2017) right parieto occipital CVA in 07/2015, status post implantable loop recorder due to cryptogenic stroke, TIA in 11/2017, substance abuse who is here for a follow-up visit she was last seen in the clinic in 03/2018. Hospitalized at behavioral health on 09/20/2019 for cocaine induced mood disorder and major depressive disorder.  Her medications were optimized and she was discharged with recommendation to follow-up with [facility] but she is yet to do so. She is in the [place] [facility] and has not used any drugs lately.  She complains legs feel weak and don't want to carry her.She has pain in her legs 9/10 when it happens and is more at night.  Symptoms have been present for the last couple of weeks and she denies excessive alcohol consumption.  She is on gabapentin for diabetic neuropathy. She had been off her medications until her hospitalization in 08/2019 due to difficulty affording them. She now has a job and will be able to afford her medications going forward. She denies dyspnea, chest pain, pedal edema.  Past Medical History:  Diagnosis Date  . Asthma   . Cancer ([place])    breast  . Chronic congestive heart failure with left ventricular diastolic dysfunction ([place])   . Diabetes mellitus   . Hyperlipidemia   . Hypertension   . Stroke [facility])     Past Surgical History:  Procedure Laterality Date  . BREAST LUMPECTOMY    . EP IMPLANTABLE DEVICE N/A 08/07/2015   Procedure: Loop Recorder Insertion;  Surgeon: Thompson Grayer, MD;  Location: [place] [facility];  Service: Cardiovascular;  Laterality: N/A;  . TEE WITHOUT CARDIOVERSION N/A 08/07/2015   Procedure: TRANSESOPHAGEAL ECHOCARDIOGRAM (TEE);  Surgeon:  Sueanne Margarita, MD;  Location: [facility] ENDOSCOPY;  Service: Cardiovascular;  Laterality: N/A;  . TONSILLECTOMY      Family History  Problem Relation Age of Onset  . Diabetes Mother   . Hyperlipidemia Mother   . Hypertension Mother   . Breast cancer Mother   . Rheum arthritis Father   . Asthma Father   . Stroke Father   . Thyroid disease Father   . Thyroid disease Brother   . Colon cancer Brother        in his 4s  . Breast cancer Sister     Allergies  Allergen Reactions  . Prednisone Hives and Other (See Comments)    Made her "crazy", suicidal  . Benicar [Olmesartan] Other (See Comments)    Did not work at all for patient  . Celexa [Citalopram] Other (See Comments)    Made patient feel crazy, fluoxetine is fine  . Effexor [Venlafaxine] Other (See Comments)    Made patient feel crazy  . Shrimp [Shellfish Allergy] Hives and Swelling    Outpatient Medications Prior to Visit  Medication Sig Dispense Refill  . albuterol (VENTOLIN HFA) 108 (90 Base) MCG/ACT inhaler Inhale 1-2 puffs into the lungs every 6 (six) hours as needed for wheezing or shortness of breath. 6.7 g 0  . cloNIDine (CATAPRES) 0.1 MG tablet Take 1 tablet (0.1 mg total) by mouth 2 (two) times daily. 60 tablet 0  . clopidogrel (PLAVIX) 75 MG tablet Take 1 tablet (75 mg total) by  mouth daily. 30 tablet 0  . diltiazem (CARDIZEM CD) 120 MG 24 hr capsule Take 1 capsule (120 mg total) by mouth daily. 30 capsule 0  . escitalopram (LEXAPRO) 10 MG tablet Take 1 tablet (10 mg total) by mouth daily. 30 tablet 0  . gabapentin (NEURONTIN) 300 MG capsule Take 1 capsule (300 mg total) by mouth 3 (three) times daily. 90 capsule 0  . hydrALAZINE (APRESOLINE) 50 MG tablet Take 1 tablet (50 mg total) by mouth 2 (two) times daily. 60 tablet 0  . hydrochlorothiazide (MICROZIDE) 12.5 MG capsule Take 1 capsule (12.5 mg total) by mouth daily. 30 capsule 0  . hydrOXYzine (ATARAX/VISTARIL) 25 MG tablet Take 1 tablet (25 mg total) by mouth 3  (three) times daily as needed for anxiety. 30 tablet 0  . insulin aspart (NOVOLOG) 100 UNIT/ML injection Sliding scale; max 9 units TID 10 mL 3  . insulin glargine (LANTUS) 100 UNIT/ML injection Inject 0.15 mLs (15 Units total) into the skin at bedtime. 5 mL 0  . nicotine (NICODERM CQ) 7 mg/24hr patch Place 1 patch (7 mg total) onto the skin daily. 28 patch 0  . potassium chloride SA (KLOR-CON M20) 20 MEQ tablet Take 1 tablet (20 mEq total) by mouth daily. 30 tablet 0  . rosuvastatin (CRESTOR) 40 MG tablet Take 1 tablet (40 mg total) by mouth daily at 6 PM. 30 tablet 0  . traZODone (DESYREL) 50 MG tablet Take 1 tablet (50 mg total) by mouth at bedtime as needed for sleep. 15 tablet 0   No facility-administered medications prior to visit.     ROS Review of Systems  Constitutional: Negative for activity change, appetite change and fatigue.  HENT: Negative for congestion, sinus pressure and sore throat.   Eyes: Negative for visual disturbance.  Respiratory: Negative for cough, chest tightness, shortness of breath and wheezing.   Cardiovascular: Negative for chest pain and palpitations.  Gastrointestinal: Negative for abdominal distention, abdominal pain and constipation.  Endocrine: Negative for polydipsia.  Genitourinary: Negative for dysuria and frequency.  Musculoskeletal:       See HPI  Skin: Negative for rash.  Neurological: Negative for tremors, light-headedness and numbness.  Hematological: Does not bruise/bleed easily.  Psychiatric/Behavioral: Negative for agitation and behavioral problems.    Objective:  BP 139/74   Pulse 76   Ht 5\' 5"  (1.651 m)   Wt 163 lb (73.9 kg)   SpO2 97%   BMI 27.12 kg/m   BP/Weight 10/30/2019 09/25/2019 [id number]  Systolic BP [id number] [id number]  Diastolic BP 74 64 72  Wt. (Lbs) 163 158 -  BMI 27.12 26.29 -  Some encounter information is confidential and restricted. Go to Review Flowsheets activity to see all data.      Physical  Exam Constitutional:      Appearance: She is well-developed.  Neck:     Vascular: No JVD.  Cardiovascular:     Rate and Rhythm: Normal rate.     Heart sounds: Normal heart sounds. No murmur.  Pulmonary:     Effort: Pulmonary effort is normal.     Breath sounds: Normal breath sounds. No wheezing or rales.  Chest:     Chest wall: No tenderness.  Abdominal:     General: Bowel sounds are normal. There is no distension.     Palpations: Abdomen is soft. There is no mass.     Tenderness: There is no abdominal tenderness.  Musculoskeletal:        General: Normal range of  motion.     Right lower leg: No edema.     Left lower leg: No edema.  Neurological:     Mental Status: She is alert and oriented to person, place, and time.     Comments: Strength-4+/5 in bilateral lower extremities 5/5 in bilateral upper Extremities  Psychiatric:        Mood and Affect: Mood normal.     CMP Latest Ref Rng & Units 09/25/2019 09/25/2019 09/23/2019  Glucose 70 - 99 mg/dL 276(H) 232(H) 135(H)  BUN 8 - 23 mg/dL 25(H) 23 18  Creatinine 0.44 - 1.00 mg/dL 1.58(H) 1.36(H) 1.28(H)  Sodium 135 - 145 mmol/L 136 139 140  Potassium 3.5 - 5.1 mmol/L 4.4 3.7 3.4(L)  Chloride 98 - 111 mmol/L 99 101 101  CO2 22 - 32 mmol/L 29 30 28   Calcium 8.9 - 10.3 mg/dL 9.0 9.0 9.1  Total Protein 6.5 - 8.1 g/dL - 6.8 -  Total Bilirubin 0.3 - 1.2 mg/dL - 0.2(L) -  Alkaline Phos 38 - 126 U/L - 95 -  AST 15 - 41 U/L - 11(L) -  ALT 0 - 44 U/L - 11 -    Lipid Panel     Component Value Date/Time   CHOL 211 (H) 09/22/2019 1753   CHOL 209 (H) 01/16/2019 1052   TRIG 185 (H) 09/22/2019 1753   HDL 41 09/22/2019 1753   HDL 48 01/16/2019 1052   CHOLHDL 5.1 09/22/2019 1753   VLDL 37 09/22/2019 1753   LDLCALC 133 (H) 09/22/2019 1753   LDLCALC 139 (H) 01/16/2019 1052    CBC    Component Value Date/Time   WBC 7.7 09/25/2019 1532   RBC 4.42 09/25/2019 1532   HGB 12.0 09/25/2019 1532   HGB 12.1 01/16/2019 1052   HCT 39.1  09/25/2019 1532   HCT 37.4 01/16/2019 1052   PLT 261 09/25/2019 1532   PLT 286 01/16/2019 1052   MCV 88.5 09/25/2019 1532   MCV 84 01/16/2019 1052   MCH 27.1 09/25/2019 1532   MCHC 30.7 09/25/2019 1532   RDW 13.4 09/25/2019 1532   RDW 13.4 01/16/2019 1052   LYMPHSABS 2.1 01/16/2019 1052   MONOABS 0.6 09/18/2018 1612   EOSABS 1.0 (H) 01/16/2019 1052   BASOSABS 0.1 01/16/2019 1052    Lab Results  Component Value Date   HGBA1C 9.3 (H) 09/22/2019    Assessment & Plan:   1. Type 2 diabetes mellitus with 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 gangrene, with long-term current use of insulin (HCC) Uncontrolled with A1c of 9.3 This is largely due to previous noncompliance She is doing better now from a compliance standpoint Counseled on Diabetic diet, my plate method, [id number] minutes of moderate intensity exercise/week Blood sugar logs with fasting goals of 80-120 mg/dl, random of less than 180 and in the event of sugars less than 60 mg/dl or greater than 400 mg/dl encouraged to notify the clinic. Advised on the need for annual eye exams, annual foot exams, Pneumonia vaccine. - POCT glucose (manual entry) - glipiZIDE (GLUCOTROL) 5 MG tablet; Take 1 tablet (5 mg total) by mouth 2 (two) times daily before a meal.  Dispense: 60 tablet; Refill: 6 - insulin glargine (LANTUS) 100 UNIT/ML injection; Inject 0.15 mLs (15 Units total) into the skin at bedtime.  Dispense: 5 mL; Refill: 6 - rosuvastatin (CRESTOR) 40 MG tablet; Take 1 tablet (40 mg total) by mouth daily at 6 PM.  Dispense: 30 tablet; Refill: 6  2. History of stroke No residual deficits -  clopidogrel (PLAVIX) 75 MG tablet; Take 1 tablet (75 mg total) by mouth daily.  Dispense: 30 tablet; Refill: 6  3. 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 Encounter for screening mammogram for malignant neoplasm of breast - MM 3D SCREEN BREAST BILATERAL; Future  5. Hypertensive heart disease with chronic diastolic congestive heart failure ([place]) Euvolemic with EF of 60 to 65% from 11/2017 Not on ARB due to allergy Consider switching from Cardizem to beta-blocker at next visit - cloNIDine (CATAPRES) 0.1 MG tablet; Take 1 tablet (0.1 mg total) by mouth 2 (two) times daily.  Dispense: 60 tablet; Refill: 6 - diltiazem (CARDIZEM CD) 120 MG 24 hr capsule; Take 1 capsule (120 mg total) by mouth daily.  Dispense: 30 capsule; Refill: 6 - hydrochlorothiazide (MICROZIDE) 12.5 MG capsule; Take 1 capsule (12.5 mg total) by mouth daily.  Dispense: 30 capsule; Refill: 6 - hydrALAZINE (APRESOLINE) 50 MG tablet; Take 1 tablet (50 mg total) by mouth 2 (two) times daily.  Dispense: 60 tablet; Refill: 6  6. Recurrent major depressive disorder, remission status unspecified ([place]) Stable at this time She never followed up with [facility] as advised at discharge; she has been encouraged to do so - escitalopram (LEXAPRO) 10 MG tablet; Take 1 tablet (10 mg total) by mouth daily.  Dispense: 30 tablet; Refill: 6  7. Diabetic nephropathy associated with type 2 diabetes mellitus (HCC) Controlled on gabapentin - gabapentin (NEURONTIN) 300 MG capsule; Take 1 capsule (300 mg total) by mouth 3 (three) times daily.  Dispense: 90 capsule; Refill: 6   Return in about 3 months (around 01/27/2020) for Chronic medical conditions.   Charlott Rakes, MD, FAAFP. [facility] and [place], [place]   10/30/2019, 3:08 PM

## 2019-10-30 NOTE — Progress Notes (Signed)
Patient states that legs get tired.

## 2019-10-30 NOTE — Patient Instructions (Signed)
Once I review your test results, I will be in touch with you via 'my chart' or a phone call from my office (if you are not signed up for my chart).  

## 2019-10-31 ENCOUNTER — Telehealth: Payer: Self-pay

## 2019-10-31 LAB — BASIC METABOLIC PANEL
BUN/Creatinine Ratio: 11 — ABNORMAL LOW (ref 12–28)
BUN: 14 mg/dL (ref 8–27)
CO2: 27 mmol/L (ref 20–29)
Calcium: 9.3 mg/dL (ref 8.7–10.3)
Chloride: 100 mmol/L (ref 96–106)
Creatinine, Ser: 1.28 mg/dL — ABNORMAL HIGH (ref 0.57–1.00)
GFR calc Af Amer: 52 mL/min/{1.73_m2} — ABNORMAL LOW (ref >59)
GFR calc non Af Amer: 45 mL/min/{1.73_m2} — ABNORMAL LOW (ref >59)
Glucose: 230 mg/dL — ABNORMAL HIGH (ref 65–99)
Potassium: 3.7 mmol/L (ref 3.5–5.2)
Sodium: 141 mmol/L (ref 134–144)

## 2019-10-31 LAB — CK: Total CK: 70 U/L (ref 32–182)

## 2019-10-31 LAB — ALDOLASE: Aldolase: 3.5 U/L (ref 3.3–10.3)

## 2019-10-31 LAB — VITAMIN D 25 HYDROXY (VIT D DEFICIENCY, FRACTURES): Vit D, 25-Hydroxy: 42.5 ng/mL (ref 30.0–100.0)

## 2019-10-31 NOTE — Telephone Encounter (Signed)
Patient was called and there is no voicemail set up to leave a message. 

## 2019-10-31 NOTE — Telephone Encounter (Signed)
-----   Message from Charlott Rakes, MD sent at 10/31/2019  2:30 PM EST ----- Vitamin D is normal, other labs are stable but glucose is elevated.  Please advised to comply with diabetic regimen.  Labs do not explain the cause of her lower extremity weakness.  I will recommend adherence to her diabetic regimen and regular home exercise as tolerated.

## 2019-11-01 NOTE — Telephone Encounter (Signed)
Patient was called and a voicemail was left informing patient to return phone call for lab results. 

## 2019-11-07 NOTE — Telephone Encounter (Signed)
Patient was called and a voicemail was left informing patient to return phone call for lab results. 

## 2019-11-08 NOTE — Telephone Encounter (Signed)
Patient called to receive her most recent lab results. Patient was informed of pcp note. The patient verbalized understanding and stated she had no further questions.

## 2019-12-18 ENCOUNTER — Other Ambulatory Visit: Payer: Self-pay

## 2019-12-18 ENCOUNTER — Ambulatory Visit
Admission: RE | Admit: 2019-12-18 | Discharge: 2019-12-18 | Disposition: A | Discharge status: Home / Self Care | Payer: No Typology Code available for payment source | Source: Ambulatory Visit | Attending: Family Medicine | Admitting: Family Medicine

## 2019-12-18 DIAGNOSIS — Z1231 Encounter for screening mammogram for malignant neoplasm of breast: Secondary | ICD-10-CM

## 2019-12-20 ENCOUNTER — Other Ambulatory Visit: Payer: Self-pay | Admitting: Family Medicine

## 2019-12-20 DIAGNOSIS — R928 Other abnormal and inconclusive findings on diagnostic imaging of breast: Secondary | ICD-10-CM

## 2019-12-23 ENCOUNTER — Telehealth: Payer: Self-pay

## 2019-12-23 NOTE — Telephone Encounter (Signed)
Patient was called and a voicemail was left informing patient to return phone call for lab results. 

## 2019-12-23 NOTE — Telephone Encounter (Signed)
-----   Message from Charlott Rakes, MD sent at 12/23/2019 12:15 PM EDT ----- The breast center will be in touch with her regarding additional imaging due to abnormal mammogram findings

## 2019-12-24 ENCOUNTER — Telehealth: Payer: Self-pay

## 2019-12-24 NOTE — Telephone Encounter (Signed)
-----   Message from Charlott Rakes, MD sent at 12/23/2019 12:15 PM EDT ----- The breast center will be in touch with her regarding additional imaging due to abnormal mammogram findings

## 2019-12-24 NOTE — Telephone Encounter (Signed)
Patient name and DOB has been verified Patient was informed of lab results. Patient had no questions.  

## 2020-01-06 ENCOUNTER — Ambulatory Visit
Admission: RE | Admit: 2020-01-06 | Discharge: 2020-01-06 | Disposition: A | Discharge status: Home / Self Care | Payer: No Typology Code available for payment source | Source: Ambulatory Visit | Attending: Family Medicine | Admitting: Family Medicine

## 2020-01-06 ENCOUNTER — Other Ambulatory Visit: Payer: Self-pay

## 2020-01-06 ENCOUNTER — Ambulatory Visit: Admission: RE | Admit: 2020-01-06 | Payer: No Typology Code available for payment source | Source: Ambulatory Visit

## 2020-01-06 DIAGNOSIS — R928 Other abnormal and inconclusive findings on diagnostic imaging of breast: Secondary | ICD-10-CM

## 2020-01-10 ENCOUNTER — Telehealth: Payer: Self-pay

## 2020-01-10 NOTE — Telephone Encounter (Signed)
Patient name and DOB has been verified Patient was informed of lab results. Patient had no questions.  

## 2020-01-10 NOTE — Telephone Encounter (Signed)
-----   Message from Charlott Rakes, MD sent at 01/09/2020  9:01 AM EDT ----- Diagnostic mammogram is negative for malignancy

## 2020-01-29 ENCOUNTER — Ambulatory Visit: Payer: Medicaid Other | Admitting: Family Medicine

## 2020-02-06 ENCOUNTER — Other Ambulatory Visit: Payer: Self-pay | Admitting: Family Medicine

## 2020-02-06 DIAGNOSIS — Z794 Long term (current) use of insulin: Secondary | ICD-10-CM

## 2020-02-13 ENCOUNTER — Ambulatory Visit: Payer: No Typology Code available for payment source | Attending: Family Medicine | Admitting: Family Medicine

## 2020-02-13 ENCOUNTER — Other Ambulatory Visit: Payer: Self-pay

## 2020-02-13 ENCOUNTER — Encounter: Payer: Self-pay | Admitting: Family Medicine

## 2020-02-13 VITALS — BP 169/70 | HR 72 | Ht 65.0 in | Wt 168.2 lb

## 2020-02-13 DIAGNOSIS — I5032 Chronic diastolic (congestive) heart failure: Secondary | ICD-10-CM

## 2020-02-13 DIAGNOSIS — E1151 Type 2 diabetes mellitus with 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 gangrene: Secondary | ICD-10-CM

## 2020-02-13 DIAGNOSIS — I110 Hypertensive heart disease with heart failure: Secondary | ICD-10-CM

## 2020-02-13 DIAGNOSIS — Z794 Long term (current) use of insulin: Secondary | ICD-10-CM

## 2020-02-13 DIAGNOSIS — Z8673 Personal history of transient ischemic attack (TIA), and cerebral infarction without residual deficits: Secondary | ICD-10-CM

## 2020-02-13 DIAGNOSIS — K0889 Other specified disorders of teeth and supporting structures: Secondary | ICD-10-CM

## 2020-02-13 DIAGNOSIS — F339 Major depressive disorder, recurrent, unspecified: Secondary | ICD-10-CM | POA: Diagnosis not present

## 2020-02-13 DIAGNOSIS — R140 Abdominal distension (gaseous): Secondary | ICD-10-CM

## 2020-02-13 LAB — POCT GLYCOSYLATED HEMOGLOBIN (HGB A1C): HbA1c, POC (controlled diabetic range): 9.4 % — AB (ref 0.0–7.0)

## 2020-02-13 LAB — GLUCOSE, POCT (MANUAL RESULT ENTRY): POC Glucose: 317 mg/dl — AB (ref 70–99)

## 2020-02-13 MED ORDER — HYDRALAZINE HCL 100 MG PO TABS: 100.0000 mg | ORAL_TABLET | Freq: Two times a day (BID) | ORAL | 6 refills | Status: DC

## 2020-02-13 MED ORDER — POTASSIUM CHLORIDE CRYS ER 20 MEQ PO TBCR: 20.0000 meq | EXTENDED_RELEASE_TABLET | Freq: Every day | ORAL | 6 refills | Status: DC

## 2020-02-13 MED ORDER — GLIPIZIDE 10 MG PO TABS: 10.0000 mg | ORAL_TABLET | Freq: Two times a day (BID) | ORAL | 6 refills | Status: DC

## 2020-02-13 MED ORDER — ESCITALOPRAM OXALATE 20 MG PO TABS: 20.0000 mg | ORAL_TABLET | Freq: Every day | ORAL | 6 refills | Status: DC

## 2020-02-13 MED ORDER — AMOXICILLIN 500 MG PO CAPS: 500.0000 mg | ORAL_CAPSULE | Freq: Three times a day (TID) | ORAL | 0 refills | Status: DC

## 2020-02-13 NOTE — Progress Notes (Signed)
Subjective:  Patient ID: Lisa Anderson, female    DOB: December 26, 1955  Age: 64 y.o. MRN: [id number]  CC: Diabetes   HPI Lisa Anderson is 64 year old female with a history of type 2 diabetes mellitus (A1c9.4), hypertension, depression, Diastolic CHF (EF 60 is to 65% on 2-D echo 11/2017) right parieto occipital CVA in 07/2015, status post implantable loop recorder due to cryptogenic stroke, TIA in 11/2017, substance abusewho is here for a follow-up visit.  Currently residing at the [facility]. Feels like something is crawling in her ears but denies presence of pain, hearing abnormality or sinus symptoms. Her teeth are bad and painful and Lisa Anderson needs antibiotics prior to seeing a dentist. Her abdomen gets bloated even when Lisa Anderson just drinks water and Lisa Anderson has reflux. When her stomach is empty Lisa Anderson is asymptomatic. Endorses constipation which resolves  Lisa Anderson was referred for colonoscopy on different occasions but never made it clear as Lisa Anderson states Lisa Anderson "was on the streets" and did not care for her appointments then. Positive for H.ppylori in 06/2017. Taking Pepcid with no relief.  Diagnosed with hypokalemia at [facility] and is needing refill on potassium tablets. Her A1c is 9.4 and Lisa Anderson endorses drinking a lot of soda due to the carbonated effect which is beneficial for her abdominal symptoms.  Lisa Anderson is compliant with her diabetic medications. Her left 4th and 5th toe get numb but Lisa Anderson denies neuropathy in other body parts.  Lisa Anderson does not have visual concerns but uses reading glasses.  Not up-to-date on eye exams. Past Medical History:  Diagnosis Date  . Asthma   . Cancer ([place])    breast  . Chronic congestive heart failure with left ventricular diastolic dysfunction ([place])   . Diabetes mellitus   . Hyperlipidemia   . Hypertension   . Stroke [facility])     Past Surgical History:  Procedure Laterality Date  . BREAST BIOPSY Right 2007  . BREAST LUMPECTOMY Right 2007  . EP IMPLANTABLE DEVICE N/A 08/07/2015     Procedure: Loop Recorder Insertion;  Surgeon: Thompson Grayer, MD;  Location: [place] [facility];  Service: Cardiovascular;  Laterality: N/A;  . TEE WITHOUT CARDIOVERSION N/A 08/07/2015   Procedure: TRANSESOPHAGEAL ECHOCARDIOGRAM (TEE);  Surgeon: Sueanne Margarita, MD;  Location: [facility] ENDOSCOPY;  Service: Cardiovascular;  Laterality: N/A;  . TONSILLECTOMY      Family History  Problem Relation Age of Onset  . Diabetes Mother   . Hyperlipidemia Mother   . Hypertension Mother   . Breast cancer Mother   . Rheum arthritis Father   . Asthma Father   . Stroke Father   . Thyroid disease Father   . Thyroid disease Brother   . Colon cancer Brother        in his 64s  . Breast cancer Sister     Allergies  Allergen Reactions  . Prednisone Hives and Other (See Comments)    Made her "crazy", suicidal  . Benicar [Olmesartan] Other (See Comments)    Did not work at all for patient  . Celexa [Citalopram] Other (See Comments)    Made patient feel crazy, fluoxetine is fine  . Effexor [Venlafaxine] Other (See Comments)    Made patient feel crazy  . Shrimp [Shellfish Allergy] Hives and Swelling    Outpatient Medications Prior to Visit  Medication Sig Dispense Refill  . albuterol (VENTOLIN HFA) 108 (90 Base) MCG/ACT inhaler Inhale 1-2 puffs into the lungs every 6 (six) hours as needed for wheezing or shortness of breath.  6.7 g 0  . cloNIDine (CATAPRES) 0.1 MG tablet Take 1 tablet (0.1 mg total) by mouth 2 (two) times daily. 60 tablet 6  . clopidogrel (PLAVIX) 75 MG tablet Take 1 tablet (75 mg total) by mouth daily. 30 tablet 6  . diltiazem (CARDIZEM CD) 120 MG 24 hr capsule Take 1 capsule (120 mg total) by mouth daily. 30 capsule 6  . escitalopram (LEXAPRO) 10 MG tablet Take 1 tablet (10 mg total) by mouth daily. 30 tablet 6  . gabapentin (NEURONTIN) 300 MG capsule Take 1 capsule (300 mg total) by mouth 3 (three) times daily. 90 capsule 6  . glipiZIDE (GLUCOTROL) 5 MG tablet Take 1 tablet (5 mg total)  by mouth 2 (two) times daily before a meal. 60 tablet 6  . hydrALAZINE (APRESOLINE) 50 MG tablet Take 1 tablet (50 mg total) by mouth 2 (two) times daily. 60 tablet 6  . hydrochlorothiazide (MICROZIDE) 12.5 MG capsule Take 1 capsule (12.5 mg total) by mouth daily. 30 capsule 6  . hydrOXYzine (ATARAX/VISTARIL) 25 MG tablet Take 1 tablet (25 mg total) by mouth 3 (three) times daily as needed for anxiety. 30 tablet 0  . insulin aspart (NOVOLOG) 100 UNIT/ML injection USE PER SLIDING SCALE. MAX OF 9 UNITS 3 TIMES DAILY 10 mL 0  . LANTUS 100 UNIT/ML injection INJECT 13 UNITS NIGHTLY AT BEDTIME 10 mL 0  . nicotine (NICODERM CQ) 7 mg/24hr patch Place 1 patch (7 mg total) onto the skin daily. 28 patch 0  . potassium chloride SA (KLOR-CON M20) 20 MEQ tablet Take 1 tablet (20 mEq total) by mouth daily. 30 tablet 0  . rosuvastatin (CRESTOR) 40 MG tablet Take 1 tablet (40 mg total) by mouth daily at 6 PM. 30 tablet 6  . traZODone (DESYREL) 50 MG tablet Take 1 tablet (50 mg total) by mouth at bedtime as needed for sleep. 15 tablet 0   No facility-administered medications prior to visit.     ROS Review of Systems  Constitutional: Negative for activity change, appetite change and fatigue.  HENT: Positive for dental problem. Negative for congestion, sinus pressure and sore throat.   Eyes: Negative for visual disturbance.  Respiratory: Negative for cough, chest tightness, shortness of breath and wheezing.   Cardiovascular: Negative for chest pain and palpitations.  Gastrointestinal: Negative for abdominal distention, abdominal pain and constipation.  Endocrine: Negative for polydipsia.  Genitourinary: Negative for dysuria and frequency.  Musculoskeletal: Negative for arthralgias and back pain.  Skin: Negative for rash.  Neurological: Positive for numbness. Negative for tremors and light-headedness.  Hematological: Does not bruise/bleed easily.  Psychiatric/Behavioral: Negative for agitation and behavioral  problems.    Objective:  BP (!) 169/70   Pulse 72   Ht 5\' 5"  (1.651 m)   Wt 168 lb 3.2 oz (76.3 kg)   SpO2 99%   BMI 27.99 kg/m   BP/Weight 02/13/2020 10/30/2019 [id number]  Systolic BP [id number] [id number] Q000111Q  Diastolic BP 70 74 64  Wt. (Lbs) 168.2 163 158  BMI 27.99 27.12 26.29  Some encounter information is confidential and restricted. Go to Review Flowsheets activity to see all data.      Physical Exam Constitutional:      Appearance: Lisa Anderson is well-developed.  HENT:     Right Ear: Tympanic membrane normal.  Neck:     Vascular: No JVD.  Cardiovascular:     Rate and Rhythm: Normal rate.     Heart sounds: Normal heart sounds. No murmur.  Pulmonary:  Effort: Pulmonary effort is normal.     Breath sounds: Normal breath sounds. No wheezing or rales.  Chest:     Chest wall: No tenderness.  Abdominal:     General: Bowel sounds are normal. There is distension (slight).     Palpations: Abdomen is soft. There is no mass.     Tenderness: There is no abdominal tenderness.  Musculoskeletal:        General: Normal range of motion.     Right lower leg: No edema.     Left lower leg: No edema.  Neurological:     Mental Status: Lisa Anderson is alert and oriented to person, place, and time.  Psychiatric:        Mood and Affect: Mood normal.     CMP Latest Ref Rng & Units 10/30/2019 09/25/2019 09/25/2019  Glucose 65 - 99 mg/dL 230(H) 276(H) 232(H)  BUN 8 - 27 mg/dL 14 25(H) 23  Creatinine 0.57 - 1.00 mg/dL 1.28(H) 1.58(H) 1.36(H)  Sodium 134 - 144 mmol/L 141 136 139  Potassium 3.5 - 5.2 mmol/L 3.7 4.4 3.7  Chloride 96 - 106 mmol/L 100 99 101  CO2 20 - 29 mmol/L 27 29 30   Calcium 8.7 - 10.3 mg/dL 9.3 9.0 9.0  Total Protein 6.5 - 8.1 g/dL - - 6.8  Total Bilirubin 0.3 - 1.2 mg/dL - - 0.2(L)  Alkaline Phos 38 - 126 U/L - - 95  AST 15 - 41 U/L - - 11(L)  ALT 0 - 44 U/L - - 11    Lipid Panel     Component Value Date/Time   CHOL 211 (H) 09/22/2019 1753   CHOL 209 (H) 01/16/2019 1052   TRIG  185 (H) 09/22/2019 1753   HDL 41 09/22/2019 1753   HDL 48 01/16/2019 1052   CHOLHDL 5.1 09/22/2019 1753   VLDL 37 09/22/2019 1753   LDLCALC 133 (H) 09/22/2019 1753   LDLCALC 139 (H) 01/16/2019 1052    CBC    Component Value Date/Time   WBC 7.7 09/25/2019 1532   RBC 4.42 09/25/2019 1532   HGB 12.0 09/25/2019 1532   HGB 12.1 01/16/2019 1052   HCT 39.1 09/25/2019 1532   HCT 37.4 01/16/2019 1052   PLT 261 09/25/2019 1532   PLT 286 01/16/2019 1052   MCV 88.5 09/25/2019 1532   MCV 84 01/16/2019 1052   MCH 27.1 09/25/2019 1532   MCHC 30.7 09/25/2019 1532   RDW 13.4 09/25/2019 1532   RDW 13.4 01/16/2019 1052   LYMPHSABS 2.1 01/16/2019 1052   MONOABS 0.6 09/18/2018 1612   EOSABS 1.0 (H) 01/16/2019 1052   BASOSABS 0.1 01/16/2019 1052    Lab Results  Component Value Date   HGBA1C 9.4 (A) 02/13/2020     Assessment & Plan:   1. Type 2 diabetes mellitus with 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 gangrene, with long-term current use of insulin (HCC) Uncontrolled with A1c of 9.4 Increase glipizide from 5 mg twice daily to 10 mg twice daily Counseled on Diabetic diet, my plate method, [id number] minutes of moderate intensity exercise/week Blood sugar logs with fasting goals of 80-120 mg/dl, random of less than 180 and in the event of sugars less than 60 mg/dl or greater than 400 mg/dl encouraged to notify the clinic. Advised on the need for annual eye exams, annual foot exams, Pneumonia vaccine. - POCT glucose (manual entry) - POCT glycosylated hemoglobin (Hb A1C) - glipiZIDE (GLUCOTROL) 10 MG tablet; Take 1 tablet (10 mg total) by mouth 2 (two) times  daily before a meal.  Dispense: 30 tablet; Refill: 6 - Ambulatory referral to Ophthalmology  2. Chronic diastolic CHF (congestive heart failure) (HCC) Stable with EF of 60 to 65% from 11/2017 Euvolemic - potassium chloride SA (KLOR-CON M20) 20 MEQ tablet; Take 1 tablet (20 mEq total) by mouth daily.  Dispense: 30 tablet; Refill: 6  3.  Recurrent major depressive disorder, remission status unspecified ([place]) Uncontrolled Lisa Anderson was previously on 10 mg of Lexapro which has been increased to 20 mg Follows up with mental health-[facility] will place an [facility] schedule for therapy - escitalopram (LEXAPRO) 20 MG tablet; Take 1 tablet (20 mg total) by mouth daily.  Dispense: 30 tablet; Refill: 6  4. Abdominal bloating - H. pylori breath test  5. History of stroke Continue Plavix Risk factor modification  6. Hypertensive heart disease with chronic diastolic congestive heart failure (HCC) Uncontrolled hypertension Increase hydralazine dose - hydrALAZINE (APRESOLINE) 100 MG tablet; Take 1 tablet (100 mg total) by mouth 2 (two) times daily.  Dispense: 60 tablet; Refill: 6  7. Pain, dental - amoxicillin (AMOXIL) 500 MG capsule; Take 1 capsule (500 mg total) by mouth 3 (three) times daily.  Dispense: 30 capsule; Refill: 0    Charlott Rakes, MD, FAAFP. [facility] and [place], [place]   02/13/2020, 9:37 AM

## 2020-02-14 LAB — H. PYLORI BREATH TEST: H pylori Breath Test: POSITIVE — AB

## 2020-02-15 ENCOUNTER — Other Ambulatory Visit: Payer: Self-pay | Admitting: Family Medicine

## 2020-02-15 DIAGNOSIS — R140 Abdominal distension (gaseous): Secondary | ICD-10-CM

## 2020-02-15 MED ORDER — CLARITHROMYCIN 500 MG PO TABS: 500.0000 mg | ORAL_TABLET | Freq: Two times a day (BID) | ORAL | 0 refills | Status: DC

## 2020-02-15 MED ORDER — AMOXICILLIN 500 MG PO CAPS: 1000.0000 mg | ORAL_CAPSULE | Freq: Two times a day (BID) | ORAL | 0 refills | Status: DC

## 2020-02-15 MED ORDER — OMEPRAZOLE 20 MG PO CPDR: 20.0000 mg | DELAYED_RELEASE_CAPSULE | Freq: Two times a day (BID) | ORAL | 0 refills | Status: DC

## 2020-02-17 ENCOUNTER — Telehealth: Payer: Self-pay

## 2020-02-17 NOTE — Telephone Encounter (Signed)
Patient was called and a voicemail was left informing patient to return phone call for lab results. 

## 2020-02-17 NOTE — Telephone Encounter (Signed)
-----   Message from Charlott Rakes, MD sent at 02/15/2020  6:47 AM EDT ----- She tested positive for H.pylori. I have sent a combination of 3 medications to her pharmacy for a course of 2 weeks. Repeat H.pylori test recommended in 1 month. She was previously prescribed amoxicillin for tooth ache; please advise to commence this new dose which she will be receiving as it is different from the dose for dental problems.

## 2020-02-18 NOTE — Telephone Encounter (Signed)
Patient was called and informed of lab results and lab appointment was set for 1 month.

## 2020-02-26 ENCOUNTER — Ambulatory Visit: Payer: Medicaid Other | Admitting: Family Medicine

## 2020-03-16 ENCOUNTER — Other Ambulatory Visit: Payer: No Typology Code available for payment source

## 2020-03-24 ENCOUNTER — Encounter: Payer: No Typology Code available for payment source | Admitting: Family Medicine

## 2020-05-12 ENCOUNTER — Encounter: Payer: Self-pay | Admitting: Family Medicine

## 2020-05-12 ENCOUNTER — Other Ambulatory Visit: Payer: Self-pay

## 2020-05-12 ENCOUNTER — Ambulatory Visit: Payer: No Typology Code available for payment source | Attending: Family Medicine | Admitting: Family Medicine

## 2020-05-12 ENCOUNTER — Other Ambulatory Visit: Payer: Self-pay | Admitting: Family Medicine

## 2020-05-12 ENCOUNTER — Other Ambulatory Visit (HOSPITAL_COMMUNITY)
Admission: RE | Admit: 2020-05-12 | Discharge: 2020-05-12 | Disposition: A | Discharge status: Home / Self Care | Payer: PRIVATE HEALTH INSURANCE | Source: Ambulatory Visit | Attending: Family Medicine | Admitting: Family Medicine

## 2020-05-12 VITALS — BP 159/69 | HR 82 | Ht 65.0 in | Wt 166.0 lb

## 2020-05-12 DIAGNOSIS — R140 Abdominal distension (gaseous): Secondary | ICD-10-CM | POA: Diagnosis not present

## 2020-05-12 DIAGNOSIS — Z113 Encounter for screening for infections with a predominantly sexual mode of transmission: Secondary | ICD-10-CM | POA: Diagnosis present

## 2020-05-12 DIAGNOSIS — Z124 Encounter for screening for malignant neoplasm of cervix: Secondary | ICD-10-CM | POA: Diagnosis not present

## 2020-05-12 DIAGNOSIS — Z1211 Encounter for screening for malignant neoplasm of colon: Secondary | ICD-10-CM

## 2020-05-12 DIAGNOSIS — Z0000 Encounter for general adult medical examination without abnormal findings: Secondary | ICD-10-CM

## 2020-05-12 DIAGNOSIS — E1151 Type 2 diabetes mellitus with 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 gangrene: Secondary | ICD-10-CM

## 2020-05-12 MED ORDER — OMEPRAZOLE 20 MG PO CPDR: 20.0000 mg | DELAYED_RELEASE_CAPSULE | Freq: Two times a day (BID) | ORAL | 0 refills | Status: DC

## 2020-05-12 NOTE — Progress Notes (Signed)
Having abdominal pain states that she has a bitter tate in her mouth, she thinks it may be her gallbladder.  She has the most trouble after she eats.

## 2020-05-12 NOTE — Patient Instructions (Signed)
Health Maintenance, Female Adopting a healthy lifestyle and getting preventive care are important in promoting health and wellness. Ask your health care provider about:  The right schedule for you to have regular tests and exams.  Things you can do on your own to prevent diseases and keep yourself healthy. What should I know about diet, weight, and exercise? Eat a healthy diet   Eat a diet that includes plenty of vegetables, fruits, low-fat dairy products, and lean protein.  Do not eat a lot of foods that are high in solid fats, added sugars, or sodium. Maintain a healthy weight Body mass index (BMI) is used to identify weight problems. It estimates body fat based on height and weight. Your health care provider can help determine your BMI and help you achieve or maintain a healthy weight. Get regular exercise Get regular exercise. This is one of the most important things you can do for your health. Most adults should:  Exercise for at least 150 minutes each week. The exercise should increase your heart rate and make you sweat (moderate-intensity exercise).  Do strengthening exercises at least twice a week. This is in addition to the moderate-intensity exercise.  Spend less time sitting. Even light physical activity can be beneficial. Watch cholesterol and blood lipids Have your blood tested for lipids and cholesterol at 62 years of age, then have this test every 5 years. Have your cholesterol levels checked more often if:  Your lipid or cholesterol levels are high.  You are older than 62 years of age.  You are at high risk for heart disease. What should I know about cancer screening? Depending on your health history and family history, you may need to have cancer screening at various ages. This may include screening for:  Breast cancer.  Cervical cancer.  Colorectal cancer.  Skin cancer.  Lung cancer. What should I know about heart disease, diabetes, and high blood  pressure? Blood pressure and heart disease  High blood pressure causes heart disease and increases the risk of stroke. This is more likely to develop in people who have high blood pressure readings, are of African descent, or are overweight.  Have your blood pressure checked: ? Every 3-5 years if you are 18-39 years of age. ? Every year if you are 40 years old or older. Diabetes Have regular diabetes screenings. This checks your fasting blood sugar level. Have the screening done:  Once every three years after age 40 if you are at a normal weight and have a low risk for diabetes.  More often and at a younger age if you are overweight or have a high risk for diabetes. What should I know about preventing infection? Hepatitis B If you have a higher risk for hepatitis B, you should be screened for this virus. Talk with your health care provider to find out if you are at risk for hepatitis B infection. Hepatitis C Testing is recommended for:  Everyone born from 1945 through 1965.  Anyone with known risk factors for hepatitis C. Sexually transmitted infections (STIs)  Get screened for STIs, including gonorrhea and chlamydia, if: ? You are sexually active and are younger than 62 years of age. ? You are older than 62 years of age and your health care provider tells you that you are at risk for this type of infection. ? Your sexual activity has changed since you were last screened, and you are at increased risk for chlamydia or gonorrhea. Ask your health care provider if   you are at risk.  Ask your health care provider about whether you are at high risk for HIV. Your health care provider may recommend a prescription medicine to help prevent HIV infection. If you choose to take medicine to prevent HIV, you should first get tested for HIV. You should then be tested every 3 months for as long as you are taking the medicine. Pregnancy  If you are about to stop having your period (premenopausal) and  you may become pregnant, seek counseling before you get pregnant.  Take 400 to 800 micrograms (mcg) of folic acid every day if you become pregnant.  Ask for birth control (contraception) if you want to prevent pregnancy. Osteoporosis and menopause Osteoporosis is a disease in which the bones lose minerals and strength with aging. This can result in bone fractures. If you are 65 years old or older, or if you are at risk for osteoporosis and fractures, ask your health care provider if you should:  Be screened for bone loss.  Take a calcium or vitamin D supplement to lower your risk of fractures.  Be given hormone replacement therapy (HRT) to treat symptoms of menopause. Follow these instructions at home: Lifestyle  Do not use any products that contain nicotine or tobacco, such as cigarettes, e-cigarettes, and chewing tobacco. If you need help quitting, ask your health care provider.  Do not use street drugs.  Do not share needles.  Ask your health care provider for help if you need support or information about quitting drugs. Alcohol use  Do not drink alcohol if: ? Your health care provider tells you not to drink. ? You are pregnant, may be pregnant, or are planning to become pregnant.  If you drink alcohol: ? Limit how much you use to 0-1 drink a day. ? Limit intake if you are breastfeeding.  Be aware of how much alcohol is in your drink. In the [place] one drink equals one 12 oz bottle of beer (355 mL), one 5 oz glass of wine (148 mL), or one 1 oz glass of hard liquor (44 mL). General instructions  Schedule regular health, dental, and eye exams.  Stay current with your vaccines.  Tell your health care provider if: ? You often feel depressed. ? You have ever been abused or do not feel safe at home. Summary  Adopting a healthy lifestyle and getting preventive care are important in promoting health and wellness.  Follow your health care provider's instructions about healthy  diet, exercising, and getting tested or screened for diseases.  Follow your health care provider's instructions on monitoring your cholesterol and blood pressure. This information is not intended to replace advice given to you by your health care provider. Make sure you discuss any questions you have with your health care provider. Document Revised: 09/05/2018 Document Reviewed: 09/05/2018 Elsevier Patient Education  2020 Elsevier Inc.  

## 2020-05-12 NOTE — Telephone Encounter (Signed)
Notes to clinic:  Patient has appointment today    Requested Prescriptions  Pending Prescriptions Disp Refills   LANTUS 100 UNIT/ML injection [Pharmacy Med Name: LANTUS 100 UNITS/ML VIAL 100 Solution] 10 mL 0    Sig: INJECT 13 UNITS INTO THE SKIN NIGHTLY AT BEDTIME      Endocrinology:  Diabetes - Insulins Failed - 05/12/2020  9:56 AM      Failed - HBA1C is between 0 and 7.9 and within 180 days    HbA1c, POC (controlled diabetic range)  Date Value Ref Range Status  02/13/2020 9.4 (A) 0.0 - 7.0 % Final          Passed - Valid encounter within last 6 months    Recent Outpatient Visits           2 months ago Type 2 diabetes mellitus with 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 gangrene, with long-term current use of insulin ([place])   [place], [place], MD   6 months ago Type 2 diabetes mellitus with 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 gangrene, with long-term current use of insulin ([place])   [place], Charlane Ferretti, MD   1 year ago    [place]   1 year ago Type 2 diabetes mellitus with 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 gangrene, with long-term current use of insulin [facility])   [place], [place], [place]   2 years ago Screening for breast cancer   [place], Charlane Ferretti, MD       Future Appointments             Today Charlott Rakes, MD [place] 100 UNIT/ML injection [Pharmacy Med Name: novoLOG 100 UNITS/ML VIAL 100 Solution] 10 mL 0    Sig: USE PER SLIDING SCALE. 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

## 2020-05-12 NOTE — Progress Notes (Signed)
Subjective:  Patient ID: Lisa Anderson, female    DOB: [birth year]  Age: 64 y.o. 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 She is up-to-date on her mammogram which she had done in 12/2019 which is negative for malignancy Due for Pap smear and colonoscopy.  Previously referred for colonoscopy on different occasions but she never followed through. She complains of abdominal bloating, nausea and intermittent pain, constipation.  Treated for H. pylori gastritis with Biaxin, amoxicillin and omeprazole 3 months ago but today states symptoms are different from 3 months ago.   Past Medical History:  Diagnosis Date  . Asthma   . Cancer ([place])    breast  . Chronic congestive heart failure with left ventricular diastolic dysfunction ([place])   . Diabetes mellitus   . Hyperlipidemia   . Hypertension   . Stroke [facility])     Past Surgical History:  Procedure Laterality Date  . BREAST BIOPSY Right 2007  . BREAST LUMPECTOMY Right 2007  . EP IMPLANTABLE DEVICE N/A 08/07/2015   Procedure: Loop Recorder Insertion;  Surgeon: Thompson Grayer, MD;  Location: [place] CV LAB;  Service: Cardiovascular;  Laterality: N/A;  . TEE WITHOUT CARDIOVERSION N/A 08/07/2015   Procedure: TRANSESOPHAGEAL ECHOCARDIOGRAM (TEE);  Surgeon: Sueanne Margarita, MD;  Location: [facility] ENDOSCOPY;  Service: Cardiovascular;  Laterality: N/A;  . TONSILLECTOMY      Family History  Problem Relation Age of Onset  . Diabetes Mother   . Hyperlipidemia Mother   . Hypertension Mother   . Breast cancer Mother   . Rheum arthritis Father   . Asthma Father   . Stroke Father   . Thyroid disease  Father   . Thyroid disease Brother   . Colon cancer Brother        in his 34s  . Breast cancer Sister     Allergies  Allergen Reactions  . Prednisone Hives and Other (See Comments)    Made her "crazy", suicidal  . Benicar [Olmesartan] Other (See Comments)    Did not work at all for patient  . Celexa [Citalopram] Other (See Comments)    Made patient feel crazy, fluoxetine is fine  . Effexor [Venlafaxine] Other (See Comments)    Made patient feel crazy  . Shrimp [Shellfish Allergy] Hives and Swelling    Outpatient Medications Prior to Visit  Medication Sig Dispense Refill  . albuterol (VENTOLIN HFA) 108 (90 Base) MCG/ACT inhaler Inhale 1-2 puffs into the lungs every 6 (six) hours as needed for wheezing or shortness of breath. 6.7 g 0  . cloNIDine (CATAPRES) 0.1 MG tablet Take 1 tablet (0.1 mg total) by mouth 2 (two) times daily. 60 tablet 6  . clopidogrel (PLAVIX) 75 MG tablet Take 1 tablet (75 mg total) by mouth daily. 30 tablet 6  . diltiazem (CARDIZEM CD) 120 MG 24 hr capsule Take 1 capsule (120 mg total) by mouth daily. 30 capsule 6  . escitalopram (LEXAPRO) 20 MG tablet Take 1 tablet (20 mg total) by mouth daily. 30 tablet 6  . gabapentin (NEURONTIN) 300 MG capsule Take 1 capsule (300 mg total) by mouth 3 (three) times daily. 90 capsule 6  . glipiZIDE (GLUCOTROL) 10  MG tablet Take 1 tablet (10 mg total) by mouth 2 (two) times daily before a meal. 30 tablet 6  . hydrALAZINE (APRESOLINE) 100 MG tablet Take 1 tablet (100 mg total) by mouth 2 (two) times daily. 60 tablet 6  . hydrochlorothiazide (MICROZIDE) 12.5 MG capsule Take 1 capsule (12.5 mg total) by mouth daily. 30 capsule 6  . insulin aspart (NOVOLOG) 100 UNIT/ML injection USE PER SLIDING SCALE. MAX OF 9 UNITS 3 TIMES DAILY 10 mL 0  . LANTUS 100 UNIT/ML injection INJECT 13 UNITS NIGHTLY AT BEDTIME 10 mL 0  . potassium chloride SA (KLOR-CON M20) 20 MEQ tablet Take 1 tablet (20 mEq total) by mouth daily. 30 tablet 6  .  rosuvastatin (CRESTOR) 40 MG tablet Take 1 tablet (40 mg total) by mouth daily at 6 PM. 30 tablet 6  . traZODone (DESYREL) 50 MG tablet Take 1 tablet (50 mg total) by mouth at bedtime as needed for sleep. 15 tablet 0  . amoxicillin (AMOXIL) 500 MG capsule Take 2 capsules (1,000 mg total) by mouth 2 (two) times daily. (Patient not taking: Reported on 05/12/2020) 56 capsule 0  . clarithromycin (BIAXIN) 500 MG tablet Take 1 tablet (500 mg total) by mouth 2 (two) times daily. Hold diltiazem while on Biaxin (Patient not taking: Reported on 05/12/2020) 28 tablet 0  . hydrOXYzine (ATARAX/VISTARIL) 25 MG tablet Take 1 tablet (25 mg total) by mouth 3 (three) times daily as needed for anxiety. (Patient not taking: Reported on 05/12/2020) 30 tablet 0  . nicotine (NICODERM CQ) 7 mg/24hr patch Place 1 patch (7 mg total) onto the skin daily. (Patient not taking: Reported on 05/12/2020) 28 patch 0  . omeprazole (PRILOSEC) 20 MG capsule Take 1 capsule (20 mg total) by mouth 2 (two) times daily before a meal. (Patient not taking: Reported on 05/12/2020) 28 capsule 0   No facility-administered medications prior to visit.     ROS Review of Systems  Constitutional: Negative for activity change, appetite change and fatigue.  HENT: Negative for congestion, sinus pressure and sore throat.   Eyes: Negative for visual disturbance.  Respiratory: Negative for cough, chest tightness, shortness of breath and wheezing.   Cardiovascular: Negative for chest pain and palpitations.  Gastrointestinal: Positive for abdominal pain, constipation and nausea. Negative for abdominal distention.  Endocrine: Negative for polydipsia.  Genitourinary: Negative for dysuria and frequency.  Musculoskeletal: Negative for arthralgias and back pain.  Skin: Negative for rash.  Neurological: Negative for tremors, light-headedness and numbness.  Hematological: Does not bruise/bleed easily.  Psychiatric/Behavioral: Negative for agitation and  behavioral problems.    Objective:  BP (!) 159/69   Pulse 82   Ht 5\' 5"  (1.651 m)   Wt 166 lb (75.3 kg)   SpO2 99%   BMI 27.62 kg/m   BP/Weight 05/12/2020 02/28/5408 04/26/1913  Systolic BP 782 956 213  Diastolic BP 69 70 74  Wt. (Lbs) 166 168.2 163  BMI 27.62 27.99 27.12  Some encounter information is confidential and restricted. Go to Review Flowsheets activity to see all data.      Physical Exam Constitutional:      General: She is not in acute distress.    Appearance: She is well-developed. She is not diaphoretic.  HENT:     Head: Normocephalic.     Right Ear: External ear normal.     Left Ear: External ear normal.     Nose: Nose normal.  Eyes:     Conjunctiva/sclera: Conjunctivae normal.  Pupils: Pupils are equal, round, and reactive to light.  Neck:     Vascular: No JVD.  Cardiovascular:     Rate and Rhythm: Normal rate and regular rhythm.     Heart sounds: Normal heart sounds. No murmur heard.  No gallop.   Pulmonary:     Effort: Pulmonary effort is normal. No respiratory distress.     Breath sounds: Normal breath sounds. No wheezing or rales.  Chest:     Chest wall: No tenderness.  Abdominal:     General: Bowel sounds are normal. There is no distension.     Palpations: Abdomen is soft. There is no mass.     Tenderness: There is no abdominal tenderness.  Genitourinary:    Comments: External genitalia-normal Vagina-yellowish discharge Cervix, adnexa - normal Musculoskeletal:        General: No tenderness. Normal range of motion.     Cervical back: Normal range of motion.  Skin:    General: Skin is warm and dry.  Neurological:     Mental Status: She is alert and oriented to person, place, and time.     Deep Tendon Reflexes: Reflexes are normal and symmetric.     CMP Latest Ref Rng & Units 10/30/2019 09/25/2019 09/25/2019  Glucose 65 - 99 mg/dL 230(H) 276(H) 232(H)  BUN 8 - 27 mg/dL 14 25(H) 23  Creatinine 0.57 - 1.00 mg/dL 1.28(H) 1.58(H) 1.36(H)    Sodium 134 - 144 mmol/L 141 136 139  Potassium 3.5 - 5.2 mmol/L 3.7 4.4 3.7  Chloride 96 - 106 mmol/L 100 99 101  CO2 20 - 29 mmol/L 27 29 30   Calcium 8.7 - 10.3 mg/dL 9.3 9.0 9.0  Total Protein 6.5 - 8.1 g/dL - - 6.8  Total Bilirubin 0.3 - 1.2 mg/dL - - 0.2(L)  Alkaline Phos 38 - 126 U/L - - 95  AST 15 - 41 U/L - - 11(L)  ALT 0 - 44 U/L - - 11    Lipid Panel     Component Value Date/Time   CHOL 211 (H) 09/22/2019 1753   CHOL 209 (H) 01/16/2019 1052   TRIG 185 (H) 09/22/2019 1753   HDL 41 09/22/2019 1753   HDL 48 01/16/2019 1052   CHOLHDL 5.1 09/22/2019 1753   VLDL 37 09/22/2019 1753   LDLCALC 133 (H) 09/22/2019 1753   LDLCALC 139 (H) 01/16/2019 1052    CBC    Component Value Date/Time   WBC 7.7 09/25/2019 1532   RBC 4.42 09/25/2019 1532   HGB 12.0 09/25/2019 1532   HGB 12.1 01/16/2019 1052   HCT 39.1 09/25/2019 1532   HCT 37.4 01/16/2019 1052   PLT 261 09/25/2019 1532   PLT 286 01/16/2019 1052   MCV 88.5 09/25/2019 1532   MCV 84 01/16/2019 1052   MCH 27.1 09/25/2019 1532   MCHC 30.7 09/25/2019 1532   RDW 13.4 09/25/2019 1532   RDW 13.4 01/16/2019 1052   LYMPHSABS 2.1 01/16/2019 1052   MONOABS 0.6 09/18/2018 1612   EOSABS 1.0 (H) 01/16/2019 1052   BASOSABS 0.1 01/16/2019 1052    Lab Results  Component Value Date   HGBA1C 9.4 (A) 02/13/2020    Assessment & Plan:  1. Colon cancer screening - Cologuard  2. Annual physical exam Counseled on 150 minutes of exercise per week, healthy eating (including decreased daily intake of saturated fats, cholesterol, added sugars, sodium), routine healthcare maintenance.   3. Screening for cervical cancer - Cytology - PAP([place])  4. Abdominal bloating  Completed treatment with triple antibiotic regimen 3 months ago Omeprazole refilled, advised to obtain simethicone OTC - Ambulatory referral to Gastroenterology  5. Screening for STD (sexually transmitted disease) - Cervicovaginal ancillary only  Health  Care Maintenance: Due for flu shot which is not available in the clinic at this time; will administer at next office visit Meds ordered this encounter  Medications  . omeprazole (PRILOSEC) 20 MG capsule    Sig: Take 1 capsule (20 mg total) by mouth 2 (two) times daily before a meal.    Dispense:  30 capsule    Refill:  0    Follow-up: Return in about 2 weeks (around 05/26/2020) for Chronic disease management.       Charlott Rakes, MD, FAAFP. [facility] and [place], [place]   05/12/2020, 3:04 PM

## 2020-05-13 ENCOUNTER — Other Ambulatory Visit: Payer: Self-pay | Admitting: Family Medicine

## 2020-05-13 LAB — CERVICOVAGINAL ANCILLARY ONLY
Bacterial Vaginitis (gardnerella): NEGATIVE
Candida Glabrata: NEGATIVE
Candida Vaginitis: NEGATIVE
Chlamydia: NEGATIVE
Comment: NEGATIVE
Comment: NEGATIVE
Comment: NEGATIVE
Comment: NEGATIVE
Comment: NEGATIVE
Comment: NORMAL
Neisseria Gonorrhea: NEGATIVE
Trichomonas: NEGATIVE

## 2020-05-14 MED FILL — LANTUS 100 UNITS/ML VIAL: 100 | 28 days supply | Qty: 10 | Fill #0

## 2020-05-14 MED FILL — $novoLOG 100 UNITS/ML VIAL: 100 | 28 days supply | Qty: 10 | Fill #0

## 2020-05-18 LAB — CYTOLOGY - PAP
Comment: NEGATIVE
Comment: NEGATIVE
HPV 16: NEGATIVE
HPV 18 / 45: NEGATIVE
High risk HPV: POSITIVE — AB

## 2020-05-19 ENCOUNTER — Other Ambulatory Visit (INDEPENDENT_AMBULATORY_CARE_PROVIDER_SITE_OTHER): Payer: Self-pay | Admitting: Family Medicine

## 2020-05-19 DIAGNOSIS — R87618 Other abnormal cytological findings on specimens from cervix uteri: Secondary | ICD-10-CM

## 2020-05-22 ENCOUNTER — Telehealth: Payer: Self-pay

## 2020-05-22 NOTE — Telephone Encounter (Signed)
Patient name and DOB has been verified Patient was informed of lab results. Patient had no questions.  

## 2020-05-22 NOTE — Telephone Encounter (Signed)
-----   Message from Charlott Rakes, MD sent at 05/19/2020 10:54 AM EDT ----- PAP smear is slightly abnormal, I have referred to GYN for further evaluation

## 2020-05-27 ENCOUNTER — Encounter: Payer: Self-pay | Admitting: Family Medicine

## 2020-05-27 ENCOUNTER — Other Ambulatory Visit: Payer: Self-pay

## 2020-05-27 ENCOUNTER — Ambulatory Visit: Payer: No Typology Code available for payment source | Attending: Family Medicine | Admitting: Family Medicine

## 2020-05-27 VITALS — BP 162/72 | HR 76 | Ht 65.0 in | Wt 163.0 lb

## 2020-05-27 DIAGNOSIS — I110 Hypertensive heart disease with heart failure: Secondary | ICD-10-CM

## 2020-05-27 DIAGNOSIS — Z794 Long term (current) use of insulin: Secondary | ICD-10-CM | POA: Diagnosis not present

## 2020-05-27 DIAGNOSIS — Z23 Encounter for immunization: Secondary | ICD-10-CM | POA: Diagnosis not present

## 2020-05-27 DIAGNOSIS — I5032 Chronic diastolic (congestive) heart failure: Secondary | ICD-10-CM

## 2020-05-27 DIAGNOSIS — Z8673 Personal history of transient ischemic attack (TIA), and cerebral infarction without residual deficits: Secondary | ICD-10-CM | POA: Diagnosis not present

## 2020-05-27 DIAGNOSIS — E1121 Type 2 diabetes mellitus with diabetic nephropathy: Secondary | ICD-10-CM

## 2020-05-27 DIAGNOSIS — E1151 Type 2 diabetes mellitus with 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 gangrene: Secondary | ICD-10-CM | POA: Diagnosis not present

## 2020-05-27 DIAGNOSIS — R140 Abdominal distension (gaseous): Secondary | ICD-10-CM

## 2020-05-27 DIAGNOSIS — F339 Major depressive disorder, recurrent, unspecified: Secondary | ICD-10-CM

## 2020-05-27 DIAGNOSIS — Z720 Tobacco use: Secondary | ICD-10-CM

## 2020-05-27 LAB — POCT GLYCOSYLATED HEMOGLOBIN (HGB A1C): HbA1c, POC (controlled diabetic range): 10 % — AB (ref 0.0–7.0)

## 2020-05-27 LAB — GLUCOSE, POCT (MANUAL RESULT ENTRY): POC Glucose: 284 mg/dl — AB (ref 70–99)

## 2020-05-27 MED ORDER — CLONIDINE HCL 0.1 MG PO TABS: 0.1000 mg | ORAL_TABLET | Freq: Two times a day (BID) | ORAL | 6 refills | Status: DC

## 2020-05-27 MED ORDER — GLIPIZIDE 10 MG PO TABS: 10.0000 mg | ORAL_TABLET | Freq: Two times a day (BID) | ORAL | 6 refills | Status: DC

## 2020-05-27 MED ORDER — NICOTINE 7 MG/24HR TD PT24: 7.0000 mg | MEDICATED_PATCH | Freq: Every day | TRANSDERMAL | 3 refills | Status: DC

## 2020-05-27 MED ORDER — CLOPIDOGREL BISULFATE 75 MG PO TABS: 75.0000 mg | ORAL_TABLET | Freq: Every day | ORAL | 6 refills | Status: DC

## 2020-05-27 MED ORDER — ROSUVASTATIN CALCIUM 40 MG PO TABS: 40.0000 mg | ORAL_TABLET | Freq: Every day | ORAL | 6 refills | Status: DC

## 2020-05-27 MED ORDER — GABAPENTIN 300 MG PO CAPS: 300.0000 mg | ORAL_CAPSULE | Freq: Three times a day (TID) | ORAL | 6 refills | Status: DC

## 2020-05-27 MED ORDER — HYDROCHLOROTHIAZIDE 12.5 MG PO CAPS: 12.5000 mg | ORAL_CAPSULE | Freq: Every day | ORAL | 6 refills | Status: DC

## 2020-05-27 MED ORDER — DILTIAZEM HCL ER COATED BEADS 120 MG PO CP24: 120.0000 mg | ORAL_CAPSULE | Freq: Every day | ORAL | 6 refills | Status: DC

## 2020-05-27 MED ORDER — HYDROCHLOROTHIAZIDE 25 MG PO TABS: 25.0000 mg | ORAL_TABLET | Freq: Every day | ORAL | 6 refills | Status: DC

## 2020-05-27 MED ORDER — HYDRALAZINE HCL 100 MG PO TABS: 100.0000 mg | ORAL_TABLET | Freq: Two times a day (BID) | ORAL | 6 refills | Status: DC

## 2020-05-27 MED ORDER — ESCITALOPRAM OXALATE 20 MG PO TABS: 20.0000 mg | ORAL_TABLET | Freq: Every day | ORAL | 6 refills | Status: DC

## 2020-05-27 MED ORDER — POTASSIUM CHLORIDE CRYS ER 20 MEQ PO TBCR: 20.0000 meq | EXTENDED_RELEASE_TABLET | Freq: Every day | ORAL | 6 refills | Status: DC

## 2020-05-27 MED ORDER — INSULIN GLARGINE 100 UNIT/ML ~~LOC~~ SOLN: 20.0000 [IU] | Freq: Every day | SUBCUTANEOUS | 6 refills | Status: DC

## 2020-05-27 NOTE — Progress Notes (Signed)
Subjective:  Patient ID: Lisa Anderson, female    DOB: [birth year]  Age: 64 y.o. 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  BP is elevated but she is yet to take her antihypertensives.  Denies presence of chest pain, dyspnea, pedal edema.  Still has abdominal bloating and reduced appetite and has lost 3 lbs as a result of poor oral intake.  I had referred her to [facility] with the hope that she would undergo an upper and lower endoscopy but was told she would not have an appointment until November. Treated for H. pylori gastritis in 01/2020.  Also has constipation and has to use prune juice every third day for relief.  She is yet to have a colonoscopy. She will write prescription for nicotine patches to aid in smoking cessation.  With regards to her diabetes her random blood sugars have ranged from 200-400 and she informs me when her sugars get down to 140 she gets tremors.  Endorses noncompliance with a diabetic diet and drinks a lot of sodas. Past Medical History:  Diagnosis Date   Asthma    Cancer ([place])    breast   Chronic congestive heart failure with left ventricular diastolic dysfunction ([place])    Diabetes mellitus    Hyperlipidemia    Hypertension    Stroke [facility])     Past Surgical History:  Procedure Laterality Date   BREAST BIOPSY Right 2007   BREAST LUMPECTOMY Right 2007   EP IMPLANTABLE DEVICE N/A 08/07/2015   Procedure: Loop Recorder Insertion;  Surgeon: Thompson Grayer, MD;  Location: [place] CV LAB;  Service: Cardiovascular;  Laterality: N/A;   TEE WITHOUT CARDIOVERSION N/A 08/07/2015   Procedure: TRANSESOPHAGEAL ECHOCARDIOGRAM (TEE);   Surgeon: Sueanne Margarita, MD;  Location: [facility] ENDOSCOPY;  Service: Cardiovascular;  Laterality: N/A;   TONSILLECTOMY      Family History  Problem Relation Age of Onset   Diabetes Mother    Hyperlipidemia Mother    Hypertension Mother    Breast cancer Mother    Rheum arthritis Father    Asthma Father    Stroke Father    Thyroid disease Father    Thyroid disease Brother    Colon cancer Brother        in his 31s   Breast cancer Sister     Allergies  Allergen Reactions   Prednisone Hives and Other (See Comments)    Made her "crazy", suicidal   Benicar [Olmesartan] Other (See Comments)    Did not work at all for patient   Celexa [Citalopram] Other (See Comments)    Made patient feel crazy, fluoxetine is fine   Effexor [Venlafaxine] Other (See Comments)    Made patient feel crazy   Shrimp [Shellfish Allergy] Hives and Swelling    Outpatient Medications Prior to Visit  Medication Sig Dispense Refill   albuterol (VENTOLIN HFA) 108 (90 Base) MCG/ACT inhaler Inhale 1-2 puffs into the lungs every 6 (six) hours as needed for wheezing or shortness of breath. 6.7 g 0   insulin aspart (NOVOLOG) 100 UNIT/ML injection USE PER SLIDING SCALE. MAX OF 9 UNITS 3 TIMES DAILY 10 mL 0   omeprazole (PRILOSEC) 20 MG capsule  Take 1 capsule (20 mg total) by mouth 2 (two) times daily before a meal. 30 capsule 0   traZODone (DESYREL) 50 MG tablet Take 1 tablet (50 mg total) by mouth at bedtime as needed for sleep. 15 tablet 0   cloNIDine (CATAPRES) 0.1 MG tablet Take 1 tablet (0.1 mg total) by mouth 2 (two) times daily. 60 tablet 6   clopidogrel (PLAVIX) 75 MG tablet Take 1 tablet (75 mg total) by mouth daily. 30 tablet 6   diltiazem (CARDIZEM CD) 120 MG 24 hr capsule Take 1 capsule (120 mg total) by mouth daily. 30 capsule 6   escitalopram (LEXAPRO) 20 MG tablet Take 1 tablet (20 mg total) by mouth daily. 30 tablet 6   gabapentin (NEURONTIN) 300 MG capsule Take 1 capsule (300 mg  total) by mouth 3 (three) times daily. 90 capsule 6   glipiZIDE (GLUCOTROL) 10 MG tablet Take 1 tablet (10 mg total) by mouth 2 (two) times daily before a meal. 30 tablet 6   hydrALAZINE (APRESOLINE) 100 MG tablet Take 1 tablet (100 mg total) by mouth 2 (two) times daily. 60 tablet 6   hydrochlorothiazide (MICROZIDE) 12.5 MG capsule Take 1 capsule (12.5 mg total) by mouth daily. 30 capsule 6   LANTUS 100 UNIT/ML injection INJECT 13 UNITS INTO THE SKIN NIGHTLY AT BEDTIME 10 mL 2   potassium chloride SA (KLOR-CON M20) 20 MEQ tablet Take 1 tablet (20 mEq total) by mouth daily. 30 tablet 6   rosuvastatin (CRESTOR) 40 MG tablet Take 1 tablet (40 mg total) by mouth daily at 6 PM. 30 tablet 6   amoxicillin (AMOXIL) 500 MG capsule Take 2 capsules (1,000 mg total) by mouth 2 (two) times daily. (Patient not taking: Reported on 05/12/2020) 56 capsule 0   clarithromycin (BIAXIN) 500 MG tablet Take 1 tablet (500 mg total) by mouth 2 (two) times daily. Hold diltiazem while on Biaxin (Patient not taking: Reported on 05/12/2020) 28 tablet 0   hydrOXYzine (ATARAX/VISTARIL) 25 MG tablet Take 1 tablet (25 mg total) by mouth 3 (three) times daily as needed for anxiety. (Patient not taking: Reported on 05/12/2020) 30 tablet 0   nicotine (NICODERM CQ) 7 mg/24hr patch Place 1 patch (7 mg total) onto the skin daily. (Patient not taking: Reported on 05/12/2020) 28 patch 0   No facility-administered medications prior to visit.     ROS Review of Systems  Constitutional: Positive for unexpected weight change. Negative for activity change, appetite change and fatigue.  HENT: Negative for congestion, sinus pressure and sore throat.   Eyes: Negative for visual disturbance.  Respiratory: Negative for cough, chest tightness, shortness of breath and wheezing.   Cardiovascular: Negative for chest pain and palpitations.  Gastrointestinal: Positive for constipation. Negative for abdominal distention and abdominal pain.    Endocrine: Negative for polydipsia.  Genitourinary: Negative for dysuria and frequency.  Musculoskeletal: Negative for arthralgias and back pain.  Skin: Negative for rash.  Neurological: Negative for tremors, light-headedness and numbness.  Hematological: Does not bruise/bleed easily.  Psychiatric/Behavioral: Negative for agitation and behavioral problems.    Objective:  BP (!) 162/72    Pulse 76    Ht 5' 5" (1.651 m)    Wt 163 lb (73.9 kg)    SpO2 99%    BMI 27.12 kg/m   BP/Weight 05/27/2020 05/12/2020 7/86/7544  Systolic BP 920 100 712  Diastolic BP 72 69 70  Wt. (Lbs) 163 166 168.2  BMI 27.12 27.62 27.99  Some encounter information is confidential  and restricted. Go to Review Flowsheets activity to see all data.      Physical Exam Constitutional:      Appearance: She is well-developed.  Neck:     Vascular: No JVD.  Cardiovascular:     Rate and Rhythm: Normal rate.     Heart sounds: Normal heart sounds. No murmur heard.   Pulmonary:     Effort: Pulmonary effort is normal.     Breath sounds: Normal breath sounds. No wheezing or rales.  Chest:     Chest wall: No tenderness.  Abdominal:     General: Bowel sounds are normal. There is no distension.     Palpations: Abdomen is soft. There is no mass.     Tenderness: There is no abdominal tenderness.  Musculoskeletal:        General: Normal range of motion.     Right lower leg: No edema.     Left lower leg: No edema.  Neurological:     Mental Status: She is alert and oriented to person, place, and time.  Psychiatric:        Mood and Affect: Mood normal.     CMP Latest Ref Rng & Units 10/30/2019 09/25/2019 09/25/2019  Glucose 65 - 99 mg/dL 230(H) 276(H) 232(H)  BUN 8 - 27 mg/dL 14 25(H) 23  Creatinine 0.57 - 1.00 mg/dL 1.28(H) 1.58(H) 1.36(H)  Sodium 134 - 144 mmol/L 141 136 139  Potassium 3.5 - 5.2 mmol/L 3.7 4.4 3.7  Chloride 96 - 106 mmol/L 100 99 101  CO2 20 - 29 mmol/L _0 Calcium 8.7 - 10.3 mg/dL 9.3 9.0  9.0  Total Protein 6.5 - 8.1 g/dL - - 6.8  Total Bilirubin 0.3 - 1.2 mg/dL - - 0.2(L)  Alkaline Phos 38 - 126 U/L - - 95  AST 15 - 41 U/L - - 11(L)  ALT 0 - 44 U/L - - 11    Lipid Panel     Component Value Date/Time   CHOL 211 (H) 09/22/2019 1753   CHOL 209 (H) 01/16/2019 1052   TRIG 185 (H) 09/22/2019 1753   HDL 41 09/22/2019 1753   HDL 48 01/16/2019 1052   CHOLHDL 5.1 09/22/2019 1753   VLDL 37 09/22/2019 1753   LDLCALC 133 (H) 09/22/2019 1753   LDLCALC 139 (H) 01/16/2019 1052    CBC    Component Value Date/Time   WBC 7.7 09/25/2019 1532   RBC 4.42 09/25/2019 1532   HGB 12.0 09/25/2019 1532   HGB 12.1 01/16/2019 1052   HCT 39.1 09/25/2019 1532   HCT 37.4 01/16/2019 1052   PLT 261 09/25/2019 1532   PLT 286 01/16/2019 1052   MCV 88.5 09/25/2019 1532   MCV 84 01/16/2019 1052   MCH 27.1 09/25/2019 1532   MCHC 30.7 09/25/2019 1532   RDW 13.4 09/25/2019 1532   RDW 13.4 01/16/2019 1052   LYMPHSABS 2.1 01/16/2019 1052   MONOABS 0.6 09/18/2018 1612   EOSABS 1.0 (H) 01/16/2019 1052   BASOSABS 0.1 01/16/2019 1052    Lab Results  Component Value Date   HGBA1C 10.0 (A) 05/27/2020    Assessment & Plan:  1. Type 2 diabetes mellitus with 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 gangrene, with long-term current use of insulin (HCC) Uncontrolled with A1c of 10.0; goal is less than 7.0 Increase Lantus to 20 units daily and advised to uptitrate by 2 units every fourth day until blood sugars are at goal Counseled on Diabetic diet, my plate method, 536  minutes of moderate intensity exercise/week Blood sugar logs with fasting goals of 80-120 mg/dl, random of less than 180 and in the event of sugars less than 60 mg/dl or greater than 400 mg/dl encouraged to notify the clinic. Advised on the need for annual eye exams, annual foot exams, Pneumonia vaccine. - POCT glucose (manual entry) - POCT glycosylated hemoglobin (Hb A1C) - insulin glargine (LANTUS) 100 UNIT/ML injection; Inject  0.2 mLs (20 Units total) into the skin at bedtime. Increase by 2 units every 4th day until blood sugars are at goal. Max daily dose 30 units  Dispense: 30 mL; Refill: 6 - glipiZIDE (GLUCOTROL) 10 MG tablet; Take 1 tablet (10 mg total) by mouth 2 (two) times daily before a meal.  Dispense: 30 tablet; Refill: 6 - rosuvastatin (CRESTOR) 40 MG tablet; Take 1 tablet (40 mg total) by mouth daily at 6 PM.  Dispense: 30 tablet; Refill: 6 - CMP14+EGFR - Lipid panel - Microalbumin / creatinine urine ratio  2. Hypertensive heart disease with chronic diastolic congestive heart failure ([place]) Uncontrolled Yet to take antihypertensive today Increase hydrochlorothiazide to 25 mg daily - cloNIDine (CATAPRES) 0.1 MG tablet; Take 1 tablet (0.1 mg total) by mouth 2 (two) times daily.  Dispense: 60 tablet; Refill: 6 - diltiazem (CARDIZEM CD) 120 MG 24 hr capsule; Take 1 capsule (120 mg total) by mouth daily.  Dispense: 30 capsule; Refill: 6 - hydrALAZINE (APRESOLINE) 100 MG tablet; Take 1 tablet (100 mg total) by mouth 2 (two) times daily.  Dispense: 60 tablet; Refill: 6 - hydrochlorothiazide (MICROZIDE) 12.5 MG capsule; Take 1 capsule (12.5 mg total) by mouth daily.  Dispense: 30 capsule; Refill: 6 - potassium chloride SA (KLOR-CON M20) 20 MEQ tablet; Take 1 tablet (20 mEq total) by mouth daily.  Dispense: 30 tablet; Refill: 6  3. History of stroke No residual deficits Risk factor modification - clopidogrel (PLAVIX) 75 MG tablet; Take 1 tablet (75 mg total) by mouth daily.  Dispense: 30 tablet; Refill: 6  4. Recurrent major depressive disorder, remission status unspecified (HCC) Controlled - escitalopram (LEXAPRO) 20 MG tablet; Take 1 tablet (20 mg total) by mouth daily.  Dispense: 30 tablet; Refill: 6  5. Diabetic nephropathy associated with type 2 diabetes mellitus (HCC) Stable - gabapentin (NEURONTIN) 300 MG capsule; Take 1 capsule (300 mg total) by mouth 3 (three) times daily.  Dispense: 90 capsule;  Refill: 6  6. Abdominal bloating Uncontrolled Currently on PPI and advised to use simethicone She will need upper endoscopy and colonoscopy For referred to [facility] GI given long wait with Maryanna Shape - Ambulatory referral to Gastroenterology  7. Tobacco abuse Spent 3 minutes counseling on cessation and she is ready to work on quitting - nicotine (NICODERM CQ) 7 mg/24hr patch; Place 1 patch (7 mg total) onto the skin daily.  Dispense: 28 patch; Refill: 3    Meds ordered this encounter  Medications   insulin glargine (LANTUS) 100 UNIT/ML injection    Sig: Inject 0.2 mLs (20 Units total) into the skin at bedtime. Increase by 2 units every 4th day until blood sugars are at goal. Max daily dose 30 units    Dispense:  30 mL    Refill:  6   cloNIDine (CATAPRES) 0.1 MG tablet    Sig: Take 1 tablet (0.1 mg total) by mouth 2 (two) times daily.    Dispense:  60 tablet    Refill:  6   clopidogrel (PLAVIX) 75 MG tablet    Sig: Take 1 tablet (  75 mg total) by mouth daily.    Dispense:  30 tablet    Refill:  6   diltiazem (CARDIZEM CD) 120 MG 24 hr capsule    Sig: Take 1 capsule (120 mg total) by mouth daily.    Dispense:  30 capsule    Refill:  6   escitalopram (LEXAPRO) 20 MG tablet    Sig: Take 1 tablet (20 mg total) by mouth daily.    Dispense:  30 tablet    Refill:  6   gabapentin (NEURONTIN) 300 MG capsule    Sig: Take 1 capsule (300 mg total) by mouth 3 (three) times daily.    Dispense:  90 capsule    Refill:  6   hydrALAZINE (APRESOLINE) 100 MG tablet    Sig: Take 1 tablet (100 mg total) by mouth 2 (two) times daily.    Dispense:  60 tablet    Refill:  6    Dose increase   hydrochlorothiazide (MICROZIDE) 12.5 MG capsule    Sig: Take 1 capsule (12.5 mg total) by mouth daily.    Dispense:  30 capsule    Refill:  6   glipiZIDE (GLUCOTROL) 10 MG tablet    Sig: Take 1 tablet (10 mg total) by mouth 2 (two) times daily before a meal.    Dispense:  30 tablet    Refill:  6     Dose increase   potassium chloride SA (KLOR-CON M20) 20 MEQ tablet    Sig: Take 1 tablet (20 mEq total) by mouth daily.    Dispense:  30 tablet    Refill:  6    Please consider 90 day supplies to promote better adherence   rosuvastatin (CRESTOR) 40 MG tablet    Sig: Take 1 tablet (40 mg total) by mouth daily at 6 PM.    Dispense:  30 tablet    Refill:  6   nicotine (NICODERM CQ) 7 mg/24hr patch    Sig: Place 1 patch (7 mg total) onto the skin daily.    Dispense:  28 patch    Refill:  3    Follow-up: Return in about 3 months (around 08/26/2020) for chronic disease managemnt.       Charlott Rakes, MD, FAAFP. [facility] and [place], [place]   05/27/2020, 10:52 AM

## 2020-05-27 NOTE — Progress Notes (Signed)
Can not get into GI until November.  Wants to try patches to quit smoking.

## 2020-05-27 NOTE — Patient Instructions (Signed)
Abdominal Bloating When you have abdominal bloating, your abdomen may feel full, tight, or painful. It may also look bigger than normal or swollen (distended). Common causes of abdominal bloating include:  Swallowing air.  Constipation.  Problems digesting food.  Eating too much.  Irritable bowel syndrome. This is a condition that affects the large intestine.  Lactose intolerance. This is an inability to digest lactose, a natural sugar in dairy products.  Celiac disease. This is a condition that affects the ability to digest gluten, a protein found in some grains.  Gastroparesis. This is a condition that slows down the movement of food in the stomach and small intestine. It is more common in people with diabetes mellitus.  Gastroesophageal reflux disease (GERD). This is a digestive condition that makes stomach acid flow back into the esophagus.  Urinary retention. This means that the body is holding onto urine, and the bladder cannot be emptied all the way. Follow these instructions at home: Eating and drinking  Avoid eating too much.  Try not to swallow air while talking or eating.  Avoid eating while lying down.  Avoid these foods and drinks: ? Foods that cause gas, such as broccoli, cabbage, cauliflower, and baked beans. ? Carbonated drinks. ? Hard candy. ? Chewing gum. Medicines  Take over-the-counter and prescription medicines only as told by your health care provider.  Take probiotic medicines. These medicines contain live bacteria or yeasts that can help digestion.  Take coated peppermint oil capsules. Activity  Try to exercise regularly. Exercise may help to relieve bloating that is caused by gas and relieve constipation. General instructions  Keep all follow-up visits as told by your health care provider. This is important. Contact a health care provider if:  You have nausea and vomiting.  You have diarrhea.  You have abdominal pain.  You have unusual  weight loss or weight gain.  You have severe pain, and medicines do not help. Get help right away if:  You have severe chest pain.  You have trouble breathing.  You have shortness of breath.  You have trouble urinating.  You have darker urine than normal.  You have blood in your stools or have dark, tarry stools. Summary  Abdominal bloating means that the abdomen is swollen.  Common causes of abdominal bloating are swallowing air, constipation, and problems digesting food.  Avoid eating too much and avoid swallowing air.  Avoid foods that cause gas, carbonated drinks, hard candy, and chewing gum. This information is not intended to replace advice given to you by your health care provider. Make sure you discuss any questions you have with your health care provider. Document Revised: 12/31/2018 Document Reviewed: 10/14/2016 Elsevier Patient Education  2020 [employer]vier Inc.  

## 2020-05-28 LAB — LIPID PANEL
Chol/HDL Ratio: 2.9 ratio (ref 0.0–4.4)
Cholesterol, Total: 138 mg/dL (ref 100–199)
HDL: 47 mg/dL (ref >39)
LDL Chol Calc (NIH): 69 mg/dL (ref 0–99)
Triglycerides: 120 mg/dL (ref 0–149)
VLDL Cholesterol Cal: 22 mg/dL (ref 5–40)

## 2020-05-28 LAB — CMP14+EGFR
ALT: 11 IU/L (ref 0–32)
AST: 14 IU/L (ref 0–40)
Albumin/Globulin Ratio: 1.6 (ref 1.2–2.2)
Albumin: 4.5 g/dL (ref 3.8–4.8)
Alkaline Phosphatase: 140 IU/L — ABNORMAL HIGH (ref 48–121)
BUN/Creatinine Ratio: 12 (ref 12–28)
BUN: 14 mg/dL (ref 8–27)
Bilirubin Total: 0.3 mg/dL (ref 0.0–1.2)
CO2: 28 mmol/L (ref 20–29)
Calcium: 9.1 mg/dL (ref 8.7–10.3)
Chloride: 97 mmol/L (ref 96–106)
Creatinine, Ser: 1.18 mg/dL — ABNORMAL HIGH (ref 0.57–1.00)
GFR calc Af Amer: 57 mL/min/{1.73_m2} — ABNORMAL LOW (ref >59)
GFR calc non Af Amer: 50 mL/min/{1.73_m2} — ABNORMAL LOW (ref >59)
Globulin, Total: 2.9 g/dL (ref 1.5–4.5)
Glucose: 300 mg/dL — ABNORMAL HIGH (ref 65–99)
Potassium: 3.5 mmol/L (ref 3.5–5.2)
Sodium: 139 mmol/L (ref 134–144)
Total Protein: 7.4 g/dL (ref 6.0–8.5)

## 2020-05-28 LAB — MICROALBUMIN / CREATININE URINE RATIO
Creatinine, Urine: 62 mg/dL
Microalb/Creat Ratio: 32 mg/g creat — ABNORMAL HIGH (ref 0–29)
Microalbumin, Urine: 19.6 ug/mL

## 2020-05-29 ENCOUNTER — Telehealth: Payer: Self-pay

## 2020-05-29 NOTE — Telephone Encounter (Signed)
-----   Message from Charlott Rakes, MD sent at 05/28/2020  1:06 PM EDT ----- Labs reveal elevated glucose, presence of protein in the urine which is an early indicator of diabetes affecting the kidneys.  Please advised to comply with prescribed regimen at her most recent office visit and a diabetic diet.

## 2020-05-29 NOTE — Telephone Encounter (Signed)
Patient was called and informed of results via voicemail. 

## 2020-06-23 ENCOUNTER — Telehealth: Payer: Self-pay | Admitting: Family Medicine

## 2020-06-23 NOTE — Telephone Encounter (Signed)
Patient called to schedule an appt. With the behavioral counselor, [place].  Please call patient back to schedule appt. At [phone]

## 2020-07-02 NOTE — Telephone Encounter (Signed)
Call placed to patient. LCSW left message requesting a return call.  

## 2020-07-10 NOTE — Telephone Encounter (Signed)
Call placed to patient. LCSW left message requesting a return call.  

## 2020-07-13 ENCOUNTER — Telehealth: Payer: Self-pay | Admitting: Family Medicine

## 2020-07-13 NOTE — Telephone Encounter (Signed)
Patient is calling to request if referral could be faxed to [facility] - they have not received it. Fax number of [facility] was not provided Please advise with patent CB- [phone]

## 2020-07-14 NOTE — Telephone Encounter (Signed)
Noted   I wasn't aware of patient preference . Thanks   Referral sent thru Epic   Per Patient request  Sent referral to   [facility]

## 2020-07-29 ENCOUNTER — Ambulatory Visit: Payer: No Typology Code available for payment source | Admitting: Licensed Clinical Social Worker

## 2020-07-29 ENCOUNTER — Other Ambulatory Visit: Payer: Self-pay

## 2020-08-03 ENCOUNTER — Ambulatory Visit: Payer: Self-pay | Admitting: Licensed Clinical Social Worker

## 2020-08-03 ENCOUNTER — Other Ambulatory Visit: Payer: Self-pay

## 2020-08-11 ENCOUNTER — Ambulatory Visit: Payer: No Typology Code available for payment source | Admitting: Obstetrics and Gynecology

## 2020-08-12 ENCOUNTER — Telehealth: Payer: Self-pay | Admitting: Licensed Clinical Social Worker

## 2020-08-12 ENCOUNTER — Institutional Professional Consult (permissible substitution): Payer: Self-pay | Admitting: Licensed Clinical Social Worker

## 2020-08-12 NOTE — Telephone Encounter (Signed)
Call placed to patient regarding scheduled [facility] appointment. LCSW left message requesting a return call.  

## 2020-09-23 ENCOUNTER — Other Ambulatory Visit: Payer: Self-pay | Admitting: Family Medicine

## 2020-09-23 DIAGNOSIS — Z794 Long term (current) use of insulin: Secondary | ICD-10-CM

## 2020-09-24 MED FILL — !NOVOLOG 100UNITS/ML VIAL: 100/ML | 28 days supply | Qty: 10 | Fill #0

## 2020-09-29 ENCOUNTER — Ambulatory Visit: Payer: Medicaid Other | Admitting: Obstetrics & Gynecology

## 2020-10-20 ENCOUNTER — Ambulatory Visit: Payer: Medicaid Other | Admitting: Obstetrics & Gynecology

## 2020-10-21 ENCOUNTER — Ambulatory Visit: Payer: Self-pay | Admitting: *Deleted

## 2020-10-21 NOTE — Telephone Encounter (Signed)
Please schedule with any provider

## 2020-10-21 NOTE — Telephone Encounter (Signed)
Patient states she had COVID 3 weeks ago- she has not rebounded as she soul- she has weakness and fatigue that is not better and her depression is worse- patient did schedule appointment- but needs sooner. Call came in at lunchtime and office is closed- call sent for review and to see if patient can be seen sooner.  Reason for Disposition . [1] PERSISTING SYMPTOMS OF COVID-19 AND [2] symptoms WORSE . [1] Depression AND [2] worsening (e.g.,sleeping poorly, less able to do activities of daily living)  Answer Assessment - Initial Assessment Questions 1. DESCRIPTION: "Describe how you are feeling."     Patient is s/p COVID- 3 weeks ago 2. SEVERITY: "How bad is it?"  "Can you stand and walk?"   - MILD - Feels weak or tired, but does not interfere with work, school or normal activities   - [place] to stand and walk; weakness interferes with work, school, or normal activities   - SEVERE - Unable to stand or walk     Mild/moderate- weakness in legs 3. ONSET:  "When did the weakness begin?"     3 weeks- patient states she would feel ok- then go to work and feel bad 4. CAUSE: "What do you think is causing the weakness?"     Post COVID 5. MEDICINES: "Have you recently started a new medicine or had a change in the amount of a medicine?"     No changes 6. OTHER SYMPTOMS: "Do you have any other symptoms?" (e.g., chest pain, fever, cough, SOB, vomiting, diarrhea, bleeding, other areas of pain)     depression 7. PREGNANCY: "Is there any chance you are pregnant?" "When was your last menstrual period?"     n/a  Answer Assessment - Initial Assessment Questions 1. COVID-19 ONSET: "When did the symptoms of COVID-19 first start?"     3 weeks- test + COVID 2. DIAGNOSIS CONFIRMATION: "How were you diagnosed?" (e.g., COVID-19 oral or nasal viral test; COVID-19 antibody test; doctor visit)     Nasal viral tetse 3. MAIN SYMPTOM:  "What is your main concern or symptom right now?" (e.g., breathing  difficulty, cough, fatigue. loss of smell)     Fatigue- weakness in legs, depression 4. SYMPTOM ONSET: "When did the    start?"     2 weeks ago patient has weakness in legs, 1 week ago depression 5. BETTER-SAME-WORSE: "Are you getting better, staying the same, or getting worse over the last 1 to 2 weeks?"     better 6. RECENT MEDICAL VISIT: "Have you been seen by a healthcare provider (doctor, NP, PA) for these persisting COVID-19 symptoms?" If Yes, ask: "When were you seen?" (e.g., date)     Patient has not been seen for COVID diagnosis 7. COUGH: "Do you have a cough?" If Yes, ask: "How bad is the cough?"       no 8. FEVER: "Do you have a fever?" If Yes, ask: "What is your temperature, how was it measured, and when did it start?"     no 9. BREATHING DIFFICULTY: "Are you having any trouble breathing?" If Yes, ask: "How bad is your breathing?" (e.g., mild, moderate, severe)    - MILD: No SOB at rest, mild SOB with walking, speaks normally in sentences, can lie down, no retractions, pulse < 100.    - MODERATE: SOB at rest, SOB with minimal exertion and prefers to sit, cannot lie down flat, speaks in phrases, mild retractions, audible wheezing, pulse 100-120.    - SEVERE:  Very SOB at rest, speaks in single words, struggling to breathe, sitting hunched forward, retractions, pulse > 120      Breathing is good 10. HIGH RISK DISEASE: "Do you have any chronic medical problems?" (e.g., asthma, heart or lung disease, weak immune system, obesity, etc.)       Asthma, diabetes, hypertension 11. VACCINE: "Have you gotten the COVID-19 vaccine?" If Yes ask: "Which one, how many shots, when did you get it?"       no 12. PREGNANCY: "Is there any chance you are pregnant?" "When was your last menstrual period?"       n/a 13. OTHER SYMPTOMS: "Do you have any other symptoms?"  (e.g., fatigue, headache, muscle pain, weakness)       weakness  Answer Assessment - Initial Assessment Questions 1. CONCERN: "What  happened that made you call today?"     Post COVID- patient missed December appointment- she called for appointment 2. DEPRESSION SYMPTOM SCREENING: "How are you feeling overall?" (e.g., decreased energy, increased sleeping or difficulty sleeping, difficulty concentrating, feelings of sadness, guilt, hopelessness, or worthlessness)     Decreased energy, feels down- not laughing or smiling- family has noticed 3. RISK OF HARM - SUICIDAL IDEATION:  "Do you ever have thoughts of hurting or killing yourself?"  (e.g., yes, no, no but preoccupation with thoughts about death)   - INTENT:  "Do you have thoughts of hurting or killing yourself right NOW?" (e.g., yes, no, N/A)   - PLAN: "Do you have a specific plan for how you would do this?" (e.g., gun, knife, overdose, no plan, N/A)     No- not thinking of suicide 4. RISK OF HARM - HOMICIDAL IDEATION:  "Do you ever have thoughts of hurting or killing someone else?"  (e.g., yes, no, no but preoccupation with thoughts about death)   - INTENT:  "Do you have thoughts of hurting or killing someone right NOW?" (e.g., yes, no, N/A)   - PLAN: "Do you have a specific plan for how you would do this?" (e.g., gun, knife, no plan, N/A)      no 5. FUNCTIONAL IMPAIRMENT: "How have things been going for you overall? Have you had more difficulty than usual doing your normal daily activities?"  (e.g., better, same, worse; self-care, school, work, interactions)     Patient recent [place]- stable work and home life 6. SUPPORT: "Who is with you now?" "Who do you live with?" "Do you have family or friends who you can talk to?"      Lives with family- they are good support 7. THERAPIST: "Do you have a counselor or therapist? Name?"     History of therapy- not current with anyone 8. STRESSORS: "Has there been any new stress or recent changes in your life?"     COVID- fatigue 9. ALCOHOL USE OR SUBSTANCE USE (DRUG USE): "Do you drink alcohol or use any illegal drugs?"     In past-  no current use 10. OTHER: "Do you have any other physical symptoms right now?" (e.g., fever)       Forcing herself to eat 11. PREGNANCY: "Is there any chance you are pregnant?" "When was your last menstrual period?"       n/a  Protocols used: CORONAVIRUS (COVID-19) PERSISTING SYMPTOMS FOLLOW-UP CALL-A-AH, DEPRESSION-A-AH, WEAKNESS (GENERALIZED) AND FATIGUE-A-AH

## 2020-10-22 ENCOUNTER — Ambulatory Visit: Payer: Self-pay | Attending: Family Medicine | Admitting: Family Medicine

## 2020-10-22 ENCOUNTER — Other Ambulatory Visit: Payer: Self-pay

## 2020-10-22 DIAGNOSIS — F339 Major depressive disorder, recurrent, unspecified: Secondary | ICD-10-CM

## 2020-10-22 DIAGNOSIS — Z8616 Personal history of COVID-19: Secondary | ICD-10-CM

## 2020-10-22 MED ORDER — DULOXETINE HCL 60 MG PO CPEP: 60.0000 mg | ORAL_CAPSULE | Freq: Every day | ORAL | 6 refills | Status: DC

## 2020-10-22 NOTE — Progress Notes (Signed)
Get SOB when walking. States that she is dealing with depression.

## 2020-10-22 NOTE — Progress Notes (Signed)
Virtual Visit via Telephone Note  I connected with Lisa Anderson, on 10/22/2020 at 10:13 AM by telephone due to the COVID-19 pandemic and verified that I am speaking with the correct person using two identifiers.   Consent: I discussed the limitations, risks, security and privacy concerns of performing an evaluation and management service by telephone and the availability of in person appointments. I also discussed with the patient that there may be a patient responsible charge related to this service. The patient expressed understanding and agreed to proceed.   Location of Patient: Home  Location of Provider: Home   Persons participating in Telemedicine visit: Javonne Louissaint Farrington-CMA Dr. 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  Lives with daughter  Diagnosed with COVID 3 weeks ago but she was not hospitalized. Returned to work 1 week ago and dyspnea has improved as well as fatigue and yesterday she felt close to her usual self.  She has been crying a lot, feeling hopeless, anhedonia and her depression worsened. She felt isolated and sad. This happened over the period when she was diagnosed with COVID. She feels better now. States she took her daughter's Cymbalta and it worked for her. Past Medical History:  Diagnosis Date  . Asthma   . Cancer ([place])    breast  . Chronic congestive heart failure with left ventricular diastolic dysfunction ([place])   . Diabetes mellitus   . Hyperlipidemia   . Hypertension   . Stroke [facility])    Allergies  Allergen Reactions  . Prednisone Hives and Other (See Comments)    Made her "crazy", suicidal  .  Benicar [Olmesartan] Other (See Comments)    Did not work at all for patient  . Celexa [Citalopram] Other (See Comments)    Made patient feel crazy, fluoxetine is fine  . Effexor [Venlafaxine] Other (See Comments)    Made patient feel crazy  . Shrimp [Shellfish Allergy] Hives and Swelling    Current Outpatient Medications on File Prior to Visit  Medication Sig Dispense Refill  . albuterol (VENTOLIN HFA) 108 (90 Base) MCG/ACT inhaler Inhale 1-2 puffs into the lungs every 6 (six) hours as needed for wheezing or shortness of breath. 6.7 g 0  . cloNIDine (CATAPRES) 0.1 MG tablet Take 1 tablet (0.1 mg total) by mouth 2 (two) times daily. 60 tablet 6  . clopidogrel (PLAVIX) 75 MG tablet Take 1 tablet (75 mg total) by mouth daily. 30 tablet 6  . diltiazem (CARDIZEM CD) 120 MG 24 hr capsule Take 1 capsule (120 mg total) by mouth daily. 30 capsule 6  . escitalopram (LEXAPRO) 20 MG tablet Take 1 tablet (20 mg total) by mouth daily. 30 tablet 6  . gabapentin (NEURONTIN) 300 MG capsule Take 1 capsule (300 mg total) by mouth 3 (three) times daily. 90 capsule 6  . glipiZIDE (GLUCOTROL) 10 MG tablet Take 1 tablet (10 mg total) by mouth 2 (two) times daily before a meal. 30 tablet 6  . hydrALAZINE (APRESOLINE) 100 MG tablet Take 1 tablet (100 mg total) by mouth 2 (two) times daily. 60 tablet 6  . hydrochlorothiazide (HYDRODIURIL) 25 MG tablet Take 1 tablet (25 mg total) by mouth daily. 30 tablet  6  . insulin aspart (NOVOLOG) 100 UNIT/ML injection USE PER SLIDING SCALE. MAX OF 9 UNITS 3 TIMES DAILY 10 mL 0  . insulin glargine (LANTUS) 100 UNIT/ML injection Inject 0.2 mLs (20 Units total) into the skin at bedtime. Increase by 2 units every 4th day until blood sugars are at goal. Max daily dose 30 units 30 mL 6  . nicotine (NICODERM CQ) 7 mg/24hr patch Place 1 patch (7 mg total) onto the skin daily. 28 patch 3  . omeprazole (PRILOSEC) 20 MG capsule Take 1 capsule (20 mg total) by mouth 2 (two) times daily before  a meal. 30 capsule 0  . potassium chloride SA (KLOR-CON M20) 20 MEQ tablet Take 1 tablet (20 mEq total) by mouth daily. 30 tablet 6  . rosuvastatin (CRESTOR) 40 MG tablet Take 1 tablet (40 mg total) by mouth daily at 6 PM. 30 tablet 6  . traZODone (DESYREL) 50 MG tablet Take 1 tablet (50 mg total) by mouth at bedtime as needed for sleep. 15 tablet 0   No current facility-administered medications on file prior to visit.    ROS: As in HPI  Observations/Objective: Awake, alert, oriented x3 Not in acute distress Speaking in full sentences Normal mood.  Assessment and Plan: 1. Recurrent major depressive disorder, remission status unspecified ([place]) Uncontrolled switched from Lexapro to Cymbalta Likely related to COVID-19 infection Symptoms have improved ever since she discontinued isolation - DULoxetine (CYMBALTA) 60 MG capsule; Take 1 capsule (60 mg total) by mouth daily.  Dispense: 30 capsule; Refill: 6  2. History of COVID-19 Improved significantly.   Follow Up Instructions: 1 month for chronic disease management   I discussed the assessment and treatment plan with the patient. The patient was provided an opportunity to ask questions and all were answered. The patient agreed with the plan and demonstrated an understanding of the instructions.   The patient was advised to call back or seek an in-person evaluation if the symptoms worsen or if the condition fails to improve as anticipated.     I provided 12 minutes total of non-face-to-face time during this encounter including median intraservice time, reviewing previous notes, investigations, ordering medications, medical decision making, coordinating care and patient verbalized understanding at the end of the visit.     Charlott Rakes, MD, FAAFP. [facility] and [place], [place]   10/22/2020, 10:13 AM

## 2020-10-22 NOTE — Telephone Encounter (Signed)
Scheduled today @ 1010 with [place]- virtual.

## 2020-11-12 ENCOUNTER — Ambulatory Visit: Payer: Medicaid Other | Admitting: Physician Assistant

## 2020-11-20 ENCOUNTER — Ambulatory Visit: Payer: Medicaid Other | Admitting: Obstetrics

## 2020-12-08 ENCOUNTER — Telehealth: Payer: Self-pay | Admitting: Family Medicine

## 2020-12-08 MED ORDER — ALBUTEROL SULFATE HFA 108 (90 BASE) MCG/ACT IN AERS: 1.0000 | INHALATION_SPRAY | Freq: Four times a day (QID) | RESPIRATORY_TRACT | 2 refills | Status: DC | PRN

## 2020-12-08 NOTE — Telephone Encounter (Signed)
Rx sent 

## 2020-12-08 NOTE — Telephone Encounter (Signed)
Medication Refill - Medication: Albuterol inhaler  Has the patient contacted their pharmacy? Yes.  The pharmacy told her she needed to call the office for a new Rx. (Agent: If no, request that the patient contact the pharmacy for the refill.) (Agent: If yes, when and what did the pharmacy advise?)  Preferred Pharmacy (with phone number or street name):   Agent: Please be advised that RX refills may take up to 3 business days. We ask that you follow-up with your pharmacy.

## 2020-12-08 NOTE — Telephone Encounter (Signed)
Would you be able to refill medication. Rx was last prescribed in 09/25/19 by a different provider

## 2020-12-10 ENCOUNTER — Ambulatory Visit (INDEPENDENT_AMBULATORY_CARE_PROVIDER_SITE_OTHER): Payer: PRIVATE HEALTH INSURANCE | Admitting: Obstetrics

## 2020-12-10 ENCOUNTER — Encounter: Payer: Self-pay | Admitting: Obstetrics

## 2020-12-10 ENCOUNTER — Other Ambulatory Visit: Payer: Self-pay

## 2020-12-10 VITALS — BP 154/74 | HR 93 | Ht 65.0 in | Wt 161.0 lb

## 2020-12-10 DIAGNOSIS — R87610 Atypical squamous cells of undetermined significance on cytologic smear of cervix (ASC-US): Secondary | ICD-10-CM

## 2020-12-10 DIAGNOSIS — Z780 Asymptomatic menopausal state: Secondary | ICD-10-CM | POA: Diagnosis not present

## 2020-12-10 DIAGNOSIS — R87810 Cervical high risk human papillomavirus (HPV) DNA test positive: Secondary | ICD-10-CM | POA: Diagnosis not present

## 2020-12-10 NOTE — Progress Notes (Signed)
New Patient is in the office to establish care. Abnormal pap smear 05-12-20 at PCP with abnormal results. Last mammogram 01-06-20 Pt reports intermittent pelvic pain that has been going on for months 2/10.

## 2020-12-10 NOTE — Patient Instructions (Signed)
HPV and Cancer Information HPV (human papillomavirus)is a very common virus that spreads easily from person to person through skin-to-skin or sexual contact. There are many types of HPV. It often does not cause symptoms. However, depending upon the type, it may sometimes cause warts in the genitals (genital or mucosal HPV), or on the hands or feet (cutaneous or nonmucosal HPV). It is possible to be infected for a long time and pass HPV to others without knowing it. Some HPV infections go away on their own within 2 years, but other HPV infections are considered high-risk and may cause changes in cells that could lead to cancer. You can take steps to avoid HPV infection and to lower your risk of getting cancer. How can HPV affect me? HPV can cause warts in the genitals or on the hands or feet. It can also cause wart-like lesions in the throat.  Certain types of genital HPV can also cause cancer, which may include:  Cervical cancer.  Vaginal cancer.  Vulvar cancer.  Anal cancer.  Throat cancer.  Tongue or mouth cancer.  Penile cancer. How does HPV spread? HPV spreads easily through direct person to person contact. Genital HPV spreads through sexual contact. You can get HPV from vaginal sex, oral sex, anal sex, or just by touching someone's genitals. Even people who have only one sexual partner may have HPV because that partner may have it. HPV often does not cause symptoms, so most infected people do not know that they have it. What actions can I take to prevent HPV? Take the following steps to help prevent HPV infection:  Talk with your health care provider about getting the HPV vaccine. This vaccine protects against the types of HPV that could cause cancer.  Limit the number of people you have sex with. Also, avoid having sex with people who have had many sexual partners.  Use a condom during sex.  Talk with your sexual partners about their health.   What actions can I take to lower my  risk for cancer? Having a healthy lifestyle and taking some preventive steps can help lower your cancer risk, whether or not you have genital HPV. Some steps you can take include: Lifestyle  Practice safe sex to help prevent HPV infection.  Do not use any products that contain nicotine or tobacco, such as cigarettes, e-cigarettes, and chewing tobacco. If you need help quitting, ask your health care provider.  Eat foods that have antioxidants, such as fruits, vegetables, and grains. Try to eat at least 5 servings of fruits and vegetables every day.  Get regular exercise.  Lose weight if you are overweight.  Practice good oral hygiene. This includes flossing and brushing your teeth every day. Other preventive steps  Get the HPV vaccine as told by your health care provider.  Get tested for STIs even if you do not have symptoms of HPV. You may have HPV and not know it.  If you are a woman, get regular Pap and HPV tests. Talk with your health care provider about how often you need these tests. Pap tests will help identify changes in cells that can lead to cancer. HPV tests will help identify the presence of HPV in cells in the cervix. Where to find more information Learn more about HPV and cancer from:  Centers for Disease Control and Prevention: http://sweeney-todd.com/  [place]: www.cancer.gov  American Cancer Society: www.cancer.org Contact a health care provider if:  You have genital warts.  You are sexually  active and think you may have HPV.  You did not protect yourself during sex and would like to be tested for STIs. Summary  Human papillomavirus (HPV) is a very common virus that spreads easily from person to person and ishighly contagious.  Certain types of genital HPV are considered to be high risk and may cause changes in cells that could lead to cancer.  You should take steps to avoid HPV infection, such as limiting the number of people you have sex with,  using condoms during sex, and getting the HPV vaccine.  Lifestyle changes can help lower your risk of cancer. These include eating a healthy diet, getting regular exercise, and not using any products that contain nicotine or tobacco.  You may have HPV and not know it. Get tested for STIs even if you do not have symptoms of HPV. If you are a woman, have regular Pap tests and HPV tests as directed by your health care provider. This information is not intended to replace advice given to you by your health care provider. Make sure you discuss any questions you have with your health care provider. Document Revised: 04/28/2020 Document Reviewed: 04/28/2020 Elsevier Patient Education  [place].  [email or web address]  Colposcopy  Colposcopy is a procedure to examine the lowest part of the uterus (cervix), the vagina, and the area around the vaginal opening (vulva) for abnormalities or signs of disease. This procedure is done using an instrument that makes objects appear larger and provides light. (colposcope). During the procedure, the health care provider may remove a tissue sample to be looked at later under a microscope (biopsy). A biopsy may be done if any unusual cells are found during the colposcopy. You may have a colposcopy if you have:  An abnormal Pap smear, also called a Pap test. This screening test is used to check for signs of cancer or infection of the vagina, cervix, and uterus.  An HPV (human papillomavirus) test and get a positive result for a type of HPV that puts you at high risk of cancer.  Certain conditions or symptoms, such as: ? A sore, or lesion, on your cervix. ? Genital warts on your vulva, vagina, or cervix. ? Pain during sex. ? Vaginal bleeding, especially after sex.  A growth on your cervix (cervical polyp) that needs to be removed. Let your health care provider know about:  Any  allergies you have, including allergies to medicines, latex, or iodine.  All medicines you are taking, including vitamins, herbs, eye drops, creams, and over-the-counter medicines.  Any problems you or family members have had with anesthetic medicines.  Any blood disorders you have.  Any surgeries you have had.  Any medical conditions you have, such as pelvic inflammatory disease (PID) or endometrial disorder.  The pattern of your menstrual cycles and the form of birth control (contraception) you use, if any.  Your medical history, including any history of fainting often or of cervical treatment.  Whether you are pregnant or may be pregnant. What are the risks? Generally, this is a safe procedure. However, problems may occur, including:  Infection. Symptoms of infection may include fever, bad-smelling vaginal discharge, or pelvic pain.  Allergic reactions to medicines.  Damage to nearby structures or organs.  Fainting. This is rare. What happens before the procedure? Eating and drinking restrictions  Follow instructions from your health care provider about eating or drinking restrictions.  You will likely need to eat a regular diet the day of the procedure and  not skip any meals. Tests  You may have an exam or testing. A pregnancy test will be done the day of the procedure.  You may have a blood or urine sample taken. General instructions  Ask your health care provider about: ? Changing or stopping your regular medicines. This is especially important if you are taking diabetes medicines or blood thinners. ? Taking medicines such as aspirin and ibuprofen. These medicines can thin your blood. Do not take these medicines unless your health care provider tells you to take them. Your health care provider will likely tell you to avoid taking aspirin, or medicine that contains aspirin, for 7 days before the procedure. ? Taking over-the-counter medicines, vitamins, herbs, and  supplements.  Tell your health care provider if you have your menstrual period now or will have it at the time of your procedure. A colposcopy is not normally done during your menstrual period.  If you use contraception, continue to use it before your procedure.  For 24 hours before the procedure: ? Do not use douche products or tampons. ? Do not use medicines, creams, or suppositories in the vagina. ? Do not have sex.  Ask your health care provider what steps will be taken to prevent infection. What happens during the procedure?  You will lie down on your back, with your feet in foot rests (stirrups).  A tool called a speculum will be warmed and will have oil or gel put on it (will be lubricated). The speculum will then be inserted into your vagina. This will be used to hold apart the walls of your vagina so your health care provider can see your cervix and the inside of your vagina.  A cotton swab will be used to place a small amount of a liquid (solution) on the areas to be examined. This solution makes it easier to see abnormal cells. You may feel a slight burning during this part.  The colposcope will be used to scan the cervix with a bright white light. The colposcope will be held near your vulva and will make your vulva, vagina, and cervix look bigger so they can be seen better.  If a biopsy is needed: ? You may be given a medicine to numb the area (local anesthetic). ? Surgical tools will be used to remove mucus and cells through your vagina. ? You may feel mild pain while the tissue sample is removed. ? Bleeding may occur. A solution may be used to stop the bleeding. ? If a biopsy is needed from the inside of the cervix, a different procedure called endocervical curettage (ECC) may be done. During this procedure, a curved tool called a curette will be used to scrape cells from your cervix or the top of your cervix (endocervix).  Any abnormalities that are found will be  recorded. The procedure may vary among health care providers and hospitals. What happens after the procedure?  You will lie down and rest for a few minutes. You may be offered juice or cookies.  Your blood pressure, heart rate, breathing rate, and blood oxygen level will be monitored until you leave the hospital or clinic.  You may have some cramping in your abdomen. This should go away after a few minutes.  It is up to you to get the results of your procedure. Ask your health care provider, or the department that is doing the procedure, when your results will be ready. Summary  Colposcopy is a procedure to examine the lowest part  of the uterus (cervix), the vagina, and the area around the vaginal opening (vulva) for abnormalities or signs of disease.  A biopsy may be done as part of the procedure.  After the procedure, you will remain lying down and will rest for a few minutes.  You may have some cramping in your abdomen. This should go away after a few minutes. This information is not intended to replace advice given to you by your health care provider. Make sure you discuss any questions you have with your health care provider. Document Revised: 09/11/2019 Document Reviewed: 09/11/2019 Elsevier Patient Education  2021 [employer].

## 2020-12-10 NOTE — Progress Notes (Unsigned)
Patient ID: Lisa Anderson, female   DOB: 05/06/1956, 65 y.o.   MRN: [id number]  Chief Complaint  Patient presents with  . New Patient (Initial Visit)    HPI Lisa Anderson is a 65 y.o. female.  History of abnormal pap smear.  Presents in referral for consultation and management. HPI  Past Medical History:  Diagnosis Date  . Asthma   . Cancer ([place])    breast  . Chronic congestive heart failure with left ventricular diastolic dysfunction ([place])   . Diabetes mellitus   . Hyperlipidemia   . Hypertension   . Stroke [facility])     Past Surgical History:  Procedure Laterality Date  . BREAST BIOPSY Right 2007  . BREAST LUMPECTOMY Right 2007  . EP IMPLANTABLE DEVICE N/A 08/07/2015   Procedure: Loop Recorder Insertion;  Surgeon: Thompson Grayer, MD;  Location: [facility];  Service: Cardiovascular;  Laterality: N/A;  . TEE WITHOUT CARDIOVERSION N/A 08/07/2015   Procedure: TRANSESOPHAGEAL ECHOCARDIOGRAM (TEE);  Surgeon: Sueanne Margarita, MD;  Location: [facility] ENDOSCOPY;  Service: Cardiovascular;  Laterality: N/A;  . TONSILLECTOMY    . TUBAL LIGATION  1991    Family History  Problem Relation Age of Onset  . Diabetes Mother   . Hyperlipidemia Mother   . Hypertension Mother   . Breast cancer Mother   . Rheum arthritis Father   . Asthma Father   . Stroke Father   . Thyroid disease Father   . Thyroid disease Brother   . Colon cancer Brother        in his 7s  . Breast cancer Sister   . Cervical cancer Paternal Aunt     Social History Social History   Tobacco Use  . Smoking status: Current Every Day Smoker    Packs/day: 0.25    Types: Cigarettes  . Smokeless tobacco: Never Used  . Tobacco comment: pt smokes 8 per day  Vaping Use  . Vaping Use: Never used  Substance Use Topics  . Alcohol use: Yes    Alcohol/week: 6.0 standard drinks    Types: 6 Cans of beer per week    Comment: occasionally  . Drug use: Not Currently    Types: "Crack" cocaine, Cocaine    Comment: last used  a year ago    Allergies  Allergen Reactions  . Prednisone Hives and Other (See Comments)    Made her "crazy", suicidal  . Benicar [Olmesartan] Other (See Comments)    Did not work at all for patient  . Celexa [Citalopram] Other (See Comments)    Made patient feel crazy, fluoxetine is fine  . Effexor [Venlafaxine] Other (See Comments)    Made patient feel crazy  . Shrimp [Shellfish Allergy] Hives and Swelling    Current Outpatient Medications  Medication Sig Dispense Refill  . albuterol (VENTOLIN HFA) 108 (90 Base) MCG/ACT inhaler Inhale 1-2 puffs into the lungs every 6 (six) hours as needed for wheezing or shortness of breath. 6.7 g 2  . cloNIDine (CATAPRES) 0.1 MG tablet Take 1 tablet (0.1 mg total) by mouth 2 (two) times daily. 60 tablet 6  . clopidogrel (PLAVIX) 75 MG tablet Take 1 tablet (75 mg total) by mouth daily. 30 tablet 6  . diltiazem (CARDIZEM CD) 120 MG 24 hr capsule Take 1 capsule (120 mg total) by mouth daily. 30 capsule 6  . DULoxetine (CYMBALTA) 60 MG capsule Take 1 capsule (60 mg total) by mouth daily. 30 capsule 6  . gabapentin (NEURONTIN) 300 MG capsule  Take 1 capsule (300 mg total) by mouth 3 (three) times daily. 90 capsule 6  . glipiZIDE (GLUCOTROL) 10 MG tablet Take 1 tablet (10 mg total) by mouth 2 (two) times daily before a meal. 30 tablet 6  . hydrochlorothiazide (HYDRODIURIL) 25 MG tablet Take 1 tablet (25 mg total) by mouth daily. 30 tablet 6  . insulin aspart (NOVOLOG) 100 UNIT/ML injection USE PER SLIDING SCALE. MAX OF 9 UNITS 3 TIMES DAILY 10 mL 0  . insulin glargine (LANTUS) 100 UNIT/ML injection Inject 0.2 mLs (20 Units total) into the skin at bedtime. Increase by 2 units every 4th day until blood sugars are at goal. Max daily dose 30 units 30 mL 6  . potassium chloride SA (KLOR-CON M20) 20 MEQ tablet Take 1 tablet (20 mEq total) by mouth daily. 30 tablet 6  . rosuvastatin (CRESTOR) 40 MG tablet Take 1 tablet (40 mg total) by mouth daily at 6 PM. 30  tablet 6  . traZODone (DESYREL) 50 MG tablet Take 1 tablet (50 mg total) by mouth at bedtime as needed for sleep. 15 tablet 0  . hydrALAZINE (APRESOLINE) 100 MG tablet Take 1 tablet (100 mg total) by mouth 2 (two) times daily. (Patient not taking: Reported on 12/10/2020) 60 tablet 6  . nicotine (NICODERM CQ) 7 mg/24hr patch Place 1 patch (7 mg total) onto the skin daily. (Patient not taking: Reported on 12/10/2020) 28 patch 3  . omeprazole (PRILOSEC) 20 MG capsule Take 1 capsule (20 mg total) by mouth 2 (two) times daily before a meal. (Patient not taking: Reported on 12/10/2020) 30 capsule 0   No current facility-administered medications for this visit.    Review of Systems Review of Systems Constitutional: negative for fatigue and weight loss Respiratory: negative for cough and wheezing Cardiovascular: negative for chest pain, fatigue and palpitations Gastrointestinal: negative for abdominal pain and change in bowel habits Genitourinary:negative Integument/breast: negative for nipple discharge Musculoskeletal:negative for myalgias Neurological: negative for gait problems and tremors Behavioral/Psych: negative for abusive relationship, depression Endocrine: negative for temperature intolerance      Blood pressure (!) 154/74, pulse 93, height 5\' 5"  (1.651 m), weight 161 lb (73 kg).  Physical Exam Physical Exam General:   alert and no distress  Skin:   no rash or abnormalities  Lungs:   clear to auscultation bilaterally  Heart:   regular rate and rhythm, S1, S2 normal, no murmur, click, rub or gallop  Breasts:   normal without suspicious masses, skin or nipple changes or axillary nodes    50% of 15 min visit spent on counseling and coordination of care.   Data Reviewed Pap Smear  Assessment     1. ASCUS with positive high risk HPV cervical - discussed HPV and abnormal pap smears and cancer - all questions answered - educational material dispensed   2. Postmenopause - doing  well    Plan   Follow up in 3 weeks for colposcopy  Shelly Bombard, MD 12/10/2020 5:05 PM

## 2020-12-22 ENCOUNTER — Other Ambulatory Visit: Payer: Self-pay | Admitting: Family Medicine

## 2020-12-22 DIAGNOSIS — E1151 Type 2 diabetes mellitus with 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 gangrene: Secondary | ICD-10-CM

## 2020-12-22 DIAGNOSIS — Z794 Long term (current) use of insulin: Secondary | ICD-10-CM

## 2020-12-22 NOTE — Telephone Encounter (Signed)
Requested medication (s) are due for refill today: yes  Requested medication (s) are on the active medication list: yes  Last refill:  09/23/20  Future visit scheduled: no  Notes to clinic:  overdue lab work   Requested Prescriptions  Pending Prescriptions Disp Refills   insulin aspart (NOVOLOG) 100 UNIT/ML injection [Pharmacy Med Name: NOVOLOG 100UNITS/ML VIAL 100/ML VIAL] 10 mL 0    Sig: USE PER SLIDING SCALE. MAX OF 9 UNITS 3 TIMES DAILY      Endocrinology:  Diabetes - Insulins Failed - 12/22/2020  4:10 PM      Failed - HBA1C is between 0 and 7.9 and within 180 days    HbA1c, POC (controlled diabetic range)  Date Value Ref Range Status  05/27/2020 10.0 (A) 0.0 - 7.0 % Final          Passed - Valid encounter within last 6 months    Recent Outpatient Visits           2 months ago Recurrent major depressive disorder, remission status unspecified ([place])   [place], [place], MD   6 months ago Type 2 diabetes mellitus with 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 gangrene, with long-term current use of insulin ([place])   [place], Charlane Ferretti, MD   7 months ago Colon cancer screening   [place], [place], MD   10 months ago Type 2 diabetes mellitus with 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 gangrene, with long-term current use of insulin ([place])   [place], [place]ain[place], MD   1 year ago Type 2 diabetes mellitus with 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 gangrene, with long-term current use of insulin ([place])   [facility] Charlott Rakes, MD

## 2020-12-23 ENCOUNTER — Other Ambulatory Visit: Payer: Self-pay | Admitting: Family Medicine

## 2020-12-26 ENCOUNTER — Other Ambulatory Visit: Payer: Self-pay

## 2020-12-28 ENCOUNTER — Other Ambulatory Visit: Payer: Self-pay | Admitting: Family Medicine

## 2020-12-28 DIAGNOSIS — I110 Hypertensive heart disease with heart failure: Secondary | ICD-10-CM

## 2020-12-28 MED ORDER — CLONIDINE HCL 0.1 MG PO TABS: 0.1000 mg | ORAL_TABLET | Freq: Two times a day (BID) | ORAL | 6 refills | Status: DC

## 2020-12-28 NOTE — Telephone Encounter (Signed)
Medication: cloNIDine (CATAPRES) 0.1 MG tablet [[id number]]   Has the patient contacted their pharmacy? YES (Agent: If no, request that the patient contact the pharmacy for the refill.) (Agent: If yes, when and what did the pharmacy advise?)  Preferred Pharmacy (with phone number or street name): [place], [address] [place] [address] Phone: [phone] Fax: [phone] Hours: Not open 24 hours    Agent: Please be advised that RX refills may take up to 3 business days. We ask that you follow-up with your pharmacy.

## 2020-12-28 NOTE — Telephone Encounter (Signed)
Requested Prescriptions  Pending Prescriptions Disp Refills  . cloNIDine (CATAPRES) 0.1 MG tablet 60 tablet 6    Sig: Take 1 tablet (0.1 mg total) by mouth 2 (two) times daily.     Cardiovascular:  Alpha-2 Agonists Failed - 12/28/2020 10:19 AM      Failed - Last BP in normal range    BP Readings from Last 1 Encounters:  12/10/20 (!) 154/74         Passed - Last Heart Rate in normal range    Pulse Readings from Last 1 Encounters:  12/10/20 93         Passed - Valid encounter within last 6 months    Recent Outpatient Visits          2 months ago Recurrent major depressive disorder, remission status unspecified ([place])   [place], [place], MD   7 months ago Type 2 diabetes mellitus with 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 gangrene, with long-term current use of insulin ([place])   [place], Charlane Ferretti, MD   7 months ago Colon cancer screening   [place], Ollie, MD   10 months ago Type 2 diabetes mellitus with 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 gangrene, with long-term current use of insulin ([place])   [place], [place], MD   1 year ago Type 2 diabetes mellitus with 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 gangrene, with long-term current use of insulin ([place])   [place] Charlott Rakes, MD

## 2020-12-31 ENCOUNTER — Encounter: Payer: PRIVATE HEALTH INSURANCE | Admitting: Obstetrics

## 2021-01-05 ENCOUNTER — Other Ambulatory Visit: Payer: Self-pay

## 2021-01-05 MED FILL — Insulin Glargine Inj 100 Unit/ML: SUBCUTANEOUS | 28 days supply | Qty: 10 | Fill #0 | Status: CN

## 2021-01-05 MED FILL — Insulin Aspart Inj 100 Unit/ML: SUBCUTANEOUS | 28 days supply | Qty: 10 | Fill #0 | Status: CN

## 2021-01-06 ENCOUNTER — Other Ambulatory Visit: Payer: Self-pay

## 2021-01-13 ENCOUNTER — Other Ambulatory Visit: Payer: Self-pay

## 2021-01-14 ENCOUNTER — Other Ambulatory Visit: Payer: Self-pay

## 2021-01-14 MED FILL — Insulin Glargine Inj 100 Unit/ML: SUBCUTANEOUS | 30 days supply | Qty: 10 | Fill #0 | Status: CN

## 2021-01-14 MED FILL — Insulin Aspart Inj 100 Unit/ML: SUBCUTANEOUS | 37 days supply | Qty: 10 | Fill #0 | Status: CN

## 2021-01-15 ENCOUNTER — Other Ambulatory Visit: Payer: Self-pay

## 2021-01-19 ENCOUNTER — Other Ambulatory Visit: Payer: Self-pay

## 2021-01-21 ENCOUNTER — Other Ambulatory Visit: Payer: Self-pay

## 2021-01-21 ENCOUNTER — Other Ambulatory Visit: Payer: Self-pay | Admitting: Family Medicine

## 2021-01-21 DIAGNOSIS — E1151 Type 2 diabetes mellitus with 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 gangrene: Secondary | ICD-10-CM

## 2021-01-21 DIAGNOSIS — Z794 Long term (current) use of insulin: Secondary | ICD-10-CM

## 2021-01-21 MED FILL — Insulin Aspart Inj Soln 100 Unit/ML: INTRAMUSCULAR | 37 days supply | Qty: 10 | Fill #0 | Status: CN

## 2021-01-21 NOTE — Telephone Encounter (Signed)
Requested medication (s) are due for refill today:  yes  Requested medication (s) are on the active medication list: yes   Last refill:  12/23/2020  Future visit scheduled: no  Notes to clinic:  review for requested change from pharmacy    Requested Prescriptions  Pending Prescriptions Disp Refills   insulin aspart (NOVOLOG) 100 UNIT/ML injection 10 mL 0    Sig: USE PER SLIDING SCALE. MAX OF 9 UNITS 3 TIMES DAILY      Endocrinology:  Diabetes - Insulins Failed - 01/21/2021  9:14 AM      Failed - HBA1C is between 0 and 7.9 and within 180 days    HbA1c, POC (controlled diabetic range)  Date Value Ref Range Status  05/27/2020 10.0 (A) 0.0 - 7.0 % Final          Passed - Valid encounter within last 6 months    Recent Outpatient Visits           3 months ago Recurrent major depressive disorder, remission status unspecified ([place])   [place], [place], MD   7 months ago Type 2 diabetes mellitus with 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 gangrene, with long-term current use of insulin ([place])   [place], Charlane Ferretti, MD   8 months ago Colon cancer screening   [place], [place], MD   11 months ago Type 2 diabetes mellitus with 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 gangrene, with long-term current use of insulin ([place])   [place], [place], MD   1 year ago Type 2 diabetes mellitus with 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 gangrene, with long-term current use of insulin ([place])   [place] [facility] Charlott Rakes, MD

## 2021-01-28 ENCOUNTER — Other Ambulatory Visit: Payer: Self-pay

## 2021-02-02 ENCOUNTER — Other Ambulatory Visit: Payer: Self-pay

## 2021-02-18 ENCOUNTER — Other Ambulatory Visit: Payer: Self-pay | Admitting: Family Medicine

## 2021-02-18 DIAGNOSIS — Z8673 Personal history of transient ischemic attack (TIA), and cerebral infarction without residual deficits: Secondary | ICD-10-CM

## 2021-02-18 DIAGNOSIS — E1151 Type 2 diabetes mellitus with 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 gangrene: Secondary | ICD-10-CM

## 2021-02-18 DIAGNOSIS — I110 Hypertensive heart disease with heart failure: Secondary | ICD-10-CM

## 2021-02-18 NOTE — Telephone Encounter (Signed)
Requested medication (s) are due for refill today:   Requested medication (s) are on the active medication list: No  Last refill:  05/27/20  Future visit scheduled: No  Notes to clinic:  Medication not on list.    Requested Prescriptions  Pending Prescriptions Disp Refills   CARTIA XT 120 MG 24 hr capsule [Pharmacy Med Name: Cartia XT 120 MG Oral Capsule Extended Release 24 Hour] 90 capsule 0    Sig: Take 1 capsule by mouth once daily      Cardiovascular:  Calcium Channel Blockers Failed - 02/18/2021  9:38 AM      Failed - Last BP in normal range    BP Readings from Last 1 Encounters:  12/10/20 (!) 154/74          Passed - Valid encounter within last 6 months    Recent Outpatient Visits           3 months ago Recurrent major depressive disorder, remission status unspecified ([place])   [place], [place], MD   8 months ago Type 2 diabetes mellitus with 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 gangrene, with long-term current use of insulin ([place])   [place], [place], MD   9 months ago Colon cancer screening   [place], [place], MD   1 year ago Type 2 diabetes mellitus with 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 gangrene, with long-term current use of insulin ([place])   [place], Charlane Ferretti, MD   1 year ago Type 2 diabetes mellitus with 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 gangrene, with long-term current use of insulin ([place])   [place], Charlane Ferretti, MD                 Signed Prescriptions Disp Refills   hydrALAZINE (APRESOLINE) 100 MG tablet 180 tablet 0    Sig: TAKE 1 TABLET BY MOUTH TWICE DAILY **DOSE  INCREASE**      Cardiovascular:  Vasodilators Failed - 02/18/2021  9:38 AM      Failed - HCT in normal range and within 360 days    HCT  Date Value Ref Range Status  09/25/2019 39.1  36.0 - 46.0 % Final   Hematocrit  Date Value Ref Range Status  01/16/2019 37.4 34.0 - 46.6 % Final          Failed - HGB in normal range and within 360 days    Hemoglobin  Date Value Ref Range Status  09/25/2019 12.0 12.0 - 15.0 g/dL Final  01/16/2019 12.1 11.1 - 15.9 g/dL Final          Failed - RBC in normal range and within 360 days    RBC  Date Value Ref Range Status  09/25/2019 4.42 3.87 - 5.11 MIL/uL Final          Failed - WBC in normal range and within 360 days    WBC  Date Value Ref Range Status  09/25/2019 7.7 4.0 - 10.5 K/uL Final          Failed - PLT in normal range and within 360 days    Platelets  Date Value Ref Range Status  09/25/2019 261 150 - 400 K/uL Final  01/16/2019 286 150 - 450 x10E3/uL Final          Failed - Last BP in normal range    BP Readings from Last 1 Encounters:  12/10/20 Marland Kitchen)  154/74          Passed - Valid encounter within last 12 months    Recent Outpatient Visits           3 months ago Recurrent major depressive disorder, remission status unspecified ([place])   [place], [place], MD   8 months ago Type 2 diabetes mellitus with 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 gangrene, with long-term current use of insulin ([place])   [place], [place], MD   9 months ago Colon cancer screening   [place], Charlane Ferretti, MD   1 year ago Type 2 diabetes mellitus with 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 gangrene, with long-term current use of insulin ([place])   [place], Charlane Ferretti, MD   1 year ago Type 2 diabetes mellitus with 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 gangrene, with long-term current use of insulin ([place])   [place], Charlane Ferretti, MD                  clopidogrel (PLAVIX) 75 MG tablet 90 tablet 0    Sig: Take 1 tablet by mouth once daily       Hematology: Antiplatelets - clopidogrel Failed - 02/18/2021  9:38 AM      Failed - Evaluate AST, ALT within 2 months of therapy initiation.      Failed - HCT in normal range and within 180 days    HCT  Date Value Ref Range Status  09/25/2019 39.1 36.0 - 46.0 % Final   Hematocrit  Date Value Ref Range Status  01/16/2019 37.4 34.0 - 46.6 % Final          Failed - HGB in normal range and within 180 days    Hemoglobin  Date Value Ref Range Status  09/25/2019 12.0 12.0 - 15.0 g/dL Final  01/16/2019 12.1 11.1 - 15.9 g/dL Final          Failed - PLT in normal range and within 180 days    Platelets  Date Value Ref Range Status  09/25/2019 261 150 - 400 K/uL Final  01/16/2019 286 150 - 450 x10E3/uL Final          Passed - ALT in normal range and within 360 days    ALT  Date Value Ref Range Status  05/27/2020 11 0 - 32 IU/L Final          Passed - AST in normal range and within 360 days    AST  Date Value Ref Range Status  05/27/2020 14 0 - 40 IU/L Final          Passed - Valid encounter within last 6 months    Recent Outpatient Visits           3 months ago Recurrent major depressive disorder, remission status unspecified ([place])   [place], [place], MD   8 months ago Type 2 diabetes mellitus with 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 gangrene, with long-term current use of insulin ([place])   [place], Charlane Ferretti, MD   9 months ago Colon cancer screening   [place], [place], MD   1 year ago Type 2 diabetes mellitus with 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 gangrene, with long-term current use of insulin ([place])   [facility] Charlott Rakes, MD   1 year  ago Type 2 diabetes mellitus with 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 gangrene, with long-term current use of insulin ([place])   [place], [place], MD                  potassium chloride SA (KLOR-CON) 20 MEQ tablet 90 tablet 0    Sig: Take 1 tablet by mouth once daily      Endocrinology:  Minerals - Potassium Supplementation Failed - 02/18/2021  9:38 AM      Failed - Cr in normal range and within 360 days    Creatinine, Ser  Date Value Ref Range Status  05/27/2020 1.18 (H) 0.57 - 1.00 mg/dL Final   Creatinine, Urine  Date Value Ref Range Status  05/22/2017 18.63 mg/dL Final    Comment:    Performed at [facility], [place] [address], [place], [place] [address]          Passed - K in normal range and within 360 days    Potassium  Date Value Ref Range Status  05/27/2020 3.5 3.5 - 5.2 mmol/L Final          Passed - Valid encounter within last 12 months    Recent Outpatient Visits           3 months ago Recurrent major depressive disorder, remission status unspecified ([place])   [place], Charlane Ferretti, MD   8 months ago Type 2 diabetes mellitus with 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 gangrene, with long-term current use of insulin ([place])   [place], [place], MD   9 months ago Colon cancer screening   [place], Charlane Ferretti, MD   1 year ago Type 2 diabetes mellitus with 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 gangrene, with long-term current use of insulin ([place])   [place], Charlane Ferretti, MD   1 year ago Type 2 diabetes mellitus with 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 gangrene, with long-term current use of insulin ([place])   [place], [place], MD                  hydrochlorothiazide (HYDRODIURIL) 25 MG tablet 90 tablet 0    Sig: TAKE 1 TABLET BY MOUTH ONCE DAILY **DISCONTINUE  12.5  MG  TABLET**      Cardiovascular: Diuretics - Thiazide Failed - 02/18/2021  9:38 AM      Failed - Cr in normal range and  within 360 days    Creatinine, Ser  Date Value Ref Range Status  05/27/2020 1.18 (H) 0.57 - 1.00 mg/dL Final   Creatinine, Urine  Date Value Ref Range Status  05/22/2017 18.63 mg/dL Final    Comment:    Performed at [facility], [place] [address], [place], [place] [address]          Failed - Last BP in normal range    BP Readings from Last 1 Encounters:  12/10/20 (!) 154/74          Passed - Ca in normal range and within 360 days    Calcium  Date Value Ref Range Status  05/27/2020 9.1 8.7 - 10.3 mg/dL Final   Calcium, Ion  Date Value Ref Range Status  09/21/2016 1.13 (L) 1.15 - 1.40 mmol/L Final          Passed - K in normal range and within 360 days  Potassium  Date Value Ref Range Status  05/27/2020 3.5 3.5 - 5.2 mmol/L Final          Passed - Na in normal range and within 360 days    Sodium  Date Value Ref Range Status  05/27/2020 139 134 - 144 mmol/L Final          Passed - Valid encounter within last 6 months    Recent Outpatient Visits           3 months ago Recurrent major depressive disorder, remission status unspecified ([place])   [place], [place]ta, MD   8 months ago Type 2 diabetes mellitus with 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 gangrene, with long-term current use of insulin ([place])   [place], [place], MD   9 months ago Colon cancer screening   [place], Charlane Ferretti, MD   1 year ago Type 2 diabetes mellitus with 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 gangrene, with long-term current use of insulin ([place])   [place], Charlane Ferretti, MD   1 year ago Type 2 diabetes mellitus with 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 gangrene, with long-term current use of insulin ([place])   [place], McKenzie, MD                  rosuvastatin (CRESTOR) 40 MG tablet 90  tablet 0    Sig: TAKE 1 TABLET BY MOUTH ONCE DAILY AT  6  PM      Cardiovascular:  Antilipid - Statins Passed - 02/18/2021  9:38 AM      Passed - Total Cholesterol in normal range and within 360 days    Cholesterol, Total  Date Value Ref Range Status  05/27/2020 138 100 - 199 mg/dL Final          Passed - LDL in normal range and within 360 days    LDL Chol Calc (NIH)  Date Value Ref Range Status  05/27/2020 69 0 - 99 mg/dL Final          Passed - HDL in normal range and within 360 days    HDL  Date Value Ref Range Status  05/27/2020 47 >39 mg/dL Final          Passed - Triglycerides in normal range and within 360 days    Triglycerides  Date Value Ref Range Status  05/27/2020 120 0 - 149 mg/dL Final          Passed - Patient is not pregnant      Passed - Valid encounter within last 12 months    Recent Outpatient Visits           3 months ago Recurrent major depressive disorder, remission status unspecified ([place])   [employer], [place], MD   8 months ago Type 2 diabetes mellitus with 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 gangrene, with long-term current use of insulin ([place])   [place], Charlane Ferretti, MD   9 months ago Colon cancer screening   [place], [place], MD   1 year ago Type 2 diabetes mellitus with 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 gangrene, with long-term current use of insulin ([place])   [place], Charlane Ferretti, MD   1 year ago Type 2 diabetes mellitus with 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 gangrene, with long-term current use  of insulin [facility])   [place] [facility] Charlott Rakes, MD

## 2021-03-04 ENCOUNTER — Other Ambulatory Visit: Payer: Self-pay | Admitting: Pharmacist

## 2021-03-04 ENCOUNTER — Other Ambulatory Visit: Payer: Self-pay | Admitting: Family Medicine

## 2021-03-04 ENCOUNTER — Other Ambulatory Visit: Payer: Self-pay

## 2021-03-04 DIAGNOSIS — Z794 Long term (current) use of insulin: Secondary | ICD-10-CM

## 2021-03-04 MED ORDER — INSULIN GLARGINE-YFGN 100 UNIT/ML ~~LOC~~ SOLN
20.0000 [IU] | Freq: Every day | SUBCUTANEOUS | 0 refills | Status: DC
  Filled 2021-03-04 (×2): qty 10, 28d supply, fill #0

## 2021-03-04 MED FILL — Insulin Aspart Inj Soln 100 Unit/ML: INTRAMUSCULAR | 28 days supply | Qty: 10 | Fill #0 | Status: AC

## 2021-03-04 MED FILL — Insulin Glargine Inj 100 Unit/ML: SUBCUTANEOUS | 30 days supply | Qty: 10 | Fill #0 | Status: CN

## 2021-03-05 ENCOUNTER — Other Ambulatory Visit: Payer: Self-pay

## 2021-03-15 ENCOUNTER — Other Ambulatory Visit: Payer: Self-pay

## 2021-03-30 ENCOUNTER — Other Ambulatory Visit: Payer: Self-pay | Admitting: Family Medicine

## 2021-03-30 DIAGNOSIS — I110 Hypertensive heart disease with heart failure: Secondary | ICD-10-CM

## 2021-03-31 NOTE — Telephone Encounter (Signed)
   Notes to clinic:  Courtesy refill given and appt has not been scheduled   Requested Prescriptions  Pending Prescriptions Disp Refills   CARTIA XT 120 MG 24 hr capsule [Pharmacy Med Name: Cartia XT 120 MG Oral Capsule Extended Release 24 Hour] 30 capsule 0    Sig: TAKE 1 CAPSULE BY MOUTH ONCE DAILY **MUST HAVE OFFICE VISIT FOR REFILLS**      Cardiovascular:  Calcium Channel Blockers Failed - 03/30/2021  5:20 PM      Failed - Last BP in normal range    BP Readings from Last 1 Encounters:  12/10/20 (!) 154/74          Passed - Valid encounter within last 6 months    Recent Outpatient Visits           5 months ago Recurrent major depressive disorder, remission status unspecified ([place])   [place], [place], MD   10 months ago Type 2 diabetes mellitus with 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 gangrene, with long-term current use of insulin ([place])   [place], Charlane Ferretti, MD   10 months ago Colon cancer screening   [place], [place], MD   1 year ago Type 2 diabetes mellitus with 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 gangrene, with long-term current use of insulin ([place])   [place], [place], MD   1 year ago Type 2 diabetes mellitus with 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 gangrene, with long-term current use of insulin ([place])   [facility] Charlott Rakes, MD

## 2021-05-06 ENCOUNTER — Other Ambulatory Visit: Payer: Self-pay | Admitting: Family Medicine

## 2021-05-06 ENCOUNTER — Ambulatory Visit: Payer: Self-pay | Admitting: *Deleted

## 2021-05-06 DIAGNOSIS — I5032 Chronic diastolic (congestive) heart failure: Secondary | ICD-10-CM

## 2021-05-06 DIAGNOSIS — E1121 Type 2 diabetes mellitus with diabetic nephropathy: Secondary | ICD-10-CM

## 2021-05-06 DIAGNOSIS — I110 Hypertensive heart disease with heart failure: Secondary | ICD-10-CM

## 2021-05-06 NOTE — Telephone Encounter (Signed)
Medication Refill - Medication: albuterol (VENTOLIN HFA) 108 (90 Base) MCG/ACT inhaler CARTIA XT 120 MG 24 hr capsule, gabapentin (NEURONTIN) 300 MG capsule  Has the patient contacted their pharmacy? Yes.   (Agent: If no, request that the patient contact the pharmacy for the refill.) (Agent: If yes, when and what did the pharmacy advise?)  Preferred Pharmacy (with phone number or street name):  [place], [address] Phone:  [phone]  Fax:  [phone]      Agent: Please be advised that RX refills may take up to 3 business days. We ask that you follow-up with your pharmacy.

## 2021-05-06 NOTE — Telephone Encounter (Signed)
Patient c/o abdominal bloating especially at the upper right area under the ribs.  She has suffered from GI issues for some time and seem to be getting worse. Noticed dark blood in stools along with mucus and pus. Feels the need to have BM but usually cannot. Yesterday's loose small stool had blood in it. Eating causes bloating and nausea. Fatigued all the time.Denies CP/SOB no reported dizziness/fever. She has not taken any OTC medication to see if it would help.Missed a colonoscopy about 3 months ago. Encouraged to call now and reschedule-if she needs updated referral to call back. Checked in [facility] 2 days ago and did not wait to be seen. Left and went to UC who wanted a co-pay she could not pay so was not seen. Urged patient to go to [facility] and stay until seen. Also, provided Mobile Unit information to her. Patient stated she would call GI now to reschedule appointment. Again, advised ED for evaluation.    Reason for Disposition  Black or tarry bowel movements (Exception: chronic-unchanged black-grey bowel movements AND is taking iron pills or Pepto-bismol)  Answer Assessment - Initial Assessment Questions 1. LOCATION: "Where does it hurt?"      Under the right sided ribs. 2. RADIATION: "Does the pain shoot anywhere else?" (e.g., chest, back)     No 3. ONSET: "When did the pain begin?" (e.g., minutes, hours or days ago)      Worsening over the last week but has experienced over the last few months. 4. SUDDEN: "Gradual or sudden onset?"     Gradual over months 5. PATTERN "Does the pain come and go, or is it constant?"    - If constant: "Is it getting better, staying the same, or worsening?"      (Note: Constant means the pain never goes away completely; most serious pain is constant and it progresses)     - If intermittent: "How long does it last?" "Do you have pain now?"     (Note: Intermittent means the pain goes away completely between bouts)     Pain is intermittent but more so  lately 6. SEVERITY: "How bad is the pain?"  (e.g., Scale 1-10; mild, moderate, or severe)    - MILD (1-3): doesn't interfere with normal activities, abdomen soft and not tender to touch     - MODERATE (4-7): interferes with normal activities or awakens from sleep, abdomen tender to touch     - SEVERE (8-10): excruciating pain, doubled over, unable to do any normal activities       More of bloating and feeling the need to have BM and can't 7. RECURRENT SYMPTOM: "Have you ever had this type of stomach pain before?" If Yes, ask: "When was the last time?" and "What happened that time?"      Dealing with stomach issues for a long time 8. AGGRAVATING FACTORS: "Does anything seem to cause this pain?" (e.g., foods, stress, alcohol)     Food causes increases nausea and bloating 9. CARDIAC SYMPTOMS: "Do you have any of the following symptoms: chest pain, difficulty breathing, sweating, nausea?"     nausea 10. OTHER SYMPTOMS: "Do you have any other symptoms?" (e.g., back pain, diarrhea, fever, urination pain, vomiting)       Diarrhea   11. PREGNANCY: "Is there any chance you are pregnant?" "When was your last menstrual period?"       na  Protocols used: Abdominal Pain - Upper-A-AH

## 2021-05-06 NOTE — Telephone Encounter (Signed)
Nikki-from office is calling for triage- patient is having symptoms that need to be addressed before next appointment.  Patient states she is having abdominal pain and GI bleed. Attempted to call patient- no answer. Left message for patient to return call regarding her symptoms- may speak with any nurse.

## 2021-05-07 MED ORDER — DILTIAZEM HCL ER COATED BEADS 120 MG PO CP24: 120.0000 mg | ORAL_CAPSULE | Freq: Every day | ORAL | 2 refills | Status: DC

## 2021-05-07 MED ORDER — ALBUTEROL SULFATE HFA 108 (90 BASE) MCG/ACT IN AERS: 1.0000 | INHALATION_SPRAY | Freq: Four times a day (QID) | RESPIRATORY_TRACT | 2 refills | Status: AC | PRN

## 2021-05-07 NOTE — Telephone Encounter (Signed)
Duplicate triage encounter, already addressed.

## 2021-05-07 NOTE — Telephone Encounter (Signed)
}  Notes to clinic:  Patient has been scheduled for appt on 07/27/2021 Review for enough refills until that time    Requested Prescriptions  Pending Prescriptions Disp Refills   gabapentin (NEURONTIN) 300 MG capsule 90 capsule 6    Sig: Take 1 capsule (300 mg total) by mouth 3 (three) times daily.     Neurology: Anticonvulsants - gabapentin Passed - 05/07/2021 12:23 PM      Passed - Valid encounter within last 12 months    Recent Outpatient Visits           6 months ago Recurrent major depressive disorder, remission status unspecified ([place])   [place], Grover, MD   11 months ago Type 2 diabetes mellitus with 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 gangrene, with long-term current use of insulin ([place])   [place], Charlane Ferretti, MD   12 months ago Colon cancer screening   [place], Charlane Ferretti, MD   1 year ago Type 2 diabetes mellitus with 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 gangrene, with long-term current use of insulin ([place])   [place], Charlane Ferretti, MD   1 year ago Type 2 diabetes mellitus with 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 gangrene, with long-term current use of insulin ([place])   [place], Charlane Ferretti, MD       Future Appointments             In 2 months Charlott Rakes, MD [place]             diltiazem (CARTIA XT) 120 MG 24 hr capsule 30 capsule 0    Sig: Take 1 capsule (120 mg total) by mouth daily.     Cardiovascular:  Calcium Channel Blockers Failed - 05/07/2021 12:23 PM      Failed - Last BP in normal range    BP Readings from Last 1 Encounters:  12/10/20 (!) 154/74          Failed - Valid encounter within last 6 months    Recent Outpatient Visits           6 months ago Recurrent major depressive disorder, remission status unspecified  ([place])   [place], [place], MD   11 months ago Type 2 diabetes mellitus with 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 gangrene, with long-term current use of insulin ([place])   [place], Charlane Ferretti, MD   12 months ago Colon cancer screening   [place], [place], MD   1 year ago Type 2 diabetes mellitus with 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 gangrene, with long-term current use of insulin ([place])   [place], [place], MD   1 year ago Type 2 diabetes mellitus with 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 gangrene, with long-term current use of insulin ([place])   [place], Charlane Ferretti, MD       Future Appointments             In 2 months Charlott Rakes, MD [place]             albuterol (VENTOLIN HFA) 108 (90 Base) MCG/ACT inhaler 6.7 g 2    Sig: Inhale 1-2 puffs into the lungs every 6 (six) hours as needed for wheezing or shortness of breath.  Pulmonology:  Beta Agonists Failed - 05/07/2021 12:23 PM      Failed - One inhaler should last at least one month. If the patient is requesting refills earlier, contact the patient to check for uncontrolled symptoms.      Passed - Valid encounter within last 12 months    Recent Outpatient Visits           6 months ago Recurrent major depressive disorder, remission status unspecified ([place])   [place], [place], MD   11 months ago Type 2 diabetes mellitus with 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 gangrene, with long-term current use of insulin ([place])   [place], Charlane Ferretti, MD   12 months ago Colon cancer screening   [place], Charlane Ferretti, MD   1 year ago Type 2 diabetes mellitus with diabetic peripheral angiopathy  without gangrene, with long-term current use of insulin ([place])   [place], Charlane Ferretti, MD   1 year ago Type 2 diabetes mellitus with 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 gangrene, with long-term current use of insulin ([place])   [place], Enobong, MD       Future Appointments             In 2 months Charlott Rakes, MD [place]

## 2021-05-08 ENCOUNTER — Other Ambulatory Visit: Payer: Self-pay | Admitting: Family Medicine

## 2021-05-08 DIAGNOSIS — E1121 Type 2 diabetes mellitus with diabetic nephropathy: Secondary | ICD-10-CM

## 2021-05-08 NOTE — Telephone Encounter (Signed)
Requested Prescriptions  Pending Prescriptions Disp Refills  . gabapentin (NEURONTIN) 300 MG capsule [Pharmacy Med Name: Gabapentin 300 MG Oral Capsule] 90 capsule 0    Sig: TAKE 1 CAPSULE BY MOUTH THREE TIMES DAILY     Neurology: Anticonvulsants - gabapentin Passed - 05/08/2021  1:29 PM      Passed - Valid encounter within last 12 months    Recent Outpatient Visits          6 months ago Recurrent major depressive disorder, remission status unspecified ([place])   [place], [place], MD   11 months ago Type 2 diabetes mellitus with 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 gangrene, with long-term current use of insulin ([place])   [place], Charlane Ferretti, MD   12 months ago Colon cancer screening   [place], [place], MD   1 year ago Type 2 diabetes mellitus with 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 gangrene, with long-term current use of insulin ([place])   [place], [place], MD   1 year ago Type 2 diabetes mellitus with 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 gangrene, with long-term current use of insulin ([place])   [place], Enobong, MD      Future Appointments            In 2 months Charlott Rakes, MD [place]

## 2021-05-11 MED ORDER — GABAPENTIN 300 MG PO CAPS: 300.0000 mg | ORAL_CAPSULE | Freq: Three times a day (TID) | ORAL | 1 refills | Status: DC

## 2021-05-14 ENCOUNTER — Other Ambulatory Visit: Payer: Self-pay | Admitting: Family Medicine

## 2021-05-14 DIAGNOSIS — Z794 Long term (current) use of insulin: Secondary | ICD-10-CM

## 2021-05-14 NOTE — Telephone Encounter (Signed)
Refill given, appointment 07/27/21.

## 2021-05-26 ENCOUNTER — Other Ambulatory Visit: Payer: Self-pay | Admitting: Family Medicine

## 2021-05-26 DIAGNOSIS — I110 Hypertensive heart disease with heart failure: Secondary | ICD-10-CM

## 2021-05-26 DIAGNOSIS — I5032 Chronic diastolic (congestive) heart failure: Secondary | ICD-10-CM

## 2021-05-26 NOTE — Telephone Encounter (Signed)
Requested medications are due for refill today.  yes  Requested medications are on the active medications list.  yes  Last refill. 02/18/2021  Future visit scheduled.   Yes 07/2021  Notes to clinic.  Pt was last seen in office 05/27/2021. Per note from that visit Pt was supposed to return 08/26/2020 for follow up.

## 2021-06-08 ENCOUNTER — Other Ambulatory Visit: Payer: Self-pay | Admitting: Family Medicine

## 2021-06-08 ENCOUNTER — Other Ambulatory Visit: Payer: Self-pay

## 2021-06-08 DIAGNOSIS — Z794 Long term (current) use of insulin: Secondary | ICD-10-CM

## 2021-06-08 DIAGNOSIS — E1151 Type 2 diabetes mellitus with 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 gangrene: Secondary | ICD-10-CM

## 2021-06-08 NOTE — Telephone Encounter (Signed)
Requested medication (s) are due for refill today:   Yes  Requested medication (s) are on the active medication list:   Yes  Future visit scheduled:   Yes 07/27/2021   Last ordered: 12/23/2020-12/23/2021 10 ml, 0 refills sliding scale  Returned due to failed protocol labs due.   Has an appt coming up.  Her dosage based on sliding scale.   Requested Prescriptions  Pending Prescriptions Disp Refills   insulin aspart (NOVOLOG) 100 UNIT/ML injection 10 mL 0    Sig: USE PER SLIDING SCALE. MAX OF 9 UNITS 3 TIMES DAILY     Endocrinology:  Diabetes - Insulins Failed - 06/08/2021  9:49 AM      Failed - HBA1C is between 0 and 7.9 and within 180 days    HbA1c, POC (controlled diabetic range)  Date Value Ref Range Status  05/27/2020 10.0 (A) 0.0 - 7.0 % Final          Failed - Valid encounter within last 6 months    Recent Outpatient Visits           7 months ago Recurrent major depressive disorder, remission status unspecified ([place])   [place], [place], MD   1 year ago Type 2 diabetes mellitus with 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 gangrene, with long-term current use of insulin ([place])   [facility] [place], Charlane Ferretti, MD   1 year ago Colon cancer screening   [place], [place], MD   1 year ago Type 2 diabetes mellitus with 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 gangrene, with long-term current use of insulin ([place])   [place], [place], MD   1 year ago Type 2 diabetes mellitus with 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 gangrene, with long-term current use of insulin ([place])   [place], Charlane Ferretti, MD       Future Appointments             In 1 month Charlott Rakes, MD [place]             insulin glargine-yfgn (SEMGLEE, YFGN,) 100 UNIT/ML injection 10 mL 0    Sig:  Inject 0.2 mLs (20 Units total) into the skin daily.     Off-Protocol Failed - 06/08/2021  9:49 AM      Failed - Medication not assigned to a protocol, review manually.      Passed - Valid encounter within last 12 months    Recent Outpatient Visits           7 months ago Recurrent major depressive disorder, remission status unspecified ([place])   [place], [place], MD   1 year ago Type 2 diabetes mellitus with 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 gangrene, with long-term current use of insulin ([place])   [facility] [place], Charlane Ferretti, MD   1 year ago Colon cancer screening   [place], Charlane Ferretti, MD   1 year ago Type 2 diabetes mellitus with 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 gangrene, with long-term current use of insulin ([place])   [place], Charlane Ferretti, MD   1 year ago Type 2 diabetes mellitus with 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 gangrene, with long-term current use of insulin ([place])   [place], Enobong, MD       Future Appointments  In 1 month Charlott Rakes, MD [place]

## 2021-06-10 ENCOUNTER — Other Ambulatory Visit: Payer: Self-pay

## 2021-06-10 MED ORDER — INSULIN ASPART 100 UNIT/ML IJ SOLN
INTRAMUSCULAR | 0 refills | Status: DC
  Filled 2021-06-10: qty 10, 30d supply, fill #0
  Filled 2021-06-10: qty 10, 28d supply, fill #0
  Filled 2021-06-10: qty 10, 37d supply, fill #0
  Filled 2021-06-22: qty 10, 28d supply, fill #0

## 2021-06-10 MED ORDER — INSULIN GLARGINE-YFGN 100 UNIT/ML ~~LOC~~ SOLN
20.0000 [IU] | Freq: Every day | SUBCUTANEOUS | 0 refills | Status: DC
  Filled 2021-06-10 (×3): qty 10, 28d supply, fill #0
  Filled 2021-06-10: qty 10, 50d supply, fill #0
  Filled 2021-06-22: qty 10, 28d supply, fill #0

## 2021-06-11 ENCOUNTER — Other Ambulatory Visit: Payer: Self-pay

## 2021-06-17 ENCOUNTER — Other Ambulatory Visit: Payer: Self-pay

## 2021-06-18 ENCOUNTER — Other Ambulatory Visit: Payer: Self-pay

## 2021-06-22 ENCOUNTER — Other Ambulatory Visit: Payer: Self-pay

## 2021-06-23 ENCOUNTER — Other Ambulatory Visit: Payer: Self-pay

## 2021-06-28 ENCOUNTER — Other Ambulatory Visit: Payer: Self-pay | Admitting: Family Medicine

## 2021-06-28 DIAGNOSIS — F339 Major depressive disorder, recurrent, unspecified: Secondary | ICD-10-CM

## 2021-06-28 DIAGNOSIS — I110 Hypertensive heart disease with heart failure: Secondary | ICD-10-CM

## 2021-06-28 DIAGNOSIS — Z8673 Personal history of transient ischemic attack (TIA), and cerebral infarction without residual deficits: Secondary | ICD-10-CM

## 2021-06-29 NOTE — Telephone Encounter (Signed)
Requested medications are due for refill today NO  Requested medications are on the active medication list NO  Last refill 05/26/21  Last visit 05/27/20  Future visit scheduled 07/27/21  Notes to clinic Not on current med list, please assess.

## 2021-07-07 ENCOUNTER — Telehealth: Payer: Self-pay | Admitting: *Deleted

## 2021-07-07 NOTE — Telephone Encounter (Signed)
Copied from [place] [id number]. Topic: Quick Communication - Rx Refill/Question >> Jun 29, 2021  2:12 PM Pawlus, Brayton Layman A wrote: Pt wanted to know why escitalopram (LEXAPRO) 20 MG tablet [[id number]] was DISCONTINUED, pt stated she has still been taking this medication.

## 2021-07-08 NOTE — Telephone Encounter (Signed)
This was switched to Cymbalta by Dr. Margarita Rana in January d/t uncontrolled MDD. She needs a visit to discuss this with her PCP if she has been continuing to take the Lexapro. She has a visit upcoming on 07/27/2021. Will route this information to Dr. Margarita Rana so she is aware.

## 2021-07-08 NOTE — Telephone Encounter (Signed)
Please advise her that she should not be taking both Cymbalta and Lexapro as they are both in the same class of medications and it needs to be one or the other.  I will discuss this further at her upcoming appointment.

## 2021-07-08 NOTE — Telephone Encounter (Signed)
Pt states that she is taking only the Cymbalta at this time, she states that the Lexapro works better and she would like to take that medication.

## 2021-07-09 NOTE — Telephone Encounter (Signed)
I will need to discuss this at her office visit.  Her chart reveals she is allergic to Lexapro; it states "made patient feel crazy".  Change will only be made at her visit in 2 weeks.

## 2021-07-09 NOTE — Telephone Encounter (Signed)
Pt was called and informed of PCP response. 

## 2021-07-27 ENCOUNTER — Ambulatory Visit: Payer: PRIVATE HEALTH INSURANCE | Attending: Family Medicine | Admitting: Family Medicine

## 2021-07-27 ENCOUNTER — Other Ambulatory Visit: Payer: Self-pay

## 2021-07-27 ENCOUNTER — Encounter: Payer: Self-pay | Admitting: Family Medicine

## 2021-07-27 VITALS — BP 135/79 | HR 84 | Ht 65.0 in | Wt 162.5 lb

## 2021-07-27 DIAGNOSIS — F339 Major depressive disorder, recurrent, unspecified: Secondary | ICD-10-CM

## 2021-07-27 DIAGNOSIS — E1121 Type 2 diabetes mellitus with diabetic nephropathy: Secondary | ICD-10-CM | POA: Diagnosis not present

## 2021-07-27 DIAGNOSIS — E1151 Type 2 diabetes mellitus with 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 gangrene: Secondary | ICD-10-CM | POA: Diagnosis not present

## 2021-07-27 DIAGNOSIS — I110 Hypertensive heart disease with heart failure: Secondary | ICD-10-CM

## 2021-07-27 DIAGNOSIS — Z8673 Personal history of transient ischemic attack (TIA), and cerebral infarction without residual deficits: Secondary | ICD-10-CM

## 2021-07-27 DIAGNOSIS — Z794 Long term (current) use of insulin: Secondary | ICD-10-CM

## 2021-07-27 DIAGNOSIS — F17210 Nicotine dependence, cigarettes, uncomplicated: Secondary | ICD-10-CM

## 2021-07-27 DIAGNOSIS — I5032 Chronic diastolic (congestive) heart failure: Secondary | ICD-10-CM

## 2021-07-27 DIAGNOSIS — Z720 Tobacco use: Secondary | ICD-10-CM

## 2021-07-27 LAB — GLUCOSE, POCT (MANUAL RESULT ENTRY): POC Glucose: 259 mg/dl — AB (ref 70–99)

## 2021-07-27 LAB — POCT GLYCOSYLATED HEMOGLOBIN (HGB A1C): Hemoglobin A1C: 10.5 % — AB (ref 4.0–5.6)

## 2021-07-27 MED ORDER — CLOPIDOGREL BISULFATE 75 MG PO TABS: 75.0000 mg | ORAL_TABLET | Freq: Every day | ORAL | 1 refills | Status: DC

## 2021-07-27 MED ORDER — GABAPENTIN 300 MG PO CAPS: 600.0000 mg | ORAL_CAPSULE | Freq: Two times a day (BID) | ORAL | 6 refills | Status: DC

## 2021-07-27 MED ORDER — INSULIN GLARGINE-YFGN 100 UNIT/ML ~~LOC~~ SOLN: 37.0000 [IU] | Freq: Every day | SUBCUTANEOUS | 6 refills | Status: DC

## 2021-07-27 MED ORDER — OMEPRAZOLE 20 MG PO CPDR
20.0000 mg | DELAYED_RELEASE_CAPSULE | Freq: Two times a day (BID) | ORAL | 6 refills | Status: AC
Start: 2021-07-27 — End: ?

## 2021-07-27 MED ORDER — POTASSIUM CHLORIDE CRYS ER 20 MEQ PO TBCR: EXTENDED_RELEASE_TABLET | ORAL | 1 refills | Status: DC

## 2021-07-27 MED ORDER — AMOXICILLIN-POT CLAVULANATE 875-125 MG PO TABS: 1.0000 | ORAL_TABLET | Freq: Two times a day (BID) | ORAL | 0 refills | Status: DC

## 2021-07-27 MED ORDER — NICOTINE 7 MG/24HR TD PT24: 7.0000 mg | MEDICATED_PATCH | Freq: Every day | TRANSDERMAL | 3 refills | Status: AC

## 2021-07-27 MED ORDER — ESCITALOPRAM OXALATE 20 MG PO TABS: 20.0000 mg | ORAL_TABLET | Freq: Every day | ORAL | 6 refills | Status: DC

## 2021-07-27 MED ORDER — ROSUVASTATIN CALCIUM 40 MG PO TABS: ORAL_TABLET | ORAL | 1 refills | Status: AC

## 2021-07-27 MED ORDER — INSULIN ASPART 100 UNIT/ML IJ SOLN: INTRAMUSCULAR | 3 refills | Status: DC

## 2021-07-27 MED ORDER — DILTIAZEM HCL ER COATED BEADS 120 MG PO CP24: 120.0000 mg | ORAL_CAPSULE | Freq: Every day | ORAL | 6 refills | Status: DC

## 2021-07-27 NOTE — Patient Instructions (Signed)
Diabetes Mellitus and Nutrition, Adult When you have diabetes, or diabetes mellitus, it is very important to have healthy eating habits because your blood sugar (glucose) levels are greatly affected by what you eat and drink. Eating healthy foods in the right amounts, at about the same times every day, can help you:  Control your blood glucose.  Lower your risk of heart disease.  Improve your blood pressure.  Reach or maintain a healthy weight. What can affect my meal plan? Every person with diabetes is different, and each person has different needs for a meal plan. Your health care provider may recommend that you work with a dietitian to make a meal plan that is best for you. Your meal plan may vary depending on factors such as:  The calories you need.  The medicines you take.  Your weight.  Your blood glucose, blood pressure, and cholesterol levels.  Your activity level.  Other health conditions you have, such as heart or kidney disease. How do carbohydrates affect me? Carbohydrates, also called carbs, affect your blood glucose level more than any other type of food. Eating carbs naturally raises the amount of glucose in your blood. Carb counting is a method for keeping track of how many carbs you eat. Counting carbs is important to keep your blood glucose at a healthy level, especially if you use insulin or take certain oral diabetes medicines. It is important to know how many carbs you can safely have in each meal. This is different for every person. Your dietitian can help you calculate how many carbs you should have at each meal and for each snack. How does alcohol affect me? Alcohol can cause a sudden decrease in blood glucose (hypoglycemia), especially if you use insulin or take certain oral diabetes medicines. Hypoglycemia can be a life-threatening condition. Symptoms of hypoglycemia, such as sleepiness, dizziness, and confusion, are similar to symptoms of having too much  alcohol.  Do not drink alcohol if: ? Your health care provider tells you not to drink. ? You are pregnant, may be pregnant, or are planning to become pregnant.  If you drink alcohol: ? Do not drink on an empty stomach. ? Limit how much you use to:  0-1 drink a day for women.  0-2 drinks a day for men. ? Be aware of how much alcohol is in your drink. In the [place] one drink equals one 12 oz bottle of beer (355 mL), one 5 oz glass of wine (148 mL), or one 1 oz glass of hard liquor (44 mL). ? Keep yourself hydrated with water, diet soda, or unsweetened iced tea.  Keep in mind that regular soda, juice, and other mixers may contain a lot of sugar and must be counted as carbs. What are tips for following this plan? Reading food labels  Start by checking the serving size on the "Nutrition Facts" label of packaged foods and drinks. The amount of calories, carbs, fats, and other nutrients listed on the label is based on one serving of the item. Many items contain more than one serving per package.  Check the total grams (g) of carbs in one serving. You can calculate the number of servings of carbs in one serving by dividing the total carbs by 15. For example, if a food has 30 g of total carbs per serving, it would be equal to 2 servings of carbs.  Check the number of grams (g) of saturated fats and trans fats in one serving. Choose foods that have   a low amount or none of these fats.  Check the number of milligrams (mg) of salt (sodium) in one serving. Most people should limit total sodium intake to less than 2,300 mg per day.  Always check the nutrition information of foods labeled as "low-fat" or "nonfat." These foods may be higher in added sugar or refined carbs and should be avoided.  Talk to your dietitian to identify your daily goals for nutrients listed on the label. Shopping  Avoid buying canned, pre-made, or processed foods. These foods tend to be high in fat, sodium, and added  sugar.  Shop around the outside edge of the grocery store. This is where you will most often find fresh fruits and vegetables, bulk grains, fresh meats, and fresh dairy. Cooking  Use low-heat cooking methods, such as baking, instead of high-heat cooking methods like deep frying.  Cook using healthy oils, such as olive, canola, or sunflower oil.  Avoid cooking with butter, cream, or high-fat meats. Meal planning  Eat meals and snacks regularly, preferably at the same times every day. Avoid going long periods of time without eating.  Eat foods that are high in fiber, such as fresh fruits, vegetables, beans, and whole grains. Talk with your dietitian about how many servings of carbs you can eat at each meal.  Eat 4-6 oz (112-168 g) of lean protein each day, such as lean meat, chicken, fish, eggs, or tofu. One ounce (oz) of lean protein is equal to: ? 1 oz (28 g) of meat, chicken, or fish. ? 1 egg. ?  cup (62 g) of tofu.  Eat some foods each day that contain healthy fats, such as avocado, nuts, seeds, and fish.   What foods should I eat? Fruits Berries. Apples. Oranges. Peaches. Apricots. Plums. Grapes. Mango. Papaya. Pomegranate. Kiwi. Cherries. Vegetables Lettuce. Spinach. Leafy greens, including kale, chard, collard greens, and mustard greens. Beets. Cauliflower. Cabbage. Broccoli. Carrots. Green beans. Tomatoes. Peppers. Onions. Cucumbers. Brussels sprouts. Grains Whole grains, such as whole-wheat or whole-grain bread, crackers, tortillas, cereal, and pasta. Unsweetened oatmeal. Quinoa. Brown or wild rice. Meats and other proteins Seafood. Poultry without skin. Lean cuts of poultry and beef. Tofu. Nuts. Seeds. Dairy Low-fat or fat-free dairy products such as milk, yogurt, and cheese. The items listed above may not be a complete list of foods and beverages you can eat. Contact a dietitian for more information. What foods should I avoid? Fruits Fruits canned with  syrup. Vegetables Canned vegetables. Frozen vegetables with butter or cream sauce. Grains Refined white flour and flour products such as bread, pasta, snack foods, and cereals. Avoid all processed foods. Meats and other proteins Fatty cuts of meat. Poultry with skin. Breaded or fried meats. Processed meat. Avoid saturated fats. Dairy Full-fat yogurt, cheese, or milk. Beverages Sweetened drinks, such as soda or iced tea. The items listed above may not be a complete list of foods and beverages you should avoid. Contact a dietitian for more information. Questions to ask a health care provider  Do I need to meet with a diabetes educator?  Do I need to meet with a dietitian?  What number can I call if I have questions?  When are the best times to check my blood glucose? Where to find more information:  American Diabetes Association: diabetes.org  Academy of Nutrition and Dietetics: www.eatright.org  National Institute of Diabetes and Digestive and Kidney Diseases: www.niddk.nih.gov  Association of Diabetes Care and Education Specialists: www.diabeteseducator.org Summary  It is important to have healthy eating   habits because your blood sugar (glucose) levels are greatly affected by what you eat and drink.  A healthy meal plan will help you control your blood glucose and maintain a healthy lifestyle.  Your health care provider may recommend that you work with a dietitian to make a meal plan that is best for you.  Keep in mind that carbohydrates (carbs) and alcohol have immediate effects on your blood glucose levels. It is important to count carbs and to use alcohol carefully. This information is not intended to replace advice given to you by your health care provider. Make sure you discuss any questions you have with your health care provider. Document Revised: 08/20/2019 Document Reviewed: 08/20/2019 Elsevier Patient Education  2021 [employer]vier Inc.  

## 2021-07-27 NOTE — Progress Notes (Signed)
Subjective:  Patient ID: Lisa Anderson, female    DOB: [birth year]  Age: 65 y.o. MRN: [id number]  CC: Diabetes   HPI Lisa Anderson is a 65 y.o. 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  Interval History: Complains of fatigue since she had Bronchitis and is using an OTC antitussive with some relief.  She still hears herself wheezing.  Seen at the ED 1 week ago  A1c is 10.5. Blood sugars are around 300, 340. She endorses non  adherence to a Diabetic diet. She does have Diabetic Neuropathy and is on gabapentin.  Also tolerating her statin.  Denies hypoglycemia, visual concerns.  She prefers Lexapro to Cymbalta for treatment of her anxiety and depression. With regards to her CHF she has no pedal edema, dyspnea, chest pain, orthopnea and has a good exercise tolerance.  Colonoscopy comes up on 08/26/21 Past Medical History:  Diagnosis Date   Asthma    Cancer [facility])    breast   Chronic congestive heart failure with left ventricular diastolic dysfunction ([place])    Diabetes mellitus    Hyperlipidemia    Hypertension    Stroke [facility])     Past Surgical History:  Procedure Laterality Date   BREAST BIOPSY Right 2007   BREAST LUMPECTOMY Right 2007   EP IMPLANTABLE DEVICE N/A 08/07/2015   Procedure: Loop Recorder Insertion;  Surgeon: Thompson Grayer, MD;  Location: [place] CV LAB;  Service: Cardiovascular;  Laterality: N/A;   TEE WITHOUT CARDIOVERSION N/A 08/07/2015   Procedure: TRANSESOPHAGEAL ECHOCARDIOGRAM (TEE);  Surgeon: Sueanne Margarita, MD;  Location: [facility] ENDOSCOPY;  Service: Cardiovascular;  Laterality: N/A;   TONSILLECTOMY     TUBAL LIGATION  1991    Family History  Problem Relation Age of Onset   Diabetes Mother     Hyperlipidemia Mother    Hypertension Mother    Breast cancer Mother    Rheum arthritis Father    Asthma Father    Stroke Father    Thyroid disease Father    Thyroid disease Brother    Colon cancer Brother        in his 76s   Breast cancer Sister    Cervical cancer Paternal Aunt     Allergies  Allergen Reactions   Prednisone Hives and Other (See Comments)    Made her "crazy", suicidal   Benicar [Olmesartan] Other (See Comments)    Did not work at all for patient   Celexa [Citalopram] Other (See Comments)    Made patient feel crazy, fluoxetine is fine   Effexor [Venlafaxine] Other (See Comments)    Made patient feel crazy   Shrimp [Shellfish Allergy] Hives and Swelling    Outpatient Medications Prior to Visit  Medication Sig Dispense Refill   albuterol (VENTOLIN HFA) 108 (90 Base) MCG/ACT inhaler Inhale 1-2 puffs into the lungs every 6 (six) hours as needed for wheezing or shortness of breath. 6.7 g 2   cloNIDine (CATAPRES) 0.1 MG tablet Take 1 tablet (0.1 mg total) by mouth 2 (two) times daily. 60 tablet 6   clopidogrel (PLAVIX) 75 MG tablet Take 1 tablet by mouth once daily 90 tablet 0   diltiazem (CARTIA XT) 120 MG 24 hr capsule Take 1 capsule (120 mg total)  by mouth daily. 30 capsule 2   gabapentin (NEURONTIN) 300 MG capsule Take 1 capsule (300 mg total) by mouth 3 (three) times daily. 90 capsule 1   gabapentin (NEURONTIN) 300 MG capsule TAKE 1 CAPSULE BY MOUTH THREE TIMES DAILY 90 capsule 0   glipiZIDE (GLUCOTROL) 10 MG tablet Take 1 tablet (10 mg total) by mouth 2 (two) times daily before a meal. OFFICE VISIT NEEDED FOR ADDITIONAL REFILLS 180 tablet 0   hydrALAZINE (APRESOLINE) 100 MG tablet Take 1 tablet by mouth twice daily 60 tablet 0   hydrochlorothiazide (HYDRODIURIL) 25 MG tablet Take 1 tablet (25 mg total) by mouth daily. 90 tablet 0   insulin aspart (NOVOLOG) 100 UNIT/ML injection USE PER SLIDING SCALE. MAX OF 9 UNITS 3 TIMES DAILY 10 mL 0   insulin  glargine-yfgn (SEMGLEE, YFGN,) 100 UNIT/ML injection Inject 0.2 mLs (20 Units total) into the skin daily. 10 mL 0   nicotine (NICODERM CQ) 7 mg/24hr patch Place 1 patch (7 mg total) onto the skin daily. 28 patch 3   omeprazole (PRILOSEC) 20 MG capsule Take 1 capsule (20 mg total) by mouth 2 (two) times daily before a meal. 30 capsule 0   potassium chloride SA (KLOR-CON) 20 MEQ tablet TAKE 1  BY MOUTH ONCE DAILY 90 tablet 0   rosuvastatin (CRESTOR) 40 MG tablet TAKE 1 TABLET BY MOUTH ONCE DAILY AT  6  PM 90 tablet 0   traZODone (DESYREL) 50 MG tablet Take 1 tablet (50 mg total) by mouth at bedtime as needed for sleep. 15 tablet 0   DULoxetine (CYMBALTA) 60 MG capsule Take 1 capsule (60 mg total) by mouth daily. (Patient not taking: Reported on 07/27/2021) 30 capsule 6   No facility-administered medications prior to visit.     ROS Review of Systems  Constitutional:  Negative for activity change, appetite change and fatigue.  HENT:  Negative for congestion, sinus pressure and sore throat.   Eyes:  Negative for visual disturbance.  Respiratory:  Negative for cough, chest tightness, shortness of breath and wheezing.   Cardiovascular:  Negative for chest pain and palpitations.  Gastrointestinal:  Negative for abdominal distention, abdominal pain and constipation.  Endocrine: Negative for polydipsia.  Genitourinary:  Negative for dysuria and frequency.  Musculoskeletal:  Negative for arthralgias and back pain.  Skin:  Negative for rash.  Neurological:  Negative for tremors, light-headedness and numbness.  Hematological:  Does not bruise/bleed easily.  Psychiatric/Behavioral:  Negative for agitation and behavioral problems.    Objective:  BP 135/79   Pulse 84   Ht '5\' 5"'  (1.651 m)   Wt 162 lb 8 oz (73.7 kg)   SpO2 98%   BMI 27.04 kg/m   BP/Weight 07/27/2021 0/76/8088 09/26/313  Systolic BP 945 859 292  Diastolic BP 79 74 72  Wt. (Lbs) 162.5 161 163  BMI 27.04 26.79 27.12  Some encounter  information is confidential and restricted. Go to Review Flowsheets activity to see all data.      Physical Exam Constitutional:      Appearance: She is well-developed.  Cardiovascular:     Rate and Rhythm: Normal rate.     Heart sounds: Normal heart sounds. No murmur heard. Pulmonary:     Effort: Pulmonary effort is normal.     Breath sounds: Normal breath sounds. No wheezing or rales.  Chest:     Chest wall: No tenderness.  Abdominal:     General: Bowel sounds are normal. There is no distension.  Palpations: Abdomen is soft. There is no mass.     Tenderness: There is no abdominal tenderness.  Musculoskeletal:        General: Normal range of motion.     Right lower leg: No edema.     Left lower leg: No edema.  Neurological:     Mental Status: She is alert and oriented to person, place, and time.  Psychiatric:        Mood and Affect: Mood normal.    CMP Latest Ref Rng & Units 05/27/2020 10/30/2019 09/25/2019  Glucose 65 - 99 mg/dL 300(H) 230(H) 276(H)  BUN 8 - 27 mg/dL 14 14 25(H)  Creatinine 0.57 - 1.00 mg/dL 1.18(H) 1.28(H) 1.58(H)  Sodium 134 - 144 mmol/L 139 141 136  Potassium 3.5 - 5.2 mmol/L 3.5 3.7 4.4  Chloride 96 - 106 mmol/L 97 100 99  CO2 20 - 29 mmol/L '28 27 29  ' Calcium 8.7 - 10.3 mg/dL 9.1 9.3 9.0  Total Protein 6.0 - 8.5 g/dL 7.4 - -  Total Bilirubin 0.0 - 1.2 mg/dL 0.3 - -  Alkaline Phos 48 - 121 IU/L 140(H) - -  AST 0 - 40 IU/L 14 - -  ALT 0 - 32 IU/L 11 - -    Lipid Panel     Component Value Date/Time   CHOL 138 05/27/2020 1012   TRIG 120 05/27/2020 1012   HDL 47 05/27/2020 1012   CHOLHDL 2.9 05/27/2020 1012   CHOLHDL 5.1 09/22/2019 1753   VLDL 37 09/22/2019 1753   LDLCALC 69 05/27/2020 1012    CBC    Component Value Date/Time   WBC 7.7 09/25/2019 1532   RBC 4.42 09/25/2019 1532   HGB 12.0 09/25/2019 1532   HGB 12.1 01/16/2019 1052   HCT 39.1 09/25/2019 1532   HCT 37.4 01/16/2019 1052   PLT 261 09/25/2019 1532   PLT 286 01/16/2019  1052   MCV 88.5 09/25/2019 1532   MCV 84 01/16/2019 1052   MCH 27.1 09/25/2019 1532   MCHC 30.7 09/25/2019 1532   RDW 13.4 09/25/2019 1532   RDW 13.4 01/16/2019 1052   LYMPHSABS 2.1 01/16/2019 1052   MONOABS 0.6 09/18/2018 1612   EOSABS 1.0 (H) 01/16/2019 1052   BASOSABS 0.1 01/16/2019 1052    Lab Results  Component Value Date   HGBA1C 10.5 (A) 07/27/2021    Assessment & Plan:  1. Type 2 diabetes mellitus with 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 gangrene, with long-term current use of insulin (HCC) Uncontrolled with A1c of 10.5 Goal is less than 7.0 Increased long-acting insulin from 20 to 27 Counseled on Diabetic diet, my plate method, 818 minutes of moderate intensity exercise/week Blood sugar logs with fasting goals of 80-120 mg/dl, random of less than 180 and in the event of sugars less than 60 mg/dl or greater than 400 mg/dl encouraged to notify the clinic. Advised on the need for annual eye exams, annual foot exams, Pneumonia vaccine. - POCT glucose (manual entry) - POCT glycosylated hemoglobin (Hb A1C) - insulin glargine-yfgn (SEMGLEE, YFGN,) 100 UNIT/ML injection; Inject 0.27 mLs (27 Units total) into the skin daily.  Dispense: 30 mL; Refill: 6 - CMP14+EGFR; Future - LP+Non-HDL Cholesterol; Future - Microalbumin / creatinine urine ratio; Future - insulin aspart (NOVOLOG) 100 UNIT/ML injection; USE PER SLIDING SCALE. MAX OF 9 UNITS 3 TIMES DAILY  Dispense: 10 mL; Refill: 3 - rosuvastatin (CRESTOR) 40 MG tablet; TAKE 1 TABLET BY MOUTH ONCE DAILY AT  6  PM  Dispense: 90 tablet;  Refill: 1  2. Tobacco abuse Spent 4 minutes counseling on smoking cessation and hazardous effects of smoking and she will be working on this. - nicotine (NICODERM CQ) 7 mg/24hr patch; Place 1 patch (7 mg total) onto the skin daily.  Dispense: 28 patch; Refill: 3  3. Diabetic nephropathy associated with type 2 diabetes mellitus (HCC) Stable on gabapentin - gabapentin (NEURONTIN) 300 MG capsule;  Take 2 capsules (600 mg total) by mouth 2 (two) times daily.  Dispense: 120 capsule; Refill: 6  4. History of stroke No residual deficits - clopidogrel (PLAVIX) 75 MG tablet; Take 1 tablet (75 mg total) by mouth daily.  Dispense: 90 tablet; Refill: 1  5. Hypertensive heart disease with chronic diastolic congestive heart failure (HCC) Euvolemic with EF of 60 to 65% Consider SGLT2 inhibitor at next visit - diltiazem (CARTIA XT) 120 MG 24 hr capsule; Take 1 capsule (120 mg total) by mouth daily.  Dispense: 30 capsule; Refill: 6 - potassium chloride SA (KLOR-CON) 20 MEQ tablet; TAKE 1  BY MOUTH ONCE DAILY  Dispense: 90 tablet; Refill: 1  6. Recurrent major depressive disorder, remission status unspecified ([place]) Previously on Cymbalta but previous Lexapro - escitalopram (LEXAPRO) 20 MG tablet; Take 1 tablet (20 mg total) by mouth daily.  Dispense: 30 tablet; Refill: 6   Health Care Maintenance: Has upcoming appointment for colonoscopy. No orders of the defined types were placed in this encounter.   Return in about 3 months (around 10/27/2021) for Diabetes.       Charlott Rakes, MD, FAAFP. [facility] and [facility], [place]   07/27/2021, 2:47 PM

## 2021-07-28 ENCOUNTER — Encounter: Payer: Self-pay | Admitting: Family Medicine

## 2021-07-28 ENCOUNTER — Other Ambulatory Visit: Payer: Self-pay

## 2021-07-28 MED ORDER — INSULIN GLARGINE-YFGN 100 UNIT/ML ~~LOC~~ SOLN
27.0000 [IU] | Freq: Every day | SUBCUTANEOUS | 6 refills | Status: DC
Start: 2021-07-28 — End: 2021-10-06

## 2021-08-03 ENCOUNTER — Other Ambulatory Visit: Payer: PRIVATE HEALTH INSURANCE

## 2021-08-04 ENCOUNTER — Other Ambulatory Visit: Payer: Self-pay | Admitting: Family Medicine

## 2021-08-04 DIAGNOSIS — I5032 Chronic diastolic (congestive) heart failure: Secondary | ICD-10-CM

## 2021-08-04 NOTE — Telephone Encounter (Signed)
Requested medications are due for refill today.  yes  Requested medications are on the active medications list.  yes  Last refill. 06/29/2021  Future visit scheduled.   yes  Notes to clinic.  Failed protocol d/t expired labs.

## 2021-08-04 NOTE — Telephone Encounter (Signed)
Medication Refill - Medication:  hydrALAZINE (APRESOLINE) 100 MG tablet   Has the patient contacted their pharmacy? Yes.   Contact PCP  Preferred Pharmacy (with phone number or street name):  [facility] [address], [place] [address]  Phone:  [phone]  Fax:  [phone]  Has the patient been seen for an appointment in the last year OR does the patient have an upcoming appointment? Yes.    Agent: Please be advised that RX refills may take up to 3 business days. We ask that you follow-up with your pharmacy.

## 2021-08-05 MED ORDER — HYDRALAZINE HCL 100 MG PO TABS: 100.0000 mg | ORAL_TABLET | Freq: Two times a day (BID) | ORAL | 1 refills | Status: AC

## 2021-08-24 ENCOUNTER — Other Ambulatory Visit: Payer: Self-pay

## 2021-08-26 ENCOUNTER — Other Ambulatory Visit: Payer: Self-pay

## 2021-08-27 ENCOUNTER — Telehealth: Payer: Self-pay | Admitting: Family Medicine

## 2021-08-27 NOTE — Telephone Encounter (Unsigned)
Copied from [place] [id number]. Topic: General - Other >> Aug 25, 2021  9:20 AM Tessa Lerner A wrote: Reason for CRM: Amy with [facility] has called to request cardio clearance to hold clopidogrel (PLAVIX) 75 MG tablet [[id number]]  for 5 days prior to patient's colonoscopy   Procedure is scheduled for 09/03/21  Please fax a note of clearance to [phone]  The patient's cardiologist deferred to Dr. Margarita Rana   Please contact further if needed

## 2021-08-27 NOTE — Telephone Encounter (Signed)
Routed to PCP 

## 2021-08-27 NOTE — Telephone Encounter (Signed)
Another call received to follow up on the clearance to hold Plavix, please advise.

## 2021-08-27 NOTE — Telephone Encounter (Signed)
Amy with [facility] has called to request cardio clearance to hold clopidogrel (PLAVIX) 75 MG tablet [[id number]]  for 5 days prior to patient's colonoscopy     Procedure is scheduled for 09/03/21    Please fax a note of clearance to [phone]    The patient's cardiologist deferred to Dr. Emily Fil[facility] has previously called twice and is request a response today. Pt colonspy is 09/03/21  [phone]

## 2021-08-30 NOTE — Telephone Encounter (Signed)
Patient can hold Plavix for 5 days in anticipation of procedure.  Letter has been faxed.

## 2021-08-30 NOTE — Telephone Encounter (Signed)
Letter has been faxed.

## 2021-09-08 ENCOUNTER — Other Ambulatory Visit: Payer: Self-pay

## 2021-09-22 ENCOUNTER — Other Ambulatory Visit: Payer: Self-pay | Admitting: Family Medicine

## 2021-09-22 ENCOUNTER — Other Ambulatory Visit: Payer: Self-pay

## 2021-09-22 DIAGNOSIS — I110 Hypertensive heart disease with heart failure: Secondary | ICD-10-CM

## 2021-09-22 DIAGNOSIS — I10 Essential (primary) hypertension: Secondary | ICD-10-CM

## 2021-10-06 ENCOUNTER — Other Ambulatory Visit: Payer: Self-pay

## 2021-10-06 ENCOUNTER — Telehealth: Payer: Self-pay | Admitting: Family Medicine

## 2021-10-06 DIAGNOSIS — E1151 Type 2 diabetes mellitus with 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 gangrene: Secondary | ICD-10-CM

## 2021-10-06 MED ORDER — INSULIN GLARGINE-YFGN 100 UNIT/ML ~~LOC~~ SOLN
27.0000 [IU] | Freq: Every day | SUBCUTANEOUS | 6 refills | Status: DC
  Filled 2021-10-06: qty 10, 37d supply, fill #0
  Filled 2021-10-29: qty 20, 74d supply, fill #0

## 2021-10-06 MED ORDER — INSULIN ASPART 100 UNIT/ML IJ SOLN
INTRAMUSCULAR | 3 refills | Status: AC
  Filled 2021-10-06: qty 10, 28d supply, fill #0
  Filled 2021-10-29: qty 10, 37d supply, fill #0

## 2021-10-06 NOTE — Telephone Encounter (Signed)
Pt wants her insuluin Rxs sent to a [facility], she says her Rxs are cheaper there. Please advise

## 2021-10-06 NOTE — Telephone Encounter (Signed)
Medication has been sent to [facility] pharmacy.

## 2021-10-07 ENCOUNTER — Other Ambulatory Visit: Payer: Self-pay

## 2021-10-08 ENCOUNTER — Other Ambulatory Visit: Payer: Self-pay

## 2021-10-14 ENCOUNTER — Other Ambulatory Visit: Payer: Self-pay

## 2021-10-15 ENCOUNTER — Other Ambulatory Visit: Payer: Self-pay

## 2021-10-27 ENCOUNTER — Encounter: Payer: Self-pay | Admitting: Family Medicine

## 2021-10-27 ENCOUNTER — Other Ambulatory Visit: Payer: Self-pay

## 2021-10-27 ENCOUNTER — Ambulatory Visit: Payer: PRIVATE HEALTH INSURANCE | Attending: Family Medicine | Admitting: Family Medicine

## 2021-10-27 DIAGNOSIS — E1121 Type 2 diabetes mellitus with diabetic nephropathy: Secondary | ICD-10-CM | POA: Diagnosis not present

## 2021-10-27 DIAGNOSIS — Z8673 Personal history of transient ischemic attack (TIA), and cerebral infarction without residual deficits: Secondary | ICD-10-CM

## 2021-10-27 DIAGNOSIS — E1151 Type 2 diabetes mellitus with 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 gangrene: Secondary | ICD-10-CM | POA: Diagnosis not present

## 2021-10-27 DIAGNOSIS — F339 Major depressive disorder, recurrent, unspecified: Secondary | ICD-10-CM | POA: Diagnosis not present

## 2021-10-27 DIAGNOSIS — I110 Hypertensive heart disease with heart failure: Secondary | ICD-10-CM | POA: Diagnosis not present

## 2021-10-27 DIAGNOSIS — Z794 Long term (current) use of insulin: Secondary | ICD-10-CM

## 2021-10-27 DIAGNOSIS — I5032 Chronic diastolic (congestive) heart failure: Secondary | ICD-10-CM

## 2021-10-27 NOTE — Progress Notes (Signed)
Virtual Visit via Telephone Note  I connected with Lisa Anderson, on 10/27/2021 at 3:11 PM by telephone due to the COVID-19 pandemic and verified that I am speaking with the correct person using two identifiers.   Consent: I discussed the limitations, risks, security and privacy concerns of performing an evaluation and management service by telephone and the availability of in person appointments. I also discussed with the patient that there may be a patient responsible charge related to this service. The patient expressed understanding and agreed to proceed.   Location of Patient: Home  Location of Provider: Clinic   Persons participating in Telemedicine visit: Lisa Anderson Dr. Margarita Rana     History of Present Illness: Lisa Anderson is a 66 y.o. 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  She was seen by GYN 1 year ago due to findings of ASCUS on her Pap smear and was advised to return in 3 weeks for colposcopy but she never did.  I have provided her with information to the [facility] so she can schedule her appointment.  Blood sugars have been 300, 350 and she endorses continuing to drink sodas but has also been compliant with her medications.  We have discussed the need to be compliant with diabetic diet on several occasions and she has always promised to do better but has not made any regimen changes. I ordered fasting labs for her but she never returned to do them.  Her BP has been 130/76.  She has no chest pain, dyspnea, pedal edema or weight gain.  She continues to smoke and I had  prescribed her nicotine patches at her last visit.  States she is working on it. Denies acute concerns today.  Past Medical History:  Diagnosis Date   Asthma    Cancer ([place])    breast   Chronic congestive heart failure with left ventricular diastolic dysfunction (HCC)    Diabetes mellitus    Hyperlipidemia    Hypertension    Stroke (HCC)    Allergies  Allergen Reactions   Prednisone Hives and Other (See Comments)    Made her "crazy", suicidal   Benicar [Olmesartan] Other (See Comments)    Did not work at all for patient   Celexa [Citalopram] Other (See Comments)    Made patient feel crazy, fluoxetine is fine   Effexor [Venlafaxine] Other (See Comments)    Made patient feel crazy   Shrimp [Shellfish Allergy] Hives and Swelling    Current Outpatient Medications on File Prior to Visit  Medication Sig Dispense Refill   albuterol (VENTOLIN HFA) 108 (90 Base) MCG/ACT inhaler Inhale 1-2 puffs into the lungs every 6 (six) hours as needed for wheezing or shortness of breath. 6.7 g 2   amoxicillin-clavulanate (AUGMENTIN) 875-125 MG tablet Take 1 tablet by mouth 2 (two) times daily. 20 tablet 0   cloNIDine (CATAPRES) 0.1 MG tablet Take 1 tablet by mouth twice daily 60 tablet 0   clopidogrel (PLAVIX) 75 MG tablet Take 1 tablet (75 mg total) by mouth daily. 90 tablet 1   diltiazem (CARTIA XT) 120 MG 24 hr  capsule Take 1 capsule (120 mg total) by mouth daily. 30 capsule 6   escitalopram (LEXAPRO) 20 MG tablet Take 1 tablet (20 mg total) by mouth daily. 30 tablet 6   gabapentin (NEURONTIN) 300 MG capsule Take 2 capsules (600 mg total) by mouth 2 (two) times daily. 120 capsule 6   glipiZIDE (GLUCOTROL) 10 MG tablet Take 1 tablet (10 mg total) by mouth 2 (two) times daily before a meal. OFFICE VISIT NEEDED FOR ADDITIONAL REFILLS 180 tablet 0   hydrALAZINE (APRESOLINE) 100 MG tablet Take 1 tablet (100 mg total) by mouth 2 (two) times daily. 180 tablet 1   hydrochlorothiazide (HYDRODIURIL) 25 MG  tablet Take 1 tablet (25 mg total) by mouth daily. 90 tablet 0   hydrochlorothiazide (MICROZIDE) 12.5 MG capsule Take 1 capsule by mouth once daily 30 capsule 0   insulin aspart (NOVOLOG) 100 UNIT/ML injection USE PER SLIDING SCALE. MAX OF 9 UNITS 3 TIMES DAILY 10 mL 3   insulin glargine-yfgn (SEMGLEE, YFGN,) 100 UNIT/ML injection Inject 0.27 mLs (27 Units total) into the skin daily. 30 mL 6   nicotine (NICODERM CQ) 7 mg/24hr patch Place 1 patch (7 mg total) onto the skin daily. 28 patch 3   omeprazole (PRILOSEC) 20 MG capsule Take 1 capsule (20 mg total) by mouth 2 (two) times daily before a meal. 30 capsule 6   potassium chloride SA (KLOR-CON) 20 MEQ tablet TAKE 1  BY MOUTH ONCE DAILY 90 tablet 1   rosuvastatin (CRESTOR) 40 MG tablet TAKE 1 TABLET BY MOUTH ONCE DAILY AT  6  PM 90 tablet 1   traZODone (DESYREL) 50 MG tablet Take 1 tablet (50 mg total) by mouth at bedtime as needed for sleep. 15 tablet 0   No current facility-administered medications on file prior to visit.    ROS: See HPI  Observations/Objective: Awake, alert, oriented x3 Not in acute distress Normal mood   CMP Latest Ref Rng & Units 05/27/2020 10/30/2019 09/25/2019  Glucose 65 - 99 mg/dL 300(H) 230(H) 276(H)  BUN 8 - 27 mg/dL 14 14 25(H)  Creatinine 0.57 - 1.00 mg/dL 1.18(H) 1.28(H) 1.58(H)  Sodium 134 - 144 mmol/L 139 141 136  Potassium 3.5 - 5.2 mmol/L 3.5 3.7 4.4  Chloride 96 - 106 mmol/L 97 100 99  CO2 20 - 29 mmol/L 28 27 29   Calcium 8.7 - 10.3 mg/dL 9.1 9.3 9.0  Total Protein 6.0 - 8.5 g/dL 7.4 - -  Total Bilirubin 0.0 - 1.2 mg/dL 0.3 - -  Alkaline Phos 48 - 121 IU/L 140(H) - -  AST 0 - 40 IU/L 14 - -  ALT 0 - 32 IU/L 11 - -    Lipid Panel     Component Value Date/Time   CHOL 138 05/27/2020 1012   TRIG 120 05/27/2020 1012   HDL 47 05/27/2020 1012   CHOLHDL 2.9 05/27/2020 1012   CHOLHDL 5.1 09/22/2019 1753   VLDL 37 09/22/2019 1753   LDLCALC 69 05/27/2020 1012   LABVLDL 22 05/27/2020 1012    Lab  Results  Component Value Date   HGBA1C 10.5 (A) 07/27/2021    Assessment and Plan: 1. Type 2 diabetes mellitus with 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 gangrene, with long-term current use of insulin (HCC) Uncontrolled with A1c of 10.5; goal is less than 7.0 Noncompliance with diet and lifestyle modifications playing a huge role Will send of A1c and adjust regimen accordingly Strongly counseled on the need to be compliant and we have discussed all  the possible complications of diabetes which can be worsened by her noncompliance and she promises to do better Counseled on Diabetic diet, my plate method, 329 minutes of moderate intensity exercise/week Blood sugar logs with fasting goals of 80-120 mg/dl, random of less than 180 and in the event of sugars less than 60 mg/dl or greater than 400 mg/dl encouraged to notify the clinic. Advised on the need for annual eye exams, annual foot exams, Pneumonia vaccine. - Hemoglobin A1c; Future  2. Diabetic nephropathy associated with type 2 diabetes mellitus ([place]) Secondary to uncontrolled diabetes mellitus Adequate glycemic control is imperative Will check microalbumin  3. History of stroke Risk factor modification is imperative unfortunately she continues to smoke and has been nonchalant with regards to her diabetes control Continue Plavix   4. Hypertensive heart disease with chronic diastolic congestive heart failure ([place]) EF of 60-65 in 2019 Euvolemic Consider addition of Jardiance at next visit  5. Recurrent major depressive disorder, remission status unspecified ([place]) Controlled on Lexapro   Follow Up Instructions: 3 months   I discussed the assessment and treatment plan with the patient. The patient was provided an opportunity to ask questions and all were answered. The patient agreed with the plan and demonstrated an understanding of the instructions.   The patient was advised to call back or seek an in-person evaluation if the  symptoms worsen or if the condition fails to improve as anticipated.     I provided 16 minutes total of non-face-to-face time during this encounter.   Charlott Rakes, MD, FAAFP. [facility] and [facility] [place], [place]   10/27/2021, 3:11 PM

## 2021-10-29 ENCOUNTER — Other Ambulatory Visit: Payer: Self-pay

## 2021-10-29 ENCOUNTER — Ambulatory Visit: Payer: Self-pay | Attending: Family Medicine

## 2021-10-29 DIAGNOSIS — Z794 Long term (current) use of insulin: Secondary | ICD-10-CM

## 2021-10-30 LAB — LP+NON-HDL CHOLESTEROL
Cholesterol, Total: 163 mg/dL (ref 100–199)
HDL: 51 mg/dL (ref >39)
LDL Chol Calc (NIH): 92 mg/dL (ref 0–99)
Total Non-HDL-Chol (LDL+VLDL): 112 mg/dL (ref 0–129)
Triglycerides: 108 mg/dL (ref 0–149)
VLDL Cholesterol Cal: 20 mg/dL (ref 5–40)

## 2021-10-30 LAB — HEMOGLOBIN A1C
Est. average glucose Bld gHb Est-mCnc: 232 mg/dL
Hgb A1c MFr Bld: 9.7 % — ABNORMAL HIGH (ref 4.8–5.6)

## 2021-11-01 ENCOUNTER — Other Ambulatory Visit: Payer: Self-pay | Admitting: Family Medicine

## 2021-11-01 ENCOUNTER — Other Ambulatory Visit: Payer: Self-pay

## 2021-11-01 DIAGNOSIS — Z794 Long term (current) use of insulin: Secondary | ICD-10-CM

## 2021-11-01 MED ORDER — INSULIN GLARGINE-YFGN 100 UNIT/ML ~~LOC~~ SOLN: 35.0000 [IU] | Freq: Every day | SUBCUTANEOUS | 6 refills | Status: AC

## 2021-11-02 ENCOUNTER — Other Ambulatory Visit: Payer: Self-pay | Admitting: Family Medicine

## 2021-11-02 DIAGNOSIS — I110 Hypertensive heart disease with heart failure: Secondary | ICD-10-CM

## 2021-11-02 DIAGNOSIS — I5032 Chronic diastolic (congestive) heart failure: Secondary | ICD-10-CM

## 2021-11-02 DIAGNOSIS — I10 Essential (primary) hypertension: Secondary | ICD-10-CM

## 2021-11-02 NOTE — Telephone Encounter (Signed)
Microzide failed lab with date. Requested Prescriptions  Pending Prescriptions Disp Refills   hydrochlorothiazide (MICROZIDE) 12.5 MG capsule [Pharmacy Med Name: hydroCHLOROthiazide 12.5 MG Oral Capsule] 30 capsule 0    Sig: Take 1 capsule by mouth once daily     Cardiovascular: Diuretics - Thiazide Failed - 11/02/2021  3:20 PM      Failed - Cr in normal range and within 180 days    Creatinine, Ser  Date Value Ref Range Status  05/27/2020 1.18 (H) 0.57 - 1.00 mg/dL Final   Creatinine, Urine  Date Value Ref Range Status  05/22/2017 18.63 mg/dL Final    Comment:    Performed at [facility], [place] [address] [place], [place] [address]         Failed - K in normal range and within 180 days    Potassium  Date Value Ref Range Status  05/27/2020 3.5 3.5 - 5.2 mmol/L Final         Failed - Na in normal range and within 180 days    Sodium  Date Value Ref Range Status  05/27/2020 139 134 - 144 mmol/L Final         Passed - Last BP in normal range    BP Readings from Last 1 Encounters:  07/27/21 135/79         Passed - Valid encounter within last 6 months    Recent Outpatient Visits          6 days ago Type 2 diabetes mellitus with 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 gangrene, with long-term current use of insulin ([place])   [place], [place], MD   3 months ago Type 2 diabetes mellitus with 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 gangrene, with long-term current use of insulin ([place])   [facility] [place], [place], MD   1 year ago Recurrent major depressive disorder, remission status unspecified ([place])   [place], [place], MD   1 year ago Type 2 diabetes mellitus with 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 gangrene, with long-term current use of insulin ([place])   [place], Charlane Ferretti, MD   1 year ago Colon cancer screening   [place], Charlane Ferretti, MD      Future Appointments            In 2 months Charlott Rakes, MD [place]            cloNIDine (CATAPRES) 0.1 MG tablet [Pharmacy Med Name: cloNIDine HCl 0.1 MG Oral Tablet] 60 tablet 2    Sig: Take 1 tablet by mouth twice daily     Cardiovascular:  Alpha-2 Agonists Passed - 11/02/2021  3:20 PM      Passed - Last BP in normal range    BP Readings from Last 1 Encounters:  07/27/21 135/79         Passed - Last Heart Rate in normal range    Pulse Readings from Last 1 Encounters:  07/27/21 84         Passed - Valid encounter within last 6 months    Recent Outpatient Visits          6 days ago Type 2 diabetes mellitus with 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 gangrene, with long-term current use of insulin ([place])   [place], Charlane Ferretti, MD   3 months ago Type 2  diabetes mellitus with 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 gangrene, with long-term current use of insulin ([place])   [facility] [place], [place], MD   1 year ago Recurrent major depressive disorder, remission status unspecified ([place])   [place], Charlane Ferretti, MD   1 year ago Type 2 diabetes mellitus with 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 gangrene, with long-term current use of insulin ([place])   [place], Enobong, MD   1 year ago Colon cancer screening   [place], MD      Future Appointments            In 2 months Charlott Rakes, MD [place]

## 2021-11-03 NOTE — Telephone Encounter (Signed)
Patient has two doses of HCTZ listed on her profile but has been taking the 12.5 mg dose. Will route to provider to approve if appropriate.

## 2021-11-03 NOTE — Addendum Note (Signed)
Addended by: Daisy Blossom, Annie Main L on: 11/03/2021 05:39 PM   Modules accepted: Orders

## 2021-11-04 ENCOUNTER — Other Ambulatory Visit: Payer: Self-pay

## 2021-11-04 ENCOUNTER — Other Ambulatory Visit: Payer: Self-pay | Admitting: Family Medicine

## 2021-11-04 DIAGNOSIS — Z794 Long term (current) use of insulin: Secondary | ICD-10-CM

## 2021-11-04 DIAGNOSIS — E1151 Type 2 diabetes mellitus with 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 gangrene: Secondary | ICD-10-CM

## 2021-11-05 ENCOUNTER — Telehealth: Payer: Self-pay

## 2021-11-05 NOTE — Telephone Encounter (Signed)
Patient name and DOB has been verified Patient was informed of lab results. Patient had no questions.  

## 2021-11-09 ENCOUNTER — Telehealth: Payer: Self-pay | Admitting: Family Medicine

## 2021-11-09 NOTE — Telephone Encounter (Signed)
I spoke to the patient on the phone and she informs me she is on 12.5 mg of hydrochlorothiazide.  Unfortunately her most recent labs that were done did not include a comprehensive metabolic panel even though I had ordered this previously.  I notified her to come into the clinic to have this done after which I will refill her hydrochlorothiazide.  She is also on potassium 20 mill equivalents daily

## 2021-11-15 ENCOUNTER — Telehealth: Payer: Self-pay | Admitting: Family Medicine

## 2021-11-16 NOTE — Telephone Encounter (Signed)
Erroneous encounter

## 2021-12-08 ENCOUNTER — Other Ambulatory Visit: Payer: Self-pay

## 2021-12-08 ENCOUNTER — Ambulatory Visit: Payer: PRIVATE HEALTH INSURANCE | Attending: Family Medicine

## 2021-12-09 ENCOUNTER — Other Ambulatory Visit: Payer: Self-pay | Admitting: Family Medicine

## 2021-12-09 LAB — CMP14+EGFR
ALT: 10 IU/L (ref 0–32)
AST: 14 IU/L (ref 0–40)
Albumin/Globulin Ratio: 1.4 (ref 1.2–2.2)
Albumin: 4.2 g/dL (ref 3.8–4.8)
Alkaline Phosphatase: 117 IU/L (ref 44–121)
BUN/Creatinine Ratio: 15 (ref 12–28)
BUN: 15 mg/dL (ref 8–27)
Bilirubin Total: 0.3 mg/dL (ref 0.0–1.2)
CO2: 26 mmol/L (ref 20–29)
Calcium: 9.1 mg/dL (ref 8.7–10.3)
Chloride: 102 mmol/L (ref 96–106)
Creatinine, Ser: 0.99 mg/dL (ref 0.57–1.00)
Globulin, Total: 2.9 g/dL (ref 1.5–4.5)
Glucose: 211 mg/dL — ABNORMAL HIGH (ref 70–99)
Potassium: 4 mmol/L (ref 3.5–5.2)
Sodium: 141 mmol/L (ref 134–144)
Total Protein: 7.1 g/dL (ref 6.0–8.5)
eGFR: 64 mL/min/{1.73_m2} (ref >59)

## 2021-12-09 MED ORDER — HYDROCHLOROTHIAZIDE 12.5 MG PO CAPS: 12.5000 mg | ORAL_CAPSULE | Freq: Every day | ORAL | 1 refills | Status: AC

## 2022-01-26 ENCOUNTER — Ambulatory Visit: Payer: PRIVATE HEALTH INSURANCE | Admitting: Family Medicine

## 2022-02-16 ENCOUNTER — Ambulatory Visit: Payer: Self-pay | Admitting: *Deleted

## 2022-02-16 NOTE — Telephone Encounter (Signed)
Call placed to patient to offer appointment with PCP no answer VM was left.

## 2022-02-16 NOTE — Telephone Encounter (Signed)
  Chief Complaint: Upper abdominal pain Symptoms: Right sided near ribs, 9/10 radiates to back, tender to touch. Nausea, vomiting "Phlegm."  Worse after eating Frequency: 2 days ago Pertinent Negatives: Patient denies  Disposition: '[x]'$ ED /'[]'$ Urgent Care (no appt availability in office) / '[]'$ Appointment(In office/virtual)/ '[]'$  [facility]/ '[]'$ Home Care/ '[]'$ Refused Recommended Disposition /'[]'$ [place] [facility]/ '[]'$  Follow-up with PCP Additional Notes: Pt sounds distressed. Advised ED, states will follow disposition. Reason for Disposition  [1] SEVERE pain (e.g., excruciating) AND [2] present > 1 hour  Answer Assessment - Initial Assessment Questions 1. LOCATION: "Where does it hurt?"      Upper abdomen right side 2. RADIATION: "Does the pain shoot anywhere else?" (e.g., chest, back)     Radiates to back 3. ONSET: "When did the pain begin?" (e.g., minutes, hours or days ago)       2 days ago 4. SUDDEN: "Gradual or sudden onset?"     Gradual 5. PATTERN "Does the pain come and go, or is it constant?"    - If constant: "Is it getting better, staying the same, or worsening?"      (Note: Constant means the pain never goes away completely; most serious pain is constant and it progresses)     - If intermittent: "How long does it last?" "Do you have pain now?"     (Note: Intermittent means the pain goes away completely between bouts)     Constant 6. SEVERITY: "How bad is the pain?"  (e.g., Scale 1-10; mild, moderate, or severe)    - MILD (1-3): doesn't interfere with normal activities, abdomen soft and not tender to touch     - MODERATE (4-7): interferes with normal activities or awakens from sleep, abdomen tender to touch     - SEVERE (8-10): excruciating pain, doubled over, unable to do any normal activities       9/10 7. RECURRENT SYMPTOM: "Have you ever had this type of stomach pain before?" If Yes, ask: "When was the last time?" and "What happened that time?"      no 8.  AGGRAVATING FACTORS: "Does anything seem to cause this pain?" (e.g., foods, stress, alcohol)     After eating 9. CARDIAC SYMPTOMS: "Do you have any of the following symptoms: chest pain, difficulty breathing, sweating, nausea?"      10. OTHER SYMPTOMS: "Do you have any other symptoms?" (e.g., back pain, diarrhea, fever, urination pain, vomiting)     Nausea, vomiting "Like phlegm."  Protocols used: Abdominal Pain - Upper-A-AH

## 2022-03-02 ENCOUNTER — Other Ambulatory Visit: Payer: Self-pay | Admitting: Family Medicine

## 2022-03-02 DIAGNOSIS — I110 Hypertensive heart disease with heart failure: Secondary | ICD-10-CM

## 2022-03-02 NOTE — Telephone Encounter (Signed)
Requested Prescriptions  Pending Prescriptions Disp Refills  . cloNIDine (CATAPRES) 0.1 MG tablet [Pharmacy Med Name: cloNIDine HCl 0.1 MG Oral Tablet] 180 tablet 1    Sig: Take 1 tablet by mouth twice daily     Cardiovascular:  Alpha-2 Agonists Failed - 03/02/2022  2:04 PM      Failed - Valid encounter within last 6 months    Recent Outpatient Visits          4 months ago Type 2 diabetes mellitus with 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 gangrene, with long-term current use of insulin ([place])   [place], Corinne, MD   7 months ago Type 2 diabetes mellitus with 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 gangrene, with long-term current use of insulin ([place])   [facility] [place], Coyle, MD   1 year ago Recurrent major depressive disorder, remission status unspecified ([place])   [place], [place], MD   1 year ago Type 2 diabetes mellitus with 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 gangrene, with long-term current use of insulin ([place])   [place], Enobong, MD   1 year ago Colon cancer screening   [place], Enobong, MD      Future Appointments            In 3 months Charlott Rakes, MD [place] - Last BP in normal range    BP Readings from Last 1 Encounters:  07/27/21 135/79         Passed - Last Heart Rate in normal range    Pulse Readings from Last 1 Encounters:  07/27/21 84

## 2022-03-16 ENCOUNTER — Other Ambulatory Visit: Payer: Self-pay | Admitting: Family Medicine

## 2022-03-16 DIAGNOSIS — F339 Major depressive disorder, recurrent, unspecified: Secondary | ICD-10-CM

## 2022-05-18 ENCOUNTER — Other Ambulatory Visit: Payer: Self-pay | Admitting: Family Medicine

## 2022-05-18 DIAGNOSIS — I110 Hypertensive heart disease with heart failure: Secondary | ICD-10-CM

## 2022-05-18 DIAGNOSIS — E1121 Type 2 diabetes mellitus with diabetic nephropathy: Secondary | ICD-10-CM

## 2022-06-07 ENCOUNTER — Ambulatory Visit: Payer: PRIVATE HEALTH INSURANCE | Admitting: Family Medicine

## 2022-06-14 ENCOUNTER — Other Ambulatory Visit: Payer: Self-pay | Admitting: Family Medicine

## 2022-06-14 DIAGNOSIS — F339 Major depressive disorder, recurrent, unspecified: Secondary | ICD-10-CM

## 2022-06-20 ENCOUNTER — Other Ambulatory Visit: Payer: Self-pay | Admitting: Family Medicine

## 2022-06-20 DIAGNOSIS — E1121 Type 2 diabetes mellitus with diabetic nephropathy: Secondary | ICD-10-CM

## 2022-06-21 NOTE — Telephone Encounter (Signed)
Requested Prescriptions  Pending Prescriptions Disp Refills  . gabapentin (NEURONTIN) 300 MG capsule [Pharmacy Med Name: Gabapentin 300 MG Oral Capsule] 120 capsule 2    Sig: Take 2 capsules by mouth twice daily     Neurology: Anticonvulsants - gabapentin Passed - 06/20/2022  3:39 PM      Passed - Cr in normal range and within 360 days    Creatinine, Ser  Date Value Ref Range Status  12/08/2021 0.99 0.57 - 1.00 mg/dL Final   Creatinine, Urine  Date Value Ref Range Status  05/22/2017 18.63 mg/dL Final    Comment:    Performed at [facility], [place] [address], [place], [place] [address]         Passed - Completed PHQ-2 or PHQ-9 in the last 360 days      Passed - Valid encounter within last 12 months    Recent Outpatient Visits          7 months ago Type 2 diabetes mellitus with 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 gangrene, with long-term current use of insulin ([place])   [place], [place], MD   10 months ago Type 2 diabetes mellitus with 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 gangrene, with long-term current use of insulin ([place])   [facility] [place], Charlane Ferretti, MD   1 year ago Recurrent major depressive disorder, remission status unspecified ([place])   [place], Charlane Ferretti, MD   2 years ago Type 2 diabetes mellitus with 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 gangrene, with long-term current use of insulin ([place])   [place], Enobong, MD   2 years ago Colon cancer screening   [place], Enobong, MD      Future Appointments            In 2 months Charlott Rakes, MD [place]

## 2022-06-24 ENCOUNTER — Other Ambulatory Visit: Payer: Self-pay | Admitting: Family Medicine

## 2022-06-24 DIAGNOSIS — I110 Hypertensive heart disease with heart failure: Secondary | ICD-10-CM

## 2022-06-24 NOTE — Telephone Encounter (Signed)
Requested medication (s) are due for refill today: yes  Requested medication (s) are on the active medication list:yes  Last refill:  05/18/22  Future visit scheduled: yes  Notes to clinic:  Unable to refill per protocol, courtesy refill already given, routing for provider approval. Patient has future OV scheduled.     Requested Prescriptions  Pending Prescriptions Disp Refills   diltiazem (CARDIZEM CD) 120 MG 24 hr capsule [Pharmacy Med Name: dilTIAZem HCl ER Coated Beads 120 MG Oral Capsule Extended Release 24 Hour] 90 capsule 0    Sig: TAKE 1 CAPSULE BY MOUTH ONCE DAILY (VISIT DOCTOR FOR FURTHER REFILLS)     Cardiovascular: Calcium Channel Blockers 3 Failed - 06/24/2022 10:52 AM      Failed - Valid encounter within last 6 months    Recent Outpatient Visits           8 months ago Type 2 diabetes mellitus with 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 gangrene, with long-term current use of insulin ([place])   [place], [place], MD   11 months ago Type 2 diabetes mellitus with 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 gangrene, with long-term current use of insulin ([place])   [facility] [place], [place], MD   1 year ago Recurrent major depressive disorder, remission status unspecified ([place])   [place], [place], MD   2 years ago Type 2 diabetes mellitus with 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 gangrene, with long-term current use of insulin ([place])   [place], [place], MD   2 years ago Colon cancer screening   [place], Charlane Ferretti, MD       Future Appointments             In 2 months Charlott Rakes, MD [place] - ALT in normal range and within 360 days    ALT  Date Value Ref Range Status  12/08/2021 10 0 - 32 IU/L Final         Passed - AST in  normal range and within 360 days    AST  Date Value Ref Range Status  12/08/2021 14 0 - 40 IU/L Final         Passed - Cr in normal range and within 360 days    Creatinine, Ser  Date Value Ref Range Status  12/08/2021 0.99 0.57 - 1.00 mg/dL Final   Creatinine, Urine  Date Value Ref Range Status  05/22/2017 18.63 mg/dL Final    Comment:    Performed at [facility], [address], [place], [place] [address]         Passed - Last BP in normal range    BP Readings from Last 1 Encounters:  07/27/21 135/79         Passed - Last Heart Rate in normal range    Pulse Readings from Last 1 Encounters:  07/27/21 84

## 2022-07-18 ENCOUNTER — Other Ambulatory Visit: Payer: Self-pay | Admitting: Family Medicine

## 2022-07-18 DIAGNOSIS — I110 Hypertensive heart disease with heart failure: Secondary | ICD-10-CM

## 2022-08-12 ENCOUNTER — Other Ambulatory Visit: Payer: Self-pay | Admitting: Internal Medicine

## 2022-08-12 DIAGNOSIS — I110 Hypertensive heart disease with heart failure: Secondary | ICD-10-CM

## 2022-08-12 NOTE — Telephone Encounter (Signed)
Requested Prescriptions  Pending Prescriptions Disp Refills   potassium chloride SA (KLOR-CON M) 20 MEQ tablet [Pharmacy Med Name: Potassium Chloride Crys ER 20 MEQ Oral Tablet Extended Release] 90 tablet 0    Sig: Take 1 tablet by mouth once daily     Endocrinology:  Minerals - Potassium Supplementation Failed - 08/12/2022 11:30 AM      Failed - Valid encounter within last 12 months    Recent Outpatient Visits           9 months ago Type 2 diabetes mellitus with 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 gangrene, with long-term current use of insulin ([place])   [place], [place], MD   1 year ago Type 2 diabetes mellitus with 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 gangrene, with long-term current use of insulin ([place])   [facility] [place], [place], MD   1 year ago Recurrent major depressive disorder, remission status unspecified ([place])   [place], [place], MD   2 years ago Type 2 diabetes mellitus with 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 gangrene, with long-term current use of insulin ([place])   [place], Charlane Ferretti, MD   2 years ago Colon cancer screening   [place], [place], MD       Future Appointments             In 1 month [place], Charlane Ferretti, MD [place] - K in normal range and within 360 days    Potassium  Date Value Ref Range Status  12/08/2021 4.0 3.5 - 5.2 mmol/L Final         Passed - Cr in normal range and within 360 days    Creatinine, Ser  Date Value Ref Range Status  12/08/2021 0.99 0.57 - 1.00 mg/dL Final   Creatinine, Urine  Date Value Ref Range Status  05/22/2017 18.63 mg/dL Final    Comment:    Performed at [facility], [place] [address], [place], [place] [address]

## 2022-08-22 ENCOUNTER — Other Ambulatory Visit: Payer: Self-pay | Admitting: Family Medicine

## 2022-08-22 DIAGNOSIS — Z8673 Personal history of transient ischemic attack (TIA), and cerebral infarction without residual deficits: Secondary | ICD-10-CM

## 2022-09-08 ENCOUNTER — Other Ambulatory Visit: Payer: Self-pay | Admitting: Family Medicine

## 2022-09-08 DIAGNOSIS — F339 Major depressive disorder, recurrent, unspecified: Secondary | ICD-10-CM

## 2022-09-12 ENCOUNTER — Ambulatory Visit: Payer: PRIVATE HEALTH INSURANCE | Admitting: Family Medicine

## 2022-10-10 ENCOUNTER — Other Ambulatory Visit: Payer: Self-pay | Admitting: Family Medicine

## 2022-10-10 DIAGNOSIS — Z8673 Personal history of transient ischemic attack (TIA), and cerebral infarction without residual deficits: Secondary | ICD-10-CM

## 2022-10-10 DIAGNOSIS — Z720 Tobacco use: Secondary | ICD-10-CM

## 2022-10-10 NOTE — Telephone Encounter (Signed)
Spoke with patient  to schedule an appointment for medication refill patient voices she in under the care of another Doctor. Advised patient  to contact  current MD's office for her refills.

## 2022-12-02 ENCOUNTER — Other Ambulatory Visit: Payer: Self-pay | Admitting: Family Medicine

## 2022-12-02 DIAGNOSIS — I110 Hypertensive heart disease with heart failure: Secondary | ICD-10-CM

## 2022-12-05 ENCOUNTER — Ambulatory Visit: Payer: PRIVATE HEALTH INSURANCE | Admitting: Family Medicine

## 2023-01-23 ENCOUNTER — Other Ambulatory Visit: Payer: Self-pay

## 2023-11-13 ENCOUNTER — Telehealth: Payer: Self-pay

## 2023-11-13 NOTE — Transitions of Care (Post Inpatient/ED Visit) (Signed)
   11/13/2023  Name: Raechel Marcos MRN: [id number] DOB: [birth year]  Today's TOC FU Call Status: Today's TOC FU Call Status:: Unsuccessful Call (1st Attempt) Unsuccessful Call (1st Attempt) Date: 11/13/23  Attempted to reach the patient regarding the most recent Inpatient/ED visit.  Follow Up Plan: Additional outreach attempts will be made to reach the patient to complete the Transitions of Care (Post Inpatient/ED visit) call.   Alyse Low, RN, BA, [facility], CRRN [facility] Coordinator, Transition of Care Ph # [phone]

## 2023-11-14 ENCOUNTER — Telehealth: Payer: Self-pay

## 2023-11-14 NOTE — Transitions of Care (Post Inpatient/ED Visit) (Signed)
   11/14/2023  Name: Lisa Anderson MRN: [id number] DOB: [birth year]  Today's TOC FU Call Status: Today's TOC FU Call Status:: Unsuccessful Call (2nd Attempt) Unsuccessful Call (2nd Attempt) Date: 11/14/23  Attempted to reach the patient regarding the most recent Inpatient/ED visit.  Follow Up Plan: No further outreach attempts will be made at this time. We have been unable to contact the patient.  Alyse Low, RN, BA, [facility], CRRN [facility] Coordinator, Transition of Care Ph # [phone]

## 2023-11-15 ENCOUNTER — Telehealth: Payer: Self-pay

## 2023-11-15 NOTE — Transitions of Care (Post Inpatient/ED Visit) (Signed)
   11/15/2023  Name: Lisa Anderson MRN: [id number] DOB: 11-12-1955  Today's TOC FU Call Status: Today's TOC FU Call Status:: Unsuccessful Call (3rd Attempt) Unsuccessful Call (3rd Attempt) Date: 11/15/23  Attempted to reach the patient regarding the most recent Inpatient/ED visit.  Follow Up Plan: No further outreach attempts will be made at this time. We have been unable to contact the patient.  Maxon Kresse A. Mliss Fritz RN, BA, [facility], CRRN [facility] RN Care Manager, Transition of Care [phone]
